# Patient Record
Sex: Male | Born: 1960 | Race: White | Hispanic: No | Marital: Married | Smoking: Former smoker
Health system: Southern US, Community
[De-identification: ages and names within clinical notes are randomized; demographics above are authoritative.]

## PROBLEM LIST (undated history)

## (undated) DIAGNOSIS — B1920 Unspecified viral hepatitis C without hepatic coma: Secondary | ICD-10-CM

## (undated) DIAGNOSIS — R2689 Other abnormalities of gait and mobility: Secondary | ICD-10-CM

## (undated) DIAGNOSIS — E039 Hypothyroidism, unspecified: Secondary | ICD-10-CM

## (undated) DIAGNOSIS — K7460 Unspecified cirrhosis of liver: Secondary | ICD-10-CM

## (undated) DIAGNOSIS — J9621 Acute and chronic respiratory failure with hypoxia: Secondary | ICD-10-CM

## (undated) DIAGNOSIS — K7210 Chronic hepatic failure without coma: Secondary | ICD-10-CM

## (undated) DIAGNOSIS — Z8673 Personal history of transient ischemic attack (TIA), and cerebral infarction without residual deficits: Secondary | ICD-10-CM

## (undated) DIAGNOSIS — I219 Acute myocardial infarction, unspecified: Secondary | ICD-10-CM

## (undated) DIAGNOSIS — G8929 Other chronic pain: Secondary | ICD-10-CM

## (undated) DIAGNOSIS — K56600 Partial intestinal obstruction, unspecified as to cause: Secondary | ICD-10-CM

## (undated) DIAGNOSIS — R293 Abnormal posture: Secondary | ICD-10-CM

## (undated) DIAGNOSIS — J449 Chronic obstructive pulmonary disease, unspecified: Secondary | ICD-10-CM

## (undated) DIAGNOSIS — M545 Low back pain, unspecified: Secondary | ICD-10-CM

## (undated) DIAGNOSIS — Z9289 Personal history of other medical treatment: Secondary | ICD-10-CM

## (undated) DIAGNOSIS — M1990 Unspecified osteoarthritis, unspecified site: Secondary | ICD-10-CM

## (undated) DIAGNOSIS — B182 Chronic viral hepatitis C: Secondary | ICD-10-CM

## (undated) DIAGNOSIS — Z9981 Dependence on supplemental oxygen: Secondary | ICD-10-CM

## (undated) DIAGNOSIS — F32A Depression, unspecified: Secondary | ICD-10-CM

## (undated) DIAGNOSIS — J45901 Unspecified asthma with (acute) exacerbation: Secondary | ICD-10-CM

## (undated) DIAGNOSIS — G43909 Migraine, unspecified, not intractable, without status migrainosus: Secondary | ICD-10-CM

## (undated) DIAGNOSIS — I2699 Other pulmonary embolism without acute cor pulmonale: Secondary | ICD-10-CM

## (undated) DIAGNOSIS — E538 Deficiency of other specified B group vitamins: Secondary | ICD-10-CM

## (undated) DIAGNOSIS — K922 Gastrointestinal hemorrhage, unspecified: Secondary | ICD-10-CM

## (undated) DIAGNOSIS — C229 Malignant neoplasm of liver, not specified as primary or secondary: Secondary | ICD-10-CM

## (undated) DIAGNOSIS — R0602 Shortness of breath: Secondary | ICD-10-CM

## (undated) DIAGNOSIS — K219 Gastro-esophageal reflux disease without esophagitis: Secondary | ICD-10-CM

## (undated) DIAGNOSIS — I10 Essential (primary) hypertension: Secondary | ICD-10-CM

## (undated) DIAGNOSIS — IMO0001 Reserved for inherently not codable concepts without codable children: Secondary | ICD-10-CM

## (undated) DIAGNOSIS — Z789 Other specified health status: Secondary | ICD-10-CM

## (undated) DIAGNOSIS — F329 Major depressive disorder, single episode, unspecified: Secondary | ICD-10-CM

## (undated) DIAGNOSIS — F419 Anxiety disorder, unspecified: Secondary | ICD-10-CM

## (undated) DIAGNOSIS — M6281 Muscle weakness (generalized): Secondary | ICD-10-CM

## (undated) DIAGNOSIS — I639 Cerebral infarction, unspecified: Secondary | ICD-10-CM

## (undated) DIAGNOSIS — J189 Pneumonia, unspecified organism: Secondary | ICD-10-CM

## (undated) DIAGNOSIS — J45909 Unspecified asthma, uncomplicated: Secondary | ICD-10-CM

## (undated) DIAGNOSIS — I82409 Acute embolism and thrombosis of unspecified deep veins of unspecified lower extremity: Secondary | ICD-10-CM

## (undated) DIAGNOSIS — A0472 Enterocolitis due to Clostridium difficile, not specified as recurrent: Secondary | ICD-10-CM

## (undated) DIAGNOSIS — G894 Chronic pain syndrome: Secondary | ICD-10-CM

## (undated) HISTORY — DX: Abnormal posture: R29.3

## (undated) HISTORY — DX: Other abnormalities of gait and mobility: R26.89

## (undated) HISTORY — DX: Cerebral infarction, unspecified: I63.9

## (undated) HISTORY — PX: NECK SURGERY: SHX720

## (undated) HISTORY — DX: Gastrointestinal hemorrhage, unspecified: K92.2

## (undated) HISTORY — PX: KNEE SURGERY: SHX244

## (undated) HISTORY — DX: Unspecified cirrhosis of liver: K74.60

## (undated) HISTORY — PX: TRANSURETHRAL RESECTION OF PROSTATE: SHX73

## (undated) HISTORY — DX: Chronic viral hepatitis C: B18.2

## (undated) HISTORY — DX: Chronic pain syndrome: G89.4

## (undated) HISTORY — DX: Acute and chronic respiratory failure with hypoxia: J96.21

## (undated) HISTORY — DX: Personal history of transient ischemic attack (TIA), and cerebral infarction without residual deficits: Z86.73

## (undated) HISTORY — DX: Muscle weakness (generalized): M62.81

## (undated) HISTORY — DX: Unspecified asthma with (acute) exacerbation: J45.901

---
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
## 2005-11-07 ENCOUNTER — Inpatient Hospital Stay (HOSPITAL_COMMUNITY)
Admission: EM | Admit: 2005-11-07 | Discharge: 2005-11-09 | Payer: Self-pay | Source: Ambulatory Visit | Admitting: Psychiatry

## 2005-11-07 ENCOUNTER — Ambulatory Visit: Payer: Self-pay | Admitting: Psychiatry

## 2009-10-01 DIAGNOSIS — A0472 Enterocolitis due to Clostridium difficile, not specified as recurrent: Secondary | ICD-10-CM

## 2009-10-01 DIAGNOSIS — B1920 Unspecified viral hepatitis C without hepatic coma: Secondary | ICD-10-CM

## 2009-10-01 HISTORY — DX: Enterocolitis due to Clostridium difficile, not specified as recurrent: A04.72

## 2009-10-01 HISTORY — DX: Unspecified viral hepatitis C without hepatic coma: B19.20

## 2010-11-30 HISTORY — PX: MULTIPLE TOOTH EXTRACTIONS: SHX2053

## 2010-12-08 ENCOUNTER — Encounter (HOSPITAL_COMMUNITY)
Admission: RE | Admit: 2010-12-08 | Discharge: 2010-12-08 | Disposition: A | Discharge status: Home / Self Care | Payer: Medicaid Other | Source: Ambulatory Visit | Attending: Oral Surgery | Admitting: Oral Surgery

## 2010-12-08 ENCOUNTER — Ambulatory Visit (HOSPITAL_COMMUNITY)
Admission: RE | Admit: 2010-12-08 | Discharge: 2010-12-08 | Disposition: A | Discharge status: Home / Self Care | Payer: Medicaid Other | Source: Ambulatory Visit | Attending: Oral Surgery | Admitting: Oral Surgery

## 2010-12-08 ENCOUNTER — Other Ambulatory Visit (HOSPITAL_COMMUNITY): Payer: Self-pay | Admitting: Oral Surgery

## 2010-12-08 DIAGNOSIS — Z01810 Encounter for preprocedural cardiovascular examination: Secondary | ICD-10-CM | POA: Insufficient documentation

## 2010-12-08 DIAGNOSIS — Z01811 Encounter for preprocedural respiratory examination: Secondary | ICD-10-CM

## 2010-12-08 DIAGNOSIS — Z01818 Encounter for other preprocedural examination: Secondary | ICD-10-CM | POA: Insufficient documentation

## 2010-12-08 DIAGNOSIS — Z01812 Encounter for preprocedural laboratory examination: Secondary | ICD-10-CM | POA: Insufficient documentation

## 2010-12-08 LAB — CBC
HCT: 39.8 % (ref 39.0–52.0)
MCH: 30.5 pg (ref 26.0–34.0)
MCV: 87.5 fL (ref 78.0–100.0)
Platelets: 553 10*3/uL — ABNORMAL HIGH (ref 150–400)
RBC: 4.55 MIL/uL (ref 4.22–5.81)
WBC: 10.6 10*3/uL — ABNORMAL HIGH (ref 4.0–10.5)

## 2010-12-08 LAB — COMPREHENSIVE METABOLIC PANEL
ALT: 91 U/L — ABNORMAL HIGH (ref 0–53)
AST: 105 U/L — ABNORMAL HIGH (ref 0–37)
Albumin: 3.9 g/dL (ref 3.5–5.2)
Alkaline Phosphatase: 92 U/L (ref 39–117)
CO2: 29 mEq/L (ref 19–32)
Chloride: 101 mEq/L (ref 96–112)
GFR calc Af Amer: >60 mL/min (ref >60)
GFR calc non Af Amer: >60 mL/min (ref >60)
Potassium: 4.5 mEq/L (ref 3.5–5.1)
Total Bilirubin: 0.8 mg/dL (ref 0.3–1.2)

## 2010-12-08 LAB — APTT: aPTT: 26 seconds (ref 24–37)

## 2010-12-11 ENCOUNTER — Ambulatory Visit (HOSPITAL_COMMUNITY)
Admission: RE | Admit: 2010-12-11 | Discharge: 2010-12-11 | Disposition: A | Discharge status: Home / Self Care | Payer: Medicaid Other | Source: Ambulatory Visit | Attending: Oral Surgery | Admitting: Oral Surgery

## 2010-12-11 DIAGNOSIS — B1920 Unspecified viral hepatitis C without hepatic coma: Secondary | ICD-10-CM | POA: Insufficient documentation

## 2010-12-11 DIAGNOSIS — M278 Other specified diseases of jaws: Secondary | ICD-10-CM | POA: Insufficient documentation

## 2010-12-11 DIAGNOSIS — J4489 Other specified chronic obstructive pulmonary disease: Secondary | ICD-10-CM | POA: Insufficient documentation

## 2010-12-11 DIAGNOSIS — J449 Chronic obstructive pulmonary disease, unspecified: Secondary | ICD-10-CM | POA: Insufficient documentation

## 2010-12-11 DIAGNOSIS — K029 Dental caries, unspecified: Secondary | ICD-10-CM | POA: Insufficient documentation

## 2010-12-11 DIAGNOSIS — I10 Essential (primary) hypertension: Secondary | ICD-10-CM | POA: Insufficient documentation

## 2010-12-11 LAB — HEPATIC FUNCTION PANEL
ALT: 59 U/L — ABNORMAL HIGH (ref 0–53)
AST: 69 U/L — ABNORMAL HIGH (ref 0–37)
Albumin: 3.6 g/dL (ref 3.5–5.2)
Alkaline Phosphatase: 75 U/L (ref 39–117)
Total Bilirubin: 0.6 mg/dL (ref 0.3–1.2)

## 2010-12-13 NOTE — Op Note (Signed)
NAME:  Robert [name], Robert [name]                ACCOUNT NO.:  [id number]  MEDICAL RECORD NO.:  [id number]           PATIENT TYPE:  O  LOCATION:  SDSC                         FACILITY:  [facility]  PHYSICIAN:  Georgia Lopes, M.D.  DATE OF BIRTH:  [birth year]  DATE OF PROCEDURE:  12/11/2010 DATE OF DISCHARGE:  12/11/2010                              OPERATIVE REPORT   PREOPERATIVE DIAGNOSES:  Dental caries and nonrestorable teeth numbers 2, 5, 6, 7, 8, 9, 10, 11, 21, 14, 18,19, 20, 21, 22, 23, 24, 25, 26, 27, 28, and 30.  POSTOPERATIVE DIAGNOSES:  Dental caries and nonrestorable teeth numbers 2, 5, 6, 7, 8, 9, 10, 11, 21, 14, 18,19, 20, 21, 22, 23, 24, 25, 26, 27, 28, and 30, plus lingual torus of left mandible.  PROCEDURE:  Removal of teeth as numbered above, alveoplasty of maxilla- mandible, and removal of left mandibular lingual torus.  SURGEON:  Georgia Lopes, MD  ANESTHESIA:  General, nasal.  ASSISTANTS: 1. Luberta Mutter, DOMA 2. Marlana Latus, DOMA  INDICATIONS FOR PROCEDURE:  Robert [name] is a 50 year old male who is referred to me by his general dentist for removal of all remaining teeth due to dental caries.  He has significant past medical history for hypertension, angina, COPD, Hep C, renal disease, and ulcers.  He received medical clearance from his physician, Dr. Feliciana Rossetti for procedure.  Because of the significance of the procedure and number of teeth to be removed, it was felt that general anesthesia was required. Therefore, the patient was scheduled at [facility].  PROCEDURE:  The patient was taken not to the operating room and placed on the table in supine position.  General anesthesia was administered intravenously and nasal endotracheal tube was placed and secured.  The eyes were protected.  The patient was draped for the procedure.  The posterior pharynx was suctioned.  A throat pack was placed.  Lidocaine 2% with 1:100,000 epinephrine was infiltrated in an  inferior alveolar block on the right and left side and buccal and palatal infiltration of the maxilla around teeth to be removed, a total of 17 mL was utilized. A bite block was placed in the right side of the mouth and the left side was operated first.  A #15 blade was used to make full-thickness incision around teeth numbers 18, 19, 20, 21, 22, 23, 24, 25, and 26 and the mandible and the maxilla around teeth numbers 7, 8, 9, 10, 11, 12, and 14.  The periosteum was reflected buccally and palatally in the maxilla and buccally and lingually in the mandible.  Bone was removed around the teeth using the Stryker handpiece with a fissure bur under irrigation.  Then, the teeth were elevated and removed from the mouth with the Asch forceps in the mandible and the #150 forceps in the maxilla.  Other sockets were then curetted and then the periosteum was further reflected to expose the bone in the maxilla and then in the mandible.  Then, the egg-shaped bur was used to perform an alveoplasty in the maxilla and mandible.  There was a lingual torus discovered upon  lingual reflection of the flap in the mandible and this torus was removed using a Stryker handpiece with irrigation.  Bone was collected on the [place] retractor during alveoplasty and this bone debris was placed in the socket in the area of tooth #11 as part of the buccal plate had fractured in this area.  Then, the areas of maxilla and mandible were closed with 3-0 chromic.  Bite block was repositioned and attention was turned to the right side of the mouth.  A #15 blade was used to make a full-thickness incision beginning at tooth #2 in the maxilla and curetting anteriorly to tooth #6 and then the mandible.  An incision was made around tooth #27 and carrying distally to tooth #30. The periosteum was reflected in the maxilla and mandible and then bone was removed interproximally and around teeth to be removed.  The teeth were elevated  with 301 elevator and removed from the mouth with the dental forceps.  Sockets were curetted and then periosteum was further reflected to expose the bone in the maxilla and mandible and egg-shaped bur was used to perform alveoplasty in the upper and lower jaws.  Bone was again collected on the [place] retractor as the alveoplasty was performed and this bone was harvested and placed in the socket of tooth #6 as part of the buccal plate fractured during removal of this tooth. Then, the areas were sutured with 3-0 chromic.  The oral cavity was inspected and found to have good contour and closure.  The oral cavity was irrigated and suctioned, and throat pack was removed.  The patient was awakened and taken to recovery breathing spontaneously in good condition.  ESTIMATED BLOOD LOSS:  Minimum.  COMPLICATIONS:  None.  SPECIMENS:  None.     Georgia Lopes, M.D.     SMJ/MEDQ  D:  12/11/2010  T:  12/12/2010  Job:  [id number]  Electronically Signed by Ocie Doyne M.D. on 12/13/2010 08:00:11 AM

## 2011-10-02 DIAGNOSIS — Z9289 Personal history of other medical treatment: Secondary | ICD-10-CM

## 2011-10-02 HISTORY — DX: Personal history of other medical treatment: Z92.89

## 2012-04-17 ENCOUNTER — Inpatient Hospital Stay (HOSPITAL_COMMUNITY): Payer: Medicaid Other

## 2012-04-17 ENCOUNTER — Observation Stay (HOSPITAL_COMMUNITY)
Admission: EM | Admit: 2012-04-17 | Discharge: 2012-04-20 | DRG: 074 | Disposition: A | Discharge status: Home Health | Payer: Medicaid Other | Attending: Internal Medicine | Admitting: Internal Medicine

## 2012-04-17 ENCOUNTER — Emergency Department (HOSPITAL_COMMUNITY): Payer: Medicaid Other

## 2012-04-17 ENCOUNTER — Encounter (HOSPITAL_COMMUNITY): Payer: Self-pay | Admitting: *Deleted

## 2012-04-17 DIAGNOSIS — M549 Dorsalgia, unspecified: Secondary | ICD-10-CM | POA: Diagnosis present

## 2012-04-17 DIAGNOSIS — K56600 Partial intestinal obstruction, unspecified as to cause: Secondary | ICD-10-CM

## 2012-04-17 DIAGNOSIS — M79609 Pain in unspecified limb: Secondary | ICD-10-CM

## 2012-04-17 DIAGNOSIS — M79604 Pain in right leg: Secondary | ICD-10-CM | POA: Diagnosis present

## 2012-04-17 DIAGNOSIS — C787 Secondary malignant neoplasm of liver and intrahepatic bile duct: Secondary | ICD-10-CM | POA: Diagnosis present

## 2012-04-17 DIAGNOSIS — I2699 Other pulmonary embolism without acute cor pulmonale: Secondary | ICD-10-CM | POA: Diagnosis present

## 2012-04-17 DIAGNOSIS — D72829 Elevated white blood cell count, unspecified: Secondary | ICD-10-CM | POA: Diagnosis present

## 2012-04-17 DIAGNOSIS — G620 Drug-induced polyneuropathy: Principal | ICD-10-CM | POA: Diagnosis present

## 2012-04-17 DIAGNOSIS — Z86711 Personal history of pulmonary embolism: Secondary | ICD-10-CM

## 2012-04-17 DIAGNOSIS — Z9181 History of falling: Secondary | ICD-10-CM

## 2012-04-17 DIAGNOSIS — M545 Low back pain, unspecified: Secondary | ICD-10-CM

## 2012-04-17 DIAGNOSIS — G8929 Other chronic pain: Secondary | ICD-10-CM | POA: Diagnosis present

## 2012-04-17 DIAGNOSIS — F17200 Nicotine dependence, unspecified, uncomplicated: Secondary | ICD-10-CM | POA: Diagnosis present

## 2012-04-17 DIAGNOSIS — K560 Paralytic ileus: Secondary | ICD-10-CM

## 2012-04-17 DIAGNOSIS — C229 Malignant neoplasm of liver, not specified as primary or secondary: Secondary | ICD-10-CM | POA: Insufficient documentation

## 2012-04-17 DIAGNOSIS — T451X5A Adverse effect of antineoplastic and immunosuppressive drugs, initial encounter: Secondary | ICD-10-CM | POA: Diagnosis present

## 2012-04-17 DIAGNOSIS — T4275XA Adverse effect of unspecified antiepileptic and sedative-hypnotic drugs, initial encounter: Secondary | ICD-10-CM | POA: Diagnosis present

## 2012-04-17 DIAGNOSIS — J449 Chronic obstructive pulmonary disease, unspecified: Secondary | ICD-10-CM | POA: Diagnosis present

## 2012-04-17 DIAGNOSIS — J4489 Other specified chronic obstructive pulmonary disease: Secondary | ICD-10-CM | POA: Diagnosis present

## 2012-04-17 DIAGNOSIS — B1920 Unspecified viral hepatitis C without hepatic coma: Secondary | ICD-10-CM | POA: Diagnosis present

## 2012-04-17 DIAGNOSIS — Y92009 Unspecified place in unspecified non-institutional (private) residence as the place of occurrence of the external cause: Secondary | ICD-10-CM

## 2012-04-17 DIAGNOSIS — J811 Chronic pulmonary edema: Secondary | ICD-10-CM

## 2012-04-17 DIAGNOSIS — Z7901 Long term (current) use of anticoagulants: Secondary | ICD-10-CM

## 2012-04-17 DIAGNOSIS — Z86718 Personal history of other venous thrombosis and embolism: Secondary | ICD-10-CM

## 2012-04-17 DIAGNOSIS — K5909 Other constipation: Secondary | ICD-10-CM | POA: Diagnosis present

## 2012-04-17 DIAGNOSIS — C3490 Malignant neoplasm of unspecified part of unspecified bronchus or lung: Secondary | ICD-10-CM

## 2012-04-17 HISTORY — DX: Depression, unspecified: F32.A

## 2012-04-17 HISTORY — DX: Partial intestinal obstruction, unspecified as to cause: K56.600

## 2012-04-17 HISTORY — DX: Major depressive disorder, single episode, unspecified: F32.9

## 2012-04-17 HISTORY — DX: Migraine, unspecified, not intractable, without status migrainosus: G43.909

## 2012-04-17 HISTORY — DX: Chronic hepatic failure without coma: K72.10

## 2012-04-17 HISTORY — DX: Acute myocardial infarction, unspecified: I21.9

## 2012-04-17 HISTORY — DX: Unspecified osteoarthritis, unspecified site: M19.90

## 2012-04-17 HISTORY — DX: Reserved for inherently not codable concepts without codable children: IMO0001

## 2012-04-17 HISTORY — DX: Unspecified viral hepatitis C without hepatic coma: B19.20

## 2012-04-17 HISTORY — DX: Chronic obstructive pulmonary disease, unspecified: J44.9

## 2012-04-17 HISTORY — DX: Dependence on supplemental oxygen: Z99.81

## 2012-04-17 HISTORY — DX: Personal history of other medical treatment: Z92.89

## 2012-04-17 HISTORY — DX: Cerebral infarction, unspecified: I63.9

## 2012-04-17 HISTORY — DX: Shortness of breath: R06.02

## 2012-04-17 HISTORY — DX: Acute embolism and thrombosis of unspecified deep veins of unspecified lower extremity: I82.409

## 2012-04-17 HISTORY — DX: Other pulmonary embolism without acute cor pulmonale: I26.99

## 2012-04-17 HISTORY — DX: Malignant neoplasm of liver, not specified as primary or secondary: C22.9

## 2012-04-17 LAB — URINALYSIS, ROUTINE W REFLEX MICROSCOPIC
Ketones, ur: NEGATIVE mg/dL
Nitrite: NEGATIVE
Specific Gravity, Urine: 1.003 — ABNORMAL LOW (ref 1.005–1.030)
Urobilinogen, UA: 0.2 mg/dL (ref 0.0–1.0)
pH: 6.5 (ref 5.0–8.0)

## 2012-04-17 LAB — CBC
HCT: 37.9 % — ABNORMAL LOW (ref 39.0–52.0)
Hemoglobin: 12.3 g/dL — ABNORMAL LOW (ref 13.0–17.0)
MCH: 31.6 pg (ref 26.0–34.0)
MCHC: 32.5 g/dL (ref 30.0–36.0)
MCV: 97.4 fL (ref 78.0–100.0)

## 2012-04-17 LAB — COMPREHENSIVE METABOLIC PANEL
BUN: 4 mg/dL — ABNORMAL LOW (ref 6–23)
CO2: 28 mEq/L (ref 19–32)
Calcium: 9.3 mg/dL (ref 8.4–10.5)
Creatinine, Ser: 0.71 mg/dL (ref 0.50–1.35)
GFR calc Af Amer: >90 mL/min (ref >90)
GFR calc non Af Amer: >90 mL/min (ref >90)
Glucose, Bld: 96 mg/dL (ref 70–99)
Total Protein: 6.7 g/dL (ref 6.0–8.3)

## 2012-04-17 LAB — PROTIME-INR: Prothrombin Time: 19.4 seconds — ABNORMAL HIGH (ref 11.6–15.2)

## 2012-04-17 LAB — URINE MICROSCOPIC-ADD ON

## 2012-04-17 MED ORDER — HYDROMORPHONE HCL PF 1 MG/ML IJ SOLN
1.0000 mg | Freq: Once | INTRAMUSCULAR | Status: AC
  Administered 2012-04-17: 1 mg via INTRAVENOUS
  Filled 2012-04-17: qty 1

## 2012-04-17 MED ORDER — ENOXAPARIN SODIUM 100 MG/ML ~~LOC~~ SOLN
1.0000 mg/kg | Freq: Once | SUBCUTANEOUS | Status: DC
  Administered 2012-04-17: 70 mg via SUBCUTANEOUS
  Filled 2012-04-17: qty 1

## 2012-04-17 MED ORDER — SENNOSIDES-DOCUSATE SODIUM 8.6-50 MG PO TABS
2.0000 | ORAL_TABLET | Freq: Every day | ORAL | Status: DC
  Administered 2012-04-17 – 2012-04-19 (×3): 2 via ORAL
  Filled 2012-04-17 (×3): qty 2

## 2012-04-17 MED ORDER — OXYCODONE HCL 15 MG PO TB12
15.0000 mg | ORAL_TABLET | Freq: Two times a day (BID) | ORAL | Status: DC
  Administered 2012-04-17 – 2012-04-18 (×3): 15 mg via ORAL
  Administered 2012-04-19: 10 mg via ORAL
  Filled 2012-04-17 (×3): qty 1

## 2012-04-17 MED ORDER — WARFARIN SODIUM 10 MG PO TABS
10.0000 mg | ORAL_TABLET | Freq: Once | ORAL | Status: AC
  Administered 2012-04-17: 10 mg via ORAL
  Filled 2012-04-17: qty 1

## 2012-04-17 MED ORDER — PANTOPRAZOLE SODIUM 40 MG PO TBEC
40.0000 mg | DELAYED_RELEASE_TABLET | Freq: Every day | ORAL | Status: DC
  Administered 2012-04-17 – 2012-04-20 (×4): 40 mg via ORAL
  Filled 2012-04-17 (×4): qty 1

## 2012-04-17 MED ORDER — ZOLPIDEM TARTRATE 5 MG PO TABS: 10.0000 mg | ORAL_TABLET | Freq: Every evening | ORAL | Status: DC | PRN

## 2012-04-17 MED ORDER — OLANZAPINE 5 MG PO TABS
5.0000 mg | ORAL_TABLET | Freq: Every day | ORAL | Status: DC
Start: 2012-04-17 — End: 2012-04-20
  Administered 2012-04-17 – 2012-04-19 (×3): 5 mg via ORAL
  Filled 2012-04-17 (×4): qty 1

## 2012-04-17 MED ORDER — ACETAMINOPHEN 325 MG PO TABS
650.0000 mg | ORAL_TABLET | Freq: Four times a day (QID) | ORAL | Status: DC | PRN
  Filled 2012-04-17: qty 2

## 2012-04-17 MED ORDER — ACETAMINOPHEN 650 MG RE SUPP: 650.0000 mg | Freq: Four times a day (QID) | RECTAL | Status: DC | PRN

## 2012-04-17 MED ORDER — POTASSIUM CHLORIDE CRYS ER 20 MEQ PO TBCR
20.0000 meq | EXTENDED_RELEASE_TABLET | Freq: Every day | ORAL | Status: DC
  Administered 2012-04-17 – 2012-04-20 (×4): 20 meq via ORAL
  Filled 2012-04-17 (×4): qty 1

## 2012-04-17 MED ORDER — WARFARIN SODIUM 7.5 MG PO TABS: 7.5000 mg | ORAL_TABLET | Freq: Every day | ORAL | Status: DC

## 2012-04-17 MED ORDER — SODIUM CHLORIDE 0.9 % IV BOLUS (SEPSIS)
500.0000 mL | Freq: Once | INTRAVENOUS | Status: AC
  Administered 2012-04-17: 500 mL via INTRAVENOUS

## 2012-04-17 MED ORDER — IOHEXOL 300 MG/ML  SOLN
80.0000 mL | Freq: Once | INTRAMUSCULAR | Status: AC | PRN
  Administered 2012-04-17: 80 mL via INTRAVENOUS

## 2012-04-17 MED ORDER — HYDROMORPHONE HCL PF 1 MG/ML IJ SOLN
1.0000 mg | INTRAMUSCULAR | Status: DC | PRN
  Administered 2012-04-20: 1 mg via INTRAVENOUS
  Filled 2012-04-17: qty 1

## 2012-04-17 MED ORDER — WARFARIN - PHARMACIST DOSING INPATIENT
Freq: Every day | Status: DC
  Administered 2012-04-19: 18:00:00

## 2012-04-17 MED ORDER — DULOXETINE HCL 60 MG PO CPEP
60.0000 mg | ORAL_CAPSULE | Freq: Two times a day (BID) | ORAL | Status: DC
  Administered 2012-04-17 – 2012-04-20 (×6): 60 mg via ORAL
  Filled 2012-04-17 (×7): qty 1

## 2012-04-17 MED ORDER — OXYCODONE HCL 5 MG PO TABS
30.0000 mg | ORAL_TABLET | Freq: Four times a day (QID) | ORAL | Status: DC | PRN
  Administered 2012-04-18 – 2012-04-20 (×5): 30 mg via ORAL
  Filled 2012-04-17 (×5): qty 6

## 2012-04-17 MED ORDER — TAMSULOSIN HCL 0.4 MG PO CAPS
0.8000 mg | ORAL_CAPSULE | Freq: Every day | ORAL | Status: DC
  Administered 2012-04-17 – 2012-04-19 (×3): 0.8 mg via ORAL
  Filled 2012-04-17 (×4): qty 2

## 2012-04-17 MED ORDER — SODIUM CHLORIDE 0.9 % IV SOLN
INTRAVENOUS | Status: AC
  Administered 2012-04-17: 19:00:00 via INTRAVENOUS

## 2012-04-17 MED ORDER — HYDROCODONE-ACETAMINOPHEN 10-325 MG PO TABS
1.0000 | ORAL_TABLET | Freq: Four times a day (QID) | ORAL | Status: DC
  Administered 2012-04-17 – 2012-04-20 (×10): 1 via ORAL
  Filled 2012-04-17 (×11): qty 1

## 2012-04-17 MED ORDER — POLYETHYLENE GLYCOL 3350 17 G PO PACK
17.0000 g | PACK | Freq: Two times a day (BID) | ORAL | Status: DC
  Administered 2012-04-17 – 2012-04-20 (×5): 17 g via ORAL
  Filled 2012-04-17 (×7): qty 1

## 2012-04-17 MED ORDER — QUETIAPINE FUMARATE 200 MG PO TABS
400.0000 mg | ORAL_TABLET | Freq: Every day | ORAL | Status: DC
  Administered 2012-04-17 – 2012-04-19 (×3): 400 mg via ORAL
  Filled 2012-04-17 (×4): qty 2

## 2012-04-17 MED ORDER — ONDANSETRON HCL 4 MG/2ML IJ SOLN
4.0000 mg | Freq: Once | INTRAMUSCULAR | Status: AC
  Administered 2012-04-17: 4 mg via INTRAVENOUS
  Filled 2012-04-17: qty 2

## 2012-04-17 MED ORDER — ONDANSETRON HCL 4 MG/2ML IJ SOLN: 4.0000 mg | Freq: Four times a day (QID) | INTRAMUSCULAR | Status: DC | PRN

## 2012-04-17 MED ORDER — FLEET ENEMA 7-19 GM/118ML RE ENEM
1.0000 | ENEMA | Freq: Once | RECTAL | Status: AC
  Administered 2012-04-17: 22:00:00 via RECTAL
  Filled 2012-04-17: qty 1

## 2012-04-17 MED ORDER — GABAPENTIN 300 MG PO CAPS
300.0000 mg | ORAL_CAPSULE | Freq: Four times a day (QID) | ORAL | Status: DC
  Administered 2012-04-17 – 2012-04-20 (×11): 300 mg via ORAL
  Filled 2012-04-17 (×13): qty 1

## 2012-04-17 MED ORDER — IOHEXOL 300 MG/ML  SOLN
20.0000 mL | INTRAMUSCULAR | Status: AC
  Administered 2012-04-17 (×2): 20 mL via ORAL

## 2012-04-17 NOTE — H&P (Signed)
PCP:   Quentin Mulling, MD   Chief Complaint:  Right leg and back pain  HPI: Robert [name] is a 51 year old male with history of chronic liver failure, metastatic lung cancer, DVT/PE on Coumadin, COPD, chronic back pain who was discharged from hospice care 2 months ago presents to the ER with the above-mentioned complaint . Patient is a very poor historian, reports ongoing pain in his right thigh for over a week now , which he describes the pain as sharp, burning associated with some tingling, radiating up his lower back and hip .  in addition he also reports worsening of his chronic low back pain. He denies any incontinence of stool or urine. Denies any fevers or chills. History of frequent falls almost one per day for the last week. Upon evaluation the emergency room he was ruled out for DVT based on venous Dopplers, also had a CT abdomen pelvis done which showed nonspecific dilatation of proximal small bowel, however patient denies any abdominal pain nausea vomiting or even constipation. Reports having a bowel movement yesterday    Allergies:  No Known Allergies    Past Medical History  Diagnosis Date  . Deep vein thrombosis   . Pulmonary emboli   . Hepatitis C   . COPD (chronic obstructive pulmonary disease)   . Partial small bowel obstruction   . Liver cancer   . Lung cancer     History reviewed. No pertinent past surgical history.  Prior to Admission medications   Medication Sig Start Date End Date Taking? Authorizing Provider  cyanocobalamin (,VITAMIN B-12,) 1000 MCG/ML injection Inject 1,000 mcg into the muscle once a week.   Yes Historical Provider, MD  DULoxetine (CYMBALTA) 60 MG capsule Take 60 mg by mouth 2 (two) times daily.   Yes Historical Provider, MD  folic acid (FOLVITE) 1 MG tablet Take 1 mg by mouth daily.   Yes Historical Provider, MD  gabapentin (NEURONTIN) 300 MG capsule Take 300 mg by mouth 4 (four) times daily.   Yes Historical Provider, MD    HYDROcodone-acetaminophen (NORCO) 10-325 MG per tablet Take 2 tablets by mouth 4 (four) times daily.   Yes Historical Provider, MD  Multiple Vitamin (MULTIVITAMIN WITH MINERALS) TABS Take 1 tablet by mouth daily.   Yes Historical Provider, MD  OLANZapine (ZYPREXA) 5 MG tablet Take 5 mg by mouth at bedtime.   Yes Historical Provider, MD  omeprazole (PRILOSEC) 20 MG capsule Take 20 mg by mouth 2 (two) times daily.   Yes Historical Provider, MD  oxycodone (ROXICODONE) 30 MG immediate release tablet Take 30 mg by mouth every 4 (four) hours as needed. For pain   Yes Historical Provider, MD  potassium chloride SA (K-DUR,KLOR-CON) 20 MEQ tablet Take 20 mEq by mouth daily.   Yes Historical Provider, MD  QUEtiapine (SEROQUEL) 200 MG tablet Take 200-400 mg by mouth at bedtime.   Yes Historical Provider, MD  senna-docusate (SENOKOT-S) 8.6-50 MG per tablet Take 1-4 tablets by mouth daily as needed. Congestion   Yes Historical Provider, MD  Tamsulosin HCl (FLOMAX) 0.4 MG CAPS Take 0.8 mg by mouth at bedtime.   Yes Historical Provider, MD  warfarin (COUMADIN) 5 MG tablet Take 7.5 mg by mouth daily.   Yes Historical Provider, MD  zolpidem (AMBIEN) 10 MG tablet Take 20 mg by mouth at bedtime as needed. For sleep   Yes Historical Provider, MD    Social History: lives at home with his aunt, smokes half pack per day previously smoked 2  packs per day for over 30 years   reports that he has been smoking.  He does not have any smokeless tobacco history on file. He reports that he drinks alcohol. He reports that he does not use illicit drugs.  No family history on file.  Review of Systems:  Constitutional: Denies fever, chills, diaphoresis, appetite change and fatigue.  HEENT: Denies photophobia, eye pain, redness, hearing loss, ear pain, congestion, sore throat, rhinorrhea, sneezing, mouth sores, trouble swallowing, neck pain, neck stiffness and tinnitus.   Respiratory: Denies SOB, DOE, cough, chest tightness,  and  wheezing.   Cardiovascular: Denies chest pain, palpitations and leg swelling.  Gastrointestinal: Denies nausea, vomiting, abdominal pain, diarrhea, constipation, blood in stool and abdominal distention.  Genitourinary: Denies dysuria, urgency, frequency, hematuria, flank pain and difficulty urinating.  Musculoskeletal: Denies myalgias, back pain, joint swelling, arthralgias and gait problem.  Skin: Denies pallor, rash and wound.  Neurological: Denies dizziness, seizures, syncope, weakness, light-headedness, numbness and headaches.  Hematological: Denies adenopathy. Easy bruising, personal or family bleeding history  Psychiatric/Behavioral: Denies suicidal ideation, mood changes, confusion, nervousness, sleep disturbance and agitation   Physical Exam: Blood pressure 135/70, pulse 70, temperature 97.8 F (36.6 C), temperature source Oral, resp. rate 18, height 5\' 7"  (1.702 m), weight 68.04 kg (150 lb), SpO2 100.00%. General alert awake oriented x3 in no acute distress HEENT: Muddy sclera pupils small reactive to light, oral mucosa moist poor dental hygiene CVS S1-S2 regular rate rhythm no origins or gallops Lungs poor air movement bilaterally Abdomen soft slightly distended nontender no rigidity no rebound no CVA tenderness Extremities no edema clubbing or cyanosis Right thigh paresthesias or noted and some tenderness to deep palpation neuro moves all extremities no focal findings Skin: Erythematous patches, with scaling predominantly in both lower extremities   Labs on Admission:  Results for orders placed during the hospital encounter of 04/17/12 (from the past 48 hour(s))  PROTIME-INR     Status: Abnormal   Collection Time   04/17/12 11:44 AM      Component Value Range Comment   Prothrombin Time 19.4 (*) 11.6 - 15.2 seconds    INR 1.61 (*) 0.00 - 1.49   CBC     Status: Abnormal   Collection Time   04/17/12 11:44 AM      Component Value Range Comment   WBC 17.7 (*) 4.0 - 10.5 K/uL     RBC 3.89 (*) 4.22 - 5.81 MIL/uL    Hemoglobin 12.3 (*) 13.0 - 17.0 g/dL    HCT 16.1 (*) 09.6 - 52.0 %    MCV 97.4  78.0 - 100.0 fL    MCH 31.6  26.0 - 34.0 pg    MCHC 32.5  30.0 - 36.0 g/dL    RDW 04.5  40.9 - 81.1 %    Platelets 382  150 - 400 K/uL   COMPREHENSIVE METABOLIC PANEL     Status: Abnormal   Collection Time   04/17/12 11:44 AM      Component Value Range Comment   Sodium 141  135 - 145 mEq/L    Potassium 3.6  3.5 - 5.1 mEq/L    Chloride 102  96 - 112 mEq/L    CO2 28  19 - 32 mEq/L    Glucose, Bld 96  70 - 99 mg/dL    BUN 4 (*) 6 - 23 mg/dL    Creatinine, Ser 9.14  0.50 - 1.35 mg/dL    Calcium 9.3  8.4 - 78.2 mg/dL  Total Protein 6.7  6.0 - 8.3 g/dL    Albumin 3.2 (*) 3.5 - 5.2 g/dL    AST 17  0 - 37 U/L    ALT 6  0 - 53 U/L    Alkaline Phosphatase 58  39 - 117 U/L    Total Bilirubin 0.2 (*) 0.3 - 1.2 mg/dL    GFR calc non Af Amer >90  >90 mL/min    GFR calc Af Amer >90  >90 mL/min   URINALYSIS, ROUTINE W REFLEX MICROSCOPIC     Status: Abnormal   Collection Time   04/17/12  1:14 PM      Component Value Range Comment   Color, Urine YELLOW  YELLOW    APPearance CLEAR  CLEAR    Specific Gravity, Urine 1.003 (*) 1.005 - 1.030    pH 6.5  5.0 - 8.0    Glucose, UA NEGATIVE  NEGATIVE mg/dL    Hgb urine dipstick NEGATIVE  NEGATIVE    Bilirubin Urine NEGATIVE  NEGATIVE    Ketones, ur NEGATIVE  NEGATIVE mg/dL    Protein, ur NEGATIVE  NEGATIVE mg/dL    Urobilinogen, UA 0.2  0.0 - 1.0 mg/dL    Nitrite NEGATIVE  NEGATIVE    Leukocytes, UA SMALL (*) NEGATIVE   URINE MICROSCOPIC-ADD ON     Status: Normal   Collection Time   04/17/12  1:14 PM      Component Value Range Comment   Squamous Epithelial / LPF RARE  RARE    WBC, UA 3-6  <3 WBC/hpf    RBC / HPF 0-2  <3 RBC/hpf     Radiological Exams on Admission: Ct Abdomen Pelvis W Contrast  04/17/2012  *RADIOLOGY REPORT*  Clinical Data: History of deep venous thrombosis, cancer and liver disease.  Back pain and right leg  pain.  CT ABDOMEN AND PELVIS WITH CONTRAST  Technique:  Multidetector CT imaging of the abdomen and pelvis was performed following the standard protocol during bolus administration of intravenous contrast.  Contrast: 80mL OMNIPAQUE IOHEXOL 300 MG/ML  SOLN  Comparison: None.  Findings: Lung bases show mild dependent atelectasis.  Heart size normal.  No pericardial or pleural effusion.  A sub centimeter low attenuation lesion in the dome of the liver is too small to characterize.  Liver, gallbladder, adrenal glands, kidneys, spleen, pancreas and stomach are otherwise unremarkable. Mild dilatation of proximal small bowel without a discrete transition point.  Colon is unremarkable.  Bladder wall may be minimally thickened.  Small right inguinal hernia contains fat.  Atherosclerotic calcification of the arterial vasculature without abdominal aortic aneurysm.  No pathologically enlarged lymph nodes.  No free fluid.  No worrisome lytic or sclerotic lesions.  IMPRESSION:  1.  Mild nonspecific dilatation of proximal small bowel without a discrete transition point.  Partial small bowel obstruction cannot be excluded. 2.  Mild dependent atelectasis bilaterally.  Original Report Authenticated By: Reyes Ivan, M.D.    Assessment/Plan 1. Right thigh pain with acute and chronic low back pain Will check x-rays to rule out obvious metastatic lesions Dopplers of right lower extremity negative for DVT Suspect some of his right thigh pain is neuropathic from prior chemotherapy Continue home dose of narcotics, will add MS Contin for longer duration of effects Also request physical therapy evaluation  2. history of pulmonary emboli/DVT On Coumadin, questionable compliance with this   request pharmacy to dose Coumadin  Not transition to long-term Lovenox due to poor compliance/ patient not interested in continuing  Lovenox long-term   3. Metastatic lung cancer , originally treated at [facility] , last  chemotherapy over a year ago , was then referred to hospice Patient tells me that hospice give him 20 months to live, this was over a year ago   4. COPD: Stable  5.  Adynamic ileus/ chronic constipation secondary to narcotics   without any symptoms of bowel obstruction at this time   we'll start him on clear liquids, Miralax twice a day and enemas, repeat KUB in am  6. Leukocytosis: suspect related to cancer, no evidence of acute infection at this time  7. Prophylaxis: PPI, currently on Coumadin  CODE STATUS: Patient wishes to be a full code for " 72 hours" in his own words, he understands overall poor prognosis   Time Spent on Admission:  [facility] Pager: [phone] 04/17/2012, 5:33 PM

## 2012-04-17 NOTE — Progress Notes (Signed)
*  Preliminary Results* Right lower extremity venous duplex completed. Right lower extremity is negative for deep vein thrombosis.  04/17/2012 1:54 PM Gertie Fey, RDMS, RDCS

## 2012-04-17 NOTE — ED Notes (Signed)
Patient has hx of dvt and cancer and liver disease.  He is here today due to having back pain and right leg pain. He states the pain radiates up from the right knee to his hip.  He states the leg is numb.

## 2012-04-17 NOTE — ED Provider Notes (Signed)
History     CSN: [id number]  Arrival date & time 04/17/12  1011   First MD Initiated Contact with Patient 04/17/12 1257      Chief Complaint  Patient presents with  . Leg Pain  . Back Pain  . Numbness    (Consider location/radiation/quality/duration/timing/severity/associated sxs/prior treatment) HPI Pt with multiple medical problems reports he was admitted to [facility] about a week ago and found to have PE. He was already on coumadin and states they discharged him without any further management recommendations. He has had several days of increasing pain which he states starts in his R foot and radiates up his leg and into his R lower abdomen and around to his lower back. Pain is moderate to severe, aching and worse with movement. Associated with numbness in his R anterior thigh. No swelling or weakness. HE has known lung CA with mets to liver. Was previously on hospice but left them as well.  Past Medical History  Diagnosis Date  . Liver cancer   . Lung cancer   . Deep vein thrombosis   . Pulmonary emboli   . Hepatitis C   . COPD (chronic obstructive pulmonary disease)     History reviewed. No pertinent past surgical history.  No family history on file.  History  Substance Use Topics  . Smoking status: Current Everyday Smoker  . Smokeless tobacco: Not on file  . Alcohol Use: Yes      Review of Systems All other systems reviewed and are negative except as noted in HPI.   Allergies  Review of patient's allergies indicates no known allergies.  Home Medications   Current Outpatient Rx  Name Route Sig Dispense Refill  . CYANOCOBALAMIN 1000 MCG/ML IJ SOLN Intramuscular Inject 1,000 mcg into the muscle once a week.    . DULOXETINE HCL 60 MG PO CPEP Oral Take 60 mg by mouth 2 (two) times daily.    Marland Kitchen FOLIC ACID 1 MG PO TABS Oral Take 1 mg by mouth daily.    Marland Kitchen GABAPENTIN 300 MG PO CAPS Oral Take 300 mg by mouth 4 (four) times daily.    Marland Kitchen  HYDROCODONE-ACETAMINOPHEN 10-325 MG PO TABS Oral Take 2 tablets by mouth 4 (four) times daily.    . ADULT MULTIVITAMIN W/MINERALS CH Oral Take 1 tablet by mouth daily.    Marland Kitchen OLANZAPINE 5 MG PO TABS Oral Take 5 mg by mouth at bedtime.    . OMEPRAZOLE 20 MG PO CPDR Oral Take 20 mg by mouth 2 (two) times daily.    . OXYCODONE HCL 30 MG PO TABS Oral Take 30 mg by mouth every 4 (four) hours as needed. For pain    . POTASSIUM CHLORIDE CRYS ER 20 MEQ PO TBCR Oral Take 20 mEq by mouth daily.    . QUETIAPINE FUMARATE 200 MG PO TABS Oral Take 200-400 mg by mouth at bedtime.    Bernadette Hoit SODIUM 8.6-50 MG PO TABS Oral Take 1-4 tablets by mouth daily as needed. Congestion    . TAMSULOSIN HCL 0.4 MG PO CAPS Oral Take 0.8 mg by mouth at bedtime.    . WARFARIN SODIUM 5 MG PO TABS Oral Take 7.5 mg by mouth daily.    Marland Kitchen ZOLPIDEM TARTRATE 10 MG PO TABS Oral Take 20 mg by mouth at bedtime as needed. For sleep      BP 131/95  Pulse 85  Temp 97.8 F (36.6 C) (Oral)  Resp 20  SpO2 100%  Physical Exam  Nursing note and vitals reviewed. Constitutional: He is oriented to person, place, and time. He appears well-developed and well-nourished.  HENT:  Head: Normocephalic and atraumatic.  Eyes: EOM are normal. Pupils are equal, round, and reactive to light.  Neck: Normal range of motion. Neck supple.  Cardiovascular: Normal rate, normal heart sounds and intact distal pulses.   Pulmonary/Chest: Effort normal and breath sounds normal.  Abdominal: Bowel sounds are normal. He exhibits no distension. There is no tenderness.  Musculoskeletal: Normal range of motion. He exhibits no edema and no tenderness.  Neurological: He is alert and oriented to person, place, and time. He has normal strength. No cranial nerve deficit or sensory deficit.  Skin: Skin is warm and dry. No rash noted.  Psychiatric: He has a normal mood and affect.    ED Course  Procedures (including critical care time)  Labs Reviewed    PROTIME-INR - Abnormal; Notable for the following:    Prothrombin Time 19.4 (*)     INR 1.61 (*)     All other components within normal limits  CBC - Abnormal; Notable for the following:    WBC 17.7 (*)     RBC 3.89 (*)     Hemoglobin 12.3 (*)     HCT 37.9 (*)     All other components within normal limits  COMPREHENSIVE METABOLIC PANEL - Abnormal; Notable for the following:    BUN 4 (*)     Albumin 3.2 (*)     Total Bilirubin 0.2 (*)     All other components within normal limits  URINALYSIS, ROUTINE W REFLEX MICROSCOPIC - Abnormal; Notable for the following:    Specific Gravity, Urine 1.003 (*)     Leukocytes, UA SMALL (*)     All other components within normal limits  URINE MICROSCOPIC-ADD ON   Ct Abdomen Pelvis W Contrast  04/17/2012  *RADIOLOGY REPORT*  Clinical Data: History of deep venous thrombosis, cancer and liver disease.  Back pain and right leg pain.  CT ABDOMEN AND PELVIS WITH CONTRAST  Technique:  Multidetector CT imaging of the abdomen and pelvis was performed following the standard protocol during bolus administration of intravenous contrast.  Contrast: 80mL OMNIPAQUE IOHEXOL 300 MG/ML  SOLN  Comparison: None.  Findings: Lung bases show mild dependent atelectasis.  Heart size normal.  No pericardial or pleural effusion.  A sub centimeter low attenuation lesion in the dome of the liver is too small to characterize.  Liver, gallbladder, adrenal glands, kidneys, spleen, pancreas and stomach are otherwise unremarkable. Mild dilatation of proximal small bowel without a discrete transition point.  Colon is unremarkable.  Bladder wall may be minimally thickened.  Small right inguinal hernia contains fat.  Atherosclerotic calcification of the arterial vasculature without abdominal aortic aneurysm.  No pathologically enlarged lymph nodes.  No free fluid.  No worrisome lytic or sclerotic lesions.  IMPRESSION:  1.  Mild nonspecific dilatation of proximal small bowel without a discrete  transition point.  Partial small bowel obstruction cannot be excluded. 2.  Mild dependent atelectasis bilaterally.  Original Report Authenticated By: Reyes Ivan, M.D.     No diagnosis found.    MDM  Pt with partial SBO on CT. Also had recurrent PE on recent ED visit to [facility], told to increase coumadin and followup which he did not do. Will give Lovenox. Pain meds, clear liquids. Admit for further eval.         Charles B. Bernette Mayers, MD 04/17/12 [phone]

## 2012-04-17 NOTE — Progress Notes (Addendum)
Robert [name], is a 51 y.o. male,   MRN: [id number]  -  DOB - 1960/11/15  Outpatient Primary MD for the patient is Quentin Mulling, MD  Chief Complaint:    Chief Complaint  Patient presents with  . Leg Pain  . Back Pain  . Numbness     Blood pressure 132/103, pulse 77, temperature 97.8 F (36.6 C), temperature source Oral, resp. rate 18, height 5\' 7"  (1.702 m), weight 68.04 kg (150 lb), SpO2 100.00%.  Active Problems: 1.  Partial SBO shown on Xray. Patient on large doses of chronic narcotics. 2.  Back Pain that radiates down the right leg.  RLE Doppler was negative for DVT in ED today. 3.  History of DVT on coumadin 4.  Diagnosed with PE 1 week ago at [facility] (INR still sub therapeutic) 5.  Lung Cancer with METs  6.  H/O  Hepatitis C.  Will request Enemas, Clear liquids.  Lovenox given in ED.  Will ask pharmacy to manage coumadin. Tele (for 24 hours).  Inpatient admission.     Algis Downs, PA-C [facility] Pager: [phone]

## 2012-04-17 NOTE — Progress Notes (Signed)
ANTICOAGULATION CONSULT NOTE - Initial Consult  Pharmacy Consult for Warfarin Indication: VTE prophylaxis (history of DVT)  No Known Allergies  Patient Measurements: Height: 5\' 7"  (170.2 cm) Weight: 150 lb 1.8 oz (68.09 kg) IBW/kg (Calculated) : 66.1   Vital Signs: Temp: 97.6 F (36.4 C) (07/18 1750) Temp src: Oral (07/18 1750) BP: 135/95 mmHg (07/18 1750) Pulse Rate: 74  (07/18 1750)  Labs:  Basename 04/17/12 1144  HGB 12.3*  HCT 37.9*  PLT 382  APTT --  LABPROT 19.4*  INR 1.61*  HEPARINUNFRC --  CREATININE 0.71  CKTOTAL --  CKMB --  TROPONINI --    Estimated Creatinine Clearance: 103.3 ml/min (by C-G formula based on Cr of 0.71).   Medical History: Past Medical History  Diagnosis Date  . Deep vein thrombosis   . Pulmonary emboli   . Hepatitis C   . COPD (chronic obstructive pulmonary disease)   . Partial small bowel obstruction   . Liver cancer   . Lung cancer     Medications:  Scheduled:    . sodium chloride   Intravenous STAT  . DULoxetine  60 mg Oral BID  . gabapentin  300 mg Oral QID  . HYDROcodone-acetaminophen  1 tablet Oral QID  .  HYDROmorphone (DILAUDID) injection  1 mg Intravenous Once  .  HYDROmorphone (DILAUDID) injection  1 mg Intravenous Once  . iohexol  20 mL Oral Q1 Hr x 2  . OLANZapine  5 mg Oral QHS  . ondansetron  4 mg Intravenous Once  . oxyCODONE  15 mg Oral Q12H  . pantoprazole  40 mg Oral Q1200  . polyethylene glycol  17 g Oral BID  . potassium chloride SA  20 mEq Oral Daily  . QUEtiapine  400 mg Oral QHS  . senna-docusate  2 tablet Oral QHS  . sodium chloride  500 mL Intravenous Once  . sodium phosphate  1 enema Rectal Once  . Tamsulosin HCl  0.8 mg Oral QHS  . DISCONTD: enoxaparin  1 mg/kg Subcutaneous Once  . DISCONTD: warfarin  7.5 mg Oral Daily    Assessment: Robert [name] with history of DVT on Coumadin PTA (home regimen 7.5mg  daily) admitted 04/17/12 with right thigh pain. LE Dopplers in ED ruled out new DVT. One dose  of Lovenox ordered in ED was discontinued. Resuming Coumadin. INR 1.61 this pm which is slight sub-therapeutic for goal of 2-3. Last dose of Coumadin per patient was on 04/16/12. No Coumadin taken this pm. MD note does state questionable compliance with Coumadin. CBC ok. No bleeding documented.   Goal of Therapy:  INR 2-3   Plan:  1. Coumadin 10mg  this pm. 2. Daily PT/INR.  Link Snuffer, PharmD, BCPS Clinical Pharmacist [phone] 04/17/2012,6:27 PM

## 2012-04-18 ENCOUNTER — Encounter (HOSPITAL_COMMUNITY): Payer: Self-pay | Admitting: General Practice

## 2012-04-18 ENCOUNTER — Inpatient Hospital Stay (HOSPITAL_COMMUNITY): Payer: Medicaid Other

## 2012-04-18 DIAGNOSIS — M79609 Pain in unspecified limb: Secondary | ICD-10-CM

## 2012-04-18 DIAGNOSIS — M549 Dorsalgia, unspecified: Secondary | ICD-10-CM

## 2012-04-18 LAB — CBC
HCT: 33.8 % — ABNORMAL LOW (ref 39.0–52.0)
Hemoglobin: 10.9 g/dL — ABNORMAL LOW (ref 13.0–17.0)
MCH: 31.1 pg (ref 26.0–34.0)
MCHC: 32.2 g/dL (ref 30.0–36.0)
MCV: 96.6 fL (ref 78.0–100.0)
RBC: 3.5 MIL/uL — ABNORMAL LOW (ref 4.22–5.81)

## 2012-04-18 LAB — PROTIME-INR: Prothrombin Time: 26.7 seconds — ABNORMAL HIGH (ref 11.6–15.2)

## 2012-04-18 LAB — COMPREHENSIVE METABOLIC PANEL
ALT: 6 U/L (ref 0–53)
BUN: 4 mg/dL — ABNORMAL LOW (ref 6–23)
CO2: 25 mEq/L (ref 19–32)
Calcium: 8.2 mg/dL — ABNORMAL LOW (ref 8.4–10.5)
GFR calc Af Amer: >90 mL/min (ref >90)
GFR calc non Af Amer: >90 mL/min (ref >90)
Glucose, Bld: 89 mg/dL (ref 70–99)
Sodium: 146 mEq/L — ABNORMAL HIGH (ref 135–145)
Total Protein: 5.2 g/dL — ABNORMAL LOW (ref 6.0–8.3)

## 2012-04-18 MED ORDER — WARFARIN SODIUM 2 MG PO TABS
2.0000 mg | ORAL_TABLET | Freq: Once | ORAL | Status: AC
  Administered 2012-04-18: 2 mg via ORAL
  Filled 2012-04-18: qty 1

## 2012-04-18 NOTE — Progress Notes (Signed)
ANTICOAGULATION CONSULT NOTE - Initial Consult  Pharmacy Consult for Warfarin Indication: VTE prophylaxis (history of DVT)  No Known Allergies  Patient Measurements: Height: 5\' 7"  (170.2 cm) Weight: 150 lb 1.8 oz (68.09 kg) IBW/kg (Calculated) : 66.1   Vital Signs: Temp: 97.6 F (36.4 C) (07/19 0525) BP: 137/86 mmHg (07/19 0525) Pulse Rate: 74  (07/19 0525)  Labs:  Basename 04/18/12 0600 04/17/12 1144  HGB 10.9* 12.3*  HCT 33.8* 37.9*  PLT 333 382  APTT -- --  LABPROT 26.7* 19.4*  INR 2.42* 1.61*  HEPARINUNFRC -- --  CREATININE 0.68 0.71  CKTOTAL -- --  CKMB -- --  TROPONINI -- --    Estimated Creatinine Clearance: 103.3 ml/min (by C-G formula based on Cr of 0.68).   Medical History: Past Medical History  Diagnosis Date  . Deep vein thrombosis   . Pulmonary emboli   . Hepatitis C   . COPD (chronic obstructive pulmonary disease)   . Partial small bowel obstruction   . Liver cancer   . Lung cancer     Medications:  Scheduled:     . sodium chloride   Intravenous STAT  . DULoxetine  60 mg Oral BID  . gabapentin  300 mg Oral QID  . HYDROcodone-acetaminophen  1 tablet Oral QID  .  HYDROmorphone (DILAUDID) injection  1 mg Intravenous Once  .  HYDROmorphone (DILAUDID) injection  1 mg Intravenous Once  . iohexol  20 mL Oral Q1 Hr x 2  . OLANZapine  5 mg Oral QHS  . ondansetron  4 mg Intravenous Once  . oxyCODONE  15 mg Oral Q12H  . pantoprazole  40 mg Oral Q1200  . polyethylene glycol  17 g Oral BID  . potassium chloride SA  20 mEq Oral Daily  . QUEtiapine  400 mg Oral QHS  . senna-docusate  2 tablet Oral QHS  . sodium chloride  500 mL Intravenous Once  . sodium phosphate  1 enema Rectal Once  . Tamsulosin HCl  0.8 mg Oral QHS  . warfarin  10 mg Oral Once  . Warfarin - Pharmacist Dosing Inpatient   Does not apply q1800  . DISCONTD: enoxaparin  1 mg/kg Subcutaneous Once  . DISCONTD: warfarin  7.5 mg Oral Daily    Assessment: 50 YOM with history of  DVT on Coumadin PTA (home regimen 7.5mg  daily) admitted 04/17/12 with right thigh pain. LE Dopplers in ED ruled out new DVT. One dose of Lovenox ordered in ED was discontinued. Resuming Coumadin. INR 2.42 this am which is therapeutic for goal of 2-3. Last dose of Coumadin per patient was on 04/16/12.  MD note does state questionable compliance with Coumadin. H/H have dropped but no obvious bleeding reported.  Protime has increased from 19.4 sec >>26.7 sec.  Goal of Therapy:  INR 2-3   Plan:  1. Coumadin 2mg  this pm. 2. Daily PT/INR.  Talbert Cage, PharmD Clinical Pharmacist [phone] 04/18/2012,9:36 AM

## 2012-04-18 NOTE — Care Management Note (Signed)
  Page 1 of 1   04/18/2012     3:37:10 PM   CARE MANAGEMENT NOTE 04/18/2012  Patient:  Robert [name], Robert [name]   Account Number:  [id number]  Date Initiated:  04/18/2012  Documentation initiated by:  Ronny Flurry  Subjective/Objective Assessment:   DX: Right leg and back pain     Action/Plan:   HX: chronic liver failure, metastatic lung cancer, DVT/PE on Coumadin, COPD, chronic back pain   Anticipated DC Date:  04/22/2012   Anticipated DC Plan:           Choice offered to / List presented to:             Status of service:  In process, will continue to follow Medicare Important Message given?   (If response is "NO", the following Medicare IM given date fields will be blank) Date Medicare IM given:   Date Additional Medicare IM given:    Discharge Disposition:    Per UR Regulation:    If discussed at Long Length of Stay Meetings, dates discussed:    Comments:  Patient wishes to be a full code for " 72 hours" in his own words, he understands overall poor prognosis

## 2012-04-19 LAB — PROTIME-INR
INR: 2.36 — ABNORMAL HIGH (ref 0.00–1.49)
Prothrombin Time: 26.2 seconds — ABNORMAL HIGH (ref 11.6–15.2)

## 2012-04-19 MED ORDER — PREGABALIN 75 MG PO CAPS
75.0000 mg | ORAL_CAPSULE | Freq: Every day | ORAL | Status: DC
  Administered 2012-04-19 – 2012-04-20 (×2): 75 mg via ORAL
  Filled 2012-04-19 (×2): qty 3

## 2012-04-19 MED ORDER — OXYCODONE HCL 10 MG PO TB12
20.0000 mg | ORAL_TABLET | Freq: Two times a day (BID) | ORAL | Status: DC
  Administered 2012-04-19 – 2012-04-20 (×2): 20 mg via ORAL
  Filled 2012-04-19: qty 1
  Filled 2012-04-19: qty 2
  Filled 2012-04-19 (×3): qty 1

## 2012-04-19 MED ORDER — BISACODYL 5 MG PO TBEC
10.0000 mg | DELAYED_RELEASE_TABLET | Freq: Two times a day (BID) | ORAL | Status: DC
  Administered 2012-04-20: 10 mg via ORAL
  Filled 2012-04-19 (×2): qty 2
  Filled 2012-04-19: qty 1
  Filled 2012-04-19: qty 2

## 2012-04-19 MED ORDER — WARFARIN SODIUM 4 MG PO TABS
4.0000 mg | ORAL_TABLET | Freq: Once | ORAL | Status: AC
  Administered 2012-04-19: 4 mg via ORAL
  Filled 2012-04-19: qty 1

## 2012-04-19 NOTE — Progress Notes (Addendum)
Subjective: C/o R pelvic pain  Objective: Vital signs in last 24 hours: Temp:  [97.3 F (36.3 C)-97.8 F (36.6 C)] 97.3 F (36.3 C) (07/20 0512) Pulse Rate:  [71-79] 71  (07/20 0512) Resp:  [14-18] 14  (07/20 0512) BP: (132-137)/(84-91) 137/84 mmHg (07/20 0512) SpO2:  [96 %-100 %] 96 % (07/20 0512) Weight change:  Last BM Date: 04/18/12  Intake/Output from previous day: 07/19 0701 - 07/20 0700 In: 800 [P.O.:800] Out: 2 [Urine:2] Total I/O In: 240 [P.O.:240] Out: -    Physical Exam: General: Alert, awake, oriented x3, in no acute distress. HEENT: No bruits, no goiter. Heart: Regular rate and rhythm, without murmurs, rubs, gallops. Lungs: Clear to auscultation bilaterally. Abdomen: Soft, nontender, distended, positive bowel sounds. Extremities: No clubbing cyanosis or edema with positive pedal pulses. Neuro: Grossly intact, nonfocal.    Lab Results: Basic Metabolic Panel:  Basename 04/18/12 0600 04/17/12 1144  NA 146* 141  K 3.3* 3.6  CL 111 102  CO2 25 28  GLUCOSE 89 96  BUN 4* 4*  CREATININE 0.68 0.71  CALCIUM 8.2* 9.3  MG -- --  PHOS -- --   Liver Function Tests:  Basename 04/18/12 0600 04/17/12 1144  AST 14 17  ALT 6 6  ALKPHOS 43 58  BILITOT 0.2* 0.2*  PROT 5.2* 6.7  ALBUMIN 2.5* 3.2*   No results found for this basename: LIPASE:2,AMYLASE:2 in the last 72 hours No results found for this basename: AMMONIA:2 in the last 72 hours CBC:  Basename 04/18/12 0600 04/17/12 1144  WBC 5.4 17.7*  NEUTROABS -- --  HGB 10.9* 12.3*  HCT 33.8* 37.9*  MCV 96.6 97.4  PLT 333 382   Cardiac Enzymes: No results found for this basename: CKTOTAL:3,CKMB:3,CKMBINDEX:3,TROPONINI:3 in the last 72 hours BNP: No results found for this basename: PROBNP:3 in the last 72 hours D-Dimer: No results found for this basename: DDIMER:2 in the last 72 hours CBG: No results found for this basename: GLUCAP:6 in the last 72 hours Hemoglobin A1C: No results found for this  basename: HGBA1C in the last 72 hours Fasting Lipid Panel: No results found for this basename: CHOL,HDL,LDLCALC,TRIG,CHOLHDL,LDLDIRECT in the last 72 hours Thyroid Function Tests: No results found for this basename: TSH,T4TOTAL,FREET4,T3FREE,THYROIDAB in the last 72 hours Anemia Panel: No results found for this basename: VITAMINB12,FOLATE,FERRITIN,TIBC,IRON,RETICCTPCT in the last 72 hours Coagulation:  Basename 04/19/12 0500 04/18/12 0600  LABPROT 26.2* 26.7*  INR 2.36* 2.42*   Urine Drug Screen: Drugs of Abuse  No results found for this basename: labopia, cocainscrnur, labbenz, amphetmu, thcu, labbarb    Alcohol Level: No results found for this basename: ETH:2 in the last 72 hours Urinalysis:  Basename 04/17/12 1314  COLORURINE YELLOW  LABSPEC 1.003*  PHURINE 6.5  GLUCOSEU NEGATIVE  HGBUR NEGATIVE  BILIRUBINUR NEGATIVE  KETONESUR NEGATIVE  PROTEINUR NEGATIVE  UROBILINOGEN 0.2  NITRITE NEGATIVE  LEUKOCYTESUR SMALL*    No results found for this or any previous visit (from the past 240 hour(s)).  Studies/Results: Ct Abdomen Pelvis Wo Contrast  04/18/2012  *RADIOLOGY REPORT*  Clinical Data: Liver cancer.  Questionable ureteral filling defect.  CT ABDOMEN AND PELVIS WITHOUT CONTRAST  Technique:  Multidetector CT imaging of the abdomen and pelvis was performed following the standard protocol without intravenous contrast.  Comparison: 04/17/2012;  Findings: Although hematuria protocol scan with contrast was suggested on the addendum from the prior CT scan, noncontrast stone protocol was specifically requested and performed.  Dependent subsegmental atelectasis noted in both lungs.  The myocardium is  denser than the blood pool, suggesting anemia.  The visualized portion of the liver, spleen, pancreas, and adrenal glands appear unremarkable in noncontrast CT appearance.  Dependent density in the gallbladder could represent small gallstones or sludge.  The ureters are unopacified.  There  is very dilute contrast medium in the urinary bladder.  No renal or ureteral calculus is evident on either side.  No hydronephrosis or hydroureter.  A specific cause for hematuria is not identified.  Scattered small porta hepatis and peripancreatic lymph nodes are present but are not pathologically enlarged by size criteria. Aortoiliac atherosclerosis noted.  The appendix appears normal.  There are air-fluid levels in the descending colon which may reflect diarrheal process.  No significant associated wall thickening.  There are air-fluid levels and borderline dilated loops of left upper quadrant small bowel. A small right inguinal hernia contains adipose tissue.  IMPRESSION:  1.  No renal or ureteral calculi observed.  There is dilute contrast in the urinary bladder, but the ureters are not opacified on today's noncontrast exam. 2.  Dependent density in the gallbladder, potentially sludge or small gallstones. 3.  Dependent atelectasis in the lung bases. 4.  Air fluid levels in the distal colon, possibly from diarrheal process or ileus.  There also air-fluid levels and borderline dilated loops of left sided small bowel.  No retained oral contrast to suggest obstruction.  Original Report Authenticated By: Dellia Cloud, M.D.   Dg Hip Complete Right  04/17/2012  *RADIOLOGY REPORT*  Clinical Data: Right hip pain, no known trauma  RIGHT HIP - COMPLETE 2+ VIEW  Comparison: CT same date  Findings: Retained contrast noted within nondilated bladder. At least one low density filling defect projects over the inferior right ureter measuring 4 mm.  No displaced pelvic fracture. No right hip dislocation or fracture.  IMPRESSION: No right hip fracture dislocation.  Apparent right distal ureteral filling defects.  These could represent radiolucent stones, neoplasm, or less likely gas.  The patient will be brought back for additional imaging and a separate report will be issued at that time.  Original Report Authenticated  By: Harrel Lemon, M.D.   Dg Femur Right  04/17/2012  *RADIOLOGY REPORT*  Clinical Data: Right leg pain  RIGHT FEMUR - 2 VIEW  Comparison: None.  Findings: No fracture identified.  No radiopaque foreign body.  No soft tissue abnormality.  Retained contrast noted within the bladder.  IMPRESSION: No right femoral fracture.  Original Report Authenticated By: Harrel Lemon, M.D.   Dg Abd 1 View  04/17/2012  *RADIOLOGY REPORT*  Clinical Data: Right-sided abdominal pain  ABDOMEN - 1 VIEW  Comparison: CT same date  Findings: Retained contrast projects over nondilated bowel and bladder.  Again, there is a radiolucent filling defect projecting over the distal right ureter.  Colon is normal in caliber.  Several left mid/lateral loops of small bowel remain mildly dilated at 3 cm. Presence or absence of air fluid levels or free air cannot be assessed on this single supine view.  IMPRESSION: No change in partial / early left mid small bowel obstruction or focal ileus.  Apparent filling defect projecting over the distal right ureter. The patient will be brought back for additional imaging and a separate report will be issued.  Original Report Authenticated By: Harrel Lemon, M.D.   Ct Abdomen Pelvis W Contrast  04/17/2012  **ADDENDUM** CREATED: 04/17/2012 23:37:11  A scout image was obtained to determine whether sufficient contrast was still present in the renal  collecting systems to perform additional imaging and to evaluate for possible ureteral filling defect.  There is insufficient contrast to perform additional imaging at this time using the previous contrast dose.  Dedicated hematuria protocol CT with contrast is recommended, preferably after 24 hours, for further evaluation of possible right ureteral filling defects.  Imaging could be performed if clinically indicated sooner, but would be better delayed 24 hours after today's contrast enhanced exam.  I discussed these findings and recommendations with  Dewayne Hatch, the nurse taking care of the patient, on 04/17/2012 at 11:40 p.m.  **END ADDENDUM** SIGNED BY: Harrel Lemon, M.D.   04/17/2012  *RADIOLOGY REPORT*  Clinical Data: History of deep venous thrombosis, cancer and liver disease.  Back pain and right leg pain.  CT ABDOMEN AND PELVIS WITH CONTRAST  Technique:  Multidetector CT imaging of the abdomen and pelvis was performed following the standard protocol during bolus administration of intravenous contrast.  Contrast: 80mL OMNIPAQUE IOHEXOL 300 MG/ML  SOLN  Comparison: None.  Findings: Lung bases show mild dependent atelectasis.  Heart size normal.  No pericardial or pleural effusion.  A sub centimeter low attenuation lesion in the dome of the liver is too small to characterize.  Liver, gallbladder, adrenal glands, kidneys, spleen, pancreas and stomach are otherwise unremarkable. Mild dilatation of proximal small bowel without a discrete transition point.  Colon is unremarkable.  Bladder wall may be minimally thickened.  Small right inguinal hernia contains fat.  Atherosclerotic calcification of the arterial vasculature without abdominal aortic aneurysm.  No pathologically enlarged lymph nodes.  No free fluid.  No worrisome lytic or sclerotic lesions.  IMPRESSION:  1.  Mild nonspecific dilatation of proximal small bowel without a discrete transition point.  Partial small bowel obstruction cannot be excluded. 2.  Mild dependent atelectasis bilaterally.  Original Report Authenticated By: Harrel Lemon, M.D.    Medications: Scheduled Meds:   . DULoxetine  60 mg Oral BID  . gabapentin  300 mg Oral QID  . HYDROcodone-acetaminophen  1 tablet Oral QID  . OLANZapine  5 mg Oral QHS  . oxyCODONE  15 mg Oral Q12H  . pantoprazole  40 mg Oral Q1200  . polyethylene glycol  17 g Oral BID  . potassium chloride SA  20 mEq Oral Daily  . QUEtiapine  400 mg Oral QHS  . senna-docusate  2 tablet Oral QHS  . Tamsulosin HCl  0.8 mg Oral QHS  . warfarin  2 mg Oral  ONCE-1800  . Warfarin - Pharmacist Dosing Inpatient   Does not apply q1800   Continuous Infusions:  PRN Meds:.acetaminophen, acetaminophen, HYDROmorphone (DILAUDID) injection, ondansetron, oxycodone, zolpidem  Assessment/Plan:  1. Right thigh pain with acute and chronic low back pain   check x-rays negative for obvious metastatic lesions  Dopplers of right lower extremity negative for DVT  Suspect some of his right thigh pain is neuropathic from prior chemotherapy  Continue home dose of narcotics, will add MS Contin for longer duration of effects  Also request physical therapy evaluation   2. history of pulmonary emboli/DVT On Coumadin, questionable compliance with this  request pharmacy to dose Coumadin  Not transition to long-term Lovenox due to poor compliance/ patient not interested in continuing Lovenox long-term   3. Metastatic lung cancer , originally treated at [facility] , last chemotherapy over a year ago , was then referred to hospice  Patient tells me that hospice give him 20 months to live, this was over a year ago  4.  COPD: Stable  5. Adynamic ileus/ chronic constipation secondary to narcotics  without any symptoms of bowel obstruction at this time  Advance to full liq, Miralax twice a day and enemas, repeat KUB in am  6. Leukocytosis: suspect related to cancer, no evidence of acute infection at this time , improved  7. ? R ureteral filling defect: check Ct stone protocol, based on radiologist recommendations  8. Prophylaxis: PPI, currently on Coumadin    LOS: 2 days   Charis Juliana [facility]  04/19/2012, 10:42 AM

## 2012-04-19 NOTE — Progress Notes (Signed)
ANTICOAGULATION CONSULT NOTE - Initial Consult  Pharmacy Consult for Warfarin Indication: Hx DVT, PE    No Known Allergies  Patient Measurements: Height: 5\' 7"  (170.2 cm) Weight: 150 lb 1.8 oz (68.09 kg) IBW/kg (Calculated) : 66.1   Vital Signs: Temp: 97.3 F (36.3 C) (07/20 0512) Temp src: Oral (07/20 0512) BP: 137/84 mmHg (07/20 0512) Pulse Rate: 71  (07/20 0512)  Labs:  Basename 04/19/12 0500 04/18/12 0600 04/17/12 1144  HGB -- 10.9* 12.3*  HCT -- 33.8* 37.9*  PLT -- 333 382  APTT -- -- --  LABPROT 26.2* 26.7* 19.4*  INR 2.36* 2.42* 1.61*  HEPARINUNFRC -- -- --  CREATININE -- 0.68 0.71  CKTOTAL -- -- --  CKMB -- -- --  TROPONINI -- -- --    Estimated Creatinine Clearance: 103.3 ml/min (by C-G formula based on Cr of 0.68).  Assessment: Robert [name] with history of DVT on Coumadin PTA (home regimen 7.5mg  daily) admitted 04/17/12 with right thigh pain. LE Dopplers in ED ruled out new DVT.   One dose of Lovenox ordered in ED was discontinued. INR continues to be therapeutic s/p abrupt increase from 10mg  dose given 7/18. Noted question of noncompliance with Coumadin PTA d/t subtherapeutic INR on admit.  Noted H/H and plts decr from baseline, no overt bleeding reported.   Goal of Therapy:  INR 2-3   Plan:  - Coumadin 4mg  PO x 1 today - Will attempt to resume home regimen once INR stabilizes - Will f/up daily INR for now  Benny Henrie K. Allena Katz, PharmD, BCPS.  Clinical Pharmacist Pager [phone]. 04/19/2012 11:06 AM

## 2012-04-19 NOTE — Progress Notes (Signed)
Assessment/Plan:  1. Right thigh pain with acute and chronic low back pain   check x-rays negative for obvious metastatic lesions  Dopplers of right lower extremity negative for DVT  Suspect some of his right thigh pain is neuropathic from prior chemotherapy  Continue home dose of narcotics, Increase MS contin, continue neurontin, add Lyrica  Await physical therapy evaluation  Ambulate  2. 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 Metastatic lung cancer , originally treated at [facility] , last chemotherapy over a year ago , was then referred to hospice  Patient tells me that hospice give him 20 months to live, this was over a year ago   4. COPD: Stable   5. Adynamic ileus/ chronic constipation secondary to narcotics  without any symptoms of bowel obstruction at this time  Advance to full liq, Miralax twice a day and enemas, Add Dulcolax BID too and continue colace,  6. Leukocytosis: suspect related to cancer, no evidence of acute infection at this time , improved  7. ? R ureteral filling defect: checked Ct stone protocol, based on radiologist recommendations, unremarkable  8. Prophylaxis: PPI, currently on Coumadin  Ambulate, OOB Hopefully DC home tomorrow Still awaiting PT eval   Subjective: C/o pain all over, R thigh numb and hurting  Objective: Vital signs in last 24 hours: Temp:  [97.3 F (36.3 C)-97.8 F (36.6 C)] 97.3 F (36.3 C) (07/20 0512) Pulse Rate:  [71-79] 71  (07/20 0512) Resp:  [14-18] 14  (07/20 0512) BP: (132-137)/(84-91) 137/84 mmHg (07/20 0512) SpO2:  [96 %-100 %] 96 % (07/20 0512) Weight change:  Last BM Date: 04/18/12  Intake/Output from previous day: 07/19 0701 - 07/20 0700 In: 800 [P.O.:800] Out: 2 [Urine:2] Total I/O In: 240 [P.O.:240] Out: -    Physical Exam: General: Alert, awake, oriented x3, in no acute distress. HEENT: No bruits, no goiter. Heart: Regular rate and  rhythm, without murmurs, rubs, gallops. Lungs: poor air movement bilaterally. Abdomen: Soft, nontender, distended, positive bowel sounds. Extremities: No clubbing cyanosis or edema with positive pedal pulses. Neuro: Grossly intact, nonfocal.    Lab Results: Basic Metabolic Panel:  Basename 04/18/12 0600 04/17/12 1144  NA 146* 141  K 3.3* 3.6  CL 111 102  CO2 25 28  GLUCOSE 89 96  BUN 4* 4*  CREATININE 0.68 0.71  CALCIUM 8.2* 9.3  MG -- --  PHOS -- --   Liver Function Tests:  Basename 04/18/12 0600 04/17/12 1144  AST 14 17  ALT 6 6  ALKPHOS 43 58  BILITOT 0.2* 0.2*  PROT 5.2* 6.7  ALBUMIN 2.5* 3.2*   No results found for this basename: LIPASE:2,AMYLASE:2 in the last 72 hours No results found for this basename: AMMONIA:2 in the last 72 hours CBC:  Basename 04/18/12 0600 04/17/12 1144  WBC 5.4 17.7*  NEUTROABS -- --  HGB 10.9* 12.3*  HCT 33.8* 37.9*  MCV 96.6 97.4  PLT 333 382   Cardiac Enzymes: No results found for this basename: CKTOTAL:3,CKMB:3,CKMBINDEX:3,TROPONINI:3 in the last 72 hours BNP: No results found for this basename: PROBNP:3 in the last 72 hours D-Dimer: No results found for this basename: DDIMER:2 in the last 72 hours CBG: No results found for this basename: GLUCAP:6 in the last 72 hours Hemoglobin A1C: No results found for this basename: HGBA1C in the last 72 hours Fasting Lipid Panel: No results found for this basename: CHOL,HDL,LDLCALC,TRIG,CHOLHDL,LDLDIRECT in the last 72 hours Thyroid Function Tests: No results  found for this basename: TSH,T4TOTAL,FREET4,T3FREE,THYROIDAB in the last 72 hours Anemia Panel: No results found for this basename: VITAMINB12,FOLATE,FERRITIN,TIBC,IRON,RETICCTPCT in the last 72 hours Coagulation:  Basename 04/19/12 0500 04/18/12 0600  LABPROT 26.2* 26.7*  INR 2.36* 2.42*   Urine Drug Screen: Drugs of Abuse  No results found for this basename: labopia,  cocainscrnur,  labbenz,  amphetmu,  thcu,  labbarb     Alcohol Level: No results found for this basename: ETH:2 in the last 72 hours Urinalysis:  Basename 04/17/12 1314  COLORURINE YELLOW  LABSPEC 1.003*  PHURINE 6.5  GLUCOSEU NEGATIVE  HGBUR NEGATIVE  BILIRUBINUR NEGATIVE  KETONESUR NEGATIVE  PROTEINUR NEGATIVE  UROBILINOGEN 0.2  NITRITE NEGATIVE  LEUKOCYTESUR SMALL*    No results found for this or any previous visit (from the past 240 hour(s)).  Studies/Results: Ct Abdomen Pelvis Wo Contrast  04/18/2012  *RADIOLOGY REPORT*  Clinical Data: Liver cancer.  Questionable ureteral filling defect.  CT ABDOMEN AND PELVIS WITHOUT CONTRAST  Technique:  Multidetector CT imaging of the abdomen and pelvis was performed following the standard protocol without intravenous contrast.  Comparison: 04/17/2012;  Findings: Although hematuria protocol scan with contrast was suggested on the addendum from the prior CT scan, noncontrast stone protocol was specifically requested and performed.  Dependent subsegmental atelectasis noted in both lungs.  The myocardium is denser than the blood pool, suggesting anemia.  The visualized portion of the liver, spleen, pancreas, and adrenal glands appear unremarkable in noncontrast CT appearance.  Dependent density in the gallbladder could represent small gallstones or sludge.  The ureters are unopacified.  There is very dilute contrast medium in the urinary bladder.  No renal or ureteral calculus is evident on either side.  No hydronephrosis or hydroureter.  A specific cause for hematuria is not identified.  Scattered small porta hepatis and peripancreatic lymph nodes are present but are not pathologically enlarged by size criteria. Aortoiliac atherosclerosis noted.  The appendix appears normal.  There are air-fluid levels in the descending colon which may reflect diarrheal process.  No significant associated wall thickening.  There are air-fluid levels and borderline dilated loops of left upper quadrant small bowel. A small  right inguinal hernia contains adipose tissue.  IMPRESSION:  1.  No renal or ureteral calculi observed.  There is dilute contrast in the urinary bladder, but the ureters are not opacified on today's noncontrast exam. 2.  Dependent density in the gallbladder, potentially sludge or small gallstones. 3.  Dependent atelectasis in the lung bases. 4.  Air fluid levels in the distal colon, possibly from diarrheal process or ileus.  There also air-fluid levels and borderline dilated loops of left sided small bowel.  No retained oral contrast to suggest obstruction.  Original Report Authenticated By: Dellia Cloud, M.D.   Dg Hip Complete Right  04/17/2012  *RADIOLOGY REPORT*  Clinical Data: Right hip pain, no known trauma  RIGHT HIP - COMPLETE 2+ VIEW  Comparison: CT same date  Findings: Retained contrast noted within nondilated bladder. At least one low density filling defect projects over the inferior right ureter measuring 4 mm.  No displaced pelvic fracture. No right hip dislocation or fracture.  IMPRESSION: No right hip fracture dislocation.  Apparent right distal ureteral filling defects.  These could represent radiolucent stones, neoplasm, or less likely gas.  The patient will be brought back for additional imaging and a separate report will be issued at that time.  Original Report Authenticated By: Harrel Lemon, M.D.   Dg Femur Right  04/17/2012  *RADIOLOGY REPORT*  Clinical Data: Right leg pain  RIGHT FEMUR - 2 VIEW  Comparison: None.  Findings: No fracture identified.  No radiopaque foreign body.  No soft tissue abnormality.  Retained contrast noted within the bladder.  IMPRESSION: No right femoral fracture.  Original Report Authenticated By: Harrel Lemon, M.D.   Dg Abd 1 View  04/17/2012  *RADIOLOGY REPORT*  Clinical Data: Right-sided abdominal pain  ABDOMEN - 1 VIEW  Comparison: CT same date  Findings: Retained contrast projects over nondilated bowel and bladder.  Again, there is a  radiolucent filling defect projecting over the distal right ureter.  Colon is normal in caliber.  Several left mid/lateral loops of small bowel remain mildly dilated at 3 cm. Presence or absence of air fluid levels or free air cannot be assessed on this single supine view.  IMPRESSION: No change in partial / early left mid small bowel obstruction or focal ileus.  Apparent filling defect projecting over the distal right ureter. The patient will be brought back for additional imaging and a separate report will be issued.  Original Report Authenticated By: Harrel Lemon, M.D.   Ct Abdomen Pelvis W Contrast  04/17/2012  **ADDENDUM** CREATED: 04/17/2012 23:37:11  A scout image was obtained to determine whether sufficient contrast was still present in the renal collecting systems to perform additional imaging and to evaluate for possible ureteral filling defect.  There is insufficient contrast to perform additional imaging at this time using the previous contrast dose.  Dedicated hematuria protocol CT with contrast is recommended, preferably after 24 hours, for further evaluation of possible right ureteral filling defects.  Imaging could be performed if clinically indicated sooner, but would be better delayed 24 hours after today's contrast enhanced exam.  I discussed these findings and recommendations with Dewayne Hatch, the nurse taking care of the patient, on 04/17/2012 at 11:40 p.m.  **END ADDENDUM** SIGNED BY: Harrel Lemon, M.D.   04/17/2012  *RADIOLOGY REPORT*  Clinical Data: History of deep venous thrombosis, cancer and liver disease.  Back pain and right leg pain.  CT ABDOMEN AND PELVIS WITH CONTRAST  Technique:  Multidetector CT imaging of the abdomen and pelvis was performed following the standard protocol during bolus administration of intravenous contrast.  Contrast: 80mL OMNIPAQUE IOHEXOL 300 MG/ML  SOLN  Comparison: None.  Findings: Lung bases show mild dependent atelectasis.  Heart size normal.  No  pericardial or pleural effusion.  A sub centimeter low attenuation lesion in the dome of the liver is too small to characterize.  Liver, gallbladder, adrenal glands, kidneys, spleen, pancreas and stomach are otherwise unremarkable. Mild dilatation of proximal small bowel without a discrete transition point.  Colon is unremarkable.  Bladder wall may be minimally thickened.  Small right inguinal hernia contains fat.  Atherosclerotic calcification of the arterial vasculature without abdominal aortic aneurysm.  No pathologically enlarged lymph nodes.  No free fluid.  No worrisome lytic or sclerotic lesions.  IMPRESSION:  1.  Mild nonspecific dilatation of proximal small bowel without a discrete transition point.  Partial small bowel obstruction cannot be excluded. 2.  Mild dependent atelectasis bilaterally.  Original Report Authenticated By: Harrel Lemon, M.D.    Medications: Scheduled Meds:    . DULoxetine  60 mg Oral BID  . gabapentin  300 mg Oral QID  . HYDROcodone-acetaminophen  1 tablet Oral QID  . OLANZapine  5 mg Oral QHS  . oxyCODONE  15 mg Oral Q12H  . pantoprazole  40 mg  Oral Q1200  . polyethylene glycol  17 g Oral BID  . potassium chloride SA  20 mEq Oral Daily  . pregabalin  75 mg Oral Daily  . QUEtiapine  400 mg Oral QHS  . senna-docusate  2 tablet Oral QHS  . Tamsulosin HCl  0.8 mg Oral QHS  . warfarin  2 mg Oral ONCE-1800  . Warfarin - Pharmacist Dosing Inpatient   Does not apply q1800   Continuous Infusions:  PRN Meds:.acetaminophen, acetaminophen, HYDROmorphone (DILAUDID) injection, ondansetron, oxycodone, zolpidem   LOS: 2 days   [facility] [phone] 04/19/2012, 10:54 AM

## 2012-04-19 NOTE — Evaluation (Signed)
Physical Therapy Evaluation Patient Details Name: Robert [name] MRN: [id number] DOB: 1960-11-18 Today's Date: 04/19/2012 Time: 4098-1191 PT Time Calculation (min): 14 min  PT Assessment / Plan / Recommendation Clinical Impression  Pt admitted for back and leg pain. Pt with multiple falls recently and increased balance loss, history of stroke with weakness. Pt will benefit from skilled PT in the acute care setting in order to maximize functional mobility and safety prior to d/c home    PT Assessment  Patient needs continued PT services    Follow Up Recommendations  Home health PT;Supervision for mobility/OOB    Barriers to Discharge Decreased caregiver support      Equipment Recommendations  None recommended by PT    Recommendations for Other Services     Frequency Min 3X/week    Precautions / Restrictions Precautions Precautions: Fall Restrictions Weight Bearing Restrictions: No         Mobility  Bed Mobility Bed Mobility: Supine to Sit;Sitting - Scoot to Edge of Bed Supine to Sit: 6: Modified independent (Device/Increase time) Sitting - Scoot to Edge of Bed: 6: Modified independent (Device/Increase time) Sit to Supine: 6: Modified independent (Device/Increase time) Transfers Transfers: Sit to Stand;Stand to Sit Sit to Stand: 4: Min assist;With upper extremity assist;From bed Stand to Sit: 4: Min assist;With upper extremity assist;To bed Details for Transfer Assistance: VC for hand placement and safety standing to quad cane.  Ambulation/Gait Ambulation/Gait Assistance: 4: Min assist Ambulation Distance (Feet): 20 Feet Assistive device: Large base quad cane Ambulation/Gait Assistance Details: VC for proper sequencing and safety with quad cane. Min assist for stability. May attempt RW with pt next session for increased stability. Gait Pattern: Step-to pattern;Narrow base of support;Decreased trunk rotation;Decreased hip/knee flexion - right;Decreased hip/knee  flexion - left;Decreased stride length Gait velocity: decreased gait speed General Gait Details: Ambulation distance limited secondary to O2    Exercises     PT Diagnosis: Difficulty walking;Acute pain  PT Problem List: Decreased activity tolerance;Decreased balance;Decreased mobility;Decreased knowledge of use of DME;Decreased safety awareness;Decreased knowledge of precautions;Pain PT Treatment Interventions: DME instruction;Gait training;Stair training;Functional mobility training;Therapeutic activities;Therapeutic exercise;Balance training;Patient/family education   PT Goals Acute Rehab PT Goals PT Goal Formulation: With patient Time For Goal Achievement: 04/26/12 Potential to Achieve Goals: Fair Pt will go Sit to Stand: with modified independence PT Goal: Sit to Stand - Progress: Goal set today Pt will go Stand to Sit: with modified independence PT Goal: Stand to Sit - Progress: Goal set today Pt will Transfer Bed to Chair/Chair to Bed: with supervision PT Transfer Goal: Bed to Chair/Chair to Bed - Progress: Goal set today Pt will Ambulate: >150 feet;with supervision;with least restrictive assistive device PT Goal: Ambulate - Progress: Goal set today Pt will Go Up / Down Stairs: 1-2 stairs;with min assist PT Goal: Up/Down Stairs - Progress: Goal set today  Visit Information  Last PT Received On: 04/19/12 Assistance Needed: +1    Subjective Data      Prior Functioning  Home Living Lives With: Family Available Help at Discharge: Family;Available 24 hours/day;Personal care attendant (caregiver 6 hours a day- 5 days a week) Type of Home: House Home Access: Stairs to enter [employer] of Steps: 2 Entrance Stairs-Rails: None Home Layout: One level Bathroom Shower/Tub: Tub/shower unit;Door [employer] Toilet: Standard Bathroom Accessibility: Yes How Accessible: Accessible via walker Home Adaptive Equipment: Quad cane;Straight cane;Walker - rolling;Wheelchair -  manual Additional Comments: stroke left L hand immobile Prior Function Level of Independence: Independent with assistive device(s) Able to  Take Stairs?: Yes Driving: No Vocation: On disability Comments: fishing and hunting Communication Communication: No difficulties Dominant Hand: Right    Cognition  Overall Cognitive Status: Appears within functional limits for tasks assessed/performed Arousal/Alertness: Awake/alert Orientation Level: Appears intact for tasks assessed Behavior During Session: [facility] for tasks performed    Extremity/Trunk Assessment Right Lower Extremity Assessment RLE ROM/Strength/Tone: Within functional levels RLE Sensation: WFL - Light Touch Left Lower Extremity Assessment LLE ROM/Strength/Tone: Within functional levels LLE Sensation: WFL - Light Touch   Balance    End of Session PT - End of Session Equipment Utilized During Treatment: Gait belt Activity Tolerance: Patient limited by fatigue Patient left: in bed;with call bell/phone within reach;with family/visitor present Nurse Communication: Mobility status    Milana Kidney 04/19/2012, 4:24 PM  04/19/2012 Milana Kidney DPT PAGER: [phone] OFFICE: [phone]

## 2012-04-20 MED ORDER — ZOLPIDEM TARTRATE 10 MG PO TABS: 10.0000 mg | ORAL_TABLET | Freq: Every evening | ORAL | Status: DC | PRN

## 2012-04-20 MED ORDER — OXYCODONE HCL 30 MG PO TABS: 30.0000 mg | ORAL_TABLET | Freq: Four times a day (QID) | ORAL | Status: AC | PRN

## 2012-04-20 MED ORDER — POLYETHYLENE GLYCOL 3350 17 G PO PACK: 17.0000 g | PACK | Freq: Two times a day (BID) | ORAL | Status: AC

## 2012-04-20 MED ORDER — PREGABALIN 75 MG PO CAPS: 75.0000 mg | ORAL_CAPSULE | Freq: Every day | ORAL | Status: DC

## 2012-04-20 MED ORDER — OXYCODONE HCL 20 MG PO TB12: 20.0000 mg | ORAL_TABLET | Freq: Two times a day (BID) | ORAL | Status: DC

## 2012-04-20 MED ORDER — WARFARIN SODIUM 5 MG PO TABS
5.0000 mg | ORAL_TABLET | Freq: Once | ORAL | Status: DC
  Filled 2012-04-20: qty 1

## 2012-04-20 NOTE — Progress Notes (Signed)
ANTICOAGULATION CONSULT NOTE - Initial Consult  Pharmacy Consult for Warfarin Indication: Hx DVT, PE    No Known Allergies  Patient Measurements: Height: 5\' 7"  (170.2 cm) Weight: 150 lb 1.8 oz (68.09 kg) IBW/kg (Calculated) : 66.1   Vital Signs: Temp: 97.5 F (36.4 C) (07/21 0521) Temp src: Oral (07/21 0521) BP: 144/94 mmHg (07/21 0521) Pulse Rate: 66  (07/21 0521)  Labs:  Basename 04/20/12 0455 04/19/12 0500 04/18/12 0600 04/17/12 1144  HGB -- -- 10.9* 12.3*  HCT -- -- 33.8* 37.9*  PLT -- -- 333 382  APTT -- -- -- --  LABPROT 24.7* 26.2* 26.7* --  INR 2.19* 2.36* 2.42* --  HEPARINUNFRC -- -- -- --  CREATININE -- -- 0.68 0.71  CKTOTAL -- -- -- --  CKMB -- -- -- --  TROPONINI -- -- -- --    Estimated Creatinine Clearance: 103.3 ml/min (by C-G formula based on Cr of 0.68).  Assessment: Robert [name] with history of DVT on Coumadin PTA (home regimen 7.5mg  daily) admitted 04/17/12 with right thigh pain. LE Dopplers in ED ruled out new DVT.   One dose of Lovenox ordered in ED was discontinued. INR continues to be therapeutic s/p abrupt increase from 10mg  dose given 7/18. Noted question of noncompliance with Coumadin PTA d/t subtherapeutic INR on admit.  Noted H/H and plts decr from baseline, no overt bleeding reported.   Goal of Therapy:  INR 2-3   Plan:  - Coumadin 5 mg PO x 1 today- in attempt to keep INR from trending down further - Will attempt to resume home regimen once INR stabilizes - Will f/up daily INR for now  Elizabeth Paulsen K. Allena Katz, PharmD, BCPS.  Clinical Pharmacist Pager [phone]. 04/20/2012 10:16 AM

## 2012-04-20 NOTE — Progress Notes (Signed)
   CARE MANAGEMENT NOTE 04/20/2012  Patient:  Robert [name], Robert [name]   Account Number:  [id number]  Date Initiated:  04/18/2012  Documentation initiated by:  Ronny Flurry  Subjective/Objective Assessment:   DX: Right leg and back pain     Action/Plan:   HX: chronic liver failure, metastatic lung cancer, DVT/PE on Coumadin, COPD, chronic back pain   Anticipated DC Date:  04/22/2012   Anticipated DC Plan:  HOME W HOME HEALTH SERVICES      DC Planning Services  CM consult      [facility] Choice  HOME HEALTH   Choice offered to / List presented to:  C-1 Patient        HH arranged  HH-2 PT  HH-1 RN      [facility]  [facility]   Status of service:  Completed, signed off Medicare Important Message given?   (If response is "NO", the following Medicare IM given date fields will be blank) Date Medicare IM given:   Date Additional Medicare IM given:    Discharge Disposition:  HOME W HOME HEALTH SERVICES  Per UR Regulation:    If discussed at Long Length of Stay Meetings, dates discussed:    Comments:  04/20/2012 1350 Spoke to on call RN and states she will make office aware of referral. Will have to wait until 7/22 to verify agency can service pt for [facility]. Faxed orders, facesheet, F2F and d/c summary to [facility] [employer]. fax #[phone]. Isidoro Donning RN CCM Case Mgmt phone [phone]  04/20/2012 1130 Pt states he had [facility] but he cancelled his services with them. States they had a disagreement about him going to a SNF. States he does not want to go to a Nursing Home. Has an aide that comes from [facility] that assist him with bathing and light chores. Requesting them for [facility]. Contacted HH with [facility] and left msg with on call RN for call back. Isidoro Donning RN CCM Case Mgmt phone [phone]  Patient wishes to be a full code for " 72 hours" in his own words, he understands overall poor prognosis

## 2012-05-14 NOTE — Discharge Summary (Signed)
Physician Discharge Summary  Patient ID: Robert [name] MRN: [id number] DOB/AGE: [birth year] 51 y.o.  Admit date: 04/17/2012 Discharge date: 05/14/2012  Primary Care Physician:  Quentin Mulling, MD   Discharge Diagnoses:    Active Problems:  Lung cancer  Pulmonary emboli  COPD (chronic obstructive pulmonary disease)  Leg pain, right  Low back pain  Hepatitis C  Adynamic ileus Neuropathy    Medication List  As of 05/14/2012  3:22 PM   TAKE these medications         cyanocobalamin 1000 MCG/ML injection   Commonly known as: (VITAMIN B-12)   Inject 1,000 mcg into the muscle once a week.      DULoxetine 60 MG capsule   Commonly known as: CYMBALTA   Take 60 mg by mouth 2 (two) times daily.      folic acid 1 MG tablet   Commonly known as: FOLVITE   Take 1 mg by mouth daily.      gabapentin 300 MG capsule   Commonly known as: NEURONTIN   Take 300 mg by mouth 4 (four) times daily.      HYDROcodone-acetaminophen 10-325 MG per tablet   Commonly known as: NORCO   Take 2 tablets by mouth 4 (four) times daily.      multivitamin with minerals Tabs   Take 1 tablet by mouth daily.      OLANZapine 5 MG tablet   Commonly known as: ZYPREXA   Take 5 mg by mouth at bedtime.      omeprazole 20 MG capsule   Commonly known as: PRILOSEC   Take 20 mg by mouth 2 (two) times daily.      oxycodone 30 MG immediate release tablet   Commonly known as: ROXICODONE   Take 30 mg by mouth every 4 (four) hours as needed. For pain      oxyCODONE 20 MG 12 hr tablet   Commonly known as: OXYCONTIN   Take 1 tablet (20 mg total) by mouth every 12 (twelve) hours.      potassium chloride SA 20 MEQ tablet   Commonly known as: K-DUR,KLOR-CON   Take 20 mEq by mouth daily.      pregabalin 75 MG capsule   Commonly known as: LYRICA   Take 1 capsule (75 mg total) by mouth daily.      QUEtiapine 200 MG tablet   Commonly known as: SEROQUEL   Take 200-400 mg by mouth at bedtime.     senna-docusate 8.6-50 MG per tablet   Commonly known as: Senokot-S   Take 1-4 tablets by mouth daily as needed. Congestion      Tamsulosin HCl 0.4 MG Caps   Commonly known as: FLOMAX   Take 0.8 mg by mouth at bedtime.      warfarin 5 MG tablet   Commonly known as: COUMADIN   Take 7.5 mg by mouth daily.      zolpidem 10 MG tablet   Commonly known as: AMBIEN   Take 1 tablet (10 mg total) by mouth at bedtime as needed. For sleep           Disposition and Follow-up:  PCP in 1 week  Significant Diagnostic Studies:   CT ABDOMEN AND PELVIS WITHOUT CONTRAST IMPRESSION: 1. No renal or ureteral calculi observed. There is dilute contrast in the urinary bladder, but the ureters are not opacified on today's noncontrast exam. 2. Dependent density in the gallbladder, potentially sludge or small gallstones. 3. Dependent atelectasis in the lung bases.  4. Air fluid levels in the distal colon, possibly from diarrheal process or ileus. There also air-fluid levels and borderline dilated loops of left sided small bowel. No retained oral contrast to suggest obstruction.   CT ABDOMEN AND PELVIS WITHOUT CONTRAST IMPRESSION: 1. No renal or ureteral calculi observed. There is dilute contrast in the urinary bladder, but the ureters are not opacified on today's noncontrast exam. 2. Dependent density in the gallbladder, potentially sludge or small gallstones. 3. Dependent atelectasis in the lung bases. 4. Air fluid levels in the distal colon, possibly from diarrheal process or ileus. There also air-fluid levels and borderline dilated loops of left sided small bowel. No retained oral contrast to suggest obstruction.  Original Report Authenticated By: Dellia Cloud, M.D. CT Abdomen Pelvis W Contrast [[id number]] Order Status: Completed Resulted: 04/17/12 1505 Addenda: **ADDENDUM** CREATED: 04/17/2012 23:37:11 A scout image was obtained to determine whether sufficient contrast was still  present in the renal collecting systems to perform additional imaging and to evaluate for possible ureteral filling defect. There is insufficient contrast to perform additional imaging at this time using the previous contrast dose. Dedicated hematuria protocol CT with contrast is recommended, preferably after 24 hours, for further evaluation of possible right ureteral filling defects. Imaging could be performed if clinically indicated sooner, but would be better delayed 24 hours after today's contrast enhanced exam. I discussed these findings and recommendations with Dewayne Hatch, the nurse taking care of the patient, on 04/17/2012 at 11:40 p.m. **END ADDENDUM** SIGNED BY: Harrel Lemon, M.D. Signed: 04/17/12 2343 by Harrel Lemon, MD Narrative: *RADIOLOGY REPORT*     RIGHT FEMUR - 2 VIEW IMPRESSION: No right femoral fracture.  ABDOMEN - 1 VIEW IMPRESSION: No change in partial / early left mid small bowel obstruction or focal ileus. Apparent filling defect projecting over the distal right ureter. The patient will be brought back for additional imaging and a separate report will be issued.   RIGHT HIP - COMPLETE 2+ VIEW IMPRESSION: No right hip fracture dislocation.  Brief H and P: Chief Complaint:  Right leg and back pain  HPI:  Robert [name] is a 51 year old male with history of chronic liver failure, metastatic lung cancer, DVT/PE on Coumadin, COPD, chronic back pain who was discharged from hospice care 2 months ago presents to the ER with the above-mentioned complaint .  Patient is a very poor historian, reports ongoing pain in his right thigh for over a week now , which he describes the pain as sharp, burning associated with some tingling, radiating up his lower back and hip .  in addition he also reports worsening of his chronic low back pain.  He denies any incontinence of stool or urine. Denies any fevers or chills.  History of frequent falls almost one per day for the last week.    Upon evaluation the emergency room he was ruled out for DVT based on venous Dopplers, also had a CT abdomen pelvis done which showed nonspecific dilatation of proximal small bowel, however patient denies any abdominal pain nausea vomiting or even constipation. Reports having a bowel movement yesterday   Hospital Course:  1. Right thigh pain with acute and chronic low back pain  check x-rays negative for obvious metastatic lesions  Dopplers of right lower extremity negative for DVT  Suspect some of his right thigh pain is neuropathic from prior chemotherapy  Continue home dose of narcotics, Increase MS contin, continue neurontin, add Lyrica  Ambulated with physical therapy   2. history  of pulmonary emboli/DVT On Coumadin, questionable compliance with this  INR therapeutic   3. Metastatic lung cancer , originally treated at [facility] , last chemotherapy over a year ago , was then referred to hospice  Patient tells me that hospice give him 20 months to live, this was over a year ago   4. COPD: Stable   5. Adynamic ileus/ chronic constipation secondary to narcotics  without any symptoms of bowel obstruction at this time  Diet advaned to soft bland diet prior to discharge, he also received 2 enemas in the hospital, continue Miralax twice a day and Dulcolax BID and colace,   6. Leukocytosis: suspect related to cancer, no evidence of acute infection at this time , improved   7. ? R ureteral filling defect: seen on original CT, repeat  Ct stone protocol, based on radiologist recommendations, unremarkable   8. Prophylaxis: PPI, currently on Coumadin    Time spent on Discharge: Signed: Nafisah Runions [facility]  05/14/2012, 3:22 PM

## 2012-05-30 ENCOUNTER — Emergency Department (HOSPITAL_COMMUNITY): Payer: Medicaid Other

## 2012-05-30 ENCOUNTER — Emergency Department (HOSPITAL_COMMUNITY)
Admission: EM | Admit: 2012-05-30 | Discharge: 2012-05-30 | Disposition: A | Discharge status: Home / Self Care | Payer: Medicaid Other | Attending: Emergency Medicine | Admitting: Emergency Medicine

## 2012-05-30 ENCOUNTER — Encounter (HOSPITAL_COMMUNITY): Payer: Self-pay

## 2012-05-30 DIAGNOSIS — Z8673 Personal history of transient ischemic attack (TIA), and cerebral infarction without residual deficits: Secondary | ICD-10-CM | POA: Insufficient documentation

## 2012-05-30 DIAGNOSIS — J4489 Other specified chronic obstructive pulmonary disease: Secondary | ICD-10-CM | POA: Insufficient documentation

## 2012-05-30 DIAGNOSIS — M545 Low back pain, unspecified: Secondary | ICD-10-CM | POA: Insufficient documentation

## 2012-05-30 DIAGNOSIS — R0609 Other forms of dyspnea: Secondary | ICD-10-CM | POA: Insufficient documentation

## 2012-05-30 DIAGNOSIS — Z9981 Dependence on supplemental oxygen: Secondary | ICD-10-CM | POA: Insufficient documentation

## 2012-05-30 DIAGNOSIS — I252 Old myocardial infarction: Secondary | ICD-10-CM | POA: Insufficient documentation

## 2012-05-30 DIAGNOSIS — B1920 Unspecified viral hepatitis C without hepatic coma: Secondary | ICD-10-CM | POA: Insufficient documentation

## 2012-05-30 DIAGNOSIS — C3490 Malignant neoplasm of unspecified part of unspecified bronchus or lung: Secondary | ICD-10-CM | POA: Insufficient documentation

## 2012-05-30 DIAGNOSIS — Z86711 Personal history of pulmonary embolism: Secondary | ICD-10-CM | POA: Insufficient documentation

## 2012-05-30 DIAGNOSIS — J449 Chronic obstructive pulmonary disease, unspecified: Secondary | ICD-10-CM | POA: Insufficient documentation

## 2012-05-30 DIAGNOSIS — Z86718 Personal history of other venous thrombosis and embolism: Secondary | ICD-10-CM | POA: Insufficient documentation

## 2012-05-30 DIAGNOSIS — Z79899 Other long term (current) drug therapy: Secondary | ICD-10-CM | POA: Insufficient documentation

## 2012-05-30 DIAGNOSIS — R0602 Shortness of breath: Secondary | ICD-10-CM | POA: Insufficient documentation

## 2012-05-30 DIAGNOSIS — R079 Chest pain, unspecified: Secondary | ICD-10-CM | POA: Insufficient documentation

## 2012-05-30 DIAGNOSIS — R209 Unspecified disturbances of skin sensation: Secondary | ICD-10-CM | POA: Insufficient documentation

## 2012-05-30 DIAGNOSIS — F17200 Nicotine dependence, unspecified, uncomplicated: Secondary | ICD-10-CM | POA: Insufficient documentation

## 2012-05-30 DIAGNOSIS — R0989 Other specified symptoms and signs involving the circulatory and respiratory systems: Secondary | ICD-10-CM | POA: Insufficient documentation

## 2012-05-30 DIAGNOSIS — K769 Liver disease, unspecified: Secondary | ICD-10-CM | POA: Insufficient documentation

## 2012-05-30 LAB — COMPREHENSIVE METABOLIC PANEL
AST: 36 U/L (ref 0–37)
Albumin: 3.2 g/dL — ABNORMAL LOW (ref 3.5–5.2)
BUN: 6 mg/dL (ref 6–23)
Chloride: 105 mEq/L (ref 96–112)
Creatinine, Ser: 0.8 mg/dL (ref 0.50–1.35)
Total Protein: 6.2 g/dL (ref 6.0–8.3)

## 2012-05-30 LAB — CBC WITH DIFFERENTIAL/PLATELET
Basophils Absolute: 0.1 10*3/uL (ref 0.0–0.1)
Basophils Relative: 1 % (ref 0–1)
Eosinophils Absolute: 0.2 10*3/uL (ref 0.0–0.7)
Hemoglobin: 12.5 g/dL — ABNORMAL LOW (ref 13.0–17.0)
MCH: 30.8 pg (ref 26.0–34.0)
MCHC: 33.7 g/dL (ref 30.0–36.0)
Monocytes Absolute: 0.6 10*3/uL (ref 0.1–1.0)
Monocytes Relative: 9 % (ref 3–12)
Neutro Abs: 3.3 10*3/uL (ref 1.7–7.7)
Neutrophils Relative %: 50 % (ref 43–77)
RDW: 14.4 % (ref 11.5–15.5)

## 2012-05-30 LAB — PROTIME-INR
INR: 0.97 (ref 0.00–1.49)
Prothrombin Time: 13.1 seconds (ref 11.6–15.2)

## 2012-05-30 LAB — POCT I-STAT TROPONIN I
Troponin i, poc: 0 ng/mL (ref 0.00–0.08)
Troponin i, poc: 0 ng/mL (ref 0.00–0.08)

## 2012-05-30 MED ORDER — OXYCODONE HCL 30 MG PO TABS
30.0000 mg | ORAL_TABLET | ORAL | Status: AC | PRN
Start: 2012-05-30 — End: 2012-06-09

## 2012-05-30 MED ORDER — HYDROMORPHONE HCL PF 1 MG/ML IJ SOLN
0.5000 mg | INTRAMUSCULAR | Status: DC | PRN
  Administered 2012-05-30: 18:00:00 via INTRAVENOUS
  Filled 2012-05-30: qty 1

## 2012-05-30 MED ORDER — IOHEXOL 350 MG/ML SOLN
100.0000 mL | Freq: Once | INTRAVENOUS | Status: AC | PRN
  Administered 2012-05-30: 100 mL via INTRAVENOUS

## 2012-05-30 NOTE — ED Notes (Signed)
Patient in CT at this time.

## 2012-05-30 NOTE — ED Notes (Signed)
Patient returned from CT

## 2012-05-30 NOTE — ED Notes (Addendum)
Pt presents with multiple complaints.  Pt reports he has been out of pain medication x 3 days, reports chills and vomiting.  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  Pt reports he can't get in to see PCP or pain clinic today.

## 2012-05-30 NOTE — ED Provider Notes (Signed)
History     CSN: [id number]  Arrival date & time 05/30/12  1121   First MD Initiated Contact with Patient 05/30/12 1624      Chief Complaint  Patient presents with  . Pain    (Consider location/radiation/quality/duration/timing/severity/associated sxs/prior treatment) HPI  This is a 51 year old man, with a past medical history of recurrent pulmonary emboli, metastatic lung cancer, history of myocardial infarction, and chronic low back pain and weakness in his lower extremiteis.   Low Back Pain He presents today with complaints of severe low back pain after running out of his pain medications 3 days ago. The pain has been severe especially in the back, reaching a scale of 10 out of 10 with a reduced sleep. It is increased by lying still and assuming specific positions. His pain has been incapacitating for the last several months to the extent that he requires a walker of a cane to get around. He reports numbness involving the lower extremities especially the right side.  Chest Pain: He also complains of chronic chest pains and difficulty breathing, without palpitations. The pain is sharp, and is located in the left side of his chest about 6/10. The pain does not radiate to his left arm left jaw. It is not increased by exertion and he does not report any relieving factors. He also reports shortness of breath especially on exertion. He is on home oxygen therapy with self reported good oxygen saturation at rest, but rapidly desaturating on minimal activity. He admits to a history of hemoptysis and cough for the last one week. He denies history of PND, lower extremity edema, but reports orthopnea. No fever or chills.   Cancer: The patient reports that was diagnosed of metastatic recurrence of the lung and he was sent to hospice where he discharged himself after 8 months. He also reports that he had a recent CT scan of the abdomen that showed a lesion and the liver, but his primary PCP  recommended no biopsy in the setting of unknown lung cancer and limited benefit from such an invasive procedure..   Past Medical History  Diagnosis Date  . Deep vein thrombosis   . Pulmonary emboli   . COPD (chronic obstructive pulmonary disease)   . Partial small bowel obstruction   . Myocardial infarction 2007; ~ 2011  . Stroke ~ 03/2012    "can't use my left hand"  . Chronic liver failure   . Liver cancer   . Metastatic lung cancer   . Shortness of breath     "all the time"  . Oxygen dependent     "I use it at home all the time"  . History of blood transfusion   . Hepatitis C   . Migraines     "used to have them all the time"  . Arthritis     "I'm eat up w/it"  . Depression     Past Surgical History  Procedure Date  . Prostate surgery 2012  . Tonsillectomy and adenoidectomy 1974    No family history on file.  History  Substance Use Topics  . Smoking status: Current Everyday Smoker -- 0.5 packs/day for 34 years    Types: Cigarettes  . Smokeless tobacco: Never Used  . Alcohol Use: 2.4 oz/week    4 Glasses of wine per week     04/18/12 "drink 3-4 wine coolers/wk"      Review of Systems  Constitutional: Negative.   HENT: Negative.   Eyes: Negative.   Respiratory:  Negative for choking, chest tightness, wheezing and stridor.   Cardiovascular: Negative for leg swelling.  Gastrointestinal: Negative.  Negative for abdominal pain, blood in stool and anal bleeding.  Genitourinary: Negative.   Musculoskeletal: Negative for myalgias and joint swelling.  Skin: Negative.   Neurological: Positive for numbness. Negative for tremors, seizures, syncope, facial asymmetry, speech difficulty and headaches.  Hematological: Does not bruise/bleed easily.  Psychiatric/Behavioral: Negative.     Allergies  Morphine and related  Home Medications   Current Outpatient Rx  Name Route Sig Dispense Refill  . CYANOCOBALAMIN 1000 MCG/ML IJ SOLN Intramuscular Inject 1,000 mcg into  the muscle once a week. On monday    . DULOXETINE HCL 60 MG PO CPEP Oral Take 60 mg by mouth 2 (two) times daily.    Marland Kitchen FOLIC ACID 1 MG PO TABS Oral Take 1 mg by mouth daily.    Marland Kitchen GABAPENTIN 300 MG PO CAPS Oral Take 300 mg by mouth 4 (four) times daily.    Marland Kitchen HYDROCODONE-ACETAMINOPHEN 10-325 MG PO TABS Oral Take 2 tablets by mouth 4 (four) times daily.    Marland Kitchen OMEPRAZOLE 20 MG PO CPDR Oral Take 20 mg by mouth 2 (two) times daily.    . OXYCODONE HCL ER 20 MG PO TB12 Oral Take 1 tablet (20 mg total) by mouth every 12 (twelve) hours. 30 tablet 0  . OXYCODONE HCL 30 MG PO TABS Oral Take 30 mg by mouth every 4 (four) hours as needed. For pain    . POTASSIUM CHLORIDE CRYS ER 20 MEQ PO TBCR Oral Take 20 mEq by mouth at bedtime.     Marland Kitchen PREGABALIN 75 MG PO CAPS Oral Take 1 capsule (75 mg total) by mouth daily. 30 capsule 0  . TAMSULOSIN HCL 0.4 MG PO CAPS Oral Take 0.4 mg by mouth at bedtime.     . WARFARIN SODIUM 5 MG PO TABS Oral Take 7.5 mg by mouth daily.      BP 218/132  Pulse 66  Temp 98.1 F (36.7 C) (Oral)  Resp 18  Ht 5\' 7"  (1.702 m)  Wt 150 lb (68.04 kg)  BMI 23.49 kg/m2  SpO2 99%  Physical Exam  Constitutional: He is oriented to person, place, and time. He appears well-developed and well-nourished. No distress.  HENT:  Head: Normocephalic and atraumatic.  Eyes: Conjunctivae are normal. Pupils are equal, round, and reactive to light.  Neck: Normal range of motion. Neck supple.  Cardiovascular: Normal rate, regular rhythm and normal heart sounds.  Exam reveals no friction rub.   No murmur heard. Pulmonary/Chest: Effort normal and breath sounds normal. No respiratory distress. He has no wheezes. He has no rales. He exhibits no tenderness.  Abdominal: Soft. Bowel sounds are normal.  Genitourinary: Rectum normal and penis normal.  Musculoskeletal: He exhibits no edema and no tenderness.       Thoracic back: He exhibits no tenderness, no bony tenderness, no swelling, no edema, no deformity  and no laceration.       Lumbar back: He exhibits tenderness. He exhibits normal range of motion, no bony tenderness, no swelling, no edema, no deformity, no laceration, no pain, no spasm and normal pulse.  Neurological: He is alert and oriented to person, place, and time. No cranial nerve deficit. Coordination normal.  Skin: Skin is warm and dry. He is not diaphoretic.  Psychiatric: He has a normal mood and affect.    ED Course  Procedures (including critical care time)  Labs Reviewed  CBC  WITH DIFFERENTIAL - Abnormal; Notable for the following:    RBC 4.06 (*)     Hemoglobin 12.5 (*)     HCT 37.1 (*)     All other components within normal limits  COMPREHENSIVE METABOLIC PANEL - Abnormal; Notable for the following:    Albumin 3.2 (*)     Total Bilirubin 0.1 (*)     All other components within normal limits  PROTIME-INR  POCT I-STAT TROPONIN I   No results found.    Date: 05/30/2012  Rate: 87  Rhythm: normal sinus rhythm  QRS Axis: normal  Intervals: normal  ST/T Wave abnormalities: normal  Conduction Disutrbances:none  Narrative Interpretation:   Old EKG Reviewed: appears to be evolving LVH   MDM   Chest pain. In this patient with active malignancy, cigarette smoking, and a history of PEs, who presents with hemoptysis, coughing, shortness of breath, and the chest pains, I am concerned about the possibility of a PE versus cardiac ischemia. His vital signs are normal except increased BP which might be due to pain. I have ordered a CT angiogram of the chest, and INR level.  Low back pain. He reports chronic low back pain, which has increased after running out of medication. I don't suspect acute complication from malignancy such as spinal cord compression with examination that does not reveal neurological signs. The associated history of bilateral lower extremity weakness is most likely due to neuropathies. Patient is on gabapentin prescribed by his primary physician. I will  manage him with pain medications in the ED.   10:41 PM After reviewing results of negative CT angiogram of the chest, together with negative troponins, this patient does not have a cardia schemia or PE. I discharged him with pain medication. He will follow up with his regular doctor.     Dow Adolph, MD 05/30/12 [phone]

## 2012-05-31 NOTE — ED Provider Notes (Signed)
Medical screening examination/treatment/procedure(s) were conducted as a shared visit with non-physician practitioner(s) and myself.  I personally evaluated the patient during the encounter.  Pt examined, no focal findings on exam.  CT neg for PE.  INR is therapeutic.  Plan symptomatic control.  Pt will f/u with his PMD.  Provided 3 days of his home meds.  Tobin Chad, MD 05/31/12 [phone]

## 2013-05-15 ENCOUNTER — Encounter (HOSPITAL_COMMUNITY): Payer: Self-pay | Admitting: Emergency Medicine

## 2013-05-15 ENCOUNTER — Emergency Department (HOSPITAL_COMMUNITY)
Admission: EM | Admit: 2013-05-15 | Discharge: 2013-05-15 | Disposition: A | Discharge status: Home / Self Care | Payer: Medicaid Other | Attending: Emergency Medicine | Admitting: Emergency Medicine

## 2013-05-15 DIAGNOSIS — Z8673 Personal history of transient ischemic attack (TIA), and cerebral infarction without residual deficits: Secondary | ICD-10-CM | POA: Insufficient documentation

## 2013-05-15 DIAGNOSIS — Z8619 Personal history of other infectious and parasitic diseases: Secondary | ICD-10-CM | POA: Insufficient documentation

## 2013-05-15 DIAGNOSIS — F17200 Nicotine dependence, unspecified, uncomplicated: Secondary | ICD-10-CM | POA: Insufficient documentation

## 2013-05-15 DIAGNOSIS — R300 Dysuria: Secondary | ICD-10-CM | POA: Insufficient documentation

## 2013-05-15 DIAGNOSIS — Z8719 Personal history of other diseases of the digestive system: Secondary | ICD-10-CM | POA: Insufficient documentation

## 2013-05-15 DIAGNOSIS — Z86718 Personal history of other venous thrombosis and embolism: Secondary | ICD-10-CM | POA: Insufficient documentation

## 2013-05-15 DIAGNOSIS — Z79899 Other long term (current) drug therapy: Secondary | ICD-10-CM | POA: Insufficient documentation

## 2013-05-15 DIAGNOSIS — Z8505 Personal history of malignant neoplasm of liver: Secondary | ICD-10-CM | POA: Insufficient documentation

## 2013-05-15 DIAGNOSIS — F329 Major depressive disorder, single episode, unspecified: Secondary | ICD-10-CM | POA: Insufficient documentation

## 2013-05-15 DIAGNOSIS — M549 Dorsalgia, unspecified: Secondary | ICD-10-CM | POA: Insufficient documentation

## 2013-05-15 DIAGNOSIS — I252 Old myocardial infarction: Secondary | ICD-10-CM | POA: Insufficient documentation

## 2013-05-15 DIAGNOSIS — R339 Retention of urine, unspecified: Secondary | ICD-10-CM | POA: Insufficient documentation

## 2013-05-15 DIAGNOSIS — Z86711 Personal history of pulmonary embolism: Secondary | ICD-10-CM | POA: Insufficient documentation

## 2013-05-15 DIAGNOSIS — G43909 Migraine, unspecified, not intractable, without status migrainosus: Secondary | ICD-10-CM | POA: Insufficient documentation

## 2013-05-15 DIAGNOSIS — R141 Gas pain: Secondary | ICD-10-CM | POA: Insufficient documentation

## 2013-05-15 DIAGNOSIS — R142 Eructation: Secondary | ICD-10-CM | POA: Insufficient documentation

## 2013-05-15 DIAGNOSIS — M129 Arthropathy, unspecified: Secondary | ICD-10-CM | POA: Insufficient documentation

## 2013-05-15 DIAGNOSIS — J441 Chronic obstructive pulmonary disease with (acute) exacerbation: Secondary | ICD-10-CM | POA: Insufficient documentation

## 2013-05-15 DIAGNOSIS — F3289 Other specified depressive episodes: Secondary | ICD-10-CM | POA: Insufficient documentation

## 2013-05-15 LAB — CBC WITH DIFFERENTIAL/PLATELET
Basophils Absolute: 0 10*3/uL (ref 0.0–0.1)
Basophils Relative: 0 % (ref 0–1)
Eosinophils Absolute: 0.5 10*3/uL (ref 0.0–0.7)
MCH: 29.3 pg (ref 26.0–34.0)
MCHC: 34.7 g/dL (ref 30.0–36.0)
Monocytes Relative: 17 % — ABNORMAL HIGH (ref 3–12)
Neutro Abs: 5.7 10*3/uL (ref 1.7–7.7)
Neutrophils Relative %: 62 % (ref 43–77)
Platelets: 233 10*3/uL (ref 150–400)
RDW: 15.4 % (ref 11.5–15.5)

## 2013-05-15 LAB — COMPREHENSIVE METABOLIC PANEL
AST: 71 U/L — ABNORMAL HIGH (ref 0–37)
Albumin: 3 g/dL — ABNORMAL LOW (ref 3.5–5.2)
Alkaline Phosphatase: 66 U/L (ref 39–117)
BUN: 8 mg/dL (ref 6–23)
Potassium: 3.8 mEq/L (ref 3.5–5.1)
Sodium: 134 mEq/L — ABNORMAL LOW (ref 135–145)
Total Protein: 6.3 g/dL (ref 6.0–8.3)

## 2013-05-15 LAB — URINE MICROSCOPIC-ADD ON

## 2013-05-15 LAB — URINALYSIS, ROUTINE W REFLEX MICROSCOPIC
Bilirubin Urine: NEGATIVE
Glucose, UA: NEGATIVE mg/dL
Ketones, ur: NEGATIVE mg/dL
Nitrite: NEGATIVE
pH: 6 (ref 5.0–8.0)

## 2013-05-15 LAB — POCT I-STAT TROPONIN I

## 2013-05-15 MED ORDER — OXYCODONE HCL 5 MG PO TABS
15.0000 mg | ORAL_TABLET | Freq: Once | ORAL | Status: AC
  Administered 2013-05-15: 15 mg via ORAL
  Filled 2013-05-15: qty 3

## 2013-05-15 MED ORDER — CIPROFLOXACIN HCL 500 MG PO TABS: 500.0000 mg | ORAL_TABLET | Freq: Two times a day (BID) | ORAL | Status: DC

## 2013-05-15 MED ORDER — PROMETHAZINE HCL 12.5 MG PO TABS
12.5000 mg | ORAL_TABLET | Freq: Once | ORAL | Status: AC
  Administered 2013-05-15: 12.5 mg via ORAL
  Filled 2013-05-15: qty 1

## 2013-05-15 NOTE — ED Notes (Signed)
abd pain and cannot pee having to cath himself having cp went to [place] sat evening

## 2013-05-15 NOTE — ED Notes (Signed)
Pt provided with information on catheter care and how to empty leg bag. Pt will follow up with nephrology.

## 2013-05-15 NOTE — ED Provider Notes (Signed)
CSN: [id number]     Arrival date & time 05/15/13  1452 History     First MD Initiated Contact with Patient 05/15/13 1528     Chief Complaint  Patient presents with  . Abdominal Pain   (Consider location/radiation/quality/duration/timing/severity/associated sxs/prior Treatment) Patient is a 52 y.o. male presenting with abdominal pain.  Abdominal Pain Associated symptoms: no chest pain, no chills, no constipation, no cough, no diarrhea, no dysuria, no fever, no hematuria, no nausea, no shortness of breath, no sore throat and no vomiting    Robert [name] is a 52 year old male with a PMH of Metastatic lung cancer, DVT/PE on coumadin, COPD, and prostate surgery presents to the ED with chief compliant of being unable to urinate.  He reports that he saw his PCP a few days ago and told him he was having trouble urinating and his PCP referred him to  hospital.  The patient was dissatisfied with [facility] and decided to have someone drive him to [facility]. He reports that he has had a history of urinary retention and that about 1.5 years ago he had a prostate surgery.  He reports that after that surgery he had to self cath for a few weeks, he continues to take Flomax for urinary issues, he does not believe that he was diagnosed with prostate cancer.  He reports that with the Flomax he was symptom free for a long time until recently.  He reports that 4 days ago he noticed that it was very difficult to urinate and he could only do so sitting down.  Then he reports that two days ago he started self cathing again, and for the past day he has not passed any urine on his own.  He reports some abdominal pain associated with when his bladder is full and he said this pain can radiate to his right shoulder.  Past Medical History  Diagnosis Date  . Deep vein thrombosis   . Pulmonary emboli   . COPD (chronic obstructive pulmonary disease)   . Partial small bowel obstruction   . Myocardial infarction 2007;  ~ 2011  . Stroke ~ 03/2012    "can't use my left hand"  . Chronic liver failure   . Liver cancer   . Metastatic lung cancer   . Shortness of breath     "all the time"  . Oxygen dependent     "I use it at home all the time"  . History of blood transfusion   . Hepatitis C   . Migraines     "used to have them all the time"  . Arthritis     "I'm eat up w/it"  . Depression    Past Surgical History  Procedure Laterality Date  . Prostate surgery  2012  . Tonsillectomy and adenoidectomy  1974   No family history on file. History  Substance Use Topics  . Smoking status: Current Every Day Smoker -- 0.50 packs/day for 34 years    Types: Cigarettes  . Smokeless tobacco: Never Used  . Alcohol Use: 2.4 oz/week    4 Glasses of wine per week     Comment: 04/18/12 "drink 3-4 wine coolers/wk"    Review of Systems  Constitutional: Negative for fever, chills and activity change.  HENT: Negative for congestion, sore throat and neck pain.   Eyes: Negative for visual disturbance.  Respiratory: Negative for cough, chest tightness and shortness of breath.   Cardiovascular: Negative for chest pain, palpitations and leg swelling.  Gastrointestinal: Positive for abdominal pain and abdominal distention. Negative for nausea, vomiting, diarrhea and constipation.  Endocrine: Negative for polydipsia.  Genitourinary: Positive for difficulty urinating. Negative for dysuria, urgency, frequency, hematuria and discharge.  Musculoskeletal: Positive for back pain.  Skin: Negative for wound.  Neurological: Negative for dizziness, weakness, light-headedness and numbness.  Psychiatric/Behavioral: Negative for confusion.    Allergies  Morphine and related  Home Medications   Current Outpatient Rx  Name  Route  Sig  Dispense  Refill  . ALPRAZolam (XANAX) 1 MG tablet   Oral   Take 1 mg by mouth 2 (two) times daily as needed for sleep.         . cyanocobalamin (,VITAMIN B-12,) 1000 MCG/ML injection    Intramuscular   Inject 1,000 mcg into the muscle once a week. On monday         . DULoxetine (CYMBALTA) 60 MG capsule   Oral   Take 60 mg by mouth 2 (two) times daily.         . folic acid (FOLVITE) 1 MG tablet   Oral   Take 1 mg by mouth daily.         Marland Kitchen gabapentin (NEURONTIN) 400 MG capsule   Oral   Take 400 mg by mouth 3 (three) times daily.         Marland Kitchen levothyroxine (SYNTHROID, LEVOTHROID) 50 MCG tablet   Oral   Take 50 mcg by mouth daily before breakfast.         . Multiple Vitamins-Minerals (MULTIVITAMIN WITH MINERALS) tablet   Oral   Take 1 tablet by mouth daily.         Marland Kitchen OLANZapine (ZYPREXA) 5 MG tablet   Oral   Take 5-10 mg by mouth at bedtime.         Marland Kitchen omeprazole (PRILOSEC) 20 MG capsule   Oral   Take 20 mg by mouth 2 (two) times daily.         Marland Kitchen oxyCODONE (OXYCONTIN) 20 MG 12 hr tablet   Oral   Take 1 tablet (20 mg total) by mouth every 12 (twelve) hours.   30 tablet   0   . oxycodone (ROXICODONE) 30 MG immediate release tablet   Oral   Take 30 mg by mouth every 4 (four) hours as needed for pain.         . potassium chloride SA (K-DUR,KLOR-CON) 20 MEQ tablet   Oral   Take 20 mEq by mouth at bedtime.          . pregabalin (LYRICA) 75 MG capsule   Oral   Take 75 mg by mouth daily.         . Tamsulosin HCl (FLOMAX) 0.4 MG CAPS   Oral   Take 0.4 mg by mouth at bedtime.          Marland Kitchen warfarin (COUMADIN) 5 MG tablet   Oral   Take 7.5 mg by mouth daily.          BP 106/80  Pulse 94  Temp(Src) 98.2 F (36.8 C)  Resp 14  SpO2 99% Physical Exam  Nursing note and vitals reviewed. Constitutional: He is oriented to person, place, and time. He appears well-developed and well-nourished. No distress.  HENT:  Head: Normocephalic and atraumatic.  Eyes: Conjunctivae and EOM are normal.  Cardiovascular: Normal rate, regular rhythm and normal heart sounds.   No murmur heard. Pulmonary/Chest: Effort normal and breath sounds normal.  No respiratory distress. He has no  wheezes. He has no rales. He exhibits no tenderness.  Abdominal: Soft. Bowel sounds are normal. He exhibits no distension. There is tenderness (mild discomfort suprapubic palpation.). There is no rebound and no guarding.  Musculoskeletal: He exhibits no edema.  Neurological: He is alert and oriented to person, place, and time.  Skin: Skin is warm and dry. He is not diaphoretic.  Psychiatric: He has a normal mood and affect. His behavior is normal. Judgment and thought content normal.    ED Course   Procedures (including critical care time)  Date: 05/15/2013  Rate: 88  Rhythm: normal sinus rhythm  QRS Axis: normal  Intervals: QT prolonged  ST/T Wave abnormalities: normal  Conduction Disutrbances:none  Narrative Interpretation:   Old EKG Reviewed: unchanged 06/03/2012   Labs Reviewed  CBC WITH DIFFERENTIAL - Abnormal; Notable for the following:    RBC 3.79 (*)    Hemoglobin 11.1 (*)    HCT 32.0 (*)    Monocytes Relative 17 (*)    Monocytes Absolute 1.6 (*)    Eosinophils Relative 6 (*)    All other components within normal limits  COMPREHENSIVE METABOLIC PANEL - Abnormal; Notable for the following:    Sodium 134 (*)    Chloride 94 (*)    Albumin 3.0 (*)    AST 71 (*)    All other components within normal limits  URINE CULTURE  URINALYSIS, ROUTINE W REFLEX MICROSCOPIC  POCT I-STAT TROPONIN I   No results found. 1. Urine retention     MDM  52 year old male with pmh of Chronic liver failure, Metastatic lung cancer, DVT/PE on A/C. Who presents with difficulty urinating and abdominal pain.  Patient is not tachycardic or tachpneic . He is not actively complaining of chest pain.  EKG shows no acute changes and I-stat troponin is negative.  Unlikely ACS.  A bedside abdominal ultrasound showed a full bladder.  CBC showed stable anemia, no leukocytosis, CMP showed no AKI, or acute abnormalities.  Will place foley catheter with leg bag in patient and  obtain urine culture.  Patient instructed that we cannot rule out the possibility of prostate cancer causing his symptoms and proper follow-up with urology is very important.  Patient will be discharged with instructions to keep foley catheter in place for 7-10 days and with a prescription for 10 days of cipro.  Patient instructed to continue Flowmax as prescribed and follow up with his urologist in 1 week.    Carlynn Purl, DO 05/15/13 1939

## 2013-05-15 NOTE — ED Provider Notes (Addendum)
Please see above Dr. Mikey Bussing. Concur with his examination and findings. Examine the patient independently. A bedside ultrasound shows large bladder distention. Foley catheter was placed. He was decompressed. His symptoms are improved. He was given some Phenergan for nausea. He states he feels better. We have encouraged him to follow up with urology. He states he did have a prostate exam with his urologist 2 years ago. We did discuss with him the possible possibility for retention of prostate cancer and he should be evaluated as soon as possible. He'll use a Foley and a leg bag until followup, a minimum of 4-5 days. Dx:   is urinary retention  Claudean Kinds, MD 05/15/13 1846  EKG normal sinus rhythm borderline prolonged QT no acute ischemic changes no injury . indication shortness of breath  Claudean Kinds, MD 05/15/13 2046
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
## 2013-05-16 LAB — URINE CULTURE

## 2013-05-16 NOTE — ED Provider Notes (Signed)
Medical screening examination/treatment/procedure(s) were conducted as a shared visit with non-physician practitioner(s) and myself.  I personally evaluated the patient during the encounter.  Pt examined.  Re evaluated after interventions.  DC  And F/U discussed.    Claudean Kinds, MD 05/16/13 [id number]

## 2014-01-03 ENCOUNTER — Encounter (HOSPITAL_BASED_OUTPATIENT_CLINIC_OR_DEPARTMENT_OTHER): Payer: Self-pay | Admitting: Emergency Medicine

## 2014-01-03 ENCOUNTER — Emergency Department (HOSPITAL_BASED_OUTPATIENT_CLINIC_OR_DEPARTMENT_OTHER)
Admission: EM | Admit: 2014-01-03 | Discharge: 2014-01-03 | Disposition: A | Discharge status: Home / Self Care | Payer: Medicaid Other | Attending: Emergency Medicine | Admitting: Emergency Medicine

## 2014-01-03 DIAGNOSIS — F329 Major depressive disorder, single episode, unspecified: Secondary | ICD-10-CM | POA: Insufficient documentation

## 2014-01-03 DIAGNOSIS — F17200 Nicotine dependence, unspecified, uncomplicated: Secondary | ICD-10-CM | POA: Insufficient documentation

## 2014-01-03 DIAGNOSIS — J4489 Other specified chronic obstructive pulmonary disease: Secondary | ICD-10-CM | POA: Insufficient documentation

## 2014-01-03 DIAGNOSIS — Z792 Long term (current) use of antibiotics: Secondary | ICD-10-CM | POA: Insufficient documentation

## 2014-01-03 DIAGNOSIS — Z8673 Personal history of transient ischemic attack (TIA), and cerebral infarction without residual deficits: Secondary | ICD-10-CM | POA: Insufficient documentation

## 2014-01-03 DIAGNOSIS — J449 Chronic obstructive pulmonary disease, unspecified: Secondary | ICD-10-CM | POA: Insufficient documentation

## 2014-01-03 DIAGNOSIS — Z8739 Personal history of other diseases of the musculoskeletal system and connective tissue: Secondary | ICD-10-CM | POA: Insufficient documentation

## 2014-01-03 DIAGNOSIS — Z85118 Personal history of other malignant neoplasm of bronchus and lung: Secondary | ICD-10-CM | POA: Insufficient documentation

## 2014-01-03 DIAGNOSIS — Z8505 Personal history of malignant neoplasm of liver: Secondary | ICD-10-CM | POA: Insufficient documentation

## 2014-01-03 DIAGNOSIS — Z79899 Other long term (current) drug therapy: Secondary | ICD-10-CM | POA: Insufficient documentation

## 2014-01-03 DIAGNOSIS — Z9981 Dependence on supplemental oxygen: Secondary | ICD-10-CM | POA: Insufficient documentation

## 2014-01-03 DIAGNOSIS — G8929 Other chronic pain: Secondary | ICD-10-CM

## 2014-01-03 DIAGNOSIS — Z8619 Personal history of other infectious and parasitic diseases: Secondary | ICD-10-CM | POA: Insufficient documentation

## 2014-01-03 DIAGNOSIS — Z86711 Personal history of pulmonary embolism: Secondary | ICD-10-CM | POA: Insufficient documentation

## 2014-01-03 DIAGNOSIS — Z8719 Personal history of other diseases of the digestive system: Secondary | ICD-10-CM | POA: Insufficient documentation

## 2014-01-03 DIAGNOSIS — F3289 Other specified depressive episodes: Secondary | ICD-10-CM | POA: Insufficient documentation

## 2014-01-03 DIAGNOSIS — R209 Unspecified disturbances of skin sensation: Secondary | ICD-10-CM | POA: Insufficient documentation

## 2014-01-03 DIAGNOSIS — I252 Old myocardial infarction: Secondary | ICD-10-CM | POA: Insufficient documentation

## 2014-01-03 DIAGNOSIS — Z7901 Long term (current) use of anticoagulants: Secondary | ICD-10-CM | POA: Insufficient documentation

## 2014-01-03 DIAGNOSIS — G43909 Migraine, unspecified, not intractable, without status migrainosus: Secondary | ICD-10-CM | POA: Insufficient documentation

## 2014-01-03 MED ORDER — OXYCODONE HCL 5 MG PO TABS
30.0000 mg | ORAL_TABLET | Freq: Once | ORAL | Status: AC
  Administered 2014-01-03: 30 mg via ORAL
  Filled 2014-01-03: qty 6

## 2014-01-03 MED ORDER — OXYCODONE HCL 30 MG PO TABS: ORAL_TABLET | ORAL | Status: DC
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
## 2014-01-03 NOTE — Progress Notes (Signed)
ED CM received call from [facility] in [place] to verify discharge prescription  oxyCODONE (Oxy IR/ROXICODONE) immediate release tablet 30 mg
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
## 2014-01-22 ENCOUNTER — Emergency Department (HOSPITAL_BASED_OUTPATIENT_CLINIC_OR_DEPARTMENT_OTHER): Payer: Medicaid Other

## 2014-01-22 ENCOUNTER — Emergency Department (HOSPITAL_BASED_OUTPATIENT_CLINIC_OR_DEPARTMENT_OTHER)
Admission: EM | Admit: 2014-01-22 | Discharge: 2014-01-22 | Discharge status: Against Medical Advice | Payer: Medicaid Other | Attending: Emergency Medicine | Admitting: Emergency Medicine

## 2014-01-22 ENCOUNTER — Encounter (HOSPITAL_BASED_OUTPATIENT_CLINIC_OR_DEPARTMENT_OTHER): Payer: Self-pay | Admitting: Emergency Medicine

## 2014-01-22 DIAGNOSIS — F329 Major depressive disorder, single episode, unspecified: Secondary | ICD-10-CM | POA: Insufficient documentation

## 2014-01-22 DIAGNOSIS — Y939 Activity, unspecified: Secondary | ICD-10-CM | POA: Insufficient documentation

## 2014-01-22 DIAGNOSIS — G8929 Other chronic pain: Secondary | ICD-10-CM | POA: Insufficient documentation

## 2014-01-22 DIAGNOSIS — Z8673 Personal history of transient ischemic attack (TIA), and cerebral infarction without residual deficits: Secondary | ICD-10-CM | POA: Insufficient documentation

## 2014-01-22 DIAGNOSIS — IMO0002 Reserved for concepts with insufficient information to code with codable children: Secondary | ICD-10-CM | POA: Insufficient documentation

## 2014-01-22 DIAGNOSIS — Z8619 Personal history of other infectious and parasitic diseases: Secondary | ICD-10-CM | POA: Insufficient documentation

## 2014-01-22 DIAGNOSIS — F17200 Nicotine dependence, unspecified, uncomplicated: Secondary | ICD-10-CM | POA: Insufficient documentation

## 2014-01-22 DIAGNOSIS — I252 Old myocardial infarction: Secondary | ICD-10-CM | POA: Insufficient documentation

## 2014-01-22 DIAGNOSIS — Y929 Unspecified place or not applicable: Secondary | ICD-10-CM | POA: Insufficient documentation

## 2014-01-22 DIAGNOSIS — Z86718 Personal history of other venous thrombosis and embolism: Secondary | ICD-10-CM | POA: Insufficient documentation

## 2014-01-22 DIAGNOSIS — Z9981 Dependence on supplemental oxygen: Secondary | ICD-10-CM | POA: Insufficient documentation

## 2014-01-22 DIAGNOSIS — Z792 Long term (current) use of antibiotics: Secondary | ICD-10-CM | POA: Insufficient documentation

## 2014-01-22 DIAGNOSIS — Z85118 Personal history of other malignant neoplasm of bronchus and lung: Secondary | ICD-10-CM | POA: Insufficient documentation

## 2014-01-22 DIAGNOSIS — G43909 Migraine, unspecified, not intractable, without status migrainosus: Secondary | ICD-10-CM | POA: Insufficient documentation

## 2014-01-22 DIAGNOSIS — Z86711 Personal history of pulmonary embolism: Secondary | ICD-10-CM | POA: Insufficient documentation

## 2014-01-22 DIAGNOSIS — Z8719 Personal history of other diseases of the digestive system: Secondary | ICD-10-CM | POA: Insufficient documentation

## 2014-01-22 DIAGNOSIS — J441 Chronic obstructive pulmonary disease with (acute) exacerbation: Secondary | ICD-10-CM | POA: Insufficient documentation

## 2014-01-22 DIAGNOSIS — Z79899 Other long term (current) drug therapy: Secondary | ICD-10-CM | POA: Insufficient documentation

## 2014-01-22 DIAGNOSIS — R296 Repeated falls: Secondary | ICD-10-CM | POA: Insufficient documentation

## 2014-01-22 DIAGNOSIS — Z7901 Long term (current) use of anticoagulants: Secondary | ICD-10-CM | POA: Insufficient documentation

## 2014-01-22 DIAGNOSIS — Z8505 Personal history of malignant neoplasm of liver: Secondary | ICD-10-CM | POA: Insufficient documentation

## 2014-01-22 DIAGNOSIS — F3289 Other specified depressive episodes: Secondary | ICD-10-CM | POA: Insufficient documentation

## 2014-01-22 DIAGNOSIS — Z8739 Personal history of other diseases of the musculoskeletal system and connective tissue: Secondary | ICD-10-CM | POA: Insufficient documentation

## 2014-01-22 MED ORDER — SODIUM CHLORIDE 0.9 % IV BOLUS (SEPSIS): 1000.0000 mL | Freq: Once | INTRAVENOUS | Status: DC
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
## 2014-01-22 NOTE — ED Notes (Signed)
Pt presents w/ c/o chronic pain d/t hx of cancer - per pt, pt has extensive past medical history w/ multiple kinds of cancers, pt's spouse at bedside states pt has been falling and experiencing episodes of incontinence. Pt admits he has recently moved to [place] from [place] and sees his PCP there, pt has appointment on 02/05/14 however needs additional pain medication - per paperwork that pt provider this RN w/ - pt was dispensed x180 tabs of 30mg  oxycontin on 12/26/13 and is to take x6 per day for chronic pain. Pt is A&Ox4, no acute distress, keeps eyes closed during entire conversation and admits to generalized body aches and pains.

## 2014-01-22 NOTE — ED Notes (Signed)
Pt refusing blood work or fluids - leaving AMA - Dr. Doy Hutching, EDP made aware.

## 2014-01-22 NOTE — ED Notes (Signed)
Wife states he fell 2 days ago. Has been doubling up on his pain medication. Incontinent of urine. Speech is slurred. Sleeping in wheelchair.  States he has been taking Xanax but does not like it. Wife he is so SOB he cant breath even with his oxygen. Pt is not having any problem speaking on r/a. He did not bring his oxygen.

## 2014-05-25 DIAGNOSIS — I639 Cerebral infarction, unspecified: Secondary | ICD-10-CM

## 2014-05-25 HISTORY — DX: Cerebral infarction, unspecified: I63.9

## 2014-05-27 ENCOUNTER — Inpatient Hospital Stay (HOSPITAL_BASED_OUTPATIENT_CLINIC_OR_DEPARTMENT_OTHER)
Admission: EM | Admit: 2014-05-27 | Discharge: 2014-05-31 | DRG: 854 | Disposition: A | Discharge status: Home Health | Payer: Medicaid Other | Attending: Internal Medicine | Admitting: Internal Medicine

## 2014-05-27 ENCOUNTER — Encounter (HOSPITAL_BASED_OUTPATIENT_CLINIC_OR_DEPARTMENT_OTHER): Payer: Self-pay | Admitting: Emergency Medicine

## 2014-05-27 ENCOUNTER — Emergency Department (HOSPITAL_BASED_OUTPATIENT_CLINIC_OR_DEPARTMENT_OTHER): Payer: Medicaid Other

## 2014-05-27 DIAGNOSIS — C3490 Malignant neoplasm of unspecified part of unspecified bronchus or lung: Secondary | ICD-10-CM | POA: Diagnosis present

## 2014-05-27 DIAGNOSIS — R209 Unspecified disturbances of skin sensation: Secondary | ICD-10-CM | POA: Diagnosis present

## 2014-05-27 DIAGNOSIS — J449 Chronic obstructive pulmonary disease, unspecified: Secondary | ICD-10-CM

## 2014-05-27 DIAGNOSIS — I252 Old myocardial infarction: Secondary | ICD-10-CM | POA: Diagnosis not present

## 2014-05-27 DIAGNOSIS — L0390 Cellulitis, unspecified: Secondary | ICD-10-CM | POA: Insufficient documentation

## 2014-05-27 DIAGNOSIS — Z86711 Personal history of pulmonary embolism: Secondary | ICD-10-CM | POA: Diagnosis not present

## 2014-05-27 DIAGNOSIS — L03119 Cellulitis of unspecified part of limb: Secondary | ICD-10-CM | POA: Diagnosis present

## 2014-05-27 DIAGNOSIS — L03114 Cellulitis of left upper limb: Secondary | ICD-10-CM

## 2014-05-27 DIAGNOSIS — K219 Gastro-esophageal reflux disease without esophagitis: Secondary | ICD-10-CM | POA: Diagnosis present

## 2014-05-27 DIAGNOSIS — F3289 Other specified depressive episodes: Secondary | ICD-10-CM | POA: Diagnosis present

## 2014-05-27 DIAGNOSIS — N390 Urinary tract infection, site not specified: Secondary | ICD-10-CM

## 2014-05-27 DIAGNOSIS — E039 Hypothyroidism, unspecified: Secondary | ICD-10-CM | POA: Diagnosis present

## 2014-05-27 DIAGNOSIS — M545 Low back pain, unspecified: Secondary | ICD-10-CM | POA: Diagnosis present

## 2014-05-27 DIAGNOSIS — B1920 Unspecified viral hepatitis C without hepatic coma: Secondary | ICD-10-CM | POA: Diagnosis present

## 2014-05-27 DIAGNOSIS — Z7901 Long term (current) use of anticoagulants: Secondary | ICD-10-CM | POA: Diagnosis not present

## 2014-05-27 DIAGNOSIS — A419 Sepsis, unspecified organism: Secondary | ICD-10-CM | POA: Diagnosis present

## 2014-05-27 DIAGNOSIS — J4489 Other specified chronic obstructive pulmonary disease: Secondary | ICD-10-CM | POA: Diagnosis present

## 2014-05-27 DIAGNOSIS — Z79899 Other long term (current) drug therapy: Secondary | ICD-10-CM

## 2014-05-27 DIAGNOSIS — M7989 Other specified soft tissue disorders: Secondary | ICD-10-CM | POA: Diagnosis present

## 2014-05-27 DIAGNOSIS — Z86718 Personal history of other venous thrombosis and embolism: Secondary | ICD-10-CM | POA: Diagnosis not present

## 2014-05-27 DIAGNOSIS — C787 Secondary malignant neoplasm of liver and intrahepatic bile duct: Secondary | ICD-10-CM | POA: Diagnosis present

## 2014-05-27 DIAGNOSIS — K560 Paralytic ileus: Secondary | ICD-10-CM

## 2014-05-27 DIAGNOSIS — Z9981 Dependence on supplemental oxygen: Secondary | ICD-10-CM

## 2014-05-27 DIAGNOSIS — Z8673 Personal history of transient ischemic attack (TIA), and cerebral infarction without residual deficits: Secondary | ICD-10-CM

## 2014-05-27 DIAGNOSIS — Z79891 Long term (current) use of opiate analgesic: Secondary | ICD-10-CM

## 2014-05-27 DIAGNOSIS — L02519 Cutaneous abscess of unspecified hand: Secondary | ICD-10-CM | POA: Diagnosis present

## 2014-05-27 DIAGNOSIS — R471 Dysarthria and anarthria: Secondary | ICD-10-CM | POA: Diagnosis present

## 2014-05-27 DIAGNOSIS — I2699 Other pulmonary embolism without acute cor pulmonale: Secondary | ICD-10-CM

## 2014-05-27 DIAGNOSIS — M79604 Pain in right leg: Secondary | ICD-10-CM

## 2014-05-27 DIAGNOSIS — F329 Major depressive disorder, single episode, unspecified: Secondary | ICD-10-CM | POA: Diagnosis present

## 2014-05-27 DIAGNOSIS — I10 Essential (primary) hypertension: Secondary | ICD-10-CM | POA: Diagnosis present

## 2014-05-27 DIAGNOSIS — R202 Paresthesia of skin: Secondary | ICD-10-CM

## 2014-05-27 DIAGNOSIS — I2510 Atherosclerotic heart disease of native coronary artery without angina pectoris: Secondary | ICD-10-CM | POA: Diagnosis present

## 2014-05-27 DIAGNOSIS — R079 Chest pain, unspecified: Secondary | ICD-10-CM

## 2014-05-27 DIAGNOSIS — F17200 Nicotine dependence, unspecified, uncomplicated: Secondary | ICD-10-CM | POA: Diagnosis present

## 2014-05-27 HISTORY — DX: Unspecified asthma, uncomplicated: J45.909

## 2014-05-27 HISTORY — DX: Low back pain, unspecified: M54.50

## 2014-05-27 HISTORY — DX: Gastro-esophageal reflux disease without esophagitis: K21.9

## 2014-05-27 HISTORY — DX: Anxiety disorder, unspecified: F41.9

## 2014-05-27 HISTORY — DX: Other chronic pain: G89.29

## 2014-05-27 HISTORY — DX: Other specified health status: Z78.9

## 2014-05-27 HISTORY — DX: Deficiency of other specified B group vitamins: E53.8

## 2014-05-27 HISTORY — DX: Hypothyroidism, unspecified: E03.9

## 2014-05-27 HISTORY — DX: Low back pain: M54.5

## 2014-05-27 HISTORY — DX: Pneumonia, unspecified organism: J18.9

## 2014-05-27 HISTORY — DX: Essential (primary) hypertension: I10

## 2014-05-27 LAB — URINALYSIS, ROUTINE W REFLEX MICROSCOPIC
Bilirubin Urine: NEGATIVE
Glucose, UA: NEGATIVE mg/dL
Hgb urine dipstick: NEGATIVE
Ketones, ur: NEGATIVE mg/dL
NITRITE: POSITIVE — AB
PROTEIN: NEGATIVE mg/dL
SPECIFIC GRAVITY, URINE: 1.009 (ref 1.005–1.030)
UROBILINOGEN UA: 0.2 mg/dL (ref 0.0–1.0)
pH: 6 (ref 5.0–8.0)

## 2014-05-27 LAB — COMPREHENSIVE METABOLIC PANEL
ALBUMIN: 2.8 g/dL — AB (ref 3.5–5.2)
ALT: 31 U/L (ref 0–53)
ANION GAP: 16 — AB (ref 5–15)
AST: 43 U/L — AB (ref 0–37)
Alkaline Phosphatase: 66 U/L (ref 39–117)
BUN: 7 mg/dL (ref 6–23)
CHLORIDE: 98 meq/L (ref 96–112)
CO2: 23 mEq/L (ref 19–32)
CREATININE: 0.9 mg/dL (ref 0.50–1.35)
Calcium: 9.3 mg/dL (ref 8.4–10.5)
GFR calc Af Amer: >90 mL/min (ref >90)
GFR calc non Af Amer: >90 mL/min (ref >90)
Glucose, Bld: 93 mg/dL (ref 70–99)
POTASSIUM: 4.7 meq/L (ref 3.7–5.3)
Sodium: 137 mEq/L (ref 137–147)
TOTAL PROTEIN: 6.9 g/dL (ref 6.0–8.3)

## 2014-05-27 LAB — CBC WITH DIFFERENTIAL/PLATELET
BASOS PCT: 1 % (ref 0–1)
Basophils Absolute: 0.2 10*3/uL — ABNORMAL HIGH (ref 0.0–0.1)
EOS ABS: 0.4 10*3/uL (ref 0.0–0.7)
EOS PCT: 2 % (ref 0–5)
HCT: 39.3 % (ref 39.0–52.0)
HEMOGLOBIN: 13 g/dL (ref 13.0–17.0)
LYMPHS PCT: 7 % — AB (ref 12–46)
Lymphs Abs: 1.3 10*3/uL (ref 0.7–4.0)
MCH: 31.2 pg (ref 26.0–34.0)
MCHC: 33.1 g/dL (ref 30.0–36.0)
MCV: 94.2 fL (ref 78.0–100.0)
MONO ABS: 1.1 10*3/uL — AB (ref 0.1–1.0)
Monocytes Relative: 6 % (ref 3–12)
NEUTROS PCT: 84 % — AB (ref 43–77)
Neutro Abs: 15.6 10*3/uL — ABNORMAL HIGH (ref 1.7–7.7)
PLATELETS: 329 10*3/uL (ref 150–400)
RBC: 4.17 MIL/uL — AB (ref 4.22–5.81)
RDW: 16.7 % — ABNORMAL HIGH (ref 11.5–15.5)
WBC: 18.6 10*3/uL — AB (ref 4.0–10.5)

## 2014-05-27 LAB — CK: Total CK: 48 U/L (ref 7–232)

## 2014-05-27 LAB — PROTIME-INR
INR: 1.81 — ABNORMAL HIGH (ref 0.00–1.49)
PROTHROMBIN TIME: 21 s — AB (ref 11.6–15.2)

## 2014-05-27 LAB — URINE MICROSCOPIC-ADD ON

## 2014-05-27 LAB — TROPONIN I: Troponin I: <0.3 ng/mL (ref <0.30)

## 2014-05-27 MED ORDER — HYDROMORPHONE HCL PF 1 MG/ML IJ SOLN
0.5000 mg | Freq: Once | INTRAMUSCULAR | Status: AC
  Administered 2014-05-27: 0.5 mg via INTRAVENOUS
  Filled 2014-05-27: qty 1

## 2014-05-27 MED ORDER — PIPERACILLIN-TAZOBACTAM 3.375 G IVPB 30 MIN
3.3750 g | Freq: Once | INTRAVENOUS | Status: AC
  Administered 2014-05-28: 3.375 g via INTRAVENOUS
  Filled 2014-05-27: qty 50

## 2014-05-27 MED ORDER — VANCOMYCIN HCL IN DEXTROSE 1-5 GM/200ML-% IV SOLN
1000.0000 mg | Freq: Two times a day (BID) | INTRAVENOUS | Status: DC
  Administered 2014-05-28 – 2014-05-29 (×3): 1000 mg via INTRAVENOUS
  Filled 2014-05-27 (×5): qty 200

## 2014-05-27 MED ORDER — SODIUM CHLORIDE 0.9 % IV BOLUS (SEPSIS)
500.0000 mL | Freq: Once | INTRAVENOUS | Status: AC
  Administered 2014-05-27: 500 mL via INTRAVENOUS

## 2014-05-27 MED ORDER — SENNOSIDES-DOCUSATE SODIUM 8.6-50 MG PO TABS: 1.0000 | ORAL_TABLET | Freq: Two times a day (BID) | ORAL | Status: DC | PRN

## 2014-05-27 MED ORDER — DULOXETINE HCL 60 MG PO CPEP
60.0000 mg | ORAL_CAPSULE | Freq: Two times a day (BID) | ORAL | Status: DC
  Administered 2014-05-27 – 2014-05-31 (×8): 60 mg via ORAL
  Filled 2014-05-27 (×9): qty 1

## 2014-05-27 MED ORDER — OXYCODONE HCL ER 10 MG PO T12A
20.0000 mg | EXTENDED_RELEASE_TABLET | Freq: Two times a day (BID) | ORAL | Status: DC
  Administered 2014-05-28 – 2014-05-31 (×8): 20 mg via ORAL
  Filled 2014-05-27 (×9): qty 2

## 2014-05-27 MED ORDER — ONDANSETRON HCL 4 MG/2ML IJ SOLN
4.0000 mg | Freq: Four times a day (QID) | INTRAMUSCULAR | Status: DC | PRN
Start: 2014-05-27 — End: 2014-05-31
  Administered 2014-05-30: 4 mg via INTRAVENOUS
  Filled 2014-05-27: qty 2

## 2014-05-27 MED ORDER — VANCOMYCIN HCL IN DEXTROSE 1-5 GM/200ML-% IV SOLN
1000.0000 mg | Freq: Once | INTRAVENOUS | Status: AC
  Administered 2014-05-27: 1000 mg via INTRAVENOUS
  Filled 2014-05-27: qty 200

## 2014-05-27 MED ORDER — SODIUM CHLORIDE 0.9 % IV SOLN
INTRAVENOUS | Status: DC
  Administered 2014-05-27 – 2014-05-28 (×2): via INTRAVENOUS

## 2014-05-27 MED ORDER — PANTOPRAZOLE SODIUM 40 MG PO TBEC
40.0000 mg | DELAYED_RELEASE_TABLET | Freq: Every day | ORAL | Status: DC
  Administered 2014-05-28 – 2014-05-31 (×5): 40 mg via ORAL
  Filled 2014-05-27 (×6): qty 1

## 2014-05-27 MED ORDER — PIPERACILLIN-TAZOBACTAM 3.375 G IVPB
3.3750 g | Freq: Three times a day (TID) | INTRAVENOUS | Status: DC
  Administered 2014-05-28 – 2014-05-31 (×10): 3.375 g via INTRAVENOUS
  Filled 2014-05-27 (×12): qty 50

## 2014-05-27 MED ORDER — CEFTRIAXONE SODIUM 1 G IJ SOLR
1.0000 g | Freq: Once | INTRAMUSCULAR | Status: AC
  Administered 2014-05-27: 1 g via INTRAVENOUS

## 2014-05-27 MED ORDER — PNEUMOCOCCAL VAC POLYVALENT 25 MCG/0.5ML IJ INJ
0.5000 mL | INJECTION | INTRAMUSCULAR | Status: AC
  Administered 2014-05-28: 0.5 mL via INTRAMUSCULAR
  Filled 2014-05-27: qty 0.5

## 2014-05-27 MED ORDER — POLYETHYLENE GLYCOL 3350 17 G PO PACK
17.0000 g | PACK | Freq: Every day | ORAL | Status: DC
  Administered 2014-05-28 – 2014-05-30 (×3): 17 g via ORAL
  Filled 2014-05-27 (×5): qty 1

## 2014-05-27 MED ORDER — ALPRAZOLAM 0.5 MG PO TABS: 1.0000 mg | ORAL_TABLET | Freq: Two times a day (BID) | ORAL | Status: DC | PRN

## 2014-05-27 MED ORDER — CEFTRIAXONE SODIUM 1 G IJ SOLR
INTRAMUSCULAR | Status: AC
  Filled 2014-05-27: qty 10

## 2014-05-27 MED ORDER — LEVOTHYROXINE SODIUM 50 MCG PO TABS
50.0000 ug | ORAL_TABLET | Freq: Every day | ORAL | Status: DC
Start: 2014-05-28 — End: 2014-05-31
  Administered 2014-05-28 – 2014-05-31 (×4): 50 ug via ORAL
  Filled 2014-05-27 (×5): qty 1

## 2014-05-27 MED ORDER — WARFARIN - PHARMACIST DOSING INPATIENT: Freq: Every day | Status: DC

## 2014-05-27 MED ORDER — ONDANSETRON HCL 4 MG PO TABS: 4.0000 mg | ORAL_TABLET | Freq: Four times a day (QID) | ORAL | Status: DC | PRN

## 2014-05-27 MED ORDER — HYDROMORPHONE HCL PF 1 MG/ML IJ SOLN
0.5000 mg | INTRAMUSCULAR | Status: DC | PRN
  Administered 2014-05-27: 0.5 mg via INTRAVENOUS
  Filled 2014-05-27: qty 1

## 2014-05-27 MED ORDER — WARFARIN SODIUM 7.5 MG PO TABS
7.5000 mg | ORAL_TABLET | Freq: Once | ORAL | Status: AC
  Administered 2014-05-27: 7.5 mg via ORAL
  Filled 2014-05-27: qty 1

## 2014-05-27 MED ORDER — SODIUM CHLORIDE 0.9 % IJ SOLN
3.0000 mL | Freq: Two times a day (BID) | INTRAMUSCULAR | Status: DC
  Administered 2014-05-27 – 2014-05-29 (×4): 3 mL via INTRAVENOUS

## 2014-05-27 MED ORDER — TAMSULOSIN HCL 0.4 MG PO CAPS
0.4000 mg | ORAL_CAPSULE | Freq: Every day | ORAL | Status: DC
  Administered 2014-05-27 – 2014-05-30 (×4): 0.4 mg via ORAL
  Filled 2014-05-27 (×5): qty 1

## 2014-05-27 MED ORDER — OXYCODONE HCL 5 MG PO TABS: 30.0000 mg | ORAL_TABLET | ORAL | Status: DC | PRN

## 2014-05-27 MED ORDER — GABAPENTIN 400 MG PO CAPS
400.0000 mg | ORAL_CAPSULE | Freq: Three times a day (TID) | ORAL | Status: DC
  Administered 2014-05-27 – 2014-05-31 (×10): 400 mg via ORAL
  Filled 2014-05-27 (×13): qty 1

## 2014-05-27 MED ORDER — OXYCODONE HCL 10 MG PO TB12: 20.0000 mg | ORAL_TABLET | Freq: Two times a day (BID) | ORAL | Status: DC

## 2014-05-27 NOTE — ED Notes (Signed)
Pt's spouse is bringing him something to eat. Ok'ed per Dr. Aline Brochure.

## 2014-05-27 NOTE — ED Notes (Signed)
IV access/blood draw attempted x2 by this Rn and once by Garen Grams, RRT without success.

## 2014-05-27 NOTE — Progress Notes (Signed)
ANTICOAGULATION CONSULT NOTE - Initial Consult  Pharmacy Consult for coumadin Indication: DVT  Allergies  Allergen Reactions  . Morphine And Related Itching and Rash    Patient Measurements: Height: 5\' 7"  (170.2 cm) Weight: 156 lb 6.4 oz (70.943 kg) IBW/kg (Calculated) : 66.1   Vital Signs: Temp: 97.7 F (36.5 C) (08/27 2049) Temp src: Oral (08/27 2049) BP: 124/84 mmHg (08/27 2049) Pulse Rate: 98 (08/27 2049)  Labs:  Recent Labs  05/27/14 1621 05/27/14 1700  HGB 13.0  --   HCT 39.3  --   PLT 329  --   LABPROT  --  21.0*  INR  --  1.81*  CREATININE 0.90  --   TROPONINI  --  <0.30    Estimated Creatinine Clearance: 88.7 ml/min (by C-G formula based on Cr of 0.9).   Medical History: Past Medical History  Diagnosis Date  . Deep vein thrombosis   . Pulmonary emboli   . COPD (chronic obstructive pulmonary disease)   . Partial small bowel obstruction   . Myocardial infarction 2007; ~ 2011  . Stroke ~ 03/2012    "can't use my left hand"  . Chronic liver failure   . Liver cancer   . Metastatic lung cancer   . Shortness of breath     "all the time"  . Oxygen dependent     "I use it at home all the time"  . History of blood transfusion   . Hepatitis C   . Migraines     "used to have them all the time"  . Arthritis     "I'm eat up w/it"  . Depression     Medications:  Prescriptions prior to admission  Medication Sig Dispense Refill  . ALPRAZolam (XANAX) 1 MG tablet Take 1 mg by mouth 2 (two) times daily as needed for sleep.      . cyanocobalamin (,VITAMIN B-12,) 1000 MCG/ML injection Inject 1,000 mcg into the muscle once a week. On monday      . DULoxetine (CYMBALTA) 60 MG capsule Take 60 mg by mouth 2 (two) times daily.      . folic acid (FOLVITE) 1 MG tablet Take 1 mg by mouth daily.      Marland Kitchen gabapentin (NEURONTIN) 400 MG capsule Take 400 mg by mouth 3 (three) times daily.      Marland Kitchen levothyroxine (SYNTHROID, LEVOTHROID) 50 MCG tablet Take 50 mcg by mouth  daily before breakfast.      . Multiple Vitamins-Minerals (MULTIVITAMIN WITH MINERALS) tablet Take 1 tablet by mouth daily.      Marland Kitchen omeprazole (PRILOSEC) 20 MG capsule Take 20 mg by mouth 2 (two) times daily.      Marland Kitchen oxyCODONE (OXYCONTIN) 20 MG 12 hr tablet Take 1 tablet (20 mg total) by mouth every 12 (twelve) hours.  30 tablet  0  . oxycodone (ROXICODONE) 30 MG immediate release tablet Take 1 tablet every 4 hours as needed for pain. Contact your primary care physician on Monday, April 6 to arrange for your regular refill.  6 tablet  0  . potassium chloride SA (K-DUR,KLOR-CON) 20 MEQ tablet Take 20 mEq by mouth at bedtime.       . Tamsulosin HCl (FLOMAX) 0.4 MG CAPS Take 0.4 mg by mouth at bedtime.       Marland Kitchen warfarin (COUMADIN) 5 MG tablet Take 7.5 mg by mouth daily.      . [DISCONTINUED] ciprofloxacin (CIPRO) 500 MG tablet Take 1 tablet (500 mg total) by mouth  2 (two) times daily.  20 tablet  0  . [DISCONTINUED] OLANZapine (ZYPREXA) 5 MG tablet Take 5-10 mg by mouth at bedtime.      . [DISCONTINUED] oxycodone (ROXICODONE) 30 MG immediate release tablet Take 30 mg by mouth every 4 (four) hours as needed for pain.      . [DISCONTINUED] pregabalin (LYRICA) 75 MG capsule Take 75 mg by mouth daily.        Assessment: 53 yo man to continue coumadin for treatment of VTE.  His home dose was 7.5 mg daily and admission INR 1.81.   Goal of Therapy:  INR 2-3 Monitor platelets by anticoagulation protocol: Yes   Plan:  Coumadin 7.5 mg tonight Daily PT/INR  Brandt Chaney Poteet 05/27/2014,9:55 PM

## 2014-05-27 NOTE — Progress Notes (Signed)
ANTIBIOTIC CONSULT NOTE - INITIAL  Pharmacy Consult for Vancocin and Zosyn Indication: cellulitis  Allergies  Allergen Reactions  . Morphine And Related Itching and Rash    Patient Measurements: Height: 5\' 7"  (170.2 cm) Weight: 156 lb 6.4 oz (70.943 kg) IBW/kg (Calculated) : 66.1  Vital Signs: Temp: 97.7 F (36.5 C) (08/27 2049) Temp src: Oral (08/27 2049) BP: 124/84 mmHg (08/27 2049) Pulse Rate: 98 (08/27 2049)  Labs:  Recent Labs  05/27/14 1621  WBC 18.6*  HGB 13.0  PLT 329  CREATININE 0.90   Estimated Creatinine Clearance: 88.7 ml/min (by C-G formula based on Cr of 0.9).   Medical History: Past Medical History  Diagnosis Date  . Deep vein thrombosis     "several"  . Pulmonary emboli     "several"  . COPD (chronic obstructive pulmonary disease)   . Partial small bowel obstruction   . Myocardial infarction 2007; ~ 2011  . Chronic liver failure   . Shortness of breath     "all the time"  . Oxygen dependent     3L; 24/7" (05/27/2014)  . History of blood transfusion 2013    "related to kidneys shutting down"  . Hepatitis C   . Depression   . Hypertension   . Hypothyroidism   . Asthma   . Pneumonia     "several times"  . GERD (gastroesophageal reflux disease)   . Migraines     "2-3/day" (05/27/2014)  . Stroke ~ 03/2012    "couldn't use my left hand for ~ 6 months" (05/27/2014)  . Stroke 05/25/2014    "not able to use my left hand again" (05/27/2014)  . Arthritis     "I'm eat up w/it"  . Chronic lower back pain   . Anxiety   . Intermittent self-catheterization of bladder   . Liver cancer   . Metastatic lung cancer     "left"  . B12 deficiency     "give myself shots"    Medications:  Prescriptions prior to admission  Medication Sig Dispense Refill  . ALPRAZolam (XANAX) 1 MG tablet Take 1 mg by mouth 2 (two) times daily as needed for sleep.      . cyanocobalamin (,VITAMIN B-12,) 1000 MCG/ML injection Inject 1,000 mcg into the muscle once a week. On  monday      . DULoxetine (CYMBALTA) 60 MG capsule Take 60 mg by mouth 2 (two) times daily.      . folic acid (FOLVITE) 1 MG tablet Take 1 mg by mouth daily.      Marland Kitchen gabapentin (NEURONTIN) 400 MG capsule Take 400 mg by mouth 3 (three) times daily.      Marland Kitchen levothyroxine (SYNTHROID, LEVOTHROID) 50 MCG tablet Take 50 mcg by mouth daily before breakfast.      . Multiple Vitamins-Minerals (MULTIVITAMIN WITH MINERALS) tablet Take 1 tablet by mouth daily.      Marland Kitchen omeprazole (PRILOSEC) 20 MG capsule Take 20 mg by mouth 2 (two) times daily.      Marland Kitchen oxyCODONE (OXYCONTIN) 20 MG 12 hr tablet Take 1 tablet (20 mg total) by mouth every 12 (twelve) hours.  30 tablet  0  . oxycodone (ROXICODONE) 30 MG immediate release tablet Take 1 tablet every 4 hours as needed for pain. Contact your primary care physician on Monday, April 6 to arrange for your regular refill.  6 tablet  0  . potassium chloride SA (K-DUR,KLOR-CON) 20 MEQ tablet Take 20 mEq by mouth at bedtime.       Marland Kitchen  Tamsulosin HCl (FLOMAX) 0.4 MG CAPS Take 0.4 mg by mouth at bedtime.       Marland Kitchen warfarin (COUMADIN) 5 MG tablet Take 7.5 mg by mouth daily.      . [DISCONTINUED] ciprofloxacin (CIPRO) 500 MG tablet Take 1 tablet (500 mg total) by mouth 2 (two) times daily.  20 tablet  0  . [DISCONTINUED] OLANZapine (ZYPREXA) 5 MG tablet Take 5-10 mg by mouth at bedtime.      . [DISCONTINUED] oxycodone (ROXICODONE) 30 MG immediate release tablet Take 30 mg by mouth every 4 (four) hours as needed for pain.      . [DISCONTINUED] pregabalin (LYRICA) 75 MG capsule Take 75 mg by mouth daily.       Scheduled:  . cefTRIAXone      . DULoxetine  60 mg Oral BID  . gabapentin  400 mg Oral TID  . [START ON 05/28/2014] levothyroxine  50 mcg Oral QAC breakfast  . OxyCODONE  20 mg Oral Q12H  . pantoprazole  40 mg Oral Daily  . [START ON 05/28/2014] pneumococcal 23 valent vaccine  0.5 mL Intramuscular Tomorrow-1000  . polyethylene glycol  17 g Oral Daily  . sodium chloride  3 mL  Intravenous Q12H  . tamsulosin  0.4 mg Oral QHS  . warfarin  7.5 mg Oral Once  . [START ON 05/28/2014] Warfarin - Pharmacist Dosing Inpatient   Does not apply q1800   Infusions:  . sodium chloride 100 mL/hr at 05/27/14 2245    Assessment: 53yo male c/o pain/redness/swelling to left lower arm x3d, Xray c/w diffuse soft-tissue swelling without bone involvement, to begin IV ABX for cellulitis w/ plan for possible surgical intervention.  Goal of Therapy:  Vancomycin trough level 10-15 mcg/ml  Plan:  Rec'd vanc 1g and Rocephin 1g in ED; will continue with vancomycin 1g IV Q12H and Zosyn 3.375g IV Q8H and monitor CBC, Cx, levels prn.  Wynona Neat, PharmD, BCPS  05/27/2014,11:13 PM

## 2014-05-27 NOTE — ED Notes (Signed)
Pain redness and swelling to his left lower arm x 3 days.

## 2014-05-27 NOTE — H&P (Signed)
[facility] History and Physical  Patient: Robert [name]  XIH:[id number]  DOB: [birth year]  DOS: the patient was seen and examined on 05/27/2014 PCP: No PCP Per Patient  Chief Complaint: Left hand swelling and redness  HPI: Robert [name] is a 53 y.o. 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 The patient presented with complaints of left hand pain and swelling. He mentions that he was working with his truck and after that started having pain on his left wrist. Later on as the day progressed he started having redness and swelling of the hand. Since last 2 days he has been having severe pain and swelling at the left wrist and unable to move the wrist. He feels as if the left hand has stopped responding. He has multiple scratch marks and mentions he had a fall 2 weeks ago and has sustained scratches from that. He denies being an IV drug abuser. He is an active smoker denies daily alcohol use mentions drinks 2-3 beers 2-3 times a week. In the after at an unknown time the patient's wife felt that the patient was having difficulty speaking clearly. Also 2 days ago wife noted that the patient was having trouble controlling the car and up on arrival at home he had some chest pain and passed out. Patient denies any chest the time of my evaluation. He complains of some tingling and increased sensation on the left side.  The patient is coming from home. And at his baseline independent for most of his ADL.  Review of Systems: as mentioned in the history of present illness.  A Comprehensive review of the other systems is negative.  Past Medical History  Diagnosis Date  . Deep vein thrombosis   . Pulmonary emboli   . COPD (chronic obstructive pulmonary disease)   . Partial small bowel obstruction   . Myocardial  infarction 2007; ~ 2011  . Stroke ~ 03/2012    "can't use my left hand"  . Chronic liver failure   . Liver cancer   . Metastatic lung cancer   . Shortness of breath     "all the time"  . Oxygen dependent     "I use it at home all the time"  . History of blood transfusion   . Hepatitis C   . Migraines     "used to have them all the time"  . Arthritis     "I'm eat up w/it"  . Depression    Past Surgical History  Procedure Laterality Date  . Prostate surgery  2012  . Tonsillectomy and adenoidectomy  1974   Social History:  reports that he has been smoking Cigarettes.  He has a 17 pack-year smoking history. He has never used smokeless tobacco. He reports that he drinks about 7.8 ounces of alcohol per week. He reports that he uses illicit drugs (Cocaine).  Allergies  Allergen Reactions  . Morphine And Related Itching and Rash    Family History  Problem Relation Age of Onset  . Cancer Mother   . Cancer Paternal Aunt   . Cancer Maternal Aunt     Prior to Admission medications   Medication Sig Start Date End Date Taking? Authorizing Provider  ALPRAZolam Duanne Moron) 1 MG tablet Take 1 mg by mouth 2 (two) times daily as needed for sleep.    Historical Provider, MD  cyanocobalamin (,VITAMIN B-12,) 1000 MCG/ML injection Inject 1,000 mcg into the muscle once a week. On monday    Historical Provider, MD  DULoxetine (CYMBALTA) 60 MG capsule Take 60 mg by mouth 2 (two) times daily.    Historical Provider, MD  folic acid (FOLVITE) 1 MG tablet Take 1 mg by mouth daily.    Historical Provider, MD  gabapentin (NEURONTIN) 400 MG capsule Take 400 mg by mouth 3 (three) times daily.    Historical Provider, MD  levothyroxine (SYNTHROID, LEVOTHROID) 50 MCG tablet Take 50 mcg by mouth daily before breakfast.    Historical Provider, MD  Multiple Vitamins-Minerals (MULTIVITAMIN WITH MINERALS) tablet Take 1 tablet by mouth daily.    Historical Provider, MD  omeprazole (PRILOSEC) 20 MG capsule Take 20 mg  by mouth 2 (two) times daily.    Historical Provider, MD  oxyCODONE (OXYCONTIN) 20 MG 12 hr tablet Take 1 tablet (20 mg total) by mouth every 12 (twelve) hours. 04/20/12   Domenic Polite, MD  oxycodone (ROXICODONE) 30 MG immediate release tablet Take 1 tablet every 4 hours as needed for pain. Contact your primary care physician on Monday, April 6 to arrange for your regular refill. 01/03/14   John L Molpus, MD  potassium chloride SA (K-DUR,KLOR-CON) 20 MEQ tablet Take 20 mEq by mouth at bedtime.     Historical Provider, MD  Tamsulosin HCl (FLOMAX) 0.4 MG CAPS Take 0.4 mg by mouth at bedtime.     Historical Provider, MD  warfarin (COUMADIN) 5 MG tablet Take 7.5 mg by mouth daily.    Historical Provider, MD    Physical Exam: Filed Vitals:   05/27/14 1534 05/27/14 1941 05/27/14 2049  BP: 107/75 125/85 124/84  Pulse: 103 92 98  Temp: 98 F (36.7 C) 98.2 F (36.8 C) 97.7 F (36.5 C)  TempSrc: Oral Oral Oral  Resp: _0 Height: _1  (1.702 m)  _2  (1.702 m)  Weight: 66.679 kg (147 lb)  70.943 kg (156 lb 6.4 oz)  SpO2: 100% 95% 100%    General: Alert, Awake and Oriented to Time, Place and Person. Appear in mild distress Eyes: PERRL ENT: Oral Mucosa clear dry . Neck: No  JVD Cardiovascular: S1 and S2 Present, no  Murmur, Peripheral Pulses Present Respiratory: Bilateral Air entry equal and Decreased, Clear to Auscultation, no Crackles, no  wheezes Abdomen: Bowel Sound Present, Soft and  mild diffuse tender Skin: Multiple scratch marks noted on the hand veins.  Extremities: No  Pedal edema, no  calf tenderness Left hand redness, induration, swelling, tenderness at severely decreased range of motion of the wrist. Patient is unable to move his fingers. Swelling and was fingers wrist forearm and elbow. Redness and also same territory. Shoulder has no limitation of range of motion.  Neurologic: Grossly no focal neuro deficit other than both upper and lower extremity left-sided  paresthesia  Labs on Admission:  CBC:  Recent Labs Lab 05/27/14 1621  WBC 18.6*  NEUTROABS 15.6*  HGB 13.0  HCT 39.3  MCV 94.2  PLT 329    CMP     Component Value Date/Time   NA 137 05/27/2014 1621   K 4.7 05/27/2014 1621   CL 98 05/27/2014 1621   CO2 23 05/27/2014 1621   GLUCOSE 93 05/27/2014 1621   BUN 7 05/27/2014 1621   CREATININE 0.90 05/27/2014 1621   CALCIUM 9.3 05/27/2014 1621   PROT 6.9 05/27/2014 1621   ALBUMIN 2.8* 05/27/2014 1621   AST 43*  05/27/2014 1621   ALT 31 05/27/2014 1621   ALKPHOS 66 05/27/2014 1621   BILITOT <0.2* 05/27/2014 1621   GFRNONAA >90 05/27/2014 1621   GFRAA >90 05/27/2014 1621    No results found for this basename: LIPASE, AMYLASE,  in the last 168 hours No results found for this basename: AMMONIA,  in the last 168 hours   Recent Labs Lab 05/27/14 1700  TROPONINI <0.30   BNP (last 3 results) No results found for this basename: PROBNP,  in the last 8760 hours  Radiological Exams on Admission: Dg Chest 2 View  05/27/2014   CLINICAL DATA:  Difficulty breathing and pain  EXAM: CHEST  2 VIEW  COMPARISON:  Chest radiograph December 08, 2010 and chest CT May 30, 2012  FINDINGS: There is patchy bibasilar atelectatic change. Elsewhere lungs are clear. Heart size and pulmonary vascularity are normal. No adenopathy. No pneumothorax. No appreciable bone lesions.  IMPRESSION: Patchy bibasilar atelectatic change. Elsewhere lungs are clear. No demonstrable pneumothorax.   Electronically Signed   By: Lowella Grip M.D.   On: 05/27/2014 17:39   Ct Head Wo Contrast  05/27/2014   CLINICAL DATA:  Slurred speech  EXAM: CT HEAD WITHOUT CONTRAST  TECHNIQUE: Contiguous axial images were obtained from the base of the skull through the vertex without intravenous contrast.  COMPARISON:  None.  FINDINGS: No acute intracranial hemorrhage. No focal mass lesion. No CT evidence of acute infarction. No midline shift or mass effect. No hydrocephalus. Basilar cisterns are  patent. Minimal periventricular white matter hypodensity.  Mastoid air cells are clear. There is frothy material within the maxillary sinuses. Frontal sinuses are clear.  IMPRESSION: 1. No acute intracranial findings. 2. Maxillary sinusitis.   Electronically Signed   By: Suzy Bouchard M.D.   On: 05/27/2014 17:43   Dg Hand Complete Left  05/27/2014   CLINICAL DATA:  Pain and redness.  Swelling.  EXAM: LEFT HAND - COMPLETE 3+ VIEW  COMPARISON:  None.  FINDINGS: Mild first MCP joint osteoarthritis. Anatomic alignment. No fracture. Diffuse swelling of the hand. No gas in the soft tissues. No erosive changes of bone. Benign osteochondroma projects off the radial and volar aspect of the first metacarpal.  IMPRESSION: Diffuse soft tissue swelling of the hand. No acute osseous abnormality.   Electronically Signed   By: Dereck Ligas M.D.   On: 05/27/2014 17:37    EKG: Independently reviewed. normal sinus rhythm, nonspecific ST and T waves changes. Assessment/Plan Principal Problem:   Cellulitis of left hand Active Problems:   Lung cancer   Pulmonary emboli   Low back pain   Chronically on opiate therapy   Chronic anticoagulation   1. Cellulitis of left hand The patient is complaining of left hand redness and swelling. He appears to have severe synovitis of the left hand with limitation of range of motion due to severe pain. I can feel the pulse increased urine is not showing any evidence of hematuria. Sensations are increased on the left side. But he has tightness of his wrist and limitation of range of motion of his fingers and wrist with generalized weakness there. With this I discussed the case with hand surgeon who will come to evaluate the patient. Unfortunately patient has had a meal 2 and half hours ago. Currently daily keep him n.p.o. His INR is 1.8. I would hold his warfarin. I will give him IV fluids and treat him with IV vancomycin and Zosyn. Follow CPK ESR CRP.  2. chest  pain. Troponin and EKG are negative continue to follow serial troponin.  3.Left-sided paresthesia and slurred speech. CT head negative last known well to time unknown. Patient is on warfarin at home. Neurology consulted for further input.  4.Chronic abdominal pain Chronic opiate therapy Continue home medication  5. 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 Has metastatic lung cancer therefore he is on chronic anticoagulation. Currently I would hold his warfarin for possible need for surgery. Will consult pharmacy for continuation of his anti-coagulation   Consults: Had surgery, neurology  DVT Prophylaxis: on chronic anticoagulation Nutrition: npo  Code Status: full  Family Communication: wife was present at bedside, opportunity was given to ask question and all questions were answered satisfactorily at the time of interview. Disposition: Admitted to inpatient in telemetry unit.  Author: Berle Mull, MD [facility] Pager: [phone] 05/27/2014, 9:57 PM    If 7PM-7AM, please contact night-coverage [email or web address] Password [email or web address]  **Disclaimer: This note may have been dictated with voice recognition software. Similar sounding words can inadvertently be transcribed and this note may contain transcription errors which may not have been corrected upon publication of note.**

## 2014-05-27 NOTE — ED Provider Notes (Signed)
CSN: [id number]     Arrival date & time 05/27/14  1529 History   First MD Initiated Contact with Patient 05/27/14 1608     Chief Complaint  Patient presents with  . Cellulitis     (Consider location/radiation/quality/duration/timing/severity/associated sxs/prior Treatment) Patient is a 53 y.o. male presenting with hand pain. The history is provided by the patient.  Hand Pain This is a new problem. Episode onset: 2 days ago. The problem occurs constantly. The problem has been gradually worsening. Associated symptoms include chest pain (2 days ago). Pertinent negatives include no abdominal pain, no headaches and no shortness of breath. Exacerbated by: movement. Nothing relieves the symptoms. He has tried nothing for the symptoms. The treatment provided no relief.    Past Medical History  Diagnosis Date  . Deep vein thrombosis   . Pulmonary emboli   . COPD (chronic obstructive pulmonary disease)   . Partial small bowel obstruction   . Myocardial infarction 2007; ~ 2011  . Stroke ~ 03/2012    "can't use my left hand"  . Chronic liver failure   . Liver cancer   . Metastatic lung cancer   . Shortness of breath     "all the time"  . Oxygen dependent     "I use it at home all the time"  . History of blood transfusion   . Hepatitis C   . Migraines     "used to have them all the time"  . Arthritis     "I'm eat up w/it"  . Depression    Past Surgical History  Procedure Laterality Date  . Prostate surgery  2012  . Tonsillectomy and adenoidectomy  1974   No family history on file. History  Substance Use Topics  . Smoking status: Current Every Day Smoker -- 0.50 packs/day for 34 years    Types: Cigarettes  . Smokeless tobacco: Never Used  . Alcohol Use: 2.4 oz/week    4 Glasses of wine per week     Comment: 04/18/12 "drink 3-4 wine coolers/wk"    Review of Systems  Constitutional: Negative for fever.  HENT: Negative for drooling and rhinorrhea.   Eyes: Negative for pain.   Respiratory: Negative for cough and shortness of breath.   Cardiovascular: Positive for chest pain (2 days ago). Negative for leg swelling.       Syncope   Gastrointestinal: Negative for nausea, vomiting, abdominal pain and diarrhea.  Genitourinary: Negative for dysuria and hematuria.  Musculoskeletal: Negative for gait problem and neck pain.  Skin: Negative for color change.  Neurological: Negative for numbness and headaches.  Hematological: Negative for adenopathy.  Psychiatric/Behavioral: Negative for behavioral problems.  All other systems reviewed and are negative.     Allergies  Morphine and related  Home Medications   Prior to Admission medications   Medication Sig Start Date End Date Taking? Authorizing Provider  ALPRAZolam Duanne Moron) 1 MG tablet Take 1 mg by mouth 2 (two) times daily as needed for sleep.    Historical Provider, MD  ciprofloxacin (CIPRO) 500 MG tablet Take 1 tablet (500 mg total) by mouth 2 (two) times daily. 05/15/13   Lucious Groves, DO  cyanocobalamin (,VITAMIN B-12,) 1000 MCG/ML injection Inject 1,000 mcg into the muscle once a week. On monday    Historical Provider, MD  DULoxetine (CYMBALTA) 60 MG capsule Take 60 mg by mouth 2 (two) times daily.    Historical Provider, MD  folic acid (FOLVITE) 1 MG tablet Take 1 mg by mouth  daily.    Historical Provider, MD  gabapentin (NEURONTIN) 400 MG capsule Take 400 mg by mouth 3 (three) times daily.    Historical Provider, MD  levothyroxine (SYNTHROID, LEVOTHROID) 50 MCG tablet Take 50 mcg by mouth daily before breakfast.    Historical Provider, MD  Multiple Vitamins-Minerals (MULTIVITAMIN WITH MINERALS) tablet Take 1 tablet by mouth daily.    Historical Provider, MD  OLANZapine (ZYPREXA) 5 MG tablet Take 5-10 mg by mouth at bedtime.    Historical Provider, MD  omeprazole (PRILOSEC) 20 MG capsule Take 20 mg by mouth 2 (two) times daily.    Historical Provider, MD  oxyCODONE (OXYCONTIN) 20 MG 12 hr tablet Take 1  tablet (20 mg total) by mouth every 12 (twelve) hours. 04/20/12   Domenic Polite, MD  oxycodone (ROXICODONE) 30 MG immediate release tablet Take 30 mg by mouth every 4 (four) hours as needed for pain.    Historical Provider, MD  oxycodone (ROXICODONE) 30 MG immediate release tablet Take 1 tablet every 4 hours as needed for pain. Contact your primary care physician on Monday, April 6 to arrange for your regular refill. 01/03/14   John L Molpus, MD  potassium chloride SA (K-DUR,KLOR-CON) 20 MEQ tablet Take 20 mEq by mouth at bedtime.     Historical Provider, MD  pregabalin (LYRICA) 75 MG capsule Take 75 mg by mouth daily.    Historical Provider, MD  Tamsulosin HCl (FLOMAX) 0.4 MG CAPS Take 0.4 mg by mouth at bedtime.     Historical Provider, MD  warfarin (COUMADIN) 5 MG tablet Take 7.5 mg by mouth daily.    Historical Provider, MD   BP 107/75  Pulse 103  Temp(Src) 98 F (36.7 C) (Oral)  Resp 14  Ht 5\' 7"  (1.702 m)  Wt 147 lb (66.679 kg)  BMI 23.02 kg/m2  SpO2 100% Physical Exam  Nursing note and vitals reviewed. Constitutional: He is oriented to person, place, and time. He appears well-developed and well-nourished.  HENT:  Head: Normocephalic and atraumatic.  Right Ear: External ear normal.  Left Ear: External ear normal.  Nose: Nose normal.  Mouth/Throat: Oropharynx is clear and moist. No oropharyngeal exudate.  Eyes: Conjunctivae and EOM are normal. Pupils are equal, round, and reactive to light.  Neck: Normal range of motion. Neck supple.  Cardiovascular: Normal rate, regular rhythm, normal heart sounds and intact distal pulses.  Exam reveals no gallop and no friction rub.   No murmur heard. Pulmonary/Chest: Effort normal and breath sounds normal. No respiratory distress. He has no wheezes.  Abdominal: Soft. Bowel sounds are normal. He exhibits no distension. There is no tenderness. There is no rebound and no guarding.  Musculoskeletal: Normal range of motion. He exhibits edema and  tenderness.  Mild erythema and pitting edema to the dorsum of the left hand. The erythema extends mildly up the dorsal surface of the left forearm. Multiple mild excoriations are noted on the proximal dorsal surface of the left forearm. Patient is unable to move the digits of his left hand or move his left wrist due to pain and swelling.  2+ distal pulses in all extremities.  Neurological: He is alert and oriented to person, place, and time.  alert, oriented x3 speech: normal in context and clarity memory: intact grossly cranial nerves II-XII: intact motor strength: full proximally and distally w/ exception of left hand and wrist movement which is limited no involuntary movements or tremors sensation: intact to light touch diffusely  cerebellar: finger-to-nose and heel-to-shin intact  gait: deferred, antalgic gait at baseline, uses cane   Skin: Skin is warm and dry.  Psychiatric: He has a normal mood and affect. His behavior is normal.    ED Course  Procedures (including critical care time) Labs Review Labs Reviewed  SURGICAL PCR SCREEN - Abnormal; Notable for the following:    MRSA, PCR POSITIVE (*)    Staphylococcus aureus POSITIVE (*)    All other components within normal limits  CBC WITH DIFFERENTIAL - Abnormal; Notable for the following:    WBC 18.6 (*)    RBC 4.17 (*)    RDW 16.7 (*)    Neutrophils Relative % 84 (*)    Lymphocytes Relative 7 (*)    Neutro Abs 15.6 (*)    Monocytes Absolute 1.1 (*)    Basophils Absolute 0.2 (*)    All other components within normal limits  COMPREHENSIVE METABOLIC PANEL - Abnormal; Notable for the following:    Albumin 2.8 (*)    AST 43 (*)    Total Bilirubin <0.2 (*)    Anion gap 16 (*)    All other components within normal limits  PROTIME-INR - Abnormal; Notable for the following:    Prothrombin Time 21.0 (*)    INR 1.81 (*)    All other components within normal limits  URINALYSIS, ROUTINE W REFLEX MICROSCOPIC - Abnormal; Notable  for the following:    Nitrite POSITIVE (*)    Leukocytes, UA TRACE (*)    All other components within normal limits  URINE MICROSCOPIC-ADD ON - Abnormal; Notable for the following:    Bacteria, UA FEW (*)    All other components within normal limits  SEDIMENTATION RATE - Abnormal; Notable for the following:    Sed Rate 27 (*)    All other components within normal limits  URINE RAPID DRUG SCREEN (HOSP PERFORMED) - Abnormal; Notable for the following:    Cocaine POSITIVE (*)    Benzodiazepines POSITIVE (*)    Barbiturates POSITIVE (*)    All other components within normal limits  CBC WITH DIFFERENTIAL - Abnormal; Notable for the following:    WBC 16.1 (*)    RBC 3.91 (*)    Hemoglobin 12.0 (*)    HCT 36.7 (*)    RDW 16.3 (*)    Neutro Abs 12.3 (*)    Monocytes Absolute 1.3 (*)    All other components within normal limits  URINE CULTURE  ANAEROBIC CULTURE  CULTURE, ROUTINE-ABSCESS  TROPONIN I  CK  TROPONIN I  C-REACTIVE PROTEIN  PROTIME-INR  COMPREHENSIVE METABOLIC PANEL  PROTIME-INR    Imaging Review Dg Chest 2 View  05/27/2014   CLINICAL DATA:  Difficulty breathing and pain  EXAM: CHEST  2 VIEW  COMPARISON:  Chest radiograph December 08, 2010 and chest CT May 30, 2012  FINDINGS: There is patchy bibasilar atelectatic change. Elsewhere lungs are clear. Heart size and pulmonary vascularity are normal. No adenopathy. No pneumothorax. No appreciable bone lesions.  IMPRESSION: Patchy bibasilar atelectatic change. Elsewhere lungs are clear. No demonstrable pneumothorax.   Electronically Signed   By: Lowella Grip M.D.   On: 05/27/2014 17:39   Ct Head Wo Contrast  05/27/2014   CLINICAL DATA:  Slurred speech  EXAM: CT HEAD WITHOUT CONTRAST  TECHNIQUE: Contiguous axial images were obtained from the base of the skull through the vertex without intravenous contrast.  COMPARISON:  None.  FINDINGS: No acute intracranial hemorrhage. No focal mass lesion. No CT evidence of acute  infarction. No  midline shift or mass effect. No hydrocephalus. Basilar cisterns are patent. Minimal periventricular white matter hypodensity.  Mastoid air cells are clear. There is frothy material within the maxillary sinuses. Frontal sinuses are clear.  IMPRESSION: 1. No acute intracranial findings. 2. Maxillary sinusitis.   Electronically Signed   By: Suzy Bouchard M.D.   On: 05/27/2014 17:43   Dg Hand Complete Left  05/27/2014   CLINICAL DATA:  Pain and redness.  Swelling.  EXAM: LEFT HAND - COMPLETE 3+ VIEW  COMPARISON:  None.  FINDINGS: Mild first MCP joint osteoarthritis. Anatomic alignment. No fracture. Diffuse swelling of the hand. No gas in the soft tissues. No erosive changes of bone. Benign osteochondroma projects off the radial and volar aspect of the first metacarpal.  IMPRESSION: Diffuse soft tissue swelling of the hand. No acute osseous abnormality.   Electronically Signed   By: Dereck Ligas M.D.   On: 05/27/2014 17:37     EKG Interpretation   Date/Time:  Thursday May 27 2014 17:01:45 EDT Ventricular Rate:  96 PR Interval:  118 QRS Duration: 84 QT Interval:  364 QTC Calculation: 459 R Axis:   67 Text Interpretation:  Normal sinus rhythm Normal ECG No significant change  since last tracing Confirmed by Lux Meaders  MD, Liesl Simons ([id number]) on 05/27/2014  5:13:08 PM      MDM   Final diagnoses:  Cellulitis of left hand  Sepsis affecting skin  UTI (lower urinary tract infection)    4:36 PM 53 y.o. male w hx of CVA, DVT on coumadin, metastatic lung cancer (terminal per pt) who presents with redness and swelling of the dorsum of the left hand which began 2 days ago. His wife states that he had gone to the store 2 days ago and was having some trouble controlling the car on the way home and was swerving. Upon arrival home he was having some chest pain and apparently had a syncopal episode. He denies any chest pain today. He presents with complaint of left hand redness and  swelling. He denies any known injury but he did fall down some stairs several weeks ago. He denies any fevers. He is afebrile and mildly tachycardic here. Will get screening labs and imaging.  Pt meets SIRS criteria w/ suspected infection being the cellulitis. Given confusion several days ago, will also tx for UTI. Will admit to hospitalist. Pt remains stable for transport.   Pamella Pert, MD 05/28/14 [phone]

## 2014-05-28 ENCOUNTER — Encounter (HOSPITAL_COMMUNITY): Payer: Medicaid Other | Admitting: Anesthesiology

## 2014-05-28 ENCOUNTER — Inpatient Hospital Stay (HOSPITAL_COMMUNITY): Payer: Medicaid Other | Admitting: Anesthesiology

## 2014-05-28 ENCOUNTER — Encounter (HOSPITAL_COMMUNITY)
Admission: EM | Disposition: A | Discharge status: Home Health | Payer: Self-pay | Source: Home / Self Care | Attending: Internal Medicine

## 2014-05-28 ENCOUNTER — Inpatient Hospital Stay (HOSPITAL_COMMUNITY): Payer: Medicaid Other

## 2014-05-28 DIAGNOSIS — L03119 Cellulitis of unspecified part of limb: Secondary | ICD-10-CM

## 2014-05-28 DIAGNOSIS — R471 Dysarthria and anarthria: Secondary | ICD-10-CM

## 2014-05-28 DIAGNOSIS — L02519 Cutaneous abscess of unspecified hand: Secondary | ICD-10-CM

## 2014-05-28 DIAGNOSIS — R209 Unspecified disturbances of skin sensation: Secondary | ICD-10-CM

## 2014-05-28 HISTORY — PX: INCISION AND DRAINAGE ABSCESS: SHX5864

## 2014-05-28 LAB — SURGICAL PCR SCREEN
MRSA, PCR: POSITIVE — AB
STAPHYLOCOCCUS AUREUS: POSITIVE — AB

## 2014-05-28 LAB — RAPID URINE DRUG SCREEN, HOSP PERFORMED
Amphetamines: NOT DETECTED
Barbiturates: POSITIVE — AB
Benzodiazepines: POSITIVE — AB
COCAINE: POSITIVE — AB
Opiates: NOT DETECTED
TETRAHYDROCANNABINOL: NOT DETECTED

## 2014-05-28 LAB — COMPREHENSIVE METABOLIC PANEL
ALT: 26 U/L (ref 0–53)
AST: 29 U/L (ref 0–37)
Albumin: 2.6 g/dL — ABNORMAL LOW (ref 3.5–5.2)
Alkaline Phosphatase: 60 U/L (ref 39–117)
Anion gap: 12 (ref 5–15)
BUN: 8 mg/dL (ref 6–23)
CALCIUM: 8.3 mg/dL — AB (ref 8.4–10.5)
CO2: 23 mEq/L (ref 19–32)
CREATININE: 0.97 mg/dL (ref 0.50–1.35)
Chloride: 97 mEq/L (ref 96–112)
GLUCOSE: 92 mg/dL (ref 70–99)
Potassium: 4.4 mEq/L (ref 3.7–5.3)
SODIUM: 132 meq/L — AB (ref 137–147)
Total Bilirubin: 0.3 mg/dL (ref 0.3–1.2)
Total Protein: 6.1 g/dL (ref 6.0–8.3)

## 2014-05-28 LAB — CBC WITH DIFFERENTIAL/PLATELET
BASOS ABS: 0 10*3/uL (ref 0.0–0.1)
Basophils Relative: 0 % (ref 0–1)
EOS ABS: 0.1 10*3/uL (ref 0.0–0.7)
EOS PCT: 1 % (ref 0–5)
HEMATOCRIT: 36.7 % — AB (ref 39.0–52.0)
Hemoglobin: 12 g/dL — ABNORMAL LOW (ref 13.0–17.0)
Lymphocytes Relative: 15 % (ref 12–46)
Lymphs Abs: 2.5 10*3/uL (ref 0.7–4.0)
MCH: 30.7 pg (ref 26.0–34.0)
MCHC: 32.7 g/dL (ref 30.0–36.0)
MCV: 93.9 fL (ref 78.0–100.0)
MONO ABS: 1.3 10*3/uL — AB (ref 0.1–1.0)
Monocytes Relative: 8 % (ref 3–12)
Neutro Abs: 12.3 10*3/uL — ABNORMAL HIGH (ref 1.7–7.7)
Neutrophils Relative %: 76 % (ref 43–77)
PLATELETS: 325 10*3/uL (ref 150–400)
RBC: 3.91 MIL/uL — ABNORMAL LOW (ref 4.22–5.81)
RDW: 16.3 % — AB (ref 11.5–15.5)
WBC: 16.1 10*3/uL — ABNORMAL HIGH (ref 4.0–10.5)

## 2014-05-28 LAB — C-REACTIVE PROTEIN: CRP: 17 mg/dL — AB (ref <0.60)

## 2014-05-28 LAB — PROTIME-INR
INR: 1.53 — AB (ref 0.00–1.49)
Prothrombin Time: 18.4 seconds — ABNORMAL HIGH (ref 11.6–15.2)

## 2014-05-28 LAB — SEDIMENTATION RATE: Sed Rate: 27 mm/hr — ABNORMAL HIGH (ref 0–16)

## 2014-05-28 SURGERY — INCISION AND DRAINAGE, ABSCESS
Anesthesia: Monitor Anesthesia Care | Site: Hand | Laterality: Left

## 2014-05-28 MED ORDER — ADULT MULTIVITAMIN W/MINERALS CH
1.0000 | ORAL_TABLET | Freq: Every day | ORAL | Status: DC
  Administered 2014-05-28 – 2014-05-31 (×4): 1 via ORAL
  Filled 2014-05-28 (×4): qty 1

## 2014-05-28 MED ORDER — CHLORHEXIDINE GLUCONATE CLOTH 2 % EX PADS
6.0000 | MEDICATED_PAD | Freq: Every day | CUTANEOUS | Status: DC
  Administered 2014-05-28 – 2014-05-31 (×4): 6 via TOPICAL

## 2014-05-28 MED ORDER — LORAZEPAM 1 MG PO TABS: 1.0000 mg | ORAL_TABLET | Freq: Four times a day (QID) | ORAL | Status: AC | PRN

## 2014-05-28 MED ORDER — SODIUM CHLORIDE 0.9 % IV SOLN
INTRAVENOUS | Status: AC
  Administered 2014-05-29: 02:00:00 via INTRAVENOUS

## 2014-05-28 MED ORDER — FENTANYL CITRATE 0.05 MG/ML IJ SOLN
INTRAMUSCULAR | Status: DC | PRN
  Administered 2014-05-28 (×2): 50 ug via INTRAVENOUS

## 2014-05-28 MED ORDER — LORAZEPAM 2 MG/ML IJ SOLN
1.0000 mg | Freq: Four times a day (QID) | INTRAMUSCULAR | Status: AC | PRN
  Administered 2014-05-30: 1 mg via INTRAVENOUS
  Filled 2014-05-28: qty 1

## 2014-05-28 MED ORDER — OXYCODONE HCL 5 MG PO TABS
15.0000 mg | ORAL_TABLET | Freq: Four times a day (QID) | ORAL | Status: DC | PRN
  Administered 2014-05-28 – 2014-05-30 (×3): 15 mg via ORAL
  Filled 2014-05-28 (×3): qty 3

## 2014-05-28 MED ORDER — CETYLPYRIDINIUM CHLORIDE 0.05 % MT LIQD
7.0000 mL | Freq: Two times a day (BID) | OROMUCOSAL | Status: DC
  Administered 2014-05-28 – 2014-05-30 (×5): 7 mL via OROMUCOSAL

## 2014-05-28 MED ORDER — LORAZEPAM 2 MG/ML IJ SOLN: 0.0000 mg | Freq: Two times a day (BID) | INTRAMUSCULAR | Status: DC

## 2014-05-28 MED ORDER — PROMETHAZINE HCL 25 MG/ML IJ SOLN
6.2500 mg | INTRAMUSCULAR | Status: DC | PRN
Start: 2014-05-28 — End: 2014-05-28

## 2014-05-28 MED ORDER — LORAZEPAM 2 MG/ML IJ SOLN: 0.0000 mg | Freq: Four times a day (QID) | INTRAMUSCULAR | Status: AC

## 2014-05-28 MED ORDER — FENTANYL CITRATE 0.05 MG/ML IJ SOLN
INTRAMUSCULAR | Status: AC
  Filled 2014-05-28: qty 5

## 2014-05-28 MED ORDER — MUPIROCIN 2 % EX OINT
1.0000 "application " | TOPICAL_OINTMENT | Freq: Two times a day (BID) | CUTANEOUS | Status: DC
  Administered 2014-05-28 – 2014-05-30 (×6): 1 via NASAL
  Filled 2014-05-28: qty 22

## 2014-05-28 MED ORDER — THIAMINE HCL 100 MG/ML IJ SOLN
100.0000 mg | Freq: Every day | INTRAMUSCULAR | Status: DC
  Administered 2014-05-28: 100 mg via INTRAVENOUS
  Filled 2014-05-28 (×2): qty 1

## 2014-05-28 MED ORDER — ALPRAZOLAM 0.25 MG PO TABS: 0.2500 mg | ORAL_TABLET | Freq: Two times a day (BID) | ORAL | Status: DC | PRN

## 2014-05-28 MED ORDER — SODIUM CHLORIDE 0.9 % IR SOLN
Status: DC | PRN
  Administered 2014-05-28: 3000 mL
  Administered 2014-05-28: 1000 mL

## 2014-05-28 MED ORDER — FOLIC ACID 1 MG PO TABS
1.0000 mg | ORAL_TABLET | Freq: Every day | ORAL | Status: DC
  Administered 2014-05-28 – 2014-05-31 (×4): 1 mg via ORAL
  Filled 2014-05-28 (×4): qty 1

## 2014-05-28 MED ORDER — PROPOFOL 10 MG/ML IV BOLUS
INTRAVENOUS | Status: AC
Start: 2014-05-28 — End: 2014-05-28
  Filled 2014-05-28: qty 20

## 2014-05-28 MED ORDER — NALOXONE HCL 0.4 MG/ML IJ SOLN: 0.4000 mg | INTRAMUSCULAR | Status: DC | PRN

## 2014-05-28 MED ORDER — ROPIVACAINE HCL 5 MG/ML IJ SOLN
INTRAMUSCULAR | Status: DC | PRN
  Administered 2014-05-28: 150 mg via PERINEURAL

## 2014-05-28 MED ORDER — ACETAMINOPHEN 500 MG PO TABS
500.0000 mg | ORAL_TABLET | Freq: Four times a day (QID) | ORAL | Status: DC | PRN
  Administered 2014-05-28: 500 mg via ORAL
  Filled 2014-05-28: qty 1

## 2014-05-28 MED ORDER — HYDROMORPHONE HCL PF 1 MG/ML IJ SOLN: 0.2500 mg | INTRAMUSCULAR | Status: DC | PRN

## 2014-05-28 MED ORDER — PROPOFOL 10 MG/ML IV BOLUS
INTRAVENOUS | Status: DC | PRN
  Administered 2014-05-28: 20 mg via INTRAVENOUS
  Administered 2014-05-28: 10 mg via INTRAVENOUS

## 2014-05-28 MED ORDER — HYDROMORPHONE HCL PF 1 MG/ML IJ SOLN
0.5000 mg | INTRAMUSCULAR | Status: DC | PRN
  Administered 2014-05-29: 0.5 mg via INTRAVENOUS
  Filled 2014-05-28: qty 1

## 2014-05-28 MED ORDER — WARFARIN SODIUM 10 MG PO TABS
10.0000 mg | ORAL_TABLET | Freq: Once | ORAL | Status: AC
  Administered 2014-05-28: 10 mg via ORAL
  Filled 2014-05-28: qty 1

## 2014-05-28 MED ORDER — VITAMIN B-1 100 MG PO TABS
100.0000 mg | ORAL_TABLET | Freq: Every day | ORAL | Status: DC
  Administered 2014-05-29 – 2014-05-31 (×3): 100 mg via ORAL
  Filled 2014-05-28 (×4): qty 1

## 2014-05-28 SURGICAL SUPPLY — 54 items
BANDAGE COBAN STERILE 2 (GAUZE/BANDAGES/DRESSINGS) IMPLANT
BANDAGE ELASTIC 3 VELCRO ST LF (GAUZE/BANDAGES/DRESSINGS) ×3 IMPLANT
BANDAGE ELASTIC 4 VELCRO ST LF (GAUZE/BANDAGES/DRESSINGS) ×3 IMPLANT
BNDG COHESIVE 1X5 TAN STRL LF (GAUZE/BANDAGES/DRESSINGS) IMPLANT
BNDG CONFORM 2 STRL LF (GAUZE/BANDAGES/DRESSINGS) IMPLANT
BNDG ESMARK 4X9 LF (GAUZE/BANDAGES/DRESSINGS) ×3 IMPLANT
BNDG GAUZE ELAST 4 BULKY (GAUZE/BANDAGES/DRESSINGS) ×3 IMPLANT
CORDS BIPOLAR (ELECTRODE) ×3 IMPLANT
COVER SURGICAL LIGHT HANDLE (MISCELLANEOUS) ×3 IMPLANT
CUFF TOURNIQUET SINGLE 18IN (TOURNIQUET CUFF) ×3 IMPLANT
DECANTER SPIKE VIAL GLASS SM (MISCELLANEOUS) ×3 IMPLANT
DRAIN PENROSE 1/4X12 LTX STRL (WOUND CARE) ×3 IMPLANT
DRSG ADAPTIC 3X8 NADH LF (GAUZE/BANDAGES/DRESSINGS) IMPLANT
DRSG EMULSION OIL 3X3 NADH (GAUZE/BANDAGES/DRESSINGS) ×3 IMPLANT
DRSG PAD ABDOMINAL 8X10 ST (GAUZE/BANDAGES/DRESSINGS) ×3 IMPLANT
GAUZE PACKING IODOFORM 1/4X15 (GAUZE/BANDAGES/DRESSINGS) ×3 IMPLANT
GAUZE SPONGE 4X4 12PLY STRL (GAUZE/BANDAGES/DRESSINGS) ×3 IMPLANT
GAUZE XEROFORM 1X8 LF (GAUZE/BANDAGES/DRESSINGS) ×3 IMPLANT
GLOVE BIO SURGEON STRL SZ7.5 (GLOVE) ×6 IMPLANT
GLOVE BIOGEL PI IND STRL 8 (GLOVE) ×1 IMPLANT
GLOVE BIOGEL PI INDICATOR 8 (GLOVE) ×2
GOWN STRL REIN XL XLG (GOWN DISPOSABLE) ×3 IMPLANT
HANDPIECE INTERPULSE COAX TIP (DISPOSABLE)
KIT BASIN OR (CUSTOM PROCEDURE TRAY) ×3 IMPLANT
KIT ROOM TURNOVER OR (KITS) ×3 IMPLANT
LOOP VESSEL MAXI BLUE (MISCELLANEOUS) IMPLANT
LOOP VESSEL MINI RED (MISCELLANEOUS) IMPLANT
MANIFOLD NEPTUNE II (INSTRUMENTS) ×3 IMPLANT
NEEDLE HYPO 25X1 1.5 SAFETY (NEEDLE) IMPLANT
NS IRRIG 1000ML POUR BTL (IV SOLUTION) ×3 IMPLANT
PACK ORTHO EXTREMITY (CUSTOM PROCEDURE TRAY) ×3 IMPLANT
PAD ARMBOARD 7.5X6 YLW CONV (MISCELLANEOUS) ×6 IMPLANT
SCRUB BETADINE 4OZ XXX (MISCELLANEOUS) ×3 IMPLANT
SET CYSTO W/LG BORE CLAMP LF (SET/KITS/TRAYS/PACK) ×3 IMPLANT
SET HNDPC FAN SPRY TIP SCT (DISPOSABLE) IMPLANT
SOLUTION BETADINE 4OZ (MISCELLANEOUS) ×3 IMPLANT
SPLINT PLASTER CAST XFAST 3X15 (CAST SUPPLIES) ×1 IMPLANT
SPLINT PLASTER XTRA FASTSET 3X (CAST SUPPLIES) ×2
SPONGE LAP 18X18 X RAY DECT (DISPOSABLE) ×3 IMPLANT
SPONGE LAP 4X18 X RAY DECT (DISPOSABLE) ×3 IMPLANT
SUCTION FRAZIER TIP 10 FR DISP (SUCTIONS) ×6 IMPLANT
SUT ETHILON 3 0 PS 1 (SUTURE) ×3 IMPLANT
SUT ETHILON 4 0 PS 2 18 (SUTURE) ×3 IMPLANT
SUT MON AB 5-0 P3 18 (SUTURE) IMPLANT
SYR CONTROL 10ML LL (SYRINGE) IMPLANT
TOWEL OR 17X24 6PK STRL BLUE (TOWEL DISPOSABLE) ×3 IMPLANT
TOWEL OR 17X26 10 PK STRL BLUE (TOWEL DISPOSABLE) ×3 IMPLANT
TUBE ANAEROBIC SPECIMEN COL (MISCELLANEOUS) ×3 IMPLANT
TUBE CONNECTING 12'X1/4 (SUCTIONS) ×1
TUBE CONNECTING 12X1/4 (SUCTIONS) ×2 IMPLANT
TUBE FEEDING 5FR 15 INCH (TUBING) IMPLANT
UNDERPAD 30X30 INCONTINENT (UNDERPADS AND DIAPERS) ×3 IMPLANT
WATER STERILE IRR 1000ML POUR (IV SOLUTION) ×3 IMPLANT
YANKAUER SUCT BULB TIP NO VENT (SUCTIONS) ×3 IMPLANT

## 2014-05-28 NOTE — Progress Notes (Signed)
Paged Dr. Candiss Norse about patient's temp of 103. Tylenol PRN and blood cultures ordered. Will continue to monitor.

## 2014-05-28 NOTE — Evaluation (Signed)
Clinical/Bedside Swallow Evaluation Patient Details  Name: Robert [name] MRN: [id number] Date of Birth: 1961-04-14  Today's Date: 05/28/2014 Time: 1020-1044 SLP Time Calculation (min): 24 min  Past Medical History:  Past Medical History  Diagnosis Date  . Deep vein thrombosis     "several"  . Pulmonary emboli     "several"  . COPD (chronic obstructive pulmonary disease)   . Partial small bowel obstruction   . Myocardial infarction 2007; ~ 2011  . Chronic liver failure   . Shortness of breath     "all the time"  . Oxygen dependent     3L; 24/7" (05/27/2014)  . History of blood transfusion 2013    "related to kidneys shutting down"  . Hepatitis C   . Depression   . Hypertension   . Hypothyroidism   . Asthma   . Pneumonia     "several times"  . GERD (gastroesophageal reflux disease)   . Migraines     "2-3/day" (05/27/2014)  . Stroke ~ 03/2012    "couldn't use my left hand for ~ 6 months" (05/27/2014)  . Stroke 05/25/2014    "not able to use my left hand again" (05/27/2014)  . Arthritis     "I'm eat up w/it"  . Chronic lower back pain   . Anxiety   . Intermittent self-catheterization of bladder   . Liver cancer   . Metastatic lung cancer     "left"  . B12 deficiency     "give myself shots"   Past Surgical History:  Past Surgical History  Procedure Laterality Date  . Transurethral resection of prostate  2012; 2014  . Tonsillectomy and adenoidectomy  1974  . Knee surgery Left ~ 1972    "cut top of my kneecap off"  . Multiple tooth extractions  11/2010    "16"   HPI:  53 y.o. male with multiple medical problems including HTN, metastatic lung cancer, coronary artery disease, CVA with left-sided weakness, COPD, admitted to [facility] due to left hand cellulitis and left sided numbness with dysarthria. Non focal neuro-exam at this moment. Pt underwent excision and draining of left wrist 8/28 am.  Orders received for swallow evaluation.   Assessment / Plan /  Recommendation Clinical Impression  Pt fatigued, mildly confused, required cues to keep eyes opened/participate in eval.  Presents with normal oropharyngeal swallow function with adequate mastication, swift swallow trigger, and no s/s of aspiration.  Recommend resuming a regular diet with thin liquids - no f/u needed.             Diet Recommendation Regular;Thin liquid   Liquid Administration via: Cup Medication Administration: Whole meds with liquid Supervision: Patient able to self feed Postural Changes and/or Swallow Maneuvers: Seated upright 90 degrees    Other  Recommendations Oral Care Recommendations: Oral care BID   Follow Up Recommendations  None         Swallow Study Prior Functional Status       General Date of Onset: 05/27/14 HPI: 53 y.o. male with multiple medical problems including HTN, metastatic lung cancer, coronary artery disease, CVA with left-sided weakness, COPD, admitted to [facility] due to left hand cellulitis and left sided numbness with dysarthria. Non focal neuro-exam at this moment. Pt underwent excision and draining of left wrist 8/28 am.  Orders received for swallow evaluation. Type of Study: Bedside swallow evaluation Previous Swallow Assessment: none per records Diet Prior to this Study: NPO Temperature Spikes Noted: No Respiratory Status: Room air Behavior/Cognition: Alert;Confused  Oral Cavity - Dentition: Missing dentition;Dentures, top Self-Feeding Abilities: Able to feed self Patient Positioning: Upright in bed Baseline Vocal Quality: Clear Volitional Cough: Strong Volitional Swallow: Able to elicit    Oral/Motor/Sensory Function Overall Oral Motor/Sensory Function: Appears within functional limits for tasks assessed   Ice Chips Ice chips: Within functional limits Presentation: Spoon   Thin Liquid Thin Liquid: Within functional limits Presentation: Cup;Self Fed    Nectar Thick Nectar Thick Liquid: Not tested   Honey Thick Honey Thick  Liquid: Not tested   Puree Puree: Within functional limits Presentation: [place]. [place], [place] CCC/SLP Pager [phone]     Solid: Within functional limits Presentation: Self Fed       Juan Quam Laurice 05/28/2014,11:19 AM

## 2014-05-28 NOTE — Evaluation (Signed)
Physical Therapy Evaluation Patient Details Name: Robert [name] MRN: [id number] DOB: Jul 09, 1961 Today's Date: 05/28/2014   History of Present Illness  Robert [name] is a 53 y.o. 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  Clinical Impression  Pt demonstrates decreased arousal, decreased sitting balance, decreased standing dynamic balance and staggered gait pattern.  Requires mod A for gait at this time, however had a hard time keeping his eyes open during gait.  Could not get a clear picture of PLOF due to decreased arousal and difficulty understanding patient.  Feel pt will benefit from skilled acute services to address deficits.  Recommend SNF for follow up to increase pts safety and independence.           Follow Up Recommendations SNF;Supervision/Assistance - 24 hour    Equipment Recommendations  Other (comment) (TBD)    Recommendations for Other Services OT consult     Precautions / Restrictions Precautions Precautions: Fall Precaution Comments: hx of L hemiparesis (functional w/ cane per pt), hx of ETOH Restrictions Weight Bearing Restrictions: No      Mobility  Bed Mobility Overal bed mobility: Needs Assistance Bed Mobility: Supine to Sit     Supine to sit: Max assist;Mod assist     General bed mobility comments: Pt's friend attempted to assist pt, however provided cues that therapist wanting to see what pt could do.  Pt able to get into sitting, however demonstrates poor sitting balance with R lateral lean and decreased protective responses.   Transfers Overall transfer level: Needs assistance Equipment used: None Transfers: Sit to/from Stand Sit to Stand: Mod assist         General transfer comment: Pt requires mod A for forward weight shift.  Once in standing noted increased  posterior lean and staggering steps.    Ambulation/Gait Ambulation/Gait assistance: Mod assist Ambulation Distance (Feet): 100 Feet Assistive device: None (therapist assist around waist and at R axilla) Gait Pattern/deviations: Step-through pattern;Staggering left;Staggering right;Leaning posteriorly;Narrow base of support;Trunk flexed     General Gait Details: Pt with very staggered gait pattern and tends to drift R and L with intermittent stopping and would lose balance posteriorly.  Max cues to keep eyes open during gait.  Pt kept talking about having "drugs laying on my table at home."  RN made aware.   Stairs            Wheelchair Mobility    Modified Rankin (Stroke Patients Only)       Balance Overall balance assessment: History of Falls;Needs assistance Sitting-balance support: Feet supported;Single extremity supported Sitting balance-Leahy Scale: Zero   Postural control: Posterior lean;Right lateral lean Standing balance support: No upper extremity supported;During functional activity Standing balance-Leahy Scale: Poor                               Pertinent Vitals/Pain Pain Assessment: Faces Pain Score: 6  Pain Location: L hand Pain Descriptors / Indicators: Aching Pain Intervention(s): Patient requesting pain meds-RN notified    Home Living Family/patient expects to be discharged to:: Private residence Living Arrangements: Spouse/significant other Available Help at Discharge: Family;Available 24 hours/day Type of Home: Apartment Home Access: Stairs to enter Entrance Stairs-Rails: Right Entrance Stairs-Number of Steps: 20 Home Layout: One level Home Equipment: Cane - single point Additional Comments: Pt very  lethargic, difficult to get clear picture of PLOF    Prior Function Level of Independence: Needs assistance   Gait / Transfers Assistance Needed: Unclear of PLOF due to pts decreased arousal and unsure of baseline cognition, however  states "I"ve been getting help for a while"           Hand Dominance   Dominant Hand: Right    Extremity/Trunk Assessment               Lower Extremity Assessment: Generalized weakness      Cervical / Trunk Assessment: Other exceptions  Communication   Communication: Expressive difficulties  Cognition Arousal/Alertness: Suspect due to medications Behavior During Therapy: Restless Overall Cognitive Status: No family/caregiver present to determine baseline cognitive functioning       Memory: Decreased short-term memory              General Comments      Exercises        Assessment/Plan    PT Assessment Patient needs continued PT services  PT Diagnosis Difficulty walking;Generalized weakness;Acute pain   PT Problem List Decreased strength;Decreased activity tolerance;Decreased balance;Decreased mobility;Decreased coordination;Decreased cognition;Decreased knowledge of use of DME;Decreased safety awareness;Decreased knowledge of precautions;Decreased skin integrity  PT Treatment Interventions DME instruction;Gait training;Stair training;Functional mobility training;Therapeutic activities;Therapeutic exercise;Balance training;Patient/family education   PT Goals (Current goals can be found in the Care Plan section) Acute Rehab PT Goals Patient Stated Goal: none stated PT Goal Formulation: Patient unable to participate in goal setting Time For Goal Achievement: 06/11/14 Potential to Achieve Goals: Fair    Frequency Min 3X/week   Barriers to discharge Decreased caregiver support      Co-evaluation               End of Session Equipment Utilized During Treatment: Gait belt Activity Tolerance: Patient limited by fatigue;Patient limited by lethargy Patient left: in chair;with call bell/phone within reach (notified nurse tech to get chair alarm) Nurse Communication: Mobility status         Time: 5537-4827 PT Time Calculation (min): 28  min   Charges:   PT Evaluation $Initial PT Evaluation Tier I: 1 Procedure PT Treatments $Gait Training: 8-22 mins $Therapeutic Activity: 8-22 mins   PT G Codes:          Denice Bors 05/28/2014, 12:39 PM

## 2014-05-28 NOTE — Anesthesia Postprocedure Evaluation (Signed)
Anesthesia Post Note  Patient: Robert [name]  Procedure(s) Performed: Procedure(s) (LRB): INCISION AND DRAINAGE ABSCESS LEFT WRIST (Left)  Anesthesia type: MAC  Patient location: PACU  Post pain: Pain level controlled  Post assessment: Patient's Cardiovascular Status Stable  Last Vitals:  Filed Vitals:   05/28/14 0555  BP: 105/70  Pulse: 96  Temp: 36.7 C  Resp: 20    Post vital signs: Reviewed and stable  Level of consciousness: sedated  Complications: No apparent anesthesia complications

## 2014-05-28 NOTE — Op Note (Signed)
[id number] 

## 2014-05-28 NOTE — Transfer of Care (Signed)
Immediate Anesthesia Transfer of Care Note  Patient: Robert [name]  Procedure(s) Performed: Procedure(s): INCISION AND DRAINAGE ABSCESS LEFT WRIST (Left)  Patient Location: PACU  Anesthesia Type:MAC combined with regional for post-op pain  Level of Consciousness: awake, alert  and oriented  Airway & Oxygen Therapy: Patient connected to nasal cannula oxygen  Post-op Assessment: Report given to PACU RN and Post -op Vital signs reviewed and stable  Post vital signs: Reviewed and stable  Complications: No apparent anesthesia complications

## 2014-05-28 NOTE — Op Note (Signed)
Robert [name], Robert [name] NO.:  [id number]  MEDICAL RECORD NO.:  [id number]  LOCATION:  [id number]                        FACILITY:  [facility]  PHYSICIAN:  Leanora Cover, MD        DATE OF BIRTH:  [birth year]  DATE OF PROCEDURE:  05/28/2014 DATE OF DISCHARGE:                              OPERATIVE REPORT   PREOPERATIVE DIAGNOSIS:  Left wrist and hand abscess.  POSTOPERATIVE DIAGNOSIS:  Left wrist and hand abscess.  PROCEDURE:  Incision and drainage of left wrist and hand abscess and wrist joint.  SURGEON:  Leanora Cover, MD.  ASSISTANT:  None.  ANESTHESIA:  Regional.  IV FLUIDS:  Per Anesthesia flow sheet.  ESTIMATED BLOOD LOSS:  Minimal.  COMPLICATIONS:  None.  SPECIMENS:  Left wrist and hand abscess cultures to micro.  TOURNIQUET TIME:  Less than 30 minutes.  DISPOSITION:  Stable to PACU.  INDICATIONS:  Robert [name] is a 53 year old male who has had swelling, pain, and erythema of the left hand and wrist for approximately 2 days. He was seen at [facility] and transferred to the hospitalist who admitted him at [facility] and I was consulted for management of the left hand and wrist infection.  On examination, he had intact sensation and capillary refill in all fingertips.  He had pain with motion of the digits and wrist.  He was tender on the dorsum of the hand.  He has had erythema, induration, and swelling.  I recommended him going to the operating room for incision and drainage of the abscess and possibly the wrist joint.  Risks, benefits, and alternatives of surgery were discussed including risk of blood loss, infection, damage to nerves, vessels, tendons, ligaments, bone, failure of surgery, need for additional surgery, complications with wound healing, continued pain, continued infection, need for repeat irrigation and debridement.  They voiced understanding of these risks and elected to proceed.  OPERATIVE COURSE:  After being  identified preoperatively by myself, the patient and I agreed upon procedure and site of procedure.  Surgical site was marked.  The risks, benefits, and alternatives of surgery were reviewed and he wished to proceed.  Surgical consent had been signed. He was on scheduled antibiotics.  He was transferred to the operating room and placed on the operating room table in supine position with left upper extremity on arm board.  A regional block had been performed by Anesthesia in preoperative holding.  Left upper extremity was prepped and draped in normal sterile orthopedic fashion.  A surgical pause was performed between surgeons, anesthesia, and operating room staff, and all were in agreement as to the patient, procedure, and site of procedure.  Tourniquet at the proximal aspect of the extremity was inflated to 250 mmHg after gravity exsanguination of the hand and wrist and Esmarch exsanguination of the proximal forearm.  Incision was made in the dorsum of the hand and wrist and carried into subcutaneous tissues by spreading technique.  Gross purulence was encountered. Cultures were taken for aerobes, anaerobes, and Gram stain.  The purulence was milked.  It appeared to be coming mostly from the dorsal ulnar aspect of the hand.  It was  followed and was deep to the tendons. It did appear to be coursing up from the wrist joint and the wrist joint was slightly boggy.  The joint was entered.  There was a small rent in the capsule.  There was no gross purulence within the joint.  Synovial fluid was encountered.  The rongeurs were used to debride the abscess cavity of any devitalized tissues.  The joint and abscess cavity were irrigated with 3000 mL of sterile saline by cysto tubing.  The wound was then packed with quarter-inch iodoform gauze.  A gauze wick was placed in the joint along with a Penrose drain.  The wound was dressed with sterile 4x4s and ABD and wrapped with Kerlix bandage lightly.  A  volar splint was placed and wrapped with a Kerlix, Coban, and Ace dressing lightly.  The tourniquet was deflated at less than 30 minutes. Fingertips were pink with brisk capillary refill after deflation of the tourniquet.  The operative drapes were broken down and the patient was awoken, transferred back to the stretcher and taken to PACU in stable condition.  He will be continued on the hospitalist Service.  We will continue with IV antibiotics.  In 2-3 days, we will start hydrotherapy with packing change.     Leanora Cover, MD     KK/MEDQ  D:  05/28/2014  T:  05/28/2014  Job:  [id number]

## 2014-05-28 NOTE — Brief Op Note (Signed)
05/27/2014 - 05/28/2014  3:45 AM  PATIENT:  Robert [name]  53 y.o. male  PRE-OPERATIVE DIAGNOSIS:  abscess left wrist  POST-OPERATIVE DIAGNOSIS:  abscess left wrist  PROCEDURE:  Procedure(s): INCISION AND DRAINAGE ABSCESS LEFT WRIST (Left)  SURGEON:  Surgeon(s) and Role:    * Leanora Cover, MD - Primary  PHYSICIAN ASSISTANT:   ASSISTANTS: none   ANESTHESIA:   regional  EBL:  Total I/O In: -  Out: 300 [Urine:300]  BLOOD ADMINISTERED:none  DRAINS: iodoform packing in wound, penrose drain in wrist joint  LOCAL MEDICATIONS USED:  NONE  SPECIMEN:  Source of Specimen:  left wrist abscess  DISPOSITION OF SPECIMEN:  micro  COUNTS:  YES  TOURNIQUET:  * Missing tourniquet times found for documented tourniquets in log:  [id number] *  DICTATION: .Other Dictation: Dictation Number [id number]  PLAN OF CARE: Discharge to home after PACU  PATIENT DISPOSITION:  PACU - hemodynamically stable.   Delay start of Pharmacological VTE agent (>24hrs) due to surgical blood loss or risk of bleeding: no

## 2014-05-28 NOTE — Consult Note (Signed)
Referring Physician: Dr. Posey Pronto    Chief Complaint: left sided numbness, dysarthria.  HPI:                                                                                                                                         Robert [name] is an 53 y.o. 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 Patient tells me that he had a prior stroke that resulted on some left sided weakness and speech changes that dramatically improved with therapy. However, his wife noticed that since yesterday his speech is " slurred ' and he also complains of left arm-face-leg numbness. Mr. Quebedeaux complains of having unbearable pain in his left hand and forearm as well as HA, but denies vertigo, double vision, difficulty swallowing, confusion, or visual disturbances. CT head showed no acute intracranial abnormality. He is on coumadin. Date last known well: uncertain Time last known well: uncertain. tPA Given: no, out of the window.   Past Medical History  Diagnosis Date  . Deep vein thrombosis     "several"  . Pulmonary emboli     "several"  . COPD (chronic obstructive pulmonary disease)   . Partial small bowel obstruction   . Myocardial infarction 2007; ~ 2011  . Chronic liver failure   . Shortness of breath     "all the time"  . Oxygen dependent     3L; 24/7" (05/27/2014)  . History of blood transfusion 2013    "related to kidneys shutting down"  . Hepatitis C   . Depression   . Hypertension   . Hypothyroidism   . Asthma   . Pneumonia     "several times"  . GERD (gastroesophageal reflux disease)   . Migraines     "2-3/day" (05/27/2014)  . Stroke ~ 03/2012    "couldn't use my left hand for ~ 6 months"  (05/27/2014)  . Stroke 05/25/2014    "not able to use my left hand again" (05/27/2014)  . Arthritis     "I'm eat up w/it"  . Chronic lower back pain   . Anxiety   . Intermittent self-catheterization of bladder   . Liver cancer   . Metastatic lung cancer     "left"  . B12 deficiency     "give myself shots"    Past Surgical History  Procedure Laterality Date  . Transurethral resection of prostate  2012; 2014  . Tonsillectomy and adenoidectomy  1974  . Knee surgery Left ~ 1972    "cut top of my kneecap off"  . Multiple tooth extractions  11/2010    "26"    Family History  Problem Relation Age of  Onset  . Cancer Mother   . Cancer Paternal Aunt   . Cancer Maternal Aunt    Social History:  reports that he has been smoking Cigarettes.  He has a 20.5 pack-year smoking history. He has quit using smokeless tobacco. He reports that he drinks about 3.6 ounces of alcohol per week. He reports that he uses illicit drugs (Cocaine).  Allergies:  Allergies  Allergen Reactions  . Morphine And Related Itching and Rash    Medications:                                                                                                                           I have reviewed the patient's current medications. Scheduled: . cefTRIAXone      . DULoxetine  60 mg Oral BID  . gabapentin  400 mg Oral TID  . levothyroxine  50 mcg Oral QAC breakfast  . OxyCODONE  20 mg Oral Q12H  . pantoprazole  40 mg Oral Daily  . piperacillin-tazobactam (ZOSYN)  IV  3.375 g Intravenous Q8H  . piperacillin-tazobactam  3.375 g Intravenous Once  . pneumococcal 23 valent vaccine  0.5 mL Intramuscular Tomorrow-1000  . polyethylene glycol  17 g Oral Daily  . sodium chloride  3 mL Intravenous Q12H  . tamsulosin  0.4 mg Oral QHS  . vancomycin  1,000 mg Intravenous Q12H  . Warfarin - Pharmacist Dosing Inpatient   Does not apply q1800    ROS:                                                                                                                                        History obtained from the patient and chart review.  General ROS: negative for - chills, fatigue, fever, night sweats, weight gain or weight loss Psychological ROS: negative for - behavioral disorder, hallucinations, memory difficulties, mood swings or suicidal ideation Ophthalmic ROS: negative for - blurry vision, double vision, eye pain or loss of vision ENT ROS: negative for - epistaxis, nasal discharge, oral lesions, sore throat, tinnitus or vertigo Allergy and Immunology ROS: negative for - hives or itchy/watery eyes Hematological and Lymphatic ROS: negative for - bleeding problems, bruising or swollen lymph nodes Endocrine ROS: negative for - galactorrhea, hair pattern changes, polydipsia/polyuria or temperature intolerance Respiratory ROS: negative for - cough, hemoptysis, shortness of breath or wheezing Cardiovascular ROS: negative for -  chest pain, dyspnea on exertion, edema or irregular heartbeat Gastrointestinal ROS: negative for - abdominal pain, diarrhea, hematemesis, nausea/vomiting or stool incontinence Genito-Urinary ROS: negative for - dysuria, hematuria, incontinence or urinary frequency/urgency Musculoskeletal ROS: significant for - joint swelling left wrist Neurological ROS: as noted in HPI Dermatological ROS: negative for rash and skin lesion changes  Physical exam: pleasant male in distress due to left arm-hand pain. Blood pressure 124/84, pulse 98, temperature 97.7 F (36.5 C), temperature source Oral, resp. rate 18, height _0  (1.702 m), weight 70.943 kg (156 lb 6.4 oz), SpO2 100.00%. Head: normocephalic. Neck: supple, no bruits, no JVD. Cardiac: no murmurs. Lungs: clear. Abdomen: soft, no tender, no mass. Extremities: left hand and distal forearm swelling with erythema.   Neurologic Examination:                                                                                                       General: Mental Status: Alert, oriented, thought content appropriate.  Speech fluent without evidence of aphasia.  Able to follow 3 step commands without difficulty. Cranial Nerves: II: Discs flat bilaterally; Visual fields grossly normal, pupils equal, round, reactive to light and accommodation III,IV, VI: ptosis not present, extra-ocular motions intact bilaterally V,VII: smile symmetric, facial light touch sensation normal bilaterally VIII: hearing normal bilaterally IX,X: gag reflex present XI: bilateral shoulder shrug XII: midline tongue extension without atrophy or fasciculations Motor: Right : Upper extremity   5/5    Left:     Upper extremity   5/5  Lower extremity   5/5     Lower extremity   5/5 Tone and bulk:normal tone throughout; no atrophy noted Sensory: questionable decreased light touch/pinprick left arm Deep Tendon Reflexes:  1+ all over Plantars: Right: downgoing   Left: downgoing Cerebellar: normal finger-to-nose,  normal heel-to-shin test Gait:  No tested   Results for orders placed during the hospital encounter of 05/27/14 (from the past 48 hour(s))  CBC WITH DIFFERENTIAL     Status: Abnormal   Collection Time    05/27/14  4:21 PM      Result Value Ref Range   WBC 18.6 (*) 4.0 - 10.5 K/uL   RBC 4.17 (*) 4.22 - 5.81 MIL/uL   Hemoglobin 13.0  13.0 - 17.0 g/dL   HCT 39.3  39.0 - 52.0 %   MCV 94.2  78.0 - 100.0 fL   MCH 31.2  26.0 - 34.0 pg   MCHC 33.1  30.0 - 36.0 g/dL   RDW 16.7 (*) 11.5 - 15.5 %   Platelets 329  150 - 400 K/uL   Neutrophils Relative % 84 (*) 43 - 77 %   Lymphocytes Relative 7 (*) 12 - 46 %   Monocytes Relative 6  3 - 12 %   Eosinophils Relative 2  0 - 5 %   Basophils Relative 1  0 - 1 %   Neutro Abs 15.6 (*) 1.7 - 7.7 K/uL   Lymphs Abs 1.3  0.7 - 4.0 K/uL   Monocytes Absolute 1.1 (*) 0.1 - 1.0 K/uL  Eosinophils Absolute 0.4  0.0 - 0.7 K/uL   Basophils Absolute 0.2 (*) 0.0 - 0.1 K/uL   RBC Morphology ELLIPTOCYTES     WBC  Morphology ATYPICAL LYMPHOCYTES     Smear Review LARGE PLATELETS PRESENT    COMPREHENSIVE METABOLIC PANEL     Status: Abnormal   Collection Time    05/27/14  4:21 PM      Result Value Ref Range   Sodium 137  137 - 147 mEq/L   Potassium 4.7  3.7 - 5.3 mEq/L   Chloride 98  96 - 112 mEq/L   CO2 23  19 - 32 mEq/L   Glucose, Bld 93  70 - 99 mg/dL   BUN 7  6 - 23 mg/dL   Creatinine, Ser 0.90  0.50 - 1.35 mg/dL   Calcium 9.3  8.4 - 10.5 mg/dL   Total Protein 6.9  6.0 - 8.3 g/dL   Albumin 2.8 (*) 3.5 - 5.2 g/dL   AST 43 (*) 0 - 37 U/L   Comment: SLIGHT HEMOLYSIS     HEMOLYSIS AT THIS LEVEL MAY AFFECT RESULT   ALT 31  0 - 53 U/L   Alkaline Phosphatase 66  39 - 117 U/L   Total Bilirubin <0.2 (*) 0.3 - 1.2 mg/dL   GFR calc non Af Amer >90  >90 mL/min   GFR calc Af Amer >90  >90 mL/min   Comment: (NOTE)     The eGFR has been calculated using the CKD EPI equation.     This calculation has not been validated in all clinical situations.     eGFR's persistently <90 mL/min signify possible Chronic Kidney     Disease.   Anion gap 16 (*) 5 - 15  URINALYSIS, ROUTINE W REFLEX MICROSCOPIC     Status: Abnormal   Collection Time    05/27/14  4:52 PM      Result Value Ref Range   Color, Urine YELLOW  YELLOW   APPearance CLEAR  CLEAR   Specific Gravity, Urine 1.009  1.005 - 1.030   pH 6.0  5.0 - 8.0   Glucose, UA NEGATIVE  NEGATIVE mg/dL   Hgb urine dipstick NEGATIVE  NEGATIVE   Bilirubin Urine NEGATIVE  NEGATIVE   Ketones, ur NEGATIVE  NEGATIVE mg/dL   Protein, ur NEGATIVE  NEGATIVE mg/dL   Urobilinogen, UA 0.2  0.0 - 1.0 mg/dL   Nitrite POSITIVE (*) NEGATIVE   Leukocytes, UA TRACE (*) NEGATIVE  URINE MICROSCOPIC-ADD ON     Status: Abnormal   Collection Time    05/27/14  4:52 PM      Result Value Ref Range   Squamous Epithelial / LPF RARE  RARE   WBC, UA 0-2  <3 WBC/hpf   Bacteria, UA FEW (*) RARE  PROTIME-INR     Status: Abnormal   Collection Time    05/27/14  5:00 PM      Result  Value Ref Range   Prothrombin Time 21.0 (*) 11.6 - 15.2 seconds   INR 1.81 (*) 0.00 - 1.49  TROPONIN I     Status: None   Collection Time    05/27/14  5:00 PM      Result Value Ref Range   Troponin I <0.30  <0.30 ng/mL   Comment:            Due to the release kinetics of cTnI,     a negative result within the first hours     of the  onset of symptoms does not rule out     myocardial infarction with certainty.     If myocardial infarction is still suspected,     repeat the test at appropriate intervals.  CK     Status: None   Collection Time    05/27/14 11:12 PM      Result Value Ref Range   Total CK 48  7 - 232 U/L  TROPONIN I     Status: None   Collection Time    05/27/14 11:12 PM      Result Value Ref Range   Troponin I <0.30  <0.30 ng/mL   Comment:            Due to the release kinetics of cTnI,     a negative result within the first hours     of the onset of symptoms does not rule out     myocardial infarction with certainty.     If myocardial infarction is still suspected,     repeat the test at appropriate intervals.   Dg Chest 2 View  05/27/2014   CLINICAL DATA:  Difficulty breathing and pain  EXAM: CHEST  2 VIEW  COMPARISON:  Chest radiograph December 08, 2010 and chest CT May 30, 2012  FINDINGS: There is patchy bibasilar atelectatic change. Elsewhere lungs are clear. Heart size and pulmonary vascularity are normal. No adenopathy. No pneumothorax. No appreciable bone lesions.  IMPRESSION: Patchy bibasilar atelectatic change. Elsewhere lungs are clear. No demonstrable pneumothorax.   Electronically Signed   By: Lowella Grip M.D.   On: 05/27/2014 17:39   Ct Head Wo Contrast  05/27/2014   CLINICAL DATA:  Slurred speech  EXAM: CT HEAD WITHOUT CONTRAST  TECHNIQUE: Contiguous axial images were obtained from the base of the skull through the vertex without intravenous contrast.  COMPARISON:  None.  FINDINGS: No acute intracranial hemorrhage. No focal mass lesion. No CT  evidence of acute infarction. No midline shift or mass effect. No hydrocephalus. Basilar cisterns are patent. Minimal periventricular white matter hypodensity.  Mastoid air cells are clear. There is frothy material within the maxillary sinuses. Frontal sinuses are clear.  IMPRESSION: 1. No acute intracranial findings. 2. Maxillary sinusitis.   Electronically Signed   By: Suzy Bouchard M.D.   On: 05/27/2014 17:43   Dg Hand Complete Left  05/27/2014   CLINICAL DATA:  Pain and redness.  Swelling.  EXAM: LEFT HAND - COMPLETE 3+ VIEW  COMPARISON:  None.  FINDINGS: Mild first MCP joint osteoarthritis. Anatomic alignment. No fracture. Diffuse swelling of the hand. No gas in the soft tissues. No erosive changes of bone. Benign osteochondroma projects off the radial and volar aspect of the first metacarpal.  IMPRESSION: Diffuse soft tissue swelling of the hand. No acute osseous abnormality.   Electronically Signed   By: Dereck Ligas M.D.   On: 05/27/2014 17:37    Assessment: 53 y.o. male with multiple medical problems, admitted to [facility] due to left hand cellulitis and left sided numbness with dysarthria. Non focal neuro-exam at this moment. Unclear when his symptoms started. Right brain syndrome?.  Recommend: MRI brain. Further stroke work up depending on MRI results. Will follow up.   Dorian Pod, MD [facility] [phone]  05/28/2014, 12:02 AM

## 2014-05-28 NOTE — Progress Notes (Addendum)
Patient Demographics  Robert [name], is a 53 y.o. male, DOB - Feb 06, 1961, VFI:[id number]  Admit date - 05/27/2014   Admitting Physician Alphia Moh, MD  Outpatient Primary MD for the patient is No PCP Per Patient  LOS - 1   Chief Complaint  Patient presents with  . Cellulitis        Subjective:   Robert [name] today has, No headache, No chest pain, No abdominal pain - No Nausea, No new weakness tingling or numbness, No Cough - SOB. He has some postop left arm pain. Is somewhat sleepy but answering all questions appropriately.  Assessment & Plan    1. Sepsis due to Cellulitis and abscess of the left hand, wrist and wrist joint. Status post incision and drainage by Dr. Fredna Dow on 05/27/2014, follow cultures, continue empiric IV antibiotics which are vancomycin along with Zosyn. Supportive care and gentle hydration.   2. 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 Not a candidate for chemoradiation all surgery per wife, he was recommended for hospice treatment but he woke hospice treatment few weeks ago, supportive care only. He currently wishes to be full code. Long-term prognosis poor.   3. 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 Pharmacy adjusting dose. We'll try to gently uptitrated in the light of recent surgery.   4. Chronic pain. Minimize narcotics and benzodiazepines as he is somewhat somnolent, as needed Narcan. Counseled not to overuse narcotics and sedative medications.    5. History of alcohol abuse. Counseled to quit, CIWA protocol.     6. Left arm paresthesias tingling numbness. Likely due to #1 above. Seen by neuro, CT head unremarkable. Await MRI.      Code Status: Full  Family Communication: Wife bedside  Disposition Plan: Home   Procedures  CT Head, X ray L  hand, his drainage of left hand, wrist and wrist joint   Consults  Neuro, Hand surgery   Medications  Scheduled Meds: . antiseptic oral rinse  7 mL Mouth Rinse BID  . Chlorhexidine Gluconate Cloth  6 each Topical Q0600  . DULoxetine  60 mg Oral BID  . gabapentin  400 mg Oral TID  . levothyroxine  50 mcg Oral QAC breakfast  . mupirocin ointment  1 application Nasal BID  . OxyCODONE  20 mg Oral Q12H  . pantoprazole  40 mg Oral Daily  . piperacillin-tazobactam (ZOSYN)  IV  3.375 g Intravenous Q8H  . polyethylene glycol  17 g Oral Daily  . sodium chloride  3 mL Intravenous Q12H  . tamsulosin  0.4 mg Oral QHS  . vancomycin  1,000 mg Intravenous Q12H  . Warfarin - Pharmacist Dosing Inpatient   Does not apply q1800   Continuous Infusions: . sodium chloride     PRN Meds:.ALPRAZolam, HYDROmorphone (DILAUDID) injection, naLOXone (NARCAN)  injection, ondansetron (ZOFRAN) IV, oxycodone, senna-docusate  DVT Prophylaxis  Coumadin  Lab Results  Component Value Date   PLT 325 05/28/2014    Antibiotics     Anti-infectives   Start     Dose/Rate Route Frequency Ordered Stop   05/28/14 0600  piperacillin-tazobactam (ZOSYN) IVPB 3.375 g     3.375 g 12.5 mL/hr over 240 Minutes Intravenous Every 8 hours 05/27/14 2318     05/28/14  0500  vancomycin (VANCOCIN) IVPB 1000 mg/200 mL premix     1,000 mg 200 mL/hr over 60 Minutes Intravenous Every 12 hours 05/27/14 2318     05/27/14 2330  piperacillin-tazobactam (ZOSYN) IVPB 3.375 g     3.375 g 100 mL/hr over 30 Minutes Intravenous  Once 05/27/14 2318 05/28/14 0142   05/27/14 1805  cefTRIAXone (ROCEPHIN) 1 G injection    Comments:  Malon Kindle   : cabinet override      05/27/14 1805 05/28/14 0614   05/27/14 1730  cefTRIAXone (ROCEPHIN) 1 g in dextrose 5 % 50 mL IVPB     1 g 100 mL/hr over 30 Minutes Intravenous  Once 05/27/14 1723 05/27/14 1853   05/27/14 1645  vancomycin (VANCOCIN) IVPB 1000 mg/200 mL premix     1,000 mg 200 mL/hr over  60 Minutes Intravenous  Once 05/27/14 1643 05/27/14 1809          Objective:   Filed Vitals:   05/28/14 0400 05/28/14 0410 05/28/14 0500 05/28/14 0555  BP: 114/78 123/79  105/70  Pulse: 96 95  96  Temp:  98.5 F (36.9 C)  98 F (36.7 C)  TempSrc:    Oral  Resp: 20 25  20   Height:      Weight:   70.1 kg (154 lb 8.7 oz)   SpO2: 100% 100%  98%    Wt Readings from Last 3 Encounters:  05/28/14 70.1 kg (154 lb 8.7 oz)  05/28/14 70.1 kg (154 lb 8.7 oz)  01/22/14 66.679 kg (147 lb)     Intake/Output Summary (Last 24 hours) at 05/28/14 1006 Last data filed at 05/28/14 0435  Gross per 24 hour  Intake    200 ml  Output    800 ml  Net   -600 ml     Physical Exam  Somnolent , Oriented X 3, No new F.N deficits, Normal affect [place].AT,PERRAL Supple Neck,No JVD, No cervical lymphadenopathy appriciated.  Symmetrical Chest wall movement, Good air movement bilaterally, CTAB RRR,No Gallops,Rubs or new Murmurs, No Parasternal Heave +ve B.Sounds, Abd Soft, No tenderness, No organomegaly appriciated, No rebound - guarding or rigidity. No Cyanosis, Clubbing or edema, No new Rash or bruise   L arm in splint   Data Review   Micro Results Recent Results (from the past 240 hour(s))  SURGICAL PCR SCREEN     Status: Abnormal   Collection Time    05/28/14  1:31 AM      Result Value Ref Range Status   MRSA, PCR POSITIVE (*) NEGATIVE Final   Comment: RESULT CALLED TO, READ BACK BY AND VERIFIED WITH:     R YNOP,RN [phone] SKEENP/WILDERK   Staphylococcus aureus POSITIVE (*) NEGATIVE Final   Comment: RESULT CALLED TO, READ BACK BY AND VERIFIED WITHMadie Reno [phone] SKEENP/WILDERK                The Xpert SA Assay (FDA     approved for NASAL specimens     in patients over 62 years of age),     is one component of     a comprehensive surveillance     program.  Test performance has     been validated by [employer] for patients greater     than or equal to 44 year  old.     It is not intended     to diagnose infection nor to  guide or monitor treatment.    Radiology Reports Dg Chest 2 View  05/27/2014   CLINICAL DATA:  Difficulty breathing and pain  EXAM: CHEST  2 VIEW  COMPARISON:  Chest radiograph December 08, 2010 and chest CT May 30, 2012  FINDINGS: There is patchy bibasilar atelectatic change. Elsewhere lungs are clear. Heart size and pulmonary vascularity are normal. No adenopathy. No pneumothorax. No appreciable bone lesions.  IMPRESSION: Patchy bibasilar atelectatic change. Elsewhere lungs are clear. No demonstrable pneumothorax.   Electronically Signed   By: Lowella Grip M.D.   On: 05/27/2014 17:39   Ct Head Wo Contrast  05/27/2014   CLINICAL DATA:  Slurred speech  EXAM: CT HEAD WITHOUT CONTRAST  TECHNIQUE: Contiguous axial images were obtained from the base of the skull through the vertex without intravenous contrast.  COMPARISON:  None.  FINDINGS: No acute intracranial hemorrhage. No focal mass lesion. No CT evidence of acute infarction. No midline shift or mass effect. No hydrocephalus. Basilar cisterns are patent. Minimal periventricular white matter hypodensity.  Mastoid air cells are clear. There is frothy material within the maxillary sinuses. Frontal sinuses are clear.  IMPRESSION: 1. No acute intracranial findings. 2. Maxillary sinusitis.   Electronically Signed   By: Suzy Bouchard M.D.   On: 05/27/2014 17:43   Dg Hand Complete Left  05/27/2014   CLINICAL DATA:  Pain and redness.  Swelling.  EXAM: LEFT HAND - COMPLETE 3+ VIEW  COMPARISON:  None.  FINDINGS: Mild first MCP joint osteoarthritis. Anatomic alignment. No fracture. Diffuse swelling of the hand. No gas in the soft tissues. No erosive changes of bone. Benign osteochondroma projects off the radial and volar aspect of the first metacarpal.  IMPRESSION: Diffuse soft tissue swelling of the hand. No acute osseous abnormality.   Electronically Signed   By: Dereck Ligas M.D.   On:  05/27/2014 17:37    CBC  Recent Labs Lab 05/27/14 1621 05/28/14 0845  WBC 18.6* 16.1*  HGB 13.0 12.0*  HCT 39.3 36.7*  PLT 329 325  MCV 94.2 93.9  MCH 31.2 30.7  MCHC 33.1 32.7  RDW 16.7* 16.3*  LYMPHSABS 1.3 2.5  MONOABS 1.1* 1.3*  EOSABS 0.4 0.1  BASOSABS 0.2* 0.0    Chemistries   Recent Labs Lab 05/27/14 1621 05/28/14 0845  NA 137 132*  K 4.7 4.4  CL 98 97  CO2 23 23  GLUCOSE 93 92  BUN 7 8  CREATININE 0.90 0.97  CALCIUM 9.3 8.3*  AST 43* 29  ALT 31 26  ALKPHOS 66 60  BILITOT <0.2* 0.3   ------------------------------------------------------------------------------------------------------------------ estimated creatinine clearance is 82.3 ml/min (by C-G formula based on Cr of 0.97). ------------------------------------------------------------------------------------------------------------------ No results found for this basename: HGBA1C,  in the last 72 hours ------------------------------------------------------------------------------------------------------------------ No results found for this basename: CHOL, HDL, LDLCALC, TRIG, CHOLHDL, LDLDIRECT,  in the last 72 hours ------------------------------------------------------------------------------------------------------------------ No results found for this basename: TSH, T4TOTAL, FREET3, T3FREE, THYROIDAB,  in the last 72 hours ------------------------------------------------------------------------------------------------------------------ No results found for this basename: VITAMINB12, FOLATE, FERRITIN, TIBC, IRON, RETICCTPCT,  in the last 72 hours  Coagulation profile  Recent Labs Lab 05/27/14 1700 05/28/14 0845  INR 1.81* 1.53*    No results found for this basename: DDIMER,  in the last 72 hours  Cardiac Enzymes  Recent Labs Lab 05/27/14 1700 05/27/14 2312  TROPONINI <0.30 <0.30    ------------------------------------------------------------------------------------------------------------------ No components found with this basename: POCBNP,      Time Spent in minutes   35  Thurnell Lose M.D on 05/28/2014 at 10:06 AM  Between 7am to 7pm - Pager - [phone]  After 7pm go to [email or web address] - password [email or web address]  And look for the night coverage person covering for me after hours  [facility] Office  [phone]   **Disclaimer: This note may have been dictated with voice recognition software. Similar sounding words can inadvertently be transcribed and this note may contain transcription errors which may not have been corrected upon publication of note.**

## 2014-05-28 NOTE — Anesthesia Procedure Notes (Signed)
Anesthesia Regional Block:  Supraclavicular block  Pre-Anesthetic Checklist: ,, timeout performed, Correct Patient, Correct Site, Correct Laterality, Correct Procedure, Correct Position, site marked, Risks and benefits discussed,  Surgical consent,  Pre-op evaluation,  At surgeon's request and post-op pain management  Laterality: Left  Prep: chloraprep       Needles:  Injection technique: Single-shot  Needle Type: Echogenic Stimulator Needle     Needle Length: 5cm 5 cm Needle Gauge: 22 and 22 G    Additional Needles:  Procedures: ultrasound guided (picture in chart) and nerve stimulator Supraclavicular block  Nerve Stimulator or Paresthesia:  Response: bicep contraction, 0.45 mA,   Additional Responses:   Narrative:  Start time: 05/28/2014 2:52 AM End time: 05/28/2014 3:02 AM Injection made incrementally with aspirations every 5 mL.  Performed by: Personally  Anesthesiologist: J. Tamela Gammon, MD  Additional Notes: Functioning IV was confirmed and monitors applied.  A 51mm 22ga echogenic arrow stimulator was used. Sterile prep and drape,hand hygiene and sterile gloves were used.Ultrasound guidance: relevant anatomy identified, needle position confirmed, local anesthetic spread visualized around nerve(s)., vascular puncture avoided.  Image printed for medical record.  Negative aspiration and negative test dose prior to incremental administration of local anesthetic. The patient tolerated the procedure well.

## 2014-05-28 NOTE — Progress Notes (Signed)
Pt arrived on unit, alert & oriented x4. In no acute distress. No SOB noted yet on continuous oxygen at 3LPM dependent. Vital signs taken and stable. C/o L hand pain, we will treat per MD order. RAC IV site, clean dry and intact. Skin intact as assessed, noted redness and swelling on L hand. Paged admitting doctor. Oriented to room and staff. Pt care guide provided. We will continue to monitor.

## 2014-05-28 NOTE — Consult Note (Signed)
Robert [name] is an 53 y.o. male.   Chief Complaint: left hand/wrist infection HPI: 53 yo male states he began to have swelling and pain of left wrist and hand starting 2 days ago.  Has become progressively worse.  He does not remember a specific wound or bite in the area.  States he does not inject himself.  Pain in left hand and wrist.  Seen at [facility] and transferred to [facility] and admitted by hospitalist service.  Past Medical History  Diagnosis Date  . Deep vein thrombosis     "several"  . Pulmonary emboli     "several"  . COPD (chronic obstructive pulmonary disease)   . Partial small bowel obstruction   . Myocardial infarction 2007; ~ 2011  . Chronic liver failure   . Shortness of breath     "all the time"  . Oxygen dependent     3L; 24/7" (05/27/2014)  . History of blood transfusion 2013    "related to kidneys shutting down"  . Hepatitis C   . Depression   . Hypertension   . Hypothyroidism   . Asthma   . Pneumonia     "several times"  . GERD (gastroesophageal reflux disease)   . Migraines     "2-3/day" (05/27/2014)  . Stroke ~ 03/2012    "couldn't use my left hand for ~ 6 months" (05/27/2014)  . Stroke 05/25/2014    "not able to use my left hand again" (05/27/2014)  . Arthritis     "I'm eat up w/it"  . Chronic lower back pain   . Anxiety   . Intermittent self-catheterization of bladder   . Liver cancer   . Metastatic lung cancer     "left"  . B12 deficiency     "give myself shots"    Past Surgical History  Procedure Laterality Date  . Transurethral resection of prostate  2012; 2014  . Tonsillectomy and adenoidectomy  1974  . Knee surgery Left ~ 1972    "cut top of my kneecap off"  . Multiple tooth extractions  11/2010    "26"    Family History  Problem Relation Age of Onset  . Cancer Mother   . Cancer Paternal Aunt   . Cancer Maternal Aunt    Social History:  reports that he has been smoking Cigarettes.  He has a 20.5 pack-year smoking history. He has  quit using smokeless tobacco. He reports that he drinks about 3.6 ounces of alcohol per week. He reports that he uses illicit drugs (Cocaine).  Allergies:  Allergies  Allergen Reactions  . Morphine And Related Itching and Rash    Medications Prior to Admission  Medication Sig Dispense Refill  . ALPRAZolam (XANAX) 1 MG tablet Take 1 mg by mouth 2 (two) times daily as needed for sleep.      . cyanocobalamin (,VITAMIN B-12,) 1000 MCG/ML injection Inject 1,000 mcg into the muscle once a week. On monday      . DULoxetine (CYMBALTA) 60 MG capsule Take 60 mg by mouth 2 (two) times daily.      . folic acid (FOLVITE) 1 MG tablet Take 1 mg by mouth daily.      Marland Kitchen gabapentin (NEURONTIN) 400 MG capsule Take 400 mg by mouth 3 (three) times daily.      Marland Kitchen levothyroxine (SYNTHROID, LEVOTHROID) 50 MCG tablet Take 50 mcg by mouth daily before breakfast.      . Multiple Vitamins-Minerals (MULTIVITAMIN WITH MINERALS) tablet Take 1 tablet by  mouth daily.      Marland Kitchen omeprazole (PRILOSEC) 20 MG capsule Take 20 mg by mouth 2 (two) times daily.      Marland Kitchen oxyCODONE (OXYCONTIN) 20 MG 12 hr tablet Take 1 tablet (20 mg total) by mouth every 12 (twelve) hours.  30 tablet  0  . oxycodone (ROXICODONE) 30 MG immediate release tablet Take 1 tablet every 4 hours as needed for pain. Contact your primary care physician on Monday, April 6 to arrange for your regular refill.  6 tablet  0  . potassium chloride SA (K-DUR,KLOR-CON) 20 MEQ tablet Take 20 mEq by mouth at bedtime.       . Tamsulosin HCl (FLOMAX) 0.4 MG CAPS Take 0.4 mg by mouth at bedtime.       Marland Kitchen warfarin (COUMADIN) 5 MG tablet Take 7.5 mg by mouth daily.      . [DISCONTINUED] ciprofloxacin (CIPRO) 500 MG tablet Take 1 tablet (500 mg total) by mouth 2 (two) times daily.  20 tablet  0  . [DISCONTINUED] OLANZapine (ZYPREXA) 5 MG tablet Take 5-10 mg by mouth at bedtime.      . [DISCONTINUED] oxycodone (ROXICODONE) 30 MG immediate release tablet Take 30 mg by mouth every 4 (four)  hours as needed for pain.      . [DISCONTINUED] pregabalin (LYRICA) 75 MG capsule Take 75 mg by mouth daily.        Results for orders placed during the hospital encounter of 05/27/14 (from the past 48 hour(s))  CBC WITH DIFFERENTIAL     Status: Abnormal   Collection Time    05/27/14  4:21 PM      Result Value Ref Range   WBC 18.6 (*) 4.0 - 10.5 K/uL   RBC 4.17 (*) 4.22 - 5.81 MIL/uL   Hemoglobin 13.0  13.0 - 17.0 g/dL   HCT 39.3  39.0 - 52.0 %   MCV 94.2  78.0 - 100.0 fL   MCH 31.2  26.0 - 34.0 pg   MCHC 33.1  30.0 - 36.0 g/dL   RDW 16.7 (*) 11.5 - 15.5 %   Platelets 329  150 - 400 K/uL   Neutrophils Relative % 84 (*) 43 - 77 %   Lymphocytes Relative 7 (*) 12 - 46 %   Monocytes Relative 6  3 - 12 %   Eosinophils Relative 2  0 - 5 %   Basophils Relative 1  0 - 1 %   Neutro Abs 15.6 (*) 1.7 - 7.7 K/uL   Lymphs Abs 1.3  0.7 - 4.0 K/uL   Monocytes Absolute 1.1 (*) 0.1 - 1.0 K/uL   Eosinophils Absolute 0.4  0.0 - 0.7 K/uL   Basophils Absolute 0.2 (*) 0.0 - 0.1 K/uL   RBC Morphology ELLIPTOCYTES     WBC Morphology ATYPICAL LYMPHOCYTES     Smear Review LARGE PLATELETS PRESENT    COMPREHENSIVE METABOLIC PANEL     Status: Abnormal   Collection Time    05/27/14  4:21 PM      Result Value Ref Range   Sodium 137  137 - 147 mEq/L   Potassium 4.7  3.7 - 5.3 mEq/L   Chloride 98  96 - 112 mEq/L   CO2 23  19 - 32 mEq/L   Glucose, Bld 93  70 - 99 mg/dL   BUN 7  6 - 23 mg/dL   Creatinine, Ser 0.90  0.50 - 1.35 mg/dL   Calcium 9.3  8.4 - 10.5 mg/dL   Total  Protein 6.9  6.0 - 8.3 g/dL   Albumin 2.8 (*) 3.5 - 5.2 g/dL   AST 43 (*) 0 - 37 U/L   Comment: SLIGHT HEMOLYSIS     HEMOLYSIS AT THIS LEVEL MAY AFFECT RESULT   ALT 31  0 - 53 U/L   Alkaline Phosphatase 66  39 - 117 U/L   Total Bilirubin <0.2 (*) 0.3 - 1.2 mg/dL   GFR calc non Af Amer >90  >90 mL/min   GFR calc Af Amer >90  >90 mL/min   Comment: (NOTE)     The eGFR has been calculated using the CKD EPI equation.     This  calculation has not been validated in all clinical situations.     eGFR's persistently <90 mL/min signify possible Chronic Kidney     Disease.   Anion gap 16 (*) 5 - 15  URINALYSIS, ROUTINE W REFLEX MICROSCOPIC     Status: Abnormal   Collection Time    05/27/14  4:52 PM      Result Value Ref Range   Color, Urine YELLOW  YELLOW   APPearance CLEAR  CLEAR   Specific Gravity, Urine 1.009  1.005 - 1.030   pH 6.0  5.0 - 8.0   Glucose, UA NEGATIVE  NEGATIVE mg/dL   Hgb urine dipstick NEGATIVE  NEGATIVE   Bilirubin Urine NEGATIVE  NEGATIVE   Ketones, ur NEGATIVE  NEGATIVE mg/dL   Protein, ur NEGATIVE  NEGATIVE mg/dL   Urobilinogen, UA 0.2  0.0 - 1.0 mg/dL   Nitrite POSITIVE (*) NEGATIVE   Leukocytes, UA TRACE (*) NEGATIVE  URINE MICROSCOPIC-ADD ON     Status: Abnormal   Collection Time    05/27/14  4:52 PM      Result Value Ref Range   Squamous Epithelial / LPF RARE  RARE   WBC, UA 0-2  <3 WBC/hpf   Bacteria, UA FEW (*) RARE  PROTIME-INR     Status: Abnormal   Collection Time    05/27/14  5:00 PM      Result Value Ref Range   Prothrombin Time 21.0 (*) 11.6 - 15.2 seconds   INR 1.81 (*) 0.00 - 1.49  TROPONIN I     Status: None   Collection Time    05/27/14  5:00 PM      Result Value Ref Range   Troponin I <0.30  <0.30 ng/mL   Comment:            Due to the release kinetics of cTnI,     a negative result within the first hours     of the onset of symptoms does not rule out     myocardial infarction with certainty.     If myocardial infarction is still suspected,     repeat the test at appropriate intervals.  CK     Status: None   Collection Time    05/27/14 11:12 PM      Result Value Ref Range   Total CK 48  7 - 232 U/L  TROPONIN I     Status: None   Collection Time    05/27/14 11:12 PM      Result Value Ref Range   Troponin I <0.30  <0.30 ng/mL   Comment:            Due to the release kinetics of cTnI,     a negative result within the first hours     of the onset of  symptoms  does not rule out     myocardial infarction with certainty.     If myocardial infarction is still suspected,     repeat the test at appropriate intervals.    Dg Chest 2 View  05/27/2014   CLINICAL DATA:  Difficulty breathing and pain  EXAM: CHEST  2 VIEW  COMPARISON:  Chest radiograph December 08, 2010 and chest CT May 30, 2012  FINDINGS: There is patchy bibasilar atelectatic change. Elsewhere lungs are clear. Heart size and pulmonary vascularity are normal. No adenopathy. No pneumothorax. No appreciable bone lesions.  IMPRESSION: Patchy bibasilar atelectatic change. Elsewhere lungs are clear. No demonstrable pneumothorax.   Electronically Signed   By: Lowella Grip M.D.   On: 05/27/2014 17:39   Ct Head Wo Contrast  05/27/2014   CLINICAL DATA:  Slurred speech  EXAM: CT HEAD WITHOUT CONTRAST  TECHNIQUE: Contiguous axial images were obtained from the base of the skull through the vertex without intravenous contrast.  COMPARISON:  None.  FINDINGS: No acute intracranial hemorrhage. No focal mass lesion. No CT evidence of acute infarction. No midline shift or mass effect. No hydrocephalus. Basilar cisterns are patent. Minimal periventricular white matter hypodensity.  Mastoid air cells are clear. There is frothy material within the maxillary sinuses. Frontal sinuses are clear.  IMPRESSION: 1. No acute intracranial findings. 2. Maxillary sinusitis.   Electronically Signed   By: Suzy Bouchard M.D.   On: 05/27/2014 17:43   Dg Hand Complete Left  05/27/2014   CLINICAL DATA:  Pain and redness.  Swelling.  EXAM: LEFT HAND - COMPLETE 3+ VIEW  COMPARISON:  None.  FINDINGS: Mild first MCP joint osteoarthritis. Anatomic alignment. No fracture. Diffuse swelling of the hand. No gas in the soft tissues. No erosive changes of bone. Benign osteochondroma projects off the radial and volar aspect of the first metacarpal.  IMPRESSION: Diffuse soft tissue swelling of the hand. No acute osseous abnormality.    Electronically Signed   By: Dereck Ligas M.D.   On: 05/27/2014 17:37     A comprehensive review of systems was negative.  Blood pressure 124/84, pulse 98, temperature 97.7 F (36.5 C), temperature source Oral, resp. rate 18, height '5\' 7"'  (1.702 m), weight 70.943 kg (156 lb 6.4 oz), SpO2 100.00%.  General appearance: alert, cooperative and appears stated age Head: Normocephalic, without obvious abnormality, atraumatic Neck: supple, symmetrical, trachea midline Extremities: intact sensation and capillary refill all digits.  +epl/fpl/io.  limited motion in left digits due to pain.  pain with motion of wrist, less with passive motion.  most tender on dorsum of hand.  mild streak up forearm.  hand and wrist indurated. Pulses: 2+ and symmetric Skin: Skin color, texture, turgor normal. No rashes or lesions Neurologic: Grossly normal Incision/Wound: none  Assessment/Plan Left hand/wrist abscess, possible septic wrist.  Recommend OR for incision and drainage possibly including wrist joint.  Non operative and operative treatment options were discussed with the patient and patient wishes to proceed with operative treatment.  Risks, benefits, and alternatives of surgery were discussed and the patient agrees with the plan of care.   Valli Randol R 05/28/2014, 2:10 AM

## 2014-05-28 NOTE — Progress Notes (Signed)
ANTICOAGULATION CONSULT NOTE - Follow Up Consult  Pharmacy Consult for coumadin Indication: hx DVT/PE  Allergies  Allergen Reactions  . Morphine And Related Itching and Rash    Patient Measurements: Height: 5\' 7"  (170.2 cm) Weight: 154 lb 8.7 oz (70.1 kg) IBW/kg (Calculated) : 66.1   Vital Signs: Temp: 98 F (36.7 C) (08/28 0555) Temp src: Oral (08/28 0555) BP: 105/70 mmHg (08/28 0555) Pulse Rate: 96 (08/28 0555)  Labs:  Recent Labs  05/27/14 1621 05/27/14 1700 05/27/14 2312  HGB 13.0  --   --   HCT 39.3  --   --   PLT 329  --   --   LABPROT  --  21.0*  --   INR  --  1.81*  --   CREATININE 0.90  --   --   CKTOTAL  --   --  48  TROPONINI  --  <0.30 <0.30    Estimated Creatinine Clearance: 88.7 ml/min (by C-G formula based on Cr of 0.9).  Assessment: Patient is a 53 y.o M on coumadin for hx DVT/PE.  Home regimen is 7.5mg  daily.  INR is sub-therapeutic at 1.53 today with 7.5mg  dose given last night.  Goal of Therapy:  INR 2-3    Plan:  1) coumadin 10 mg PO x1 today 2) no change for vanc and zosyn  Herrick Hartog P 05/28/2014,8:59 AM

## 2014-05-28 NOTE — Evaluation (Signed)
Occupational Therapy Evaluation Patient Details Name: Abdinasir Spadafore MRN: [id number] DOB: 21-May-1961 Today's Date: 05/28/2014    History of Present Illness Dominik Yordy is a 53 y.o. 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  He underwent I&D for abcess lt. wrist 05/28/14   Clinical Impression   Pt admitted with above. He demonstrates the below listed deficits and will benefit from continued OT to maximize safety and independence with BADLs.  At time of eval, pt lethargic, confused and with increased pain Lt. UE which all limited his participation in eval.  He currently, requires mod - max A for BADLs, unsure how much medication is impacting his performance. He was encouraged to perform AROM lt. UE;  He was unable to actively extend/flex digits on Lt hand. Unable to determine if this is due to pain or impaired AROM.  Will follow       Follow Up Recommendations  Outpatient OT;Supervision/Assistance - 24 hour    Equipment Recommendations       Recommendations for Other Services       Precautions / Restrictions Precautions Precautions: Fall Precaution Comments: H/o Rt. CVA and ETOH abuse      Mobility Bed Mobility Overal bed mobility: Needs Assistance Bed Mobility: Supine to Sit;Sit to Supine     Supine to sit: Min assist Sit to supine: Min guard   General bed mobility comments: Assist to raise trunk from bed  Transfers Overall transfer level: Needs assistance Equipment used: None Transfers: Sit to/from [facility] Sit to Stand: Min assist Stand pivot transfers: Min assist       General transfer comment: Pt unsteady. Loses balance    Balance Overall balance assessment: Needs assistance Sitting-balance support: Feet  supported Sitting balance-Leahy Scale: Poor       Standing balance-Leahy Scale: Poor                              ADL Overall ADL's : Needs assistance/impaired Eating/Feeding: Set up;Bed level   Grooming: Brushing hair;Minimal assistance;Sitting Grooming Details (indicate cue type and reason): pt self distracts and unable to complete task Upper Body Bathing: Moderate assistance;Sitting;Bed level   Lower Body Bathing: Moderate assistance;Sit to/from stand   Upper Body Dressing : Moderate assistance;Sitting   Lower Body Dressing: Maximal assistance;Sit to/from stand   Toilet Transfer: Stand-pivot;BSC;Minimal assistance   Toileting- Clothing Manipulation and Hygiene: Maximal assistance;Sit to/from stand       Functional mobility during ADLs: Moderate assistance General ADL Comments: Pt confused, restless and distracted by pain Lt. UE     Vision                     Perception     Praxis      Pertinent Vitals/Pain Pain Assessment: Faces Faces Pain Scale: Hurts whole lot Pain Location: Lt. hand  Pain Intervention(s): Monitored during session;Limited activity within patient's tolerance     Hand Dominance Right (pt states he is ambidexterous)   Extremity/Trunk Assessment Upper Extremity Assessment Upper Extremity Assessment: LUE deficits/detail LUE Deficits / Details: post surgical cast/splint on Lt. UE.  Pt unable to flex extend digits - unsure if due to pain or impaired AROM.  PROM performed [facility].  Elbow and shoulder WFL LUE: Unable to  fully assess due to immobilization   Lower Extremity Assessment Lower Extremity Assessment: Generalized weakness       Communication     Cognition Arousal/Alertness: Suspect due to medications Behavior During Therapy: Restless Overall Cognitive Status: No family/caregiver present to determine baseline cognitive functioning       Memory: Decreased short-term memory             General Comments        Exercises Exercises: Other exercises Other Exercises Other Exercises: Pt performed 5 reps shoulder flexion; elbow flex/ext actively and PROM of digits.  Participation limited by pain    Shoulder Instructions      Home Living Family/patient expects to be discharged to:: Private residence Living Arrangements: Spouse/significant other Available Help at Discharge: Family;Available 24 hours/day Type of Home: Apartment Home Access: Stairs to enter Entrance Stairs-Number of Steps: 20 Entrance Stairs-Rails: Right Home Layout: One level     Bathroom Shower/Tub: Teacher, early years/pre: Standard Bathroom Accessibility: Yes How Accessible: Accessible via walker Home Equipment: [place] - single point   Additional Comments: Home environment determined by chart review      Prior Functioning/Environment Level of Independence: Needs assistance  Gait / Transfers Assistance Needed: Unclear of PLOF due to pts decreased arousal and unsure of baseline cognition, however states "I"ve been getting help for a while" ADL's / Homemaking Assistance Needed: Pt states he wife helps him with "pretty much everything"  but when asked about specific tasks, he reports he is able to perform BADLs without assistance.  He states wife has neck problems and "is really bad off"        OT Diagnosis: Generalized weakness;Cognitive deficits;Acute pain   OT Problem List: Decreased strength;Decreased range of motion;Decreased activity tolerance;Impaired balance (sitting and/or standing);Decreased cognition;Decreased safety awareness;Decreased knowledge of use of DME or AE;Pain;Impaired UE functional use   OT Treatment/Interventions: Self-care/ADL training;Therapeutic exercise;DME and/or AE instruction;Therapeutic activities;Cognitive remediation/compensation;Patient/family education;Balance training    OT Goals(Current goals can be found in the care plan section) Acute Rehab OT Goals Patient Stated Goal: none  stated OT Goal Formulation: Patient unable to participate in goal setting Time For Goal Achievement: 06/11/14 Potential to Achieve Goals: Fair ADL Goals Pt Will Perform Grooming: with modified independence;standing Pt Will Perform Upper Body Bathing: with modified independence;sitting Pt Will Perform Lower Body Bathing: with modified independence;sit to/from stand Pt Will Perform Upper Body Dressing: with modified independence;sitting Pt Will Perform Lower Body Dressing: with modified independence;sit to/from stand Pt Will Transfer to Toilet: with modified independence;ambulating;regular height toilet Pt Will Perform Toileting - Clothing Manipulation and hygiene: with modified independence;sit to/from stand Pt/caregiver will Perform Home Exercise Program: Increased ROM;Left upper extremity;With Supervision  OT Frequency: Min 2X/week   Barriers to D/C:            Co-evaluation              End of Session Nurse Communication: Mobility status  Activity Tolerance: Patient limited by lethargy;Patient limited by pain Patient left: in bed;with call bell/phone within reach;with bed alarm set   Time: 8366-2947 OT Time Calculation (min): 13 min Charges:  OT General Charges $OT Visit: 1 Procedure OT Evaluation $Initial OT Evaluation Tier I: 1 Procedure OT Treatments $Therapeutic Activity: 8-22 mins G-Codes:    Donie Moulton M 2014/06/22, 7:40 PM

## 2014-05-28 NOTE — Anesthesia Preprocedure Evaluation (Addendum)
Anesthesia Evaluation  Patient identified by MRN, date of birth, ID band Patient awake    Reviewed: Allergy & Precautions, H&P , NPO status , Patient's Chart, lab work & pertinent test results  Airway Mallampati: II TM Distance: >3 FB Neck ROM: Full    Dental  (+) Edentulous Upper, Edentulous Lower   Pulmonary shortness of breath, asthma , pneumonia -, COPD oxygen dependent, Current Smoker,    Pulmonary exam normal       Cardiovascular hypertension, Pt. on medications + Past MI     Neuro/Psych PSYCHIATRIC DISORDERS Anxiety Depression CVA    GI/Hepatic GERD-  ,(+) Hepatitis -, C  Endo/Other  Hypothyroidism   Renal/GU negative Renal ROS     Musculoskeletal negative musculoskeletal ROS (+)   Abdominal   Peds  Hematology negative hematology ROS (+)   Anesthesia Other Findings   Reproductive/Obstetrics                        Anesthesia Physical Anesthesia Plan  ASA: IV and emergent  Anesthesia Plan: MAC and Regional   Post-op Pain Management:    Induction:   Airway Management Planned:   Additional Equipment:   Intra-op Plan:   Post-operative Plan: Possible Post-op intubation/ventilation  Informed Consent: I have reviewed the patients History and Physical, chart, labs and discussed the procedure including the risks, benefits and alternatives for the proposed anesthesia with the patient or authorized representative who has indicated his/her understanding and acceptance.     Plan Discussed with: CRNA, Anesthesiologist and Surgeon  Anesthesia Plan Comments:       Anesthesia Quick Evaluation

## 2014-05-28 NOTE — Progress Notes (Signed)
Utilization review completed. Brendalyn Vallely, RN, BSN. 

## 2014-05-29 DIAGNOSIS — R209 Unspecified disturbances of skin sensation: Secondary | ICD-10-CM

## 2014-05-29 LAB — CBC
HCT: 37.5 % — ABNORMAL LOW (ref 39.0–52.0)
Hemoglobin: 12.7 g/dL — ABNORMAL LOW (ref 13.0–17.0)
MCH: 30.6 pg (ref 26.0–34.0)
MCHC: 33.9 g/dL (ref 30.0–36.0)
MCV: 90.4 fL (ref 78.0–100.0)
PLATELETS: 346 10*3/uL (ref 150–400)
RBC: 4.15 MIL/uL — ABNORMAL LOW (ref 4.22–5.81)
RDW: 15.4 % (ref 11.5–15.5)
WBC: 16.2 10*3/uL — ABNORMAL HIGH (ref 4.0–10.5)

## 2014-05-29 LAB — BASIC METABOLIC PANEL
ANION GAP: 16 — AB (ref 5–15)
BUN: 7 mg/dL (ref 6–23)
CALCIUM: 8.8 mg/dL (ref 8.4–10.5)
CO2: 22 mEq/L (ref 19–32)
Chloride: 95 mEq/L — ABNORMAL LOW (ref 96–112)
Creatinine, Ser: 0.74 mg/dL (ref 0.50–1.35)
GFR calc Af Amer: >90 mL/min (ref >90)
GFR calc non Af Amer: >90 mL/min (ref >90)
Glucose, Bld: 91 mg/dL (ref 70–99)
Potassium: 4 mEq/L (ref 3.7–5.3)
Sodium: 133 mEq/L — ABNORMAL LOW (ref 137–147)

## 2014-05-29 LAB — GLUCOSE, CAPILLARY: Glucose-Capillary: 110 mg/dL — ABNORMAL HIGH (ref 70–99)

## 2014-05-29 LAB — PROTIME-INR
INR: 2.32 — ABNORMAL HIGH (ref 0.00–1.49)
Prothrombin Time: 25.5 seconds — ABNORMAL HIGH (ref 11.6–15.2)

## 2014-05-29 LAB — VANCOMYCIN, TROUGH: Vancomycin Tr: 7.9 ug/mL — ABNORMAL LOW (ref 10.0–20.0)

## 2014-05-29 MED ORDER — WARFARIN SODIUM 2.5 MG PO TABS
2.5000 mg | ORAL_TABLET | Freq: Once | ORAL | Status: AC
  Administered 2014-05-29: 2.5 mg via ORAL
  Filled 2014-05-29: qty 1

## 2014-05-29 MED ORDER — VANCOMYCIN HCL IN DEXTROSE 750-5 MG/150ML-% IV SOLN
750.0000 mg | Freq: Three times a day (TID) | INTRAVENOUS | Status: DC
  Administered 2014-05-29 – 2014-05-31 (×6): 750 mg via INTRAVENOUS
  Filled 2014-05-29 (×7): qty 150

## 2014-05-29 MED ORDER — CLONIDINE HCL 0.2 MG PO TABS
0.2000 mg | ORAL_TABLET | Freq: Three times a day (TID) | ORAL | Status: DC
  Administered 2014-05-29 (×2): 0.2 mg via ORAL
  Filled 2014-05-29 (×3): qty 1

## 2014-05-29 MED ORDER — CLONIDINE HCL 0.1 MG PO TABS
0.1000 mg | ORAL_TABLET | Freq: Two times a day (BID) | ORAL | Status: DC
  Filled 2014-05-29 (×2): qty 1

## 2014-05-29 NOTE — Progress Notes (Signed)
Subjective: Patient continues to complain of pain and paresthesias at the site of his cellulitis.  No longer has complaints of left leg or face numbness.     Objective: Current vital signs: BP 97/66  Pulse 86  Temp(Src) 98.3 F (36.8 C) (Oral)  Resp 16  Ht 5\' 7"  (1.702 m)  Wt 70.3 kg (154 lb 15.7 oz)  BMI 24.27 kg/m2  SpO2 100% Vital signs in last 24 hours: Temp:  [98.2 F (36.8 C)-98.5 F (36.9 C)] 98.3 F (36.8 C) (08/29 1246) Pulse Rate:  [86-97] 86 (08/29 1246) Resp:  [16-18] 16 (08/29 1246) BP: (97-152)/(66-100) 97/66 mmHg (08/29 1246) SpO2:  [95 %-100 %] 100 % (08/29 1246) Weight:  [70.3 kg (154 lb 15.7 oz)] 70.3 kg (154 lb 15.7 oz) (08/29 0606)  Intake/Output from previous day: 08/28 0701 - 08/29 0700 In: 2350 [I.V.:1550; IV Piggyback:800] Out: 053 [Urine:875] Intake/Output this shift: Total I/O In: 20 [P.O.:20] Out: -  Nutritional status: General  Neurologic Exam: Mental Status:  Alert, oriented, thought content appropriate. Speech fluent without evidence of aphasia. Able to follow 3 step commands without difficulty.  Cranial Nerves:  II: Discs flat bilaterally; Visual fields grossly normal, pupils equal, round, reactive to light and accommodation  III,IV, VI: ptosis not present, extra-ocular motions intact bilaterally  V,VII: smile symmetric, facial light touch sensation normal bilaterally  VIII: hearing normal bilaterally  IX,X: gag reflex present  XI: bilateral shoulder shrug  XII: midline tongue extension without atrophy or fasciculations  Motor:  5/5 throughout; no atrophy noted  Sensory: Unable to test on LUE, otherwise intact on the left Deep Tendon Reflexes:  1+ throughout Plantars:  Right: downgoing Left: downgoing   Lab Results: Basic Metabolic Panel:  Recent Labs Lab 05/27/14 1621 05/28/14 0845 05/29/14 0650  NA 137 132* 133*  K 4.7 4.4 4.0  CL 98 97 95*  CO2 23 23 22   GLUCOSE 93 92 91  BUN 7 8 7   CREATININE 0.90 0.97 0.74   CALCIUM 9.3 8.3* 8.8    Liver Function Tests:  Recent Labs Lab 05/27/14 1621 05/28/14 0845  AST 43* 29  ALT 31 26  ALKPHOS 66 60  BILITOT <0.2* 0.3  PROT 6.9 6.1  ALBUMIN 2.8* 2.6*   No results found for this basename: LIPASE, AMYLASE,  in the last 168 hours No results found for this basename: AMMONIA,  in the last 168 hours  CBC:  Recent Labs Lab 05/27/14 1621 05/28/14 0845 05/29/14 0650  WBC 18.6* 16.1* 16.2*  NEUTROABS 15.6* 12.3*  --   HGB 13.0 12.0* 12.7*  HCT 39.3 36.7* 37.5*  MCV 94.2 93.9 90.4  PLT 329 325 346    Cardiac Enzymes:  Recent Labs Lab 05/27/14 1700 05/27/14 2312  CKTOTAL  --  48  TROPONINI <0.30 <0.30    Lipid Panel: No results found for this basename: CHOL, TRIG, HDL, CHOLHDL, VLDL, LDLCALC,  in the last 168 hours  CBG:  Recent Labs Lab 05/29/14 [place] 110*    Microbiology: Results for orders placed during the hospital encounter of 05/27/14  URINE CULTURE     Status: None   Collection Time    05/27/14  4:52 PM      Result Value Ref Range Status   Specimen Description URINE, RANDOM   Final   Special Requests NONE   Final   Culture  Setup Time     Final   Value: 05/27/2014 22:47     Performed at [facility]  Colony Count     Final   Value: >=100,000 COLONIES/ML     Performed at [facility]   Culture     Final   Value: STAPHYLOCOCCUS SPECIES (COAGULASE NEGATIVE)     Note: RIFAMPIN AND GENTAMICIN SHOULD NOT BE USED AS SINGLE DRUGS FOR TREATMENT OF STAPH INFECTIONS.     Performed at [facility]   Report Status PENDING   Incomplete  SURGICAL PCR SCREEN     Status: Abnormal   Collection Time    05/28/14  1:31 AM      Result Value Ref Range Status   MRSA, PCR POSITIVE (*) NEGATIVE Final   Comment: RESULT CALLED TO, READ BACK BY AND VERIFIED WITH:     R YNOP,RN [id number] SKEENP/WILDERK   Staphylococcus aureus POSITIVE (*) NEGATIVE Final   Comment: RESULT CALLED TO, READ BACK BY AND  VERIFIED WITHMadie Reno [id number] SKEENP/WILDERK                The Xpert SA Assay (FDA     approved for NASAL specimens     in patients over 76 years of age),     is one component of     a comprehensive surveillance     program.  Test performance has     been validated by  [employer] for patients greater     than or equal to 49 year old.     It is not intended     to diagnose infection nor to     guide or monitor treatment.  ANAEROBIC CULTURE     Status: None   Collection Time    05/28/14  3:17 AM      Result Value Ref Range Status   Specimen Description ABSCESS LEFT HAND   Final   Special Requests NONE   Final   Gram Stain     Final   Value: ABUNDANT WBC PRESENT,BOTH PMN AND MONONUCLEAR     NO SQUAMOUS EPITHELIAL CELLS SEEN     RARE GRAM POSITIVE COCCI     IN PAIRS     Performed at [facility]   Culture     Final   Value: NO ANAEROBES ISOLATED; CULTURE IN PROGRESS FOR 5 DAYS     Performed at [facility]   Report Status PENDING   Incomplete  CULTURE, ROUTINE-ABSCESS     Status: None   Collection Time    05/28/14  3:17 AM      Result Value Ref Range Status   Specimen Description ABSCESS LEFT HAND   Final   Special Requests NONE   Final   Gram Stain     Final   Value: ABUNDANT WBC PRESENT,BOTH PMN AND MONONUCLEAR     NO SQUAMOUS EPITHELIAL CELLS SEEN     RARE GRAM POSITIVE COCCI     IN PAIRS     Performed at [facility]   Culture PENDING   Incomplete   Report Status PENDING   Incomplete  CULTURE, BLOOD (ROUTINE X 2)     Status: None   Collection Time    05/28/14  1:45 PM      Result Value Ref Range Status   Specimen Description BLOOD LEFT ARM   Final   Special Requests BOTTLES DRAWN AEROBIC ONLY 4CC   Final   Culture  Setup Time     Final   Value: 05/28/2014 19:16  Performed at [facility]     Final   Value:        BLOOD CULTURE RECEIVED NO GROWTH TO DATE CULTURE WILL BE HELD FOR 5 DAYS BEFORE ISSUING A  FINAL NEGATIVE REPORT     Performed at [facility]   Report Status PENDING   Incomplete  CULTURE, BLOOD (ROUTINE X 2)     Status: None   Collection Time    05/28/14  2:00 PM      Result Value Ref Range Status   Specimen Description BLOOD LEFT ARM   Final   Special Requests BOTTLES DRAWN AEROBIC ONLY 4CC   Final   Culture  Setup Time     Final   Value: 05/28/2014 19:17     Performed at [facility]   Culture     Final   Value:        BLOOD CULTURE RECEIVED NO GROWTH TO DATE CULTURE WILL BE HELD FOR 5 DAYS BEFORE ISSUING A FINAL NEGATIVE REPORT     Performed at [facility]   Report Status PENDING   Incomplete    Coagulation Studies:  Recent Labs  05/27/14 1700 05/28/14 0845 05/29/14 0650  LABPROT 21.0* 18.4* 25.5*  INR 1.81* 1.53* 2.32*    Imaging: Dg Chest 2 View  05/27/2014   CLINICAL DATA:  Difficulty breathing and pain  EXAM: CHEST  2 VIEW  COMPARISON:  Chest radiograph December 08, 2010 and chest CT May 30, 2012  FINDINGS: There is patchy bibasilar atelectatic change. Elsewhere lungs are clear. Heart size and pulmonary vascularity are normal. No adenopathy. No pneumothorax. No appreciable bone lesions.  IMPRESSION: Patchy bibasilar atelectatic change. Elsewhere lungs are clear. No demonstrable pneumothorax.   Electronically Signed   By: Lowella Grip M.D.   On: 05/27/2014 17:39   Ct Head Wo Contrast  05/27/2014   CLINICAL DATA:  Slurred speech  EXAM: CT HEAD WITHOUT CONTRAST  TECHNIQUE: Contiguous axial images were obtained from the base of the skull through the vertex without intravenous contrast.  COMPARISON:  None.  FINDINGS: No acute intracranial hemorrhage. No focal mass lesion. No CT evidence of acute infarction. No midline shift or mass effect. No hydrocephalus. Basilar cisterns are patent. Minimal periventricular white matter hypodensity.  Mastoid air cells are clear. There is frothy material within the maxillary sinuses. Frontal sinuses are  clear.  IMPRESSION: 1. No acute intracranial findings. 2. Maxillary sinusitis.   Electronically Signed   By: Suzy Bouchard M.D.   On: 05/27/2014 17:43   Mr Brain Wo Contrast  05/28/2014   CLINICAL DATA:  New onset of slurred speech and LEFT arm and leg numbness. Patient is anticoagulated for prior stroke. Also lung cancer. COPD. Arm cellulitis.  EXAM: MRI HEAD WITHOUT CONTRAST  TECHNIQUE: Multiplanar, multiecho pulse sequences of the brain and surrounding structures were obtained without intravenous contrast.  COMPARISON:  CT head 05/27/2014.  FINDINGS: The patient was unable to remain motionless for the exam. Small or subtle lesions could be overlooked.  No evidence for acute infarction, hemorrhage, mass lesion, hydrocephalus, or extra-axial fluid. Premature for age cerebral and cerebellar atrophy. Mild subcortical and periventricular T2 and FLAIR hyperintensities, likely chronic microvascular ischemic change. Flow voids are maintained throughout the carotid, basilar, and vertebral arteries. There are no areas of chronic hemorrhage. Pituitary, pineal, and cerebellar tonsils unremarkable. No upper cervical lesions. Slight marrow heterogeneity at C3-C5 could represent nonspecific osseous disease. This area is incompletely evaluated on brain  MR. negative orbits. Chronic sinus disease. Small BILATERAL mastoid effusions.  IMPRESSION: No evidence for acute stroke, intracranial hemorrhage, or intracranial mass lesion. Premature atrophy.  Slight heterogeneity of marrow signal from the C3-C5 vertebral bodies is incompletely evaluated on this motion degraded MR brain scan. If there is concern for cervical spine metastatic disease, or cervical radiculopathy or myelopathy accounting for LEFT arm and leg symptoms, consider MRI cervical spine for further evaluation.   Electronically Signed   By: Rolla Flatten M.D.   On: 05/28/2014 21:28   Dg Hand Complete Left  05/27/2014   CLINICAL DATA:  Pain and redness.  Swelling.   EXAM: LEFT HAND - COMPLETE 3+ VIEW  COMPARISON:  None.  FINDINGS: Mild first MCP joint osteoarthritis. Anatomic alignment. No fracture. Diffuse swelling of the hand. No gas in the soft tissues. No erosive changes of bone. Benign osteochondroma projects off the radial and volar aspect of the first metacarpal.  IMPRESSION: Diffuse soft tissue swelling of the hand. No acute osseous abnormality.   Electronically Signed   By: Dereck Ligas M.D.   On: 05/27/2014 17:37    Medications:  I have reviewed the patient's current medications. Scheduled: . antiseptic oral rinse  7 mL Mouth Rinse BID  . Chlorhexidine Gluconate Cloth  6 each Topical Q0600  . cloNIDine  0.2 mg Oral TID  . DULoxetine  60 mg Oral BID  . folic acid  1 mg Oral Daily  . gabapentin  400 mg Oral TID  . levothyroxine  50 mcg Oral QAC breakfast  . LORazepam  0-4 mg Intravenous Q6H   Followed by  . [START ON 05/30/2014] LORazepam  0-4 mg Intravenous Q12H  . multivitamin with minerals  1 tablet Oral Daily  . mupirocin ointment  1 application Nasal BID  . OxyCODONE  20 mg Oral Q12H  . pantoprazole  40 mg Oral Daily  . piperacillin-tazobactam (ZOSYN)  IV  3.375 g Intravenous Q8H  . polyethylene glycol  17 g Oral Daily  . sodium chloride  3 mL Intravenous Q12H  . tamsulosin  0.4 mg Oral QHS  . thiamine  100 mg Oral Daily  . vancomycin  1,000 mg Intravenous Q12H  . warfarin  2.5 mg Oral ONCE-1800  . Warfarin - Pharmacist Dosing Inpatient   Does not apply q1800    Assessment/Plan: Patient's symptoms improved and at this point seem all related to his cellulitis.  MRI of the brain reviewed and shows no acute infarct.  Patient on Coumadin.    Recommendations: 1.  Expect LUE symptoms to improve as cellulitis improves.  No further neurological testing recommended at this time.  Agree with continued anticoagulation.     LOS: 2 days   Alexis Goodell, MD [facility] [phone] 05/29/2014  4:55 PM

## 2014-05-29 NOTE — Progress Notes (Signed)
Patient Demographics  Robert [name], is a 53 y.o. male, DOB - July 26, 1961, [id number]  Admit date - 05/27/2014   Admitting Physician Alphia Moh, MD  Outpatient Primary MD for the patient is No PCP Per Patient  LOS - 2   Chief Complaint  Patient presents with  . Cellulitis        Subjective:   Robert [name] today has, No headache, No chest pain, No abdominal pain - No Nausea, No new weakness tingling or numbness, No Cough - SOB. He has some postop left arm pain. More awake and looks better today.  Assessment & Plan    1. Sepsis due to Cellulitis and abscess of the left hand, wrist and wrist joint. Status post incision and drainage by Dr. Fredna Dow on 05/27/2014, follow cultures, early blood culture is noted, we'll wait for complete wound and blood cultures, continue empiric IV antibiotics which are vancomycin along with Zosyn. Supportive care and gentle hydration.    2. 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 Not a candidate for chemoradiation all surgery per wife, he was recommended for hospice treatment but he woke hospice treatment few weeks ago, supportive care only. He currently wishes to be full code. Long-term prognosis poor.     3. 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 Pharmacy adjusting dose. We'll try to gently uptitrated in the light of recent surgery.     4. Chronic pain. Minimize narcotics and benzodiazepines as he is somewhat somnolent, as needed Narcan. Counseled not to overuse narcotics and sedative medications.     5. History of alcohol abuse. Counseled to quit, CIWA protocol.      6. Left arm paresthesias tingling numbness. Likely due to #1 above. Seen by neuro, CT head unremarkable. Await MRI.      Code Status: Full  Family Communication: Wife  bedside  Disposition Plan: Home   Procedures  CT Head, X ray L hand, his drainage of left hand, wrist and wrist joint.   Consults  Neuro, Hand surgery.   Medications  Scheduled Meds: . antiseptic oral rinse  7 mL Mouth Rinse BID  . Chlorhexidine Gluconate Cloth  6 each Topical Q0600  . cloNIDine  0.2 mg Oral TID  . DULoxetine  60 mg Oral BID  . folic acid  1 mg Oral Daily  . gabapentin  400 mg Oral TID  . levothyroxine  50 mcg Oral QAC breakfast  . LORazepam  0-4 mg Intravenous Q6H   Followed by  . [START ON 05/30/2014] LORazepam  0-4 mg Intravenous Q12H  . multivitamin with minerals  1 tablet Oral Daily  . mupirocin ointment  1 application Nasal BID  . OxyCODONE  20 mg Oral Q12H  . pantoprazole  40 mg Oral Daily  . piperacillin-tazobactam (ZOSYN)  IV  3.375 g Intravenous Q8H  . polyethylene glycol  17 g Oral Daily  . sodium chloride  3 mL Intravenous Q12H  . tamsulosin  0.4 mg Oral QHS  . thiamine  100 mg Oral Daily   Or  . thiamine  100 mg Intravenous Daily  . vancomycin  1,000 mg Intravenous Q12H  . Warfarin - Pharmacist Dosing Inpatient   Does not apply q1800   Continuous Infusions:   PRN Meds:.acetaminophen,  HYDROmorphone (DILAUDID) injection, LORazepam, LORazepam, naLOXone (NARCAN)  injection, ondansetron (ZOFRAN) IV, oxycodone, senna-docusate  DVT Prophylaxis  Coumadin  Lab Results  Component Value Date   PLT 346 05/29/2014    Antibiotics     Anti-infectives   Start     Dose/Rate Route Frequency Ordered Stop   05/28/14 0600  piperacillin-tazobactam (ZOSYN) IVPB 3.375 g     3.375 g 12.5 mL/hr over 240 Minutes Intravenous Every 8 hours 05/27/14 2318     05/28/14 0500  vancomycin (VANCOCIN) IVPB 1000 mg/200 mL premix     1,000 mg 200 mL/hr over 60 Minutes Intravenous Every 12 hours 05/27/14 2318     05/27/14 2330  piperacillin-tazobactam (ZOSYN) IVPB 3.375 g     3.375 g 100 mL/hr over 30 Minutes Intravenous  Once 05/27/14 2318 05/28/14 0142   05/27/14  1805  cefTRIAXone (ROCEPHIN) 1 G injection    Comments:  Malon Kindle   : cabinet override      05/27/14 1805 05/28/14 0614   05/27/14 1730  cefTRIAXone (ROCEPHIN) 1 g in dextrose 5 % 50 mL IVPB     1 g 100 mL/hr over 30 Minutes Intravenous  Once 05/27/14 1723 05/27/14 1853   05/27/14 1645  vancomycin (VANCOCIN) IVPB 1000 mg/200 mL premix     1,000 mg 200 mL/hr over 60 Minutes Intravenous  Once 05/27/14 1643 05/27/14 1809          Objective:   Filed Vitals:   05/28/14 1624 05/28/14 2156 05/29/14 0606 05/29/14 0944  BP:  150/100 152/100 127/87  Pulse:  96 92 97  Temp: 100.1 F (37.8 C) 98.5 F (36.9 C) 98.2 F (36.8 C)   TempSrc: Oral Oral Oral   Resp:  18 18   Height:      Weight:   70.3 kg (154 lb 15.7 oz)   SpO2:  97% 95%     Wt Readings from Last 3 Encounters:  05/29/14 70.3 kg (154 lb 15.7 oz)  05/29/14 70.3 kg (154 lb 15.7 oz)  01/22/14 66.679 kg (147 lb)     Intake/Output Summary (Last 24 hours) at 05/29/14 1021 Last data filed at 05/29/14 0913  Gross per 24 hour  Intake   2370 ml  Output    575 ml  Net   1795 ml     Physical Exam  Somnolent , Oriented X 3, No new F.N deficits, Normal affect [place].AT,PERRAL Supple Neck,No JVD, No cervical lymphadenopathy appriciated.  Symmetrical Chest wall movement, Good air movement bilaterally, CTAB RRR,No Gallops,Rubs or new Murmurs, No Parasternal Heave +ve B.Sounds, Abd Soft, No tenderness, No organomegaly appriciated, No rebound - guarding or rigidity. No Cyanosis, Clubbing or edema, No new Rash or bruise   L arm in splint   Data Review   Micro Results Recent Results (from the past 240 hour(s))  URINE CULTURE     Status: None   Collection Time    05/27/14  4:52 PM      Result Value Ref Range Status   Specimen Description URINE, RANDOM   Final   Special Requests NONE   Final   Culture  Setup Time     Final   Value: 05/27/2014 22:47     Performed at [facility]     Final    Value: >=100,000 COLONIES/ML     Performed at [facility]   Culture     Final   Value: STAPHYLOCOCCUS SPECIES (COAGULASE NEGATIVE)  Note: RIFAMPIN AND GENTAMICIN SHOULD NOT BE USED AS SINGLE DRUGS FOR TREATMENT OF STAPH INFECTIONS.     Performed at [facility]   Report Status PENDING   Incomplete  SURGICAL PCR SCREEN     Status: Abnormal   Collection Time    05/28/14  1:31 AM      Result Value Ref Range Status   MRSA, PCR POSITIVE (*) NEGATIVE Final   Comment: RESULT CALLED TO, READ BACK BY AND VERIFIED WITH:     R YNOP,RN [phone] SKEENP/WILDERK   Staphylococcus aureus POSITIVE (*) NEGATIVE Final   Comment: RESULT CALLED TO, READ BACK BY AND VERIFIED WITHMadie Reno [id number] SKEENP/WILDERK                The Xpert SA Assay (FDA     approved for NASAL specimens     in patients over 50 years of age),     is one component of     a comprehensive surveillance     program.  Test performance has     been validated by [employer] for patients greater     than or equal to 69 year old.     It is not intended     to diagnose infection nor to     guide or monitor treatment.  ANAEROBIC CULTURE     Status: None   Collection Time    05/28/14  3:17 AM      Result Value Ref Range Status   Specimen Description ABSCESS LEFT HAND   Final   Special Requests NONE   Final   Gram Stain     Final   Value: ABUNDANT WBC PRESENT,BOTH PMN AND MONONUCLEAR     NO SQUAMOUS EPITHELIAL CELLS SEEN     RARE GRAM POSITIVE COCCI     IN PAIRS     Performed at [facility]   Culture     Final   Value: NO ANAEROBES ISOLATED; CULTURE IN PROGRESS FOR 5 DAYS     Performed at [facility]   Report Status PENDING   Incomplete  CULTURE, ROUTINE-ABSCESS     Status: None   Collection Time    05/28/14  3:17 AM      Result Value Ref Range Status   Specimen Description ABSCESS LEFT HAND   Final   Special Requests NONE   Final   Gram Stain     Final   Value:  ABUNDANT WBC PRESENT,BOTH PMN AND MONONUCLEAR     NO SQUAMOUS EPITHELIAL CELLS SEEN     RARE GRAM POSITIVE COCCI     IN PAIRS     Performed at [facility]   Culture PENDING   Incomplete   Report Status PENDING   Incomplete    Radiology Reports Dg Chest 2 View  05/27/2014   CLINICAL DATA:  Difficulty breathing and pain  EXAM: CHEST  2 VIEW  COMPARISON:  Chest radiograph December 08, 2010 and chest CT May 30, 2012  FINDINGS: There is patchy bibasilar atelectatic change. Elsewhere lungs are clear. Heart size and pulmonary vascularity are normal. No adenopathy. No pneumothorax. No appreciable bone lesions.  IMPRESSION: Patchy bibasilar atelectatic change. Elsewhere lungs are clear. No demonstrable pneumothorax.   Electronically Signed   By: Lowella Grip M.D.   On: 05/27/2014 17:39   Ct Head Wo Contrast  05/27/2014   CLINICAL DATA:  Slurred speech  EXAM: CT  HEAD WITHOUT CONTRAST  TECHNIQUE: Contiguous axial images were obtained from the base of the skull through the vertex without intravenous contrast.  COMPARISON:  None.  FINDINGS: No acute intracranial hemorrhage. No focal mass lesion. No CT evidence of acute infarction. No midline shift or mass effect. No hydrocephalus. Basilar cisterns are patent. Minimal periventricular white matter hypodensity.  Mastoid air cells are clear. There is frothy material within the maxillary sinuses. Frontal sinuses are clear.  IMPRESSION: 1. No acute intracranial findings. 2. Maxillary sinusitis.   Electronically Signed   By: Suzy Bouchard M.D.   On: 05/27/2014 17:43   Mr Brain Wo Contrast  05/28/2014   CLINICAL DATA:  New onset of slurred speech and LEFT arm and leg numbness. Patient is anticoagulated for prior stroke. Also lung cancer. COPD. Arm cellulitis.  EXAM: MRI HEAD WITHOUT CONTRAST  TECHNIQUE: Multiplanar, multiecho pulse sequences of the brain and surrounding structures were obtained without intravenous contrast.  COMPARISON:  CT head  05/27/2014.  FINDINGS: The patient was unable to remain motionless for the exam. Small or subtle lesions could be overlooked.  No evidence for acute infarction, hemorrhage, mass lesion, hydrocephalus, or extra-axial fluid. Premature for age cerebral and cerebellar atrophy. Mild subcortical and periventricular T2 and FLAIR hyperintensities, likely chronic microvascular ischemic change. Flow voids are maintained throughout the carotid, basilar, and vertebral arteries. There are no areas of chronic hemorrhage. Pituitary, pineal, and cerebellar tonsils unremarkable. No upper cervical lesions. Slight marrow heterogeneity at C3-C5 could represent nonspecific osseous disease. This area is incompletely evaluated on brain MR. negative orbits. Chronic sinus disease. Small BILATERAL mastoid effusions.  IMPRESSION: No evidence for acute stroke, intracranial hemorrhage, or intracranial mass lesion. Premature atrophy.  Slight heterogeneity of marrow signal from the C3-C5 vertebral bodies is incompletely evaluated on this motion degraded MR brain scan. If there is concern for cervical spine metastatic disease, or cervical radiculopathy or myelopathy accounting for LEFT arm and leg symptoms, consider MRI cervical spine for further evaluation.   Electronically Signed   By: Rolla Flatten M.D.   On: 05/28/2014 21:28   Dg Hand Complete Left  05/27/2014   CLINICAL DATA:  Pain and redness.  Swelling.  EXAM: LEFT HAND - COMPLETE 3+ VIEW  COMPARISON:  None.  FINDINGS: Mild first MCP joint osteoarthritis. Anatomic alignment. No fracture. Diffuse swelling of the hand. No gas in the soft tissues. No erosive changes of bone. Benign osteochondroma projects off the radial and volar aspect of the first metacarpal.  IMPRESSION: Diffuse soft tissue swelling of the hand. No acute osseous abnormality.   Electronically Signed   By: Dereck Ligas M.D.   On: 05/27/2014 17:37    CBC  Recent Labs Lab 05/27/14 1621 05/28/14 0845 05/29/14 0650    WBC 18.6* 16.1* 16.2*  HGB 13.0 12.0* 12.7*  HCT 39.3 36.7* 37.5*  PLT 329 325 346  MCV 94.2 93.9 90.4  MCH 31.2 30.7 30.6  MCHC 33.1 32.7 33.9  RDW 16.7* 16.3* 15.4  LYMPHSABS 1.3 2.5  --   MONOABS 1.1* 1.3*  --   EOSABS 0.4 0.1  --   BASOSABS 0.2* 0.0  --     Chemistries   Recent Labs Lab 05/27/14 1621 05/28/14 0845 05/29/14 0650  NA 137 132* 133*  K 4.7 4.4 4.0  CL 98 97 95*  CO2 23 23 22   GLUCOSE 93 92 91  BUN 7 8 7   CREATININE 0.90 0.97 0.74  CALCIUM 9.3 8.3* 8.8  AST 43* 29  --  ALT 31 26  --   ALKPHOS 66 60  --   BILITOT <0.2* 0.3  --    ------------------------------------------------------------------------------------------------------------------ estimated creatinine clearance is 99.8 ml/min (by C-G formula based on Cr of 0.74). ------------------------------------------------------------------------------------------------------------------ No results found for this basename: HGBA1C,  in the last 72 hours ------------------------------------------------------------------------------------------------------------------ No results found for this basename: CHOL, HDL, LDLCALC, TRIG, CHOLHDL, LDLDIRECT,  in the last 72 hours ------------------------------------------------------------------------------------------------------------------ No results found for this basename: TSH, T4TOTAL, FREET3, T3FREE, THYROIDAB,  in the last 72 hours ------------------------------------------------------------------------------------------------------------------ No results found for this basename: VITAMINB12, FOLATE, FERRITIN, TIBC, IRON, RETICCTPCT,  in the last 72 hours  Coagulation profile  Recent Labs Lab 05/27/14 1700 05/28/14 0845 05/29/14 0650  INR 1.81* 1.53* 2.32*    No results found for this basename: DDIMER,  in the last 72 hours  Cardiac Enzymes  Recent Labs Lab 05/27/14 1700 05/27/14 2312  TROPONINI <0.30 <0.30    ------------------------------------------------------------------------------------------------------------------ No components found with this basename: POCBNP,      Time Spent in minutes   35   SINGH,PRASHANT K M.D on 05/29/2014 at 10:21 AM  Between 7am to 7pm - Pager - [phone]  After 7pm go to [email or web address] - password [email or web address]  And look for the night coverage person covering for me after hours  [facility] Office  [phone]   **Disclaimer: This note may have been dictated with voice recognition software. Similar sounding words can inadvertently be transcribed and this note may contain transcription errors which may not have been corrected upon publication of note.**

## 2014-05-29 NOTE — Progress Notes (Addendum)
ANTICOAGULATION AND ANTIBIOTIC CONSULT NOTE - Follow Up Consult  Pharmacy Consult for coumadin and vancomycin Indication: h/o DVT/PE and cellulitis/abscess, UTI  Allergies  Allergen Reactions  . Penicillins Itching  . Morphine And Related Itching and Rash    Patient Measurements: Height: 5\' 7"  (170.2 cm) Weight: 154 lb 15.7 oz (70.3 kg) IBW/kg (Calculated) : 66.1  Vital Signs: Temp: 98.2 F (36.8 C) (08/29 0606) Temp src: Oral (08/29 0606) BP: 127/87 mmHg (08/29 0944) Pulse Rate: 97 (08/29 0944)  Recent Labs  05/27/14 1621 05/27/14 1700 05/27/14 2312 05/28/14 0845 05/29/14 0650  HGB 13.0  --   --  12.0* 12.7*  HCT 39.3  --   --  36.7* 37.5*  PLT 329  --   --  325 346  LABPROT  --  21.0*  --  18.4* 25.5*  INR  --  1.81*  --  1.53* 2.32*  CREATININE 0.90  --   --  0.97 0.74  CKTOTAL  --   --  48  --   --   TROPONINI  --  <0.30 <0.30  --   --    Estimated Creatinine Clearance: 99.8 ml/min (by C-G formula based on Cr of 0.74).  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 INR subtherapeutic upon admission, up to 2.32 today from 1.53 yesterday after x1 dose increase of 10mg  yesterday, INR sensitivity possibly d/t poor PO intake currently.  No bleeding noted.  Pt now on day 3 of vanc and zosyn for cellulitis and abscess. UA positive >100K coag negative staph, blood cultures x2 pending. WBC remains elevated at 16.2, now afebrile, but Tm 103.  Goal of Therapy:  INR 2-3; Vanc trough 15-20 (until bacteremia ruled out) Monitor platelets by anticoagulation protocol: Yes   Plan:  1) coumadin 2.5 mg PO x1 today 2) Vanc trough at 1630 prior to 4th dose scheduled at 1700 (goal 15-20 until r/o bacteremia) 3) F/U INR, CBC, sx of bleeding 4) Continue vanc and zosyn today 5) F/U C&S - narrow abx as appropriate  Drucie Opitz, PharmD Clinical Pharmacy Resident Pager: [phone] 05/29/2014 11:41 AM    ======================================   Addendum: - VT =  7.9 mcg/mL on 1gm q12, SCr 0.74 - renal fxn improving  8/27 vanc>> 8/27 zosyn>>  8/28 MRSA PCR>>positive 8/27 ucx>> >100K coag neg staph 8/28 BCX x2>> pending   Goal of Therapy: Vanc trough:  15-20 mcg/mL until bacteremia ruled out    Plan: - Change vanc to 750mg  IV Q8H - Continue Zosyn 3.375gm IV Q8H, 4 hr infusion - Monitor renal fxn, clinical course, VT at new Css    Rodarius Kichline D. Mina Marble, PharmD, BCPS Pager:  [phone] 05/29/2014, 5:46 PM

## 2014-05-29 NOTE — Progress Notes (Signed)
Occupational Therapy Treatment Patient Details Name: Robert [name] MRN: [id number] DOB: Aug 09, 1961 Today's Date: 05/29/2014    History of present illness Robert [name] is a 53 y.o. 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  He underwent I&D for abcess lt. wrist 05/28/14   OT comments  Pt declined getting OOB to chair for ADL. Completed L hand A/AA/PROM as tolerated and allowed by current splint. Discussed discharge plan and patient states he wants to go home with hospice. If pt resumes hospice care, recommend home health therapist to work with patient as needed to increase functional use of L hand.   Follow Up Recommendations  Home health;Supervision/Assistance - 24 hour    Equipment Recommendations  None recommended by OT    Recommendations for Other Services      Precautions / Restrictions Precautions Precautions: Fall Precaution Comments: H/o Rt. CVA and ETOH abuse                                          Cognition   Behavior During Therapy: WFL for tasks assessed/performed Overall Cognitive Status: No family/caregiver present to determine baseline cognitive functioning                       Extremity/Trunk Assessment               Exercises Other Exercises Other Exercises: LUEAROmWFL withtheexceptionofLwrist/hand Other Exercises: Lwrist immobilized with plaster splint. Completed Ldigit A/AA/PROM into flex/extension as tolerated Other Exercises: educated on importance of elevation to reduce pain and edema Pt able to demonstrate L hand self ROM with min vc   Shoulder Instructions       General Comments      Pertinent Vitals/ Pain       Pain Assessment: 0-10 Pain Score: 3  Pain Location: Lhand Pain Descriptors /  Indicators: Aching Pain Intervention(s): Limited activity within patient's tolerance;Monitored during session;Repositioned  Home Living                                          Prior Functioning/Environment              Frequency Min 2X/week     Progress Toward Goals  OT Goals(current goals can now be found in the care plan section)  Progress towards OT goals: Progressing toward goals  Acute Rehab OT Goals Patient Stated Goal: none stated OT Goal Formulation: Patient unable to participate in goal setting Time For Goal Achievement: 06/11/14 Potential to Achieve Goals: Fair ADL Goals Pt Will Perform Grooming: with modified independence;standing Pt Will Perform Upper Body Bathing: with modified independence;sitting Pt Will Perform Lower Body Bathing: with modified independence;sit to/from stand Pt Will Perform Upper Body Dressing: with modified independence;sitting Pt Will Perform Lower Body Dressing: with modified independence;sit to/from stand Pt Will Transfer to Toilet: with modified independence;ambulating;regular height toilet Pt Will Perform Toileting - Clothing Manipulation and hygiene: with modified independence;sit to/from stand Pt/caregiver will Perform Home Exercise Program: Increased ROM;Left upper extremity;With Supervision  Plan Discharge plan remains appropriate    Co-evaluation  End of Session     Activity Tolerance Patient tolerated treatment well   Patient Left in bed;with call bell/phone within reach   Nurse Communication Mobility status        Time: 3888-7579 OT Time Calculation (min): 16 min  Charges: OT General Charges $OT Visit: 1 Procedure OT Treatments $Therapeutic Activity: 8-22 mins  Robert [name],Robert [name] 05/29/2014, 4:26 PM [facility], OTR/L  [phone] 05/29/2014

## 2014-05-30 LAB — CULTURE, ROUTINE-ABSCESS

## 2014-05-30 LAB — CBC
HEMATOCRIT: 35.3 % — AB (ref 39.0–52.0)
HEMOGLOBIN: 11.6 g/dL — AB (ref 13.0–17.0)
MCH: 30.4 pg (ref 26.0–34.0)
MCHC: 32.9 g/dL (ref 30.0–36.0)
MCV: 92.4 fL (ref 78.0–100.0)
Platelets: 341 10*3/uL (ref 150–400)
RBC: 3.82 MIL/uL — AB (ref 4.22–5.81)
RDW: 15.4 % (ref 11.5–15.5)
WBC: 10.8 10*3/uL — ABNORMAL HIGH (ref 4.0–10.5)

## 2014-05-30 LAB — URINE CULTURE: Colony Count: >=100000

## 2014-05-30 LAB — PROTIME-INR
INR: 2.59 — AB (ref 0.00–1.49)
Prothrombin Time: 27.8 seconds — ABNORMAL HIGH (ref 11.6–15.2)

## 2014-05-30 MED ORDER — HYDROMORPHONE HCL PF 1 MG/ML IJ SOLN: 0.5000 mg | INTRAMUSCULAR | Status: DC | PRN

## 2014-05-30 MED ORDER — SODIUM CHLORIDE 0.9 % IV BOLUS (SEPSIS)
500.0000 mL | Freq: Once | INTRAVENOUS | Status: AC
  Administered 2014-05-30: 500 mL via INTRAVENOUS

## 2014-05-30 MED ORDER — CLONIDINE HCL 0.1 MG PO TABS
0.1000 mg | ORAL_TABLET | Freq: Two times a day (BID) | ORAL | Status: DC | PRN
  Filled 2014-05-30: qty 1

## 2014-05-30 MED ORDER — OXYCODONE HCL 5 MG PO TABS: 10.0000 mg | ORAL_TABLET | Freq: Four times a day (QID) | ORAL | Status: DC | PRN

## 2014-05-30 MED ORDER — OXYCODONE HCL 5 MG PO TABS
5.0000 mg | ORAL_TABLET | Freq: Four times a day (QID) | ORAL | Status: DC | PRN
  Administered 2014-05-30: 5 mg via ORAL
  Filled 2014-05-30: qty 1

## 2014-05-30 MED ORDER — SODIUM CHLORIDE 0.9 % IJ SOLN
10.0000 mL | INTRAMUSCULAR | Status: DC | PRN
  Administered 2014-05-31: 10 mL

## 2014-05-30 MED ORDER — HYDROMORPHONE HCL PF 1 MG/ML IJ SOLN
1.0000 mg | Freq: Once | INTRAMUSCULAR | Status: AC
  Administered 2014-05-30: 1 mg via INTRAVENOUS
  Filled 2014-05-30: qty 1

## 2014-05-30 MED ORDER — OXYCODONE HCL 5 MG PO TABS
10.0000 mg | ORAL_TABLET | ORAL | Status: DC | PRN
  Administered 2014-05-30 – 2014-05-31 (×3): 10 mg via ORAL
  Filled 2014-05-30 (×3): qty 2

## 2014-05-30 MED ORDER — WARFARIN SODIUM 2.5 MG PO TABS
2.5000 mg | ORAL_TABLET | Freq: Once | ORAL | Status: AC
  Administered 2014-05-30: 2.5 mg via ORAL
  Filled 2014-05-30: qty 1

## 2014-05-30 NOTE — Progress Notes (Signed)
Peripherally Inserted Central Catheter/Midline Placement  The IV Nurse has discussed with the patient and/or persons authorized to consent for the patient, the purpose of this procedure and the potential benefits and risks involved with this procedure.  The benefits include less needle sticks, lab draws from the catheter and patient may be discharged home with the catheter.  Risks include, but not limited to, infection, bleeding, blood clot (thrombus formation), and puncture of an artery; nerve damage and irregular heat beat.  Alternatives to this procedure were also discussed.  PICC/Midline Placement Documentation  PICC / Midline Single Lumen 05/30/14 PICC Right Brachial 38 cm 0 cm (Active)  Indication for Insertion or Continuance of Line Limited venous access - need for IV therapy >5 days (PICC only);Poor Vasculature-patient has had multiple peripheral attempts or PIVs lasting less than 24 hours 05/30/2014  3:36 PM  Exposed Catheter (cm) 0 cm 05/30/2014  3:36 PM  Site Assessment Clean;Dry;Intact 05/30/2014  3:36 PM  Line Status Flushed;Saline locked;Blood return noted 05/30/2014  3:36 PM  Dressing Type Transparent 05/30/2014  3:36 PM  Dressing Status Clean;Dry;Intact;Antimicrobial disc in place 05/30/2014  3:36 PM  Line Care Connections checked and tightened 05/30/2014  3:36 PM  Line Adjustment (NICU/IV Team Only) No 05/30/2014  3:36 PM  Dressing Intervention New dressing 05/30/2014  3:36 PM  Dressing Change Due 06/06/14 05/30/2014  3:36 PM       Rolena Infante 05/30/2014, 3:37 PM

## 2014-05-30 NOTE — Progress Notes (Signed)
ANTICOAGULATION CONSULT NOTE - Follow Up Consult  Pharmacy Consult for Coumadin Indication: h/o DVT/PE  Allergies  Allergen Reactions  . Penicillins Itching  . Morphine And Related Itching and Rash    Patient Measurements: Height: 5\' 7"  (170.2 cm) Weight: 156 lb 1.4 oz (70.8 kg) IBW/kg (Calculated) : 66.1  Vital Signs: Temp: 98.3 F (36.8 C) (08/30 0555) Temp src: Oral (08/30 0555) BP: 90/60 mmHg (08/30 1120) Pulse Rate: 86 (08/30 1120)   Recent Labs  05/27/14 1621  05/27/14 1700 05/27/14 2312 05/28/14 0845 05/29/14 0650 05/30/14 0515  HGB 13.0  --   --   --  12.0* 12.7* 11.6*  HCT 39.3  --   --   --  36.7* 37.5* 35.3*  PLT 329  --   --   --  325 346 341  LABPROT  --   < > 21.0*  --  18.4* 25.5* 27.8*  INR  --   < > 1.81*  --  1.53* 2.32* 2.59*  CREATININE 0.90  --   --   --  0.97 0.74  --   CKTOTAL  --   --   --  48  --   --   --   TROPONINI  --   --  <0.30 <0.30  --   --   --   < > = values in this interval not displayed.  Estimated Creatinine Clearance: 99.8 ml/min (by C-G formula based on Cr of 0.74).  Assessment: Warf Rx for DVT, PE; cbc ok; INR remains therapeutic at 2.59 (up from 2.32 yesterday), poor PO intake may be contributing to INR sensitivity, no bleeding noted. Home dose: 7.5mg  daily (verified with patient and wife)   Goal of Therapy:  INR 2-3 Monitor platelets by anticoagulation protocol: Yes   Plan:  1) Coumadin 2.5mg  PO x1 today 2) Monitor INR, changes in PO intake, s/sx of bleeding 3) No change in Vanc or Zosyn  Drucie Opitz, PharmD Clinical Pharmacy Resident Pager: [phone] 05/30/2014 1:25 PM

## 2014-05-30 NOTE — Progress Notes (Signed)
Patient Demographics  Robert [name], is a 53 y.o. male, DOB - 1961/06/22, WOE:[id number]  Admit date - 05/27/2014   Admitting Physician Alphia Moh, MD  Outpatient Primary MD for the patient is No PCP Per Patient  LOS - 3   Chief Complaint  Patient presents with  . Cellulitis        Subjective:   Robert [name] today has, No headache, No chest pain, No abdominal pain - No Nausea, No new weakness tingling or numbness, No Cough - SOB. He has some postop left arm pain. More awake and looks better today.  Assessment & Plan    1. Sepsis due to Cellulitis and abscess of the left hand, wrist and wrist joint. Status post incision and drainage by Dr. Fredna Dow on 05/27/2014, follow cultures, urine culture likely contaminant UA was not impressive to begin with, we'll wait for anal wound and blood cultures, continue empiric IV antibiotics which are vancomycin along with Zosyn. Supportive care and gentle hydration.   We'll await hand surgery input again on Monday, likely can be placed on Bactrim and discharged home if okay by hand surgery on Monday with home health nursing.    2. 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 Not a candidate for chemoradiation all surgery per wife, he was recommended for hospice treatment but he woke hospice treatment few weeks ago, supportive care only. He currently wishes to be full code. Long-term prognosis poor.     3. 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 Pharmacy adjusting dose.      4. Chronic pain. Minimize narcotics and benzodiazepines as he is somewhat somnolent, as needed Narcan. Counseled not to overuse narcotics and sedative medications.     5. History of alcohol abuse. Counseled to quit, CIWA protocol.      6. Left arm paresthesias tingling  numbness. Likely due to #1 above. Seen by neuro, CT head unremarkable. -ve MRI.      Code Status: Full  Family Communication: Wife bedside  Disposition Plan: Home   Procedures  CT Head, X ray L hand, his drainage of left hand, wrist and wrist joint. MRI brain   Consults  Neuro, Hand surgery.   Medications  Scheduled Meds: . antiseptic oral rinse  7 mL Mouth Rinse BID  . Chlorhexidine Gluconate Cloth  6 each Topical Q0600  . cloNIDine  0.1 mg Oral BID  . DULoxetine  60 mg Oral BID  . folic acid  1 mg Oral Daily  . gabapentin  400 mg Oral TID  . levothyroxine  50 mcg Oral QAC breakfast  . LORazepam  0-4 mg Intravenous Q6H   Followed by  . LORazepam  0-4 mg Intravenous Q12H  . multivitamin with minerals  1 tablet Oral Daily  . mupirocin ointment  1 application Nasal BID  . OxyCODONE  20 mg Oral Q12H  . pantoprazole  40 mg Oral Daily  . piperacillin-tazobactam (ZOSYN)  IV  3.375 g Intravenous Q8H  . polyethylene glycol  17 g Oral Daily  . sodium chloride  3 mL Intravenous Q12H  . tamsulosin  0.4 mg Oral QHS  . thiamine  100 mg Oral Daily  . vancomycin  750 mg Intravenous Q8H  . Warfarin - Pharmacist Dosing Inpatient  Does not apply q1800   Continuous Infusions:   PRN Meds:.acetaminophen, HYDROmorphone (DILAUDID) injection, LORazepam, LORazepam, naLOXone (NARCAN)  injection, ondansetron (ZOFRAN) IV, oxycodone, senna-docusate  DVT Prophylaxis  Coumadin  Lab Results  Component Value Date   PLT 341 05/30/2014    Antibiotics     Anti-infectives   Start     Dose/Rate Route Frequency Ordered Stop   05/29/14 1800  vancomycin (VANCOCIN) IVPB 750 mg/150 ml premix     750 mg 150 mL/hr over 60 Minutes Intravenous Every 8 hours 05/29/14 1748     05/28/14 0600  piperacillin-tazobactam (ZOSYN) IVPB 3.375 g     3.375 g 12.5 mL/hr over 240 Minutes Intravenous Every 8 hours 05/27/14 2318     05/28/14 0500  vancomycin (VANCOCIN) IVPB 1000 mg/200 mL premix  Status:   Discontinued     1,000 mg 200 mL/hr over 60 Minutes Intravenous Every 12 hours 05/27/14 2318 05/29/14 1748   05/27/14 2330  piperacillin-tazobactam (ZOSYN) IVPB 3.375 g     3.375 g 100 mL/hr over 30 Minutes Intravenous  Once 05/27/14 2318 05/28/14 0142   05/27/14 1805  cefTRIAXone (ROCEPHIN) 1 G injection    Comments:  Malon Kindle   : cabinet override      05/27/14 1805 05/28/14 0614   05/27/14 1730  cefTRIAXone (ROCEPHIN) 1 g in dextrose 5 % 50 mL IVPB     1 g 100 mL/hr over 30 Minutes Intravenous  Once 05/27/14 1723 05/27/14 1853   05/27/14 1645  vancomycin (VANCOCIN) IVPB 1000 mg/200 mL premix     1,000 mg 200 mL/hr over 60 Minutes Intravenous  Once 05/27/14 1643 05/27/14 1809          Objective:   Filed Vitals:   05/29/14 1753 05/29/14 2347 05/30/14 0500 05/30/14 0555  BP: 102/69 94/60  94/65  Pulse: 88 88  77  Temp: 98.3 F (36.8 C) 98.3 F (36.8 C)  98.3 F (36.8 C)  TempSrc: Oral Oral  Oral  Resp: 18 18  18   Height:      Weight:   70.8 kg (156 lb 1.4 oz)   SpO2: 96% 96%  94%    Wt Readings from Last 3 Encounters:  05/30/14 70.8 kg (156 lb 1.4 oz)  05/30/14 70.8 kg (156 lb 1.4 oz)  01/22/14 66.679 kg (147 lb)    No intake or output data in the 24 hours ending 05/30/14 0954   Physical Exam  Somnolent , Oriented X 3, No new F.N deficits, Normal affect [place].AT,PERRAL Supple Neck,No JVD, No cervical lymphadenopathy appriciated.  Symmetrical Chest wall movement, Good air movement bilaterally, CTAB RRR,No Gallops,Rubs or new Murmurs, No Parasternal Heave +ve B.Sounds, Abd Soft, No tenderness, No organomegaly appriciated, No rebound - guarding or rigidity. No Cyanosis, Clubbing or edema, No new Rash or bruise   L arm in splint   Data Review   Micro Results Recent Results (from the past 240 hour(s))  URINE CULTURE     Status: None   Collection Time    05/27/14  4:52 PM      Result Value Ref Range Status   Specimen Description URINE, RANDOM   Final    Special Requests NONE   Final   Culture  Setup Time     Final   Value: 05/27/2014 22:47     Performed at [facility]     Final   Value: >=100,000 COLONIES/ML     Performed at [facility]  Culture     Final   Value: STAPHYLOCOCCUS SPECIES (COAGULASE NEGATIVE)     Note: RIFAMPIN AND GENTAMICIN SHOULD NOT BE USED AS SINGLE DRUGS FOR TREATMENT OF STAPH INFECTIONS.     Performed at [facility]   Report Status 05/30/2014 FINAL   Final   Organism ID, Bacteria STAPHYLOCOCCUS SPECIES (COAGULASE NEGATIVE)   Final  SURGICAL PCR SCREEN     Status: Abnormal   Collection Time    05/28/14  1:31 AM      Result Value Ref Range Status   MRSA, PCR POSITIVE (*) NEGATIVE Final   Comment: RESULT CALLED TO, READ BACK BY AND VERIFIED WITH:     R YNOP,RN [phone] SKEENP/WILDERK   Staphylococcus aureus POSITIVE (*) NEGATIVE Final   Comment: RESULT CALLED TO, READ BACK BY AND VERIFIED WITHMadie Reno [phone] SKEENP/WILDERK                The Xpert SA Assay (FDA     approved for NASAL specimens     in patients over 54 years of age),     is one component of     a comprehensive surveillance     program.  Test performance has     been validated by [employer] for patients greater     than or equal to 73 year old.     It is not intended     to diagnose infection nor to     guide or monitor treatment.  ANAEROBIC CULTURE     Status: None   Collection Time    05/28/14  3:17 AM      Result Value Ref Range Status   Specimen Description ABSCESS LEFT HAND   Final   Special Requests NONE   Final   Gram Stain     Final   Value: ABUNDANT WBC PRESENT,BOTH PMN AND MONONUCLEAR     NO SQUAMOUS EPITHELIAL CELLS SEEN     RARE GRAM POSITIVE COCCI     IN PAIRS     Performed at [facility]   Culture     Final   Value: NO ANAEROBES ISOLATED; CULTURE IN PROGRESS FOR 5 DAYS     Performed at [facility]   Report Status PENDING   Incomplete    CULTURE, ROUTINE-ABSCESS     Status: None   Collection Time    05/28/14  3:17 AM      Result Value Ref Range Status   Specimen Description ABSCESS LEFT HAND   Final   Special Requests NONE   Final   Gram Stain     Final   Value: ABUNDANT WBC PRESENT,BOTH PMN AND MONONUCLEAR     NO SQUAMOUS EPITHELIAL CELLS SEEN     RARE GRAM POSITIVE COCCI     IN PAIRS     Performed at [facility]   Culture PENDING   Incomplete   Report Status PENDING   Incomplete  CULTURE, BLOOD (ROUTINE X 2)     Status: None   Collection Time    05/28/14  1:45 PM      Result Value Ref Range Status   Specimen Description BLOOD LEFT ARM   Final   Special Requests BOTTLES DRAWN AEROBIC ONLY 4CC   Final   Culture  Setup Time     Final   Value: 05/28/2014 19:16     Performed at [facility]  Final   Value:        BLOOD CULTURE RECEIVED NO GROWTH TO DATE CULTURE WILL BE HELD FOR 5 DAYS BEFORE ISSUING A FINAL NEGATIVE REPORT     Performed at [facility]   Report Status PENDING   Incomplete  CULTURE, BLOOD (ROUTINE X 2)     Status: None   Collection Time    05/28/14  2:00 PM      Result Value Ref Range Status   Specimen Description BLOOD LEFT ARM   Final   Special Requests BOTTLES DRAWN AEROBIC ONLY 4CC   Final   Culture  Setup Time     Final   Value: 05/28/2014 19:17     Performed at [facility]   Culture     Final   Value:        BLOOD CULTURE RECEIVED NO GROWTH TO DATE CULTURE WILL BE HELD FOR 5 DAYS BEFORE ISSUING A FINAL NEGATIVE REPORT     Performed at [facility]   Report Status PENDING   Incomplete    Radiology Reports Mr Brain Wo Contrast  05/28/2014   CLINICAL DATA:  New onset of slurred speech and LEFT arm and leg numbness. Patient is anticoagulated for prior stroke. Also lung cancer. COPD. Arm cellulitis.  EXAM: MRI HEAD WITHOUT CONTRAST  TECHNIQUE: Multiplanar, multiecho pulse sequences of the brain and surrounding structures were obtained  without intravenous contrast.  COMPARISON:  CT head 05/27/2014.  FINDINGS: The patient was unable to remain motionless for the exam. Small or subtle lesions could be overlooked.  No evidence for acute infarction, hemorrhage, mass lesion, hydrocephalus, or extra-axial fluid. Premature for age cerebral and cerebellar atrophy. Mild subcortical and periventricular T2 and FLAIR hyperintensities, likely chronic microvascular ischemic change. Flow voids are maintained throughout the carotid, basilar, and vertebral arteries. There are no areas of chronic hemorrhage. Pituitary, pineal, and cerebellar tonsils unremarkable. No upper cervical lesions. Slight marrow heterogeneity at C3-C5 could represent nonspecific osseous disease. This area is incompletely evaluated on brain MR. negative orbits. Chronic sinus disease. Small BILATERAL mastoid effusions.  IMPRESSION: No evidence for acute stroke, intracranial hemorrhage, or intracranial mass lesion. Premature atrophy.  Slight heterogeneity of marrow signal from the C3-C5 vertebral bodies is incompletely evaluated on this motion degraded MR brain scan. If there is concern for cervical spine metastatic disease, or cervical radiculopathy or myelopathy accounting for LEFT arm and leg symptoms, consider MRI cervical spine for further evaluation.   Electronically Signed   By: Rolla Flatten M.D.   On: 05/28/2014 21:28    CBC  Recent Labs Lab 05/27/14 1621 05/28/14 0845 05/29/14 0650 05/30/14 0515  WBC 18.6* 16.1* 16.2* 10.8*  HGB 13.0 12.0* 12.7* 11.6*  HCT 39.3 36.7* 37.5* 35.3*  PLT 329 325 346 341  MCV 94.2 93.9 90.4 92.4  MCH 31.2 30.7 30.6 30.4  MCHC 33.1 32.7 33.9 32.9  RDW 16.7* 16.3* 15.4 15.4  LYMPHSABS 1.3 2.5  --   --   MONOABS 1.1* 1.3*  --   --   EOSABS 0.4 0.1  --   --   BASOSABS 0.2* 0.0  --   --     Chemistries   Recent Labs Lab 05/27/14 1621 05/28/14 0845 05/29/14 0650  NA 137 132* 133*  K 4.7 4.4 4.0  CL 98 97 95*  CO2 23 23 22     GLUCOSE 93 92 91  BUN 7 8 7   CREATININE 0.90 0.97 0.74  CALCIUM 9.3 8.3* 8.8  AST 43* 29  --   ALT 31 26  --   ALKPHOS 66 60  --   BILITOT <0.2* 0.3  --    ------------------------------------------------------------------------------------------------------------------ estimated creatinine clearance is 99.8 ml/min (by C-G formula based on Cr of 0.74). ------------------------------------------------------------------------------------------------------------------ No results found for this basename: HGBA1C,  in the last 72 hours ------------------------------------------------------------------------------------------------------------------ No results found for this basename: CHOL, HDL, LDLCALC, TRIG, CHOLHDL, LDLDIRECT,  in the last 72 hours ------------------------------------------------------------------------------------------------------------------ No results found for this basename: TSH, T4TOTAL, FREET3, T3FREE, THYROIDAB,  in the last 72 hours ------------------------------------------------------------------------------------------------------------------ No results found for this basename: VITAMINB12, FOLATE, FERRITIN, TIBC, IRON, RETICCTPCT,  in the last 72 hours  Coagulation profile  Recent Labs Lab 05/27/14 1700 05/28/14 0845 05/29/14 0650 05/30/14 0515  INR 1.81* 1.53* 2.32* 2.59*    No results found for this basename: DDIMER,  in the last 72 hours  Cardiac Enzymes  Recent Labs Lab 05/27/14 1700 05/27/14 2312  TROPONINI <0.30 <0.30   ------------------------------------------------------------------------------------------------------------------ No components found with this basename: POCBNP,      Time Spent in minutes   35   Raye Wiens K M.D on 05/30/2014 at 9:54 AM  Between 7am to 7pm - Pager - [phone]  After 7pm go to [email or web address] - password [email or web address]  And look for the night coverage person covering for me after hours  [facility] Office  [phone]   **Disclaimer: This note may have been dictated with voice recognition software. Similar sounding words can inadvertently be transcribed and this note may contain transcription errors which may not have been corrected upon publication of note.**

## 2014-05-30 NOTE — Progress Notes (Signed)
Patient set off bed alarm trying to use urinal. Wife at bedside. Fall risk discussed with patient and wife and both stated they understood the risk but did not want to use the bed alarm. Wife stated she would ensure that he calls for help when getting out of bed. Will continue to monitor.

## 2014-05-31 LAB — CBC
HCT: 34.5 % — ABNORMAL LOW (ref 39.0–52.0)
Hemoglobin: 11.4 g/dL — ABNORMAL LOW (ref 13.0–17.0)
MCH: 30.4 pg (ref 26.0–34.0)
MCHC: 33 g/dL (ref 30.0–36.0)
MCV: 92 fL (ref 78.0–100.0)
Platelets: 311 K/uL (ref 150–400)
RBC: 3.75 MIL/uL — ABNORMAL LOW (ref 4.22–5.81)
RDW: 15.1 % (ref 11.5–15.5)
WBC: 7.8 K/uL (ref 4.0–10.5)

## 2014-05-31 LAB — PROTIME-INR
INR: 2.32 — ABNORMAL HIGH (ref 0.00–1.49)
Prothrombin Time: 25.5 seconds — ABNORMAL HIGH (ref 11.6–15.2)

## 2014-05-31 MED ORDER — DOXYCYCLINE HYCLATE 100 MG PO CAPS
100.0000 mg | ORAL_CAPSULE | Freq: Two times a day (BID) | ORAL | Status: DC
Start: 2014-05-31 — End: 2014-07-12

## 2014-05-31 MED ORDER — OXYCODONE HCL 15 MG PO TABS
15.0000 mg | ORAL_TABLET | Freq: Four times a day (QID) | ORAL | Status: DC | PRN
Start: 2014-05-31 — End: 2014-06-14

## 2014-05-31 MED ORDER — WARFARIN SODIUM 5 MG PO TABS
5.0000 mg | ORAL_TABLET | Freq: Once | ORAL | Status: DC
  Filled 2014-05-31: qty 1

## 2014-05-31 MED ORDER — THIAMINE HCL 100 MG PO TABS: 100.0000 mg | ORAL_TABLET | Freq: Every day | ORAL | Status: DC

## 2014-05-31 MED ORDER — FOLIC ACID 1 MG PO TABS: 1.0000 mg | ORAL_TABLET | Freq: Every day | ORAL | Status: DC

## 2014-05-31 NOTE — Progress Notes (Signed)
NURSING PROGRESS NOTE  Robert [name] [id number] Discharge Data: 05/31/2014 1:34 PM Attending Provider: Thurnell Lose, MD PZW:CHENIDPOE,UMPNTI, MD   [name] to be D/C'd Home per MD order.    All IV's will be discontinued and monitored for bleeding.  All belongings will be returned to patient for patient to take home.  Last Documented Vital Signs:  Blood pressure 97/66, pulse 85, temperature 98.1 F (36.7 C), temperature source Oral, resp. rate 18, height 5\' 7"  (1.702 m), weight 70.534 kg (155 lb 8 oz), SpO2 96.00%.  [name], MSN, RN, [employer]

## 2014-05-31 NOTE — Discharge Summary (Addendum)
Robert [name], is a 53 y.o. male  DOB 1961/04/19  MRN [id number].  Admission date:  05/27/2014  Admitting Physician  Alphia Moh, MD  Discharge Date:  05/31/2014   Primary MD  Bufford Buttner, MD  Recommendations for primary care physician for things to follow:   Repeat CBC, CMP and INR in a week. Monitor left arm cellulitis and abscess closely. Needs close outpatient hand surgery followup.   Admission Diagnosis  Cellulitis of left hand [682.4] Sepsis affecting skin [682.9]   Discharge Diagnosis  Cellulitis of left hand [682.4] Sepsis affecting skin [682.9]    Principal Problem:   Cellulitis of left hand Active Problems:   Lung cancer   Pulmonary emboli   Low back pain   Chronically on opiate therapy   Chronic anticoagulation   Disturbance of skin sensation      Past Medical History  Diagnosis Date  . Deep vein thrombosis     "several"  . Pulmonary emboli     "several"  . COPD (chronic obstructive pulmonary disease)   . Partial small bowel obstruction   . Myocardial infarction 2007; ~ 2011  . Chronic liver failure   . Shortness of breath     "all the time"  . Oxygen dependent     3L; 24/7" (05/27/2014)  . History of blood transfusion 2013    "related to kidneys shutting down"  . Hepatitis C   . Depression   . Hypertension   . Hypothyroidism   . Asthma   . Pneumonia     "several times"  . GERD (gastroesophageal reflux disease)   . Migraines     "2-3/day" (05/27/2014)  . Stroke ~ 03/2012    "couldn't use my left hand for ~ 6 months" (05/27/2014)  . Stroke 05/25/2014    "not able to use my left hand again" (05/27/2014)  . Arthritis     "I'm eat up w/it"  . Chronic lower back pain   . Anxiety   . Intermittent self-catheterization of bladder   . Liver cancer   . Metastatic lung cancer    "left"  . B12 deficiency     "give myself shots"    Past Surgical History  Procedure Laterality Date  . Transurethral resection of prostate  2012; 2014  . Tonsillectomy and adenoidectomy  1974  . Knee surgery Left ~ 1972    "cut top of my kneecap off"  . Multiple tooth extractions  11/2010    "26"       History of present illness and  Hospital Course:     Kindly see H&P for history of present illness and admission details, please review complete Labs, Consult reports and Test reports for all details in brief  HPI  from the history and physical done on the day of admission   Robert [name] is a 53 y.o. 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  The patient presented with complaints of left hand pain and swelling. He mentions that he was working with his truck and after that started having pain on his left wrist. Later on as the day progressed he started having redness and swelling of the hand. Since last 2 days he has been having severe pain and swelling at the left wrist and unable to move the wrist. He feels as if the left hand has stopped responding. He has multiple scratch marks and mentions he had a fall 2 weeks ago and has sustained scratches from that. He denies being an IV drug abuser.  He is an active smoker denies daily alcohol use mentions drinks 2-3 beers 2-3 times a week.  In the after at an unknown time the patient's wife felt that the patient was having difficulty speaking clearly. Also 2 days ago wife noted that the patient was having trouble controlling the car and up on arrival at home he had some chest pain and passed out. Patient denies any chest the time of my evaluation.   He complains of some tingling and increased sensation on the left side.   The patient is coming from home. And at his  baseline independent for most of his ADL.    Hospital Course    1. Sepsis due to Cellulitis and abscess of the left hand, wrist and wrist joint. Status post incision and drainage by Dr. Fredna Dow on 05/27/2014, wound and urine cultures noted. Blood cultures negative thus far. Case discussed in detail with hand surgeon personally on 05/31/2014, he did not have joint abscess which was confirmed by the hand surgeon. Hand surgeon recommends 10 more days of oral doxycycline which will be prescribed. He is going to [name] for further wound care. I will also order home RN along with PT OT.    2. 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 Not a candidate for chemoradiation all surgery per wife, he was recommended for hospice treatment but he revoked hospice treatment few weeks ago, supportive care only. He currently wishes to be full code. Long-term prognosis poor.     3. 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 Pharmacy adjusting dose.     4. Chronic pain. Minimize narcotics and benzodiazepines as he is somewhat somnolent, as needed Narcan. Counseled not to overuse narcotics and sedative medications. Chronically runs low blood pressure and takes high doses of narcotics, I have tried to taper down his home narcotic medication dosages.    5. History of alcohol abuse. Counseled to quit, is on folic acid and thiamine no signs of DTs.    6. Left arm paresthesias tingling numbness. Likely due to #1 above. Seen by neuro, CT head unremarkable. -ve MRI.       Discharge Condition: stable   Follow UP  Follow-up Information   Follow up with Tennis Must, MD On 05/31/2014. (2pm)    Specialty:  Orthopedic Surgery   Contact information:   [place] [address] [phone]       Follow up with [facility]    . Schedule an appointment as soon as possible for a visit in 1 week. (and your Lung doctor)    Contact information:   [place] [address] [phone]      Follow up with Bufford Buttner, MD. Schedule an appointment as soon as possible for a visit in 3 days.   Specialty:  Family Medicine  Contact information:   [place] [address] [phone]         Discharge Instructions  and  Discharge Medications         Discharge Instructions   Diet - low sodium heart healthy    Complete by:  As directed      Discharge instructions    Complete by:  As directed   Follow with Primary MD  in 7 days , keep your left arm clean and dry at all times. Keep it elevated.  Get CBC, CMP, INR, 2 view Chest X ray checked  by Primary MD next visit.    Activity: As tolerated with Full fall precautions use walker/cane & assistance as needed   Disposition Home    Diet: Heart Healthy   For Heart failure patients - Check your Weight same time everyday, if you gain over 2 pounds, or you develop in leg swelling, experience more shortness of breath or chest pain, call your Primary MD immediately. Follow Cardiac Low Salt Diet and 1.8 lit/day fluid restriction.   On your next visit with her primary care physician please Get Medicines reviewed and adjusted.  Please request your Prim.MD to go over all Hospital Tests and Procedure/Radiological results at the follow up, please get all Hospital records sent to your Prim MD by signing hospital release before you go home.   If you experience worsening of your admission symptoms, develop shortness of breath, life threatening emergency, suicidal or homicidal thoughts you must seek medical attention immediately by calling 911 or calling your MD immediately  if symptoms less severe.  You Must read complete instructions/literature along with all the possible adverse reactions/side effects for all the Medicines you take and that have been prescribed to you. Take any new Medicines after you have completely understood and accpet all the possible  adverse reactions/side effects.   Do not drive, operating heavy machinery, perform activities at heights, swimming or participation in water activities or provide baby sitting services if your were admitted for syncope or siezures until you have seen by Primary MD or a Neurologist and advised to do so again.  Do not drive when taking Pain medications.    Do not take more than prescribed Pain, Sleep and Anxiety Medications  Special Instructions: If you have smoked or chewed Tobacco  in the last 2 yrs please stop smoking, stop any regular Alcohol  and or any Recreational drug use.  Wear Seat belts while driving.   Please note  You were cared for by a hospitalist during your hospital stay. If you have any questions about your discharge medications or the care you received while you were in the hospital after you are discharged, you can call the unit and asked to speak with the hospitalist on call if the hospitalist that took care of you is not available. Once you are discharged, your primary care physician will handle any further medical issues. Please note that NO REFILLS for any discharge medications will be authorized once you are discharged, as it is imperative that you return to your primary care physician (or establish a relationship with a primary care physician if you do not have one) for your aftercare needs so that they can reassess your need for medications and monitor your lab values.     Increase activity slowly    Complete by:  As directed             Medication List    STOP taking  these medications       ciprofloxacin 500 MG tablet  Commonly known as:  CIPRO     OLANZapine 5 MG tablet  Commonly known as:  ZYPREXA     pregabalin 75 MG capsule  Commonly known as:  LYRICA      TAKE these medications       COMBIVENT RESPIMAT 20-100 MCG/ACT Aers respimat  Generic drug:  Ipratropium-Albuterol  Inhale 1 puff into the lungs daily.     cyanocobalamin 1000 MCG/ML  injection  Commonly known as:  (VITAMIN B-12)  Inject 1,000 mcg into the muscle once a week. On monday     diazepam 10 MG tablet  Commonly known as:  VALIUM  Take 10 mg by mouth daily.     doxycycline 100 MG capsule  Commonly known as:  VIBRAMYCIN  Take 1 capsule (100 mg total) by mouth 2 (two) times daily.     DULoxetine 60 MG capsule  Commonly known as:  CYMBALTA  Take 60 mg by mouth 2 (two) times daily.     folic acid 1 MG tablet  Commonly known as:  FOLVITE  Take 1 tablet (1 mg total) by mouth daily.     gabapentin 400 MG capsule  Commonly known as:  NEURONTIN  Take 400 mg by mouth 3 (three) times daily.     levothyroxine 50 MCG tablet  Commonly known as:  SYNTHROID, LEVOTHROID  Take 50 mcg by mouth daily before breakfast.     multivitamin with minerals tablet  Take 1 tablet by mouth daily.     omeprazole 20 MG capsule  Commonly known as:  PRILOSEC  Take 20 mg by mouth 2 (two) times daily.     oxycodone 30 MG immediate release tablet  Commonly known as:  ROXICODONE  Take 30 mg by mouth every 4 (four) hours as needed for pain.     oxyCODONE 20 MG 12 hr tablet  Commonly known as:  OXYCONTIN  Take 1 tablet (20 mg total) by mouth every 12 (twelve) hours.     oxyCODONE 15 MG immediate release tablet  Commonly known as:  ROXICODONE  Take 1 tablet (15 mg total) by mouth every 6 (six) hours as needed for pain.     potassium chloride SA 20 MEQ tablet  Commonly known as:  K-DUR,KLOR-CON  Take 20 mEq by mouth at bedtime.     promethazine 25 MG tablet  Commonly known as:  PHENERGAN  Take 25 mg by mouth 2 (two) times daily.     tamsulosin 0.4 MG Caps capsule  Commonly known as:  FLOMAX  Take 0.4 mg by mouth at bedtime.     thiamine 100 MG tablet  Take 1 tablet (100 mg total) by mouth daily.     warfarin 5 MG tablet  Commonly known as:  COUMADIN  Take 7.5 mg by mouth daily.          Diet and Activity recommendation: See Discharge Instructions  above   Consults obtained - Hand Surgery   Major procedures and Radiology Reports - PLEASE review detailed and final reports for all details, in brief -      Dg Chest 2 View  05/27/2014   CLINICAL DATA:  Difficulty breathing and pain  EXAM: CHEST  2 VIEW  COMPARISON:  Chest radiograph December 08, 2010 and chest CT May 30, 2012  FINDINGS: There is patchy bibasilar atelectatic change. Elsewhere lungs are clear. Heart size and pulmonary vascularity are normal. No adenopathy. No  pneumothorax. No appreciable bone lesions.  IMPRESSION: Patchy bibasilar atelectatic change. Elsewhere lungs are clear. No demonstrable pneumothorax.   Electronically Signed   By: Lowella Grip M.D.   On: 05/27/2014 17:39   Ct Head Wo Contrast  05/27/2014   CLINICAL DATA:  Slurred speech  EXAM: CT HEAD WITHOUT CONTRAST  TECHNIQUE: Contiguous axial images were obtained from the base of the skull through the vertex without intravenous contrast.  COMPARISON:  None.  FINDINGS: No acute intracranial hemorrhage. No focal mass lesion. No CT evidence of acute infarction. No midline shift or mass effect. No hydrocephalus. Basilar cisterns are patent. Minimal periventricular white matter hypodensity.  Mastoid air cells are clear. There is frothy material within the maxillary sinuses. Frontal sinuses are clear.  IMPRESSION: 1. No acute intracranial findings. 2. Maxillary sinusitis.   Electronically Signed   By: Suzy Bouchard M.D.   On: 05/27/2014 17:43   Mr Brain Wo Contrast  05/28/2014   CLINICAL DATA:  New onset of slurred speech and LEFT arm and leg numbness. Patient is anticoagulated for prior stroke. Also lung cancer. COPD. Arm cellulitis.  EXAM: MRI HEAD WITHOUT CONTRAST  TECHNIQUE: Multiplanar, multiecho pulse sequences of the brain and surrounding structures were obtained without intravenous contrast.  COMPARISON:  CT head 05/27/2014.  FINDINGS: The patient was unable to remain motionless for the exam. Small or subtle  lesions could be overlooked.  No evidence for acute infarction, hemorrhage, mass lesion, hydrocephalus, or extra-axial fluid. Premature for age cerebral and cerebellar atrophy. Mild subcortical and periventricular T2 and FLAIR hyperintensities, likely chronic microvascular ischemic change. Flow voids are maintained throughout the carotid, basilar, and vertebral arteries. There are no areas of chronic hemorrhage. Pituitary, pineal, and cerebellar tonsils unremarkable. No upper cervical lesions. Slight marrow heterogeneity at C3-C5 could represent nonspecific osseous disease. This area is incompletely evaluated on brain MR. negative orbits. Chronic sinus disease. Small BILATERAL mastoid effusions.  IMPRESSION: No evidence for acute stroke, intracranial hemorrhage, or intracranial mass lesion. Premature atrophy.  Slight heterogeneity of marrow signal from the C3-C5 vertebral bodies is incompletely evaluated on this motion degraded MR brain scan. If there is concern for cervical spine metastatic disease, or cervical radiculopathy or myelopathy accounting for LEFT arm and leg symptoms, consider MRI cervical spine for further evaluation.   Electronically Signed   By: Rolla Flatten M.D.   On: 05/28/2014 21:28   Dg Hand Complete Left  05/27/2014   CLINICAL DATA:  Pain and redness.  Swelling.  EXAM: LEFT HAND - COMPLETE 3+ VIEW  COMPARISON:  None.  FINDINGS: Mild first MCP joint osteoarthritis. Anatomic alignment. No fracture. Diffuse swelling of the hand. No gas in the soft tissues. No erosive changes of bone. Benign osteochondroma projects off the radial and volar aspect of the first metacarpal.  IMPRESSION: Diffuse soft tissue swelling of the hand. No acute osseous abnormality.   Electronically Signed   By: Dereck Ligas M.D.   On: 05/27/2014 17:37    Micro Results      Recent Results (from the past 240 hour(s))  URINE CULTURE     Status: None   Collection Time    05/27/14  4:52 PM      Result Value Ref  Range Status   Specimen Description URINE, RANDOM   Final   Special Requests NONE   Final   Culture  Setup Time     Final   Value: 05/27/2014 22:47     Performed at [facility]  Count     Final   Value: >=100,000 COLONIES/ML     Performed at [facility]   Culture     Final   Value: STAPHYLOCOCCUS SPECIES (COAGULASE NEGATIVE)     Note: RIFAMPIN AND GENTAMICIN SHOULD NOT BE USED AS SINGLE DRUGS FOR TREATMENT OF STAPH INFECTIONS.     Performed at [facility]   Report Status 05/30/2014 FINAL   Final   Organism ID, Bacteria STAPHYLOCOCCUS SPECIES (COAGULASE NEGATIVE)   Final  SURGICAL PCR SCREEN     Status: Abnormal   Collection Time    05/28/14  1:31 AM      Result Value Ref Range Status   MRSA, PCR POSITIVE (*) NEGATIVE Final   Comment: RESULT CALLED TO, READ BACK BY AND VERIFIED WITH:     R YNOP,RN [phone] SKEENP/WILDERK   Staphylococcus aureus POSITIVE (*) NEGATIVE Final   Comment: RESULT CALLED TO, READ BACK BY AND VERIFIED WITHMadie Reno [phone] SKEENP/WILDERK                The Xpert SA Assay (FDA     approved for NASAL specimens     in patients over 12 years of age),     is one component of     a comprehensive surveillance     program.  Test performance has     been validated by [employer] for patients greater     than or equal to 68 year old.     It is not intended     to diagnose infection nor to     guide or monitor treatment.  ANAEROBIC CULTURE     Status: None   Collection Time    05/28/14  3:17 AM      Result Value Ref Range Status   Specimen Description ABSCESS LEFT HAND   Final   Special Requests NONE   Final   Gram Stain     Final   Value: ABUNDANT WBC PRESENT,BOTH PMN AND MONONUCLEAR     NO SQUAMOUS EPITHELIAL CELLS SEEN     RARE GRAM POSITIVE COCCI     IN PAIRS     Performed at [facility]   Culture     Final   Value: NO ANAEROBES ISOLATED; CULTURE IN PROGRESS FOR 5 DAYS     Performed at  [facility]   Report Status PENDING   Incomplete  CULTURE, ROUTINE-ABSCESS     Status: None   Collection Time    05/28/14  3:17 AM      Result Value Ref Range Status   Specimen Description ABSCESS LEFT HAND   Final   Special Requests NONE   Final   Gram Stain     Final   Value: ABUNDANT WBC PRESENT,BOTH PMN AND MONONUCLEAR     NO SQUAMOUS EPITHELIAL CELLS SEEN     RARE GRAM POSITIVE COCCI     IN PAIRS     Performed at [facility]   Culture     Final   Value: FEW METHICILLIN RESISTANT STAPHYLOCOCCUS AUREUS     Note: RIFAMPIN AND GENTAMICIN SHOULD NOT BE USED AS SINGLE DRUGS FOR TREATMENT OF STAPH INFECTIONS. CRITICAL RESULT CALLED TO, READ BACK BY AND VERIFIED WITH: DELCINE JOHNSON @ 20 ON [id number] BY [facility]     Performed at [facility]   Report Status 05/30/2014 FINAL   Final   Organism ID, Bacteria  METHICILLIN RESISTANT STAPHYLOCOCCUS AUREUS   Final  CULTURE, BLOOD (ROUTINE X 2)     Status: None   Collection Time    05/28/14  1:45 PM      Result Value Ref Range Status   Specimen Description BLOOD LEFT ARM   Final   Special Requests BOTTLES DRAWN AEROBIC ONLY 4CC   Final   Culture  Setup Time     Final   Value: 05/28/2014 19:16     Performed at [facility]   Culture     Final   Value:        BLOOD CULTURE RECEIVED NO GROWTH TO DATE CULTURE WILL BE HELD FOR 5 DAYS BEFORE ISSUING A FINAL NEGATIVE REPORT     Performed at [facility]   Report Status PENDING   Incomplete  CULTURE, BLOOD (ROUTINE X 2)     Status: None   Collection Time    05/28/14  2:00 PM      Result Value Ref Range Status   Specimen Description BLOOD LEFT ARM   Final   Special Requests BOTTLES DRAWN AEROBIC ONLY 4CC   Final   Culture  Setup Time     Final   Value: 05/28/2014 19:17     Performed at [facility]   Culture     Final   Value:        BLOOD CULTURE RECEIVED NO GROWTH TO DATE CULTURE WILL BE HELD FOR 5 DAYS BEFORE ISSUING A FINAL NEGATIVE REPORT      Performed at [facility]   Report Status PENDING   Incomplete       [name]   Subjective:   Robert [name] [name] has no headache,no chest abdominal pain,no new weakness tingling or numbness, feels much better wants to go home [name].    Objective:   Blood pressure 97/66, pulse 85, temperature 98.1 F (36.7 C), temperature source Oral, resp. rate 18, height 5\' 7"  (1.702 m), weight 70.534 kg (155 lb 8 oz), SpO2 96.00%.   Intake/Output Summary (Last 24 hours) at 05/31/14 1112 Last data filed at 05/31/14 0929  Gross per 24 hour  Intake    120 ml  Output    250 ml  Net   -130 ml    Exam Awake Alert, Oriented x 3, No new F.N deficits, Normal affect [place].AT,PERRAL Supple Neck,No JVD, No cervical lymphadenopathy appriciated.  Symmetrical Chest wall movement, Good air movement bilaterally, CTAB RRR,No Gallops,Rubs or new Murmurs, No Parasternal Heave +ve B.Sounds, Abd Soft, Non tender, No organomegaly appriciated, No rebound -guarding or rigidity. No Cyanosis, Clubbing or edema, No new Rash or bruise L arm in splint  Data Review   CBC w Diff: Lab Results  Component Value Date   WBC 7.8 05/31/2014   HGB 11.4* 05/31/2014   HCT 34.5* 05/31/2014   PLT 311 05/31/2014   LYMPHOPCT 15 05/28/2014   MONOPCT 8 05/28/2014   EOSPCT 1 05/28/2014   BASOPCT 0 05/28/2014    CMP: Lab Results  Component Value Date   NA 133* 05/29/2014   K 4.0 05/29/2014   CL 95* 05/29/2014   CO2 22 05/29/2014   BUN 7 05/29/2014   CREATININE 0.74 05/29/2014   PROT 6.1 05/28/2014   ALBUMIN 2.6* 05/28/2014   BILITOT 0.3 05/28/2014   ALKPHOS 60 05/28/2014   AST 29 05/28/2014   ALT 26 05/28/2014  . Lab Results  Component Value Date   INR 2.32* 05/31/2014   INR 2.59* 05/30/2014  INR 2.32* 05/29/2014      Total Time in preparing paper work, data evaluation and todays exam - 35 minutes  Thurnell Lose M.D on 05/31/2014 at [place]  [phone]   **Disclaimer: This note  may have been dictated with voice recognition software. Similar sounding words can inadvertently be transcribed and this note may contain transcription errors which may not have been corrected upon publication of note.**
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
## 2014-05-31 NOTE — Care Management Note (Signed)
    Page 1 of 1   05/31/2014     4:43:22 PM CARE MANAGEMENT NOTE 05/31/2014  Patient:  Robert [name], Robert [name]   Account Number:  [id number]  Date Initiated:  05/31/2014  Documentation initiated by:  Tomi Bamberger  Subjective/Objective Assessment:   dx left side numbness, abscess of left wrist.  admit- lives with spouse.     Action/Plan:   Anticipated DC Date:  05/31/2014   Anticipated DC Plan:  [place]  CM consult      [facility] Choice  HOME HEALTH   Choice offered to / List presented to:  C-1 Patient        [place] arranged  HH-1 RN      [place].   Status of service:  Completed, signed off Medicare Important Message given?  NO (If response is "NO", the following Medicare IM given date fields will be blank) Date Medicare IM given:   Medicare IM given by:   Date Additional Medicare IM given:   Additional Medicare IM given by:    Discharge Disposition:  [place]  Per UR Regulation:  Reviewed for med. necessity/level of care/duration of stay  If discussed at [place] of Stay Meetings, dates discussed:    Comments:  05/31/14 [place], BSN [phone] patient lives with spouse, patient chose [facility] for [facility] , referral made to [facility] for [facility], soc will begin 24-48 hrs post dc.

## 2014-05-31 NOTE — Discharge Instructions (Signed)
Follow with Primary MD  in 7 days , keep your left arm clean and dry at all times. Keep it elevated.  Get CBC, CMP, INR, 2 view Chest X ray checked  by Primary MD next visit.    Activity: As tolerated with Full fall precautions use walker/cane & assistance as needed   Disposition Home    Diet: Heart Healthy   For Heart failure patients - Check your Weight same time everyday, if you gain over 2 pounds, or you develop in leg swelling, experience more shortness of breath or chest pain, call your Primary MD immediately. Follow Cardiac Low Salt Diet and 1.8 lit/day fluid restriction.   On your next visit with her primary care physician please Get Medicines reviewed and adjusted.  Please request your Prim.MD to go over all Hospital Tests and Procedure/Radiological results at the follow up, please get all Hospital records sent to your Prim MD by signing hospital release before you go home.   If you experience worsening of your admission symptoms, develop shortness of breath, life threatening emergency, suicidal or homicidal thoughts you must seek medical attention immediately by calling 911 or calling your MD immediately  if symptoms less severe.  You Must read complete instructions/literature along with all the possible adverse reactions/side effects for all the Medicines you take and that have been prescribed to you. Take any new Medicines after you have completely understood and accpet all the possible adverse reactions/side effects.   Do not drive, operating heavy machinery, perform activities at heights, swimming or participation in water activities or provide baby sitting services if your were admitted for syncope or siezures until you have seen by Primary MD or a Neurologist and advised to do so again.  Do not drive when taking Pain medications.    Do not take more than prescribed Pain, Sleep and Anxiety Medications  Special Instructions: If you have smoked or chewed Tobacco  in the  last 2 yrs please stop smoking, stop any regular Alcohol  and or any Recreational drug use.  Wear Seat belts while driving.   Please note  You were cared for by a hospitalist during your hospital stay. If you have any questions about your discharge medications or the care you received while you were in the hospital after you are discharged, you can call the unit and asked to speak with the hospitalist on call if the hospitalist that took care of you is not available. Once you are discharged, your primary care physician will handle any further medical issues. Please note that NO REFILLS for any discharge medications will be authorized once you are discharged, as it is imperative that you return to your primary care physician (or establish a relationship with a primary care physician if you do not have one) for your aftercare needs so that they can reassess your need for medications and monitor your lab values.
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
## 2014-06-01 ENCOUNTER — Encounter (HOSPITAL_COMMUNITY): Payer: Self-pay | Admitting: Orthopedic Surgery

## 2014-06-01 LAB — ANAEROBIC CULTURE

## 2014-06-03 LAB — CULTURE, BLOOD (ROUTINE X 2)
Culture: NO GROWTH
Culture: NO GROWTH

## 2014-06-07 ENCOUNTER — Emergency Department (HOSPITAL_BASED_OUTPATIENT_CLINIC_OR_DEPARTMENT_OTHER)
Admission: EM | Admit: 2014-06-07 | Discharge: 2014-06-07 | Disposition: A | Discharge status: Home / Self Care | Payer: Medicaid Other | Attending: Emergency Medicine | Admitting: Emergency Medicine

## 2014-06-07 ENCOUNTER — Encounter (HOSPITAL_BASED_OUTPATIENT_CLINIC_OR_DEPARTMENT_OTHER): Payer: Self-pay | Admitting: Emergency Medicine

## 2014-06-07 DIAGNOSIS — Z880 Allergy status to penicillin: Secondary | ICD-10-CM | POA: Insufficient documentation

## 2014-06-07 DIAGNOSIS — Z8619 Personal history of other infectious and parasitic diseases: Secondary | ICD-10-CM | POA: Insufficient documentation

## 2014-06-07 DIAGNOSIS — E039 Hypothyroidism, unspecified: Secondary | ICD-10-CM | POA: Insufficient documentation

## 2014-06-07 DIAGNOSIS — J45901 Unspecified asthma with (acute) exacerbation: Secondary | ICD-10-CM

## 2014-06-07 DIAGNOSIS — F411 Generalized anxiety disorder: Secondary | ICD-10-CM | POA: Insufficient documentation

## 2014-06-07 DIAGNOSIS — Z85118 Personal history of other malignant neoplasm of bronchus and lung: Secondary | ICD-10-CM | POA: Diagnosis not present

## 2014-06-07 DIAGNOSIS — G43909 Migraine, unspecified, not intractable, without status migrainosus: Secondary | ICD-10-CM | POA: Insufficient documentation

## 2014-06-07 DIAGNOSIS — Z8505 Personal history of malignant neoplasm of liver: Secondary | ICD-10-CM | POA: Insufficient documentation

## 2014-06-07 DIAGNOSIS — Z7901 Long term (current) use of anticoagulants: Secondary | ICD-10-CM | POA: Insufficient documentation

## 2014-06-07 DIAGNOSIS — I10 Essential (primary) hypertension: Secondary | ICD-10-CM | POA: Diagnosis not present

## 2014-06-07 DIAGNOSIS — G8929 Other chronic pain: Secondary | ICD-10-CM | POA: Diagnosis not present

## 2014-06-07 DIAGNOSIS — J441 Chronic obstructive pulmonary disease with (acute) exacerbation: Secondary | ICD-10-CM | POA: Insufficient documentation

## 2014-06-07 DIAGNOSIS — Z8673 Personal history of transient ischemic attack (TIA), and cerebral infarction without residual deficits: Secondary | ICD-10-CM | POA: Diagnosis not present

## 2014-06-07 DIAGNOSIS — F17200 Nicotine dependence, unspecified, uncomplicated: Secondary | ICD-10-CM | POA: Diagnosis not present

## 2014-06-07 DIAGNOSIS — I252 Old myocardial infarction: Secondary | ICD-10-CM | POA: Diagnosis not present

## 2014-06-07 DIAGNOSIS — Z86718 Personal history of other venous thrombosis and embolism: Secondary | ICD-10-CM | POA: Insufficient documentation

## 2014-06-07 DIAGNOSIS — F329 Major depressive disorder, single episode, unspecified: Secondary | ICD-10-CM | POA: Diagnosis not present

## 2014-06-07 DIAGNOSIS — Z79899 Other long term (current) drug therapy: Secondary | ICD-10-CM | POA: Diagnosis not present

## 2014-06-07 DIAGNOSIS — F3289 Other specified depressive episodes: Secondary | ICD-10-CM | POA: Insufficient documentation

## 2014-06-07 DIAGNOSIS — Z760 Encounter for issue of repeat prescription: Secondary | ICD-10-CM | POA: Diagnosis not present

## 2014-06-07 DIAGNOSIS — Z9981 Dependence on supplemental oxygen: Secondary | ICD-10-CM | POA: Diagnosis not present

## 2014-06-07 DIAGNOSIS — K219 Gastro-esophageal reflux disease without esophagitis: Secondary | ICD-10-CM | POA: Insufficient documentation

## 2014-06-07 DIAGNOSIS — M129 Arthropathy, unspecified: Secondary | ICD-10-CM | POA: Insufficient documentation

## 2014-06-07 DIAGNOSIS — Z86711 Personal history of pulmonary embolism: Secondary | ICD-10-CM | POA: Insufficient documentation

## 2014-06-07 DIAGNOSIS — Z792 Long term (current) use of antibiotics: Secondary | ICD-10-CM | POA: Diagnosis not present

## 2014-06-07 DIAGNOSIS — Z8701 Personal history of pneumonia (recurrent): Secondary | ICD-10-CM | POA: Insufficient documentation

## 2014-06-07 MED ORDER — OMEPRAZOLE 20 MG PO CPDR: 20.0000 mg | DELAYED_RELEASE_CAPSULE | Freq: Two times a day (BID) | ORAL | Status: DC

## 2014-06-07 MED ORDER — POTASSIUM CHLORIDE CRYS ER 20 MEQ PO TBCR: 20.0000 meq | EXTENDED_RELEASE_TABLET | Freq: Every day | ORAL | Status: DC

## 2014-06-07 MED ORDER — PROMETHAZINE HCL 25 MG PO TABS: 25.0000 mg | ORAL_TABLET | Freq: Two times a day (BID) | ORAL | Status: DC

## 2014-06-07 MED ORDER — OXYCODONE-ACETAMINOPHEN 5-325 MG PO TABS: 2.0000 | ORAL_TABLET | ORAL | Status: DC | PRN

## 2014-06-07 MED ORDER — DULOXETINE HCL 60 MG PO CPEP: 60.0000 mg | ORAL_CAPSULE | Freq: Two times a day (BID) | ORAL | Status: AC

## 2014-06-07 MED ORDER — WARFARIN SODIUM 5 MG PO TABS: 7.5000 mg | ORAL_TABLET | Freq: Every day | ORAL | Status: DC

## 2014-06-07 MED ORDER — DIAZEPAM 10 MG PO TABS: 10.0000 mg | ORAL_TABLET | Freq: Every day | ORAL | Status: DC

## 2014-06-07 MED ORDER — LEVOTHYROXINE SODIUM 50 MCG PO TABS: 50.0000 ug | ORAL_TABLET | Freq: Every day | ORAL | Status: AC

## 2014-06-07 MED ORDER — GABAPENTIN 400 MG PO CAPS: 400.0000 mg | ORAL_CAPSULE | Freq: Three times a day (TID) | ORAL | Status: DC

## 2014-06-07 MED ORDER — TAMSULOSIN HCL 0.4 MG PO CAPS: 0.4000 mg | ORAL_CAPSULE | Freq: Every day | ORAL | Status: DC

## 2014-06-07 NOTE — Discharge Instructions (Signed)
Medication Refill, Emergency Department °We have refilled your medication today as a courtesy to you. It is best for your medical care, however, to take care of getting refills done through your primary caregiver's office. They have your records and can do a better job of follow-up than we can in the emergency department. °On maintenance medications, we often only prescribe enough medications to get you by until you are able to see your regular caregiver. This is a more expensive way to refill medications. °In the future, please plan for refills so that you will not have to use the emergency department for this. °Thank you for your help. Your help allows us to better take care of the daily emergencies that enter our department. °Document Released: 01/04/2004 Document Revised: 12/10/2011 Document Reviewed: 12/25/2013 °ExitCare® Patient Information ©2015 [employer]. This information is not intended to replace advice given to you by your health care provider. Make sure you discuss any questions you have with your health care provider. ° °
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
## 2014-06-14 ENCOUNTER — Encounter (HOSPITAL_COMMUNITY): Payer: Self-pay | Admitting: Emergency Medicine

## 2014-06-14 ENCOUNTER — Emergency Department (HOSPITAL_COMMUNITY)
Admission: EM | Admit: 2014-06-14 | Discharge: 2014-06-14 | Disposition: A | Discharge status: Home / Self Care | Payer: Medicaid Other | Attending: Emergency Medicine | Admitting: Emergency Medicine

## 2014-06-14 DIAGNOSIS — F329 Major depressive disorder, single episode, unspecified: Secondary | ICD-10-CM | POA: Insufficient documentation

## 2014-06-14 DIAGNOSIS — M79609 Pain in unspecified limb: Secondary | ICD-10-CM | POA: Insufficient documentation

## 2014-06-14 DIAGNOSIS — Z9981 Dependence on supplemental oxygen: Secondary | ICD-10-CM | POA: Diagnosis not present

## 2014-06-14 DIAGNOSIS — Z86718 Personal history of other venous thrombosis and embolism: Secondary | ICD-10-CM | POA: Diagnosis not present

## 2014-06-14 DIAGNOSIS — M129 Arthropathy, unspecified: Secondary | ICD-10-CM | POA: Diagnosis not present

## 2014-06-14 DIAGNOSIS — Z8619 Personal history of other infectious and parasitic diseases: Secondary | ICD-10-CM | POA: Diagnosis not present

## 2014-06-14 DIAGNOSIS — E039 Hypothyroidism, unspecified: Secondary | ICD-10-CM | POA: Insufficient documentation

## 2014-06-14 DIAGNOSIS — J441 Chronic obstructive pulmonary disease with (acute) exacerbation: Secondary | ICD-10-CM | POA: Diagnosis not present

## 2014-06-14 DIAGNOSIS — Z792 Long term (current) use of antibiotics: Secondary | ICD-10-CM | POA: Insufficient documentation

## 2014-06-14 DIAGNOSIS — Z880 Allergy status to penicillin: Secondary | ICD-10-CM | POA: Diagnosis not present

## 2014-06-14 DIAGNOSIS — Z8673 Personal history of transient ischemic attack (TIA), and cerebral infarction without residual deficits: Secondary | ICD-10-CM | POA: Insufficient documentation

## 2014-06-14 DIAGNOSIS — Z760 Encounter for issue of repeat prescription: Secondary | ICD-10-CM | POA: Insufficient documentation

## 2014-06-14 DIAGNOSIS — Z7901 Long term (current) use of anticoagulants: Secondary | ICD-10-CM | POA: Diagnosis not present

## 2014-06-14 DIAGNOSIS — F17200 Nicotine dependence, unspecified, uncomplicated: Secondary | ICD-10-CM | POA: Diagnosis not present

## 2014-06-14 DIAGNOSIS — G8929 Other chronic pain: Secondary | ICD-10-CM | POA: Insufficient documentation

## 2014-06-14 DIAGNOSIS — Z85118 Personal history of other malignant neoplasm of bronchus and lung: Secondary | ICD-10-CM | POA: Insufficient documentation

## 2014-06-14 DIAGNOSIS — F3289 Other specified depressive episodes: Secondary | ICD-10-CM | POA: Diagnosis not present

## 2014-06-14 DIAGNOSIS — Z79899 Other long term (current) drug therapy: Secondary | ICD-10-CM | POA: Diagnosis not present

## 2014-06-14 DIAGNOSIS — Z86711 Personal history of pulmonary embolism: Secondary | ICD-10-CM | POA: Diagnosis not present

## 2014-06-14 DIAGNOSIS — F411 Generalized anxiety disorder: Secondary | ICD-10-CM | POA: Diagnosis not present

## 2014-06-14 DIAGNOSIS — F1110 Opioid abuse, uncomplicated: Secondary | ICD-10-CM | POA: Diagnosis not present

## 2014-06-14 DIAGNOSIS — K219 Gastro-esophageal reflux disease without esophagitis: Secondary | ICD-10-CM | POA: Insufficient documentation

## 2014-06-14 DIAGNOSIS — Z79891 Long term (current) use of opiate analgesic: Secondary | ICD-10-CM

## 2014-06-14 DIAGNOSIS — I10 Essential (primary) hypertension: Secondary | ICD-10-CM | POA: Insufficient documentation

## 2014-06-14 DIAGNOSIS — Z8505 Personal history of malignant neoplasm of liver: Secondary | ICD-10-CM | POA: Insufficient documentation

## 2014-06-14 DIAGNOSIS — Z8701 Personal history of pneumonia (recurrent): Secondary | ICD-10-CM | POA: Diagnosis not present

## 2014-06-14 DIAGNOSIS — E538 Deficiency of other specified B group vitamins: Secondary | ICD-10-CM | POA: Diagnosis not present

## 2014-06-14 DIAGNOSIS — J45901 Unspecified asthma with (acute) exacerbation: Secondary | ICD-10-CM

## 2014-06-14 DIAGNOSIS — G43909 Migraine, unspecified, not intractable, without status migrainosus: Secondary | ICD-10-CM | POA: Diagnosis not present

## 2014-06-14 MED ORDER — OXYCODONE HCL 5 MG PO TABS
15.0000 mg | ORAL_TABLET | Freq: Once | ORAL | Status: DC
Start: 2014-06-14 — End: 2014-06-14

## 2014-06-14 NOTE — ED Notes (Addendum)
Pt requesting pain medication (2 day supply) for L wrist and bandage change.  States he was suppose to see orthopedist on Friday but couldn't because he was at the cancer MD all day.
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
## 2014-06-14 NOTE — ED Notes (Signed)
Patient observed leaving room and stating "tell that doctor she can keep that pill", "it ain't gonna do nothin for me", "ya'll have a good night".

## 2014-06-14 NOTE — ED Notes (Addendum)
Urine sample collected.  Awaiting on orders

## 2014-06-14 NOTE — Discharge Instructions (Signed)
Chronic Pain Discharge Instructions  Emergency care providers appreciate that many patients coming to [place] are in severe pain and we wish to address their pain in the safest, most responsible manner.  It is important to recognize however, that the proper treatment of chronic pain differs from that of the pain of injuries and acute illnesses.  Our goal is to provide quality, safe, personalized care and we thank you for giving [place] the opportunity to serve you. The use of narcotics and related agents for chronic pain syndromes may lead to additional physical and psychological problems.  Nearly as many people die from prescription narcotics each year as die from car crashes.  Additionally, this risk is increased if such prescriptions are obtained from a variety of sources.  Therefore, only your primary care physician or a pain management specialist is able to safely treat such syndromes with narcotic medications long-term.    Documentation revealing such prescriptions have been sought from multiple sources may prohibit [place] from providing a refill or different narcotic medication.  Your name may be checked first through the [facility].  This database is a record of controlled substance medication prescriptions that the patient has received.  This has been established by [facility] in an effort to eliminate the dangerous, and often life threatening, practice of obtaining multiple prescriptions from different medical providers.   If you have a chronic pain syndrome (i.e. chronic headaches, recurrent back or neck pain, dental pain, abdominal or pelvis pain without a specific diagnosis, or neuropathic pain such as fibromyalgia) or recurrent visits for the same condition without an acute diagnosis, you may be treated with non-narcotics and other non-addictive medicines.  Allergic reactions or negative side effects that may be reported by a patient to such medications will not  typically lead to the use of a narcotic analgesic or other controlled substance as an alternative.   Patients managing chronic pain with a personal physician should have provisions in place for breakthrough pain.  If you are in crisis, you should call your physician.  If your physician directs you to the emergency department, please have the doctor call and speak to our attending physician concerning your care.   When patients come to the Emergency Department (ED) with acute medical conditions in which the Emergency Department physician feels appropriate to prescribe narcotic or sedating pain medication, the physician will prescribe these in very limited quantities.  The amount of these medications will last only until you can see your primary care physician in his/her office.  Any patient who returns to the ED seeking refills should expect only non-narcotic pain medications.   In the event of an acute medical condition exists and the emergency physician feels it is necessary that the patient be given a narcotic or sedating medication -  a responsible adult driver should be present in the room prior to the medication being given by the nurse.   Prescriptions for narcotic or sedating medications that have been lost, stolen or expired will not be refilled in the Emergency Department.    Patients who have chronic pain may receive non-narcotic prescriptions until seen by their primary care physician.  It is every patients personal responsibility to maintain active prescriptions with his or her primary care physician or specialist.

## 2014-07-12 ENCOUNTER — Encounter (HOSPITAL_COMMUNITY): Payer: Self-pay | Admitting: Emergency Medicine

## 2014-07-12 ENCOUNTER — Emergency Department (HOSPITAL_COMMUNITY): Payer: Medicaid Other

## 2014-07-12 ENCOUNTER — Emergency Department (HOSPITAL_COMMUNITY)
Admission: EM | Admit: 2014-07-12 | Discharge: 2014-07-12 | Disposition: A | Discharge status: Home / Self Care | Payer: Medicaid Other | Attending: Emergency Medicine | Admitting: Emergency Medicine

## 2014-07-12 DIAGNOSIS — R079 Chest pain, unspecified: Secondary | ICD-10-CM | POA: Insufficient documentation

## 2014-07-12 DIAGNOSIS — Z9981 Dependence on supplemental oxygen: Secondary | ICD-10-CM | POA: Diagnosis not present

## 2014-07-12 DIAGNOSIS — Z880 Allergy status to penicillin: Secondary | ICD-10-CM | POA: Insufficient documentation

## 2014-07-12 DIAGNOSIS — G8929 Other chronic pain: Secondary | ICD-10-CM | POA: Insufficient documentation

## 2014-07-12 DIAGNOSIS — G43909 Migraine, unspecified, not intractable, without status migrainosus: Secondary | ICD-10-CM | POA: Insufficient documentation

## 2014-07-12 DIAGNOSIS — Z86718 Personal history of other venous thrombosis and embolism: Secondary | ICD-10-CM | POA: Diagnosis not present

## 2014-07-12 DIAGNOSIS — Z720 Tobacco use: Secondary | ICD-10-CM | POA: Diagnosis not present

## 2014-07-12 DIAGNOSIS — M1990 Unspecified osteoarthritis, unspecified site: Secondary | ICD-10-CM | POA: Insufficient documentation

## 2014-07-12 DIAGNOSIS — I10 Essential (primary) hypertension: Secondary | ICD-10-CM | POA: Insufficient documentation

## 2014-07-12 DIAGNOSIS — Z79899 Other long term (current) drug therapy: Secondary | ICD-10-CM | POA: Diagnosis not present

## 2014-07-12 DIAGNOSIS — J449 Chronic obstructive pulmonary disease, unspecified: Secondary | ICD-10-CM | POA: Insufficient documentation

## 2014-07-12 DIAGNOSIS — Z8673 Personal history of transient ischemic attack (TIA), and cerebral infarction without residual deficits: Secondary | ICD-10-CM | POA: Diagnosis not present

## 2014-07-12 DIAGNOSIS — Z8701 Personal history of pneumonia (recurrent): Secondary | ICD-10-CM | POA: Diagnosis not present

## 2014-07-12 DIAGNOSIS — K219 Gastro-esophageal reflux disease without esophagitis: Secondary | ICD-10-CM | POA: Diagnosis not present

## 2014-07-12 DIAGNOSIS — Z85118 Personal history of other malignant neoplasm of bronchus and lung: Secondary | ICD-10-CM | POA: Diagnosis not present

## 2014-07-12 DIAGNOSIS — Z8619 Personal history of other infectious and parasitic diseases: Secondary | ICD-10-CM | POA: Insufficient documentation

## 2014-07-12 DIAGNOSIS — Z8505 Personal history of malignant neoplasm of liver: Secondary | ICD-10-CM | POA: Diagnosis not present

## 2014-07-12 DIAGNOSIS — F419 Anxiety disorder, unspecified: Secondary | ICD-10-CM | POA: Diagnosis not present

## 2014-07-12 DIAGNOSIS — Z7901 Long term (current) use of anticoagulants: Secondary | ICD-10-CM | POA: Insufficient documentation

## 2014-07-12 DIAGNOSIS — I252 Old myocardial infarction: Secondary | ICD-10-CM | POA: Insufficient documentation

## 2014-07-12 DIAGNOSIS — E039 Hypothyroidism, unspecified: Secondary | ICD-10-CM | POA: Diagnosis not present

## 2014-07-12 DIAGNOSIS — F329 Major depressive disorder, single episode, unspecified: Secondary | ICD-10-CM | POA: Insufficient documentation

## 2014-07-12 LAB — PROTIME-INR
INR: 0.97 (ref 0.00–1.49)
Prothrombin Time: 12.9 seconds (ref 11.6–15.2)

## 2014-07-12 LAB — COMPREHENSIVE METABOLIC PANEL
ALBUMIN: 3.4 g/dL — AB (ref 3.5–5.2)
ALK PHOS: 76 U/L (ref 39–117)
ALT: 32 U/L (ref 0–53)
AST: 33 U/L (ref 0–37)
Anion gap: 14 (ref 5–15)
BUN: 9 mg/dL (ref 6–23)
CO2: 24 mEq/L (ref 19–32)
Calcium: 8.7 mg/dL (ref 8.4–10.5)
Chloride: 99 mEq/L (ref 96–112)
Creatinine, Ser: 0.88 mg/dL (ref 0.50–1.35)
GFR calc Af Amer: >90 mL/min (ref >90)
GFR calc non Af Amer: >90 mL/min (ref >90)
Glucose, Bld: 81 mg/dL (ref 70–99)
POTASSIUM: 4.5 meq/L (ref 3.7–5.3)
SODIUM: 137 meq/L (ref 137–147)
TOTAL PROTEIN: 7.6 g/dL (ref 6.0–8.3)
Total Bilirubin: <0.2 mg/dL — ABNORMAL LOW (ref 0.3–1.2)

## 2014-07-12 LAB — PRO B NATRIURETIC PEPTIDE: PRO B NATRI PEPTIDE: 412.1 pg/mL — AB (ref 0–125)

## 2014-07-12 LAB — CBC WITH DIFFERENTIAL/PLATELET
BASOS PCT: 1 % (ref 0–1)
Basophils Absolute: 0.1 10*3/uL (ref 0.0–0.1)
Eosinophils Absolute: 0.5 10*3/uL (ref 0.0–0.7)
Eosinophils Relative: 4 % (ref 0–5)
HCT: 42.1 % (ref 39.0–52.0)
Hemoglobin: 14.2 g/dL (ref 13.0–17.0)
Lymphocytes Relative: 21 % (ref 12–46)
Lymphs Abs: 2.3 10*3/uL (ref 0.7–4.0)
MCH: 30.9 pg (ref 26.0–34.0)
MCHC: 33.7 g/dL (ref 30.0–36.0)
MCV: 91.7 fL (ref 78.0–100.0)
Monocytes Absolute: 0.9 10*3/uL (ref 0.1–1.0)
Monocytes Relative: 8 % (ref 3–12)
NEUTROS PCT: 66 % (ref 43–77)
Neutro Abs: 7.3 10*3/uL (ref 1.7–7.7)
PLATELETS: 288 10*3/uL (ref 150–400)
RBC: 4.59 MIL/uL (ref 4.22–5.81)
RDW: 15.7 % — ABNORMAL HIGH (ref 11.5–15.5)
WBC: 11.1 10*3/uL — ABNORMAL HIGH (ref 4.0–10.5)

## 2014-07-12 LAB — I-STAT TROPONIN, ED: TROPONIN I, POC: 0 ng/mL (ref 0.00–0.08)

## 2014-07-12 MED ORDER — OXYCODONE-ACETAMINOPHEN 5-325 MG PO TABS
2.0000 | ORAL_TABLET | Freq: Once | ORAL | Status: AC
  Administered 2014-07-12: 2 via ORAL
  Filled 2014-07-12: qty 2

## 2014-07-12 NOTE — ED Notes (Signed)
PA at bedside.

## 2014-07-12 NOTE — ED Notes (Signed)
MD at bedside. 

## 2014-07-12 NOTE — ED Notes (Signed)
Pt c/o CP starting last night with radiation to left arm with leg cramping; pt sts decreased urinary output and is on home O2

## 2014-07-12 NOTE — Discharge Instructions (Signed)
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
## 2014-07-14 NOTE — ED Provider Notes (Signed)
Medical screening examination/treatment/procedure(s) were conducted as a shared visit with non-physician practitioner(s) and myself.  I personally evaluated the patient during the encounter.   EKG Interpretation   Date/Time:  Monday July 12 2014 11:30:17 EDT Ventricular Rate:  115 PR Interval:  140 QRS Duration: 76 QT Interval:  330 QTC Calculation: 456 R Axis:   80 Text Interpretation:  Sinus tachycardia Minimal voltage criteria for LVH,  may be normal variant `no acute change from prior ecg, except rate faster  Confirmed by Ashok Cordia  MD, Lennette Bihari ([id number]) on 07/12/2014 12:48:42 PM      Pt c/o anterior/midline lower chest pain for past day. Constant. At rest. No exertional cp or discomfort. Pain not pleuritic. No associated sob, nv or diaphoresis. Chest cta. +chest wall tenderness. Ecg, labs. Imaging.   Mirna Mires, MD 07/14/14 1001

## 2014-08-11 ENCOUNTER — Encounter (HOSPITAL_BASED_OUTPATIENT_CLINIC_OR_DEPARTMENT_OTHER): Payer: Self-pay

## 2014-08-11 ENCOUNTER — Emergency Department (HOSPITAL_BASED_OUTPATIENT_CLINIC_OR_DEPARTMENT_OTHER)
Admission: EM | Admit: 2014-08-11 | Discharge: 2014-08-11 | Disposition: A | Discharge status: Home / Self Care | Payer: Medicaid Other | Attending: Emergency Medicine | Admitting: Emergency Medicine

## 2014-08-11 ENCOUNTER — Emergency Department (HOSPITAL_BASED_OUTPATIENT_CLINIC_OR_DEPARTMENT_OTHER): Payer: Medicaid Other

## 2014-08-11 DIAGNOSIS — Z7901 Long term (current) use of anticoagulants: Secondary | ICD-10-CM | POA: Insufficient documentation

## 2014-08-11 DIAGNOSIS — J449 Chronic obstructive pulmonary disease, unspecified: Secondary | ICD-10-CM | POA: Insufficient documentation

## 2014-08-11 DIAGNOSIS — E039 Hypothyroidism, unspecified: Secondary | ICD-10-CM | POA: Insufficient documentation

## 2014-08-11 DIAGNOSIS — Z8673 Personal history of transient ischemic attack (TIA), and cerebral infarction without residual deficits: Secondary | ICD-10-CM | POA: Insufficient documentation

## 2014-08-11 DIAGNOSIS — Z85118 Personal history of other malignant neoplasm of bronchus and lung: Secondary | ICD-10-CM | POA: Insufficient documentation

## 2014-08-11 DIAGNOSIS — R042 Hemoptysis: Secondary | ICD-10-CM | POA: Insufficient documentation

## 2014-08-11 DIAGNOSIS — G8929 Other chronic pain: Secondary | ICD-10-CM | POA: Insufficient documentation

## 2014-08-11 DIAGNOSIS — Z880 Allergy status to penicillin: Secondary | ICD-10-CM | POA: Insufficient documentation

## 2014-08-11 DIAGNOSIS — Z8701 Personal history of pneumonia (recurrent): Secondary | ICD-10-CM | POA: Diagnosis not present

## 2014-08-11 DIAGNOSIS — Z9981 Dependence on supplemental oxygen: Secondary | ICD-10-CM | POA: Insufficient documentation

## 2014-08-11 DIAGNOSIS — F419 Anxiety disorder, unspecified: Secondary | ICD-10-CM | POA: Diagnosis not present

## 2014-08-11 DIAGNOSIS — M1990 Unspecified osteoarthritis, unspecified site: Secondary | ICD-10-CM | POA: Diagnosis not present

## 2014-08-11 DIAGNOSIS — K219 Gastro-esophageal reflux disease without esophagitis: Secondary | ICD-10-CM | POA: Insufficient documentation

## 2014-08-11 DIAGNOSIS — I252 Old myocardial infarction: Secondary | ICD-10-CM | POA: Insufficient documentation

## 2014-08-11 DIAGNOSIS — E538 Deficiency of other specified B group vitamins: Secondary | ICD-10-CM | POA: Insufficient documentation

## 2014-08-11 DIAGNOSIS — Z86718 Personal history of other venous thrombosis and embolism: Secondary | ICD-10-CM | POA: Insufficient documentation

## 2014-08-11 DIAGNOSIS — F329 Major depressive disorder, single episode, unspecified: Secondary | ICD-10-CM | POA: Diagnosis not present

## 2014-08-11 DIAGNOSIS — G43909 Migraine, unspecified, not intractable, without status migrainosus: Secondary | ICD-10-CM | POA: Diagnosis not present

## 2014-08-11 DIAGNOSIS — R059 Cough, unspecified: Secondary | ICD-10-CM

## 2014-08-11 DIAGNOSIS — Z86711 Personal history of pulmonary embolism: Secondary | ICD-10-CM | POA: Diagnosis not present

## 2014-08-11 DIAGNOSIS — Z79899 Other long term (current) drug therapy: Secondary | ICD-10-CM | POA: Insufficient documentation

## 2014-08-11 DIAGNOSIS — I10 Essential (primary) hypertension: Secondary | ICD-10-CM | POA: Insufficient documentation

## 2014-08-11 DIAGNOSIS — Z8619 Personal history of other infectious and parasitic diseases: Secondary | ICD-10-CM | POA: Diagnosis not present

## 2014-08-11 DIAGNOSIS — R05 Cough: Secondary | ICD-10-CM

## 2014-08-11 LAB — CBC WITH DIFFERENTIAL/PLATELET
Basophils Absolute: 0 10*3/uL (ref 0.0–0.1)
Basophils Relative: 0 % (ref 0–1)
EOS PCT: 0 % (ref 0–5)
Eosinophils Absolute: 0 10*3/uL (ref 0.0–0.7)
HCT: 43.9 % (ref 39.0–52.0)
HEMOGLOBIN: 15 g/dL (ref 13.0–17.0)
LYMPHS ABS: 1.2 10*3/uL (ref 0.7–4.0)
LYMPHS PCT: 11 % — AB (ref 12–46)
MCH: 30.9 pg (ref 26.0–34.0)
MCHC: 34.2 g/dL (ref 30.0–36.0)
MCV: 90.3 fL (ref 78.0–100.0)
Monocytes Absolute: 0.8 10*3/uL (ref 0.1–1.0)
Monocytes Relative: 8 % (ref 3–12)
NEUTROS PCT: 81 % — AB (ref 43–77)
Neutro Abs: 8.8 10*3/uL — ABNORMAL HIGH (ref 1.7–7.7)
Platelets: 310 10*3/uL (ref 150–400)
RBC: 4.86 MIL/uL (ref 4.22–5.81)
RDW: 15.4 % (ref 11.5–15.5)
WBC: 10.8 10*3/uL — ABNORMAL HIGH (ref 4.0–10.5)

## 2014-08-11 LAB — COMPREHENSIVE METABOLIC PANEL
ALT: 45 U/L (ref 0–53)
AST: 42 U/L — AB (ref 0–37)
Albumin: 3.8 g/dL (ref 3.5–5.2)
Alkaline Phosphatase: 57 U/L (ref 39–117)
Anion gap: 18 — ABNORMAL HIGH (ref 5–15)
BUN: 12 mg/dL (ref 6–23)
CO2: 20 mEq/L (ref 19–32)
Calcium: 9.7 mg/dL (ref 8.4–10.5)
Chloride: 98 mEq/L (ref 96–112)
Creatinine, Ser: 1 mg/dL (ref 0.50–1.35)
GFR calc Af Amer: >90 mL/min (ref >90)
GFR calc non Af Amer: 84 mL/min — ABNORMAL LOW (ref >90)
Glucose, Bld: 101 mg/dL — ABNORMAL HIGH (ref 70–99)
Potassium: 4 mEq/L (ref 3.7–5.3)
SODIUM: 136 meq/L — AB (ref 137–147)
TOTAL PROTEIN: 8 g/dL (ref 6.0–8.3)
Total Bilirubin: 0.3 mg/dL (ref 0.3–1.2)

## 2014-08-11 MED ORDER — OXYCODONE HCL 5 MG PO TABS: 10.0000 mg | ORAL_TABLET | ORAL | Status: DC | PRN

## 2014-08-11 MED ORDER — HYDROMORPHONE HCL 1 MG/ML IJ SOLN
1.0000 mg | Freq: Once | INTRAMUSCULAR | Status: AC
  Administered 2014-08-11: 1 mg via INTRAMUSCULAR
  Filled 2014-08-11: qty 1
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
## 2014-08-11 NOTE — ED Notes (Addendum)
Pt reports hx of coughing blood-chronic condtion-is being tx for lung CA-states he is here today due to unable to sleep due to cough-pt NAD at this time-pt states he was on Hospice but has quit all cancer treaments

## 2014-08-11 NOTE — Discharge Instructions (Signed)
As discussed, your evaluation today has been largely reassuring.  But, it is important that you monitor your condition carefully, and do not hesitate to return to the ED if you develop new, or concerning changes in your condition.  Otherwise, please follow-up with your physicians and pain management clinic for appropriate ongoing care.

## 2014-08-11 NOTE — ED Provider Notes (Signed)
CSN: [id number]     Arrival date & time 08/11/14  1408 History   First MD Initiated Contact with Patient 08/11/14 1507     Chief Complaint  Patient presents with  . Hemoptysis      HPI  Patient presents with concern of cough, hemoptysis. This episode seems to have begun 2 days ago.  Since onset cough has been persistent, with no relief from medication. No new fever, chills, vomiting, diarrhea. Patient has baseline dyspnea, unchanged. Patient has multiple medical process, most prominently, metastatic cancer for which he has stopped therapeutic treatment. Patient was enrolled in hospice care, but has stopped receiving their services as well. Patient denies current confusion, disorientation, headache, other changes from baseline medical condition. Patient has a unclear story about his access to physicians, medication at this time, but it seems as though the patient has no current medication for pain control.  Patient notes that he has had frequent episodes of hemoptysis with his cancer.   Past Medical History  Diagnosis Date  . Deep vein thrombosis     "several"  . Pulmonary emboli     "several"  . COPD (chronic obstructive pulmonary disease)   . Partial small bowel obstruction   . Myocardial infarction 2007; ~ 2011  . Chronic liver failure   . Shortness of breath     "all the time"  . Oxygen dependent     3L; 24/7" (05/27/2014)  . History of blood transfusion 2013    "related to kidneys shutting down"  . Hepatitis C   . Depression   . Hypertension   . Hypothyroidism   . Asthma   . Pneumonia     "several times"  . GERD (gastroesophageal reflux disease)   . Migraines     "2-3/day" (05/27/2014)  . Stroke ~ 03/2012    "couldn't use my left hand for ~ 6 months" (05/27/2014)  . Stroke 05/25/2014    "not able to use my left hand again" (05/27/2014)  . Arthritis     "I'm eat up w/it"  . Chronic lower back pain   . Anxiety   . Intermittent self-catheterization of bladder   .  Liver cancer   . Metastatic lung cancer     "left"  . B12 deficiency     "give myself shots"   Past Surgical History  Procedure Laterality Date  . Transurethral resection of prostate  2012; 2014  . Tonsillectomy and adenoidectomy  1974  . Knee surgery Left ~ 1972    "cut top of my kneecap off"  . Multiple tooth extractions  11/2010    "26"  . Incision and drainage abscess Left 05/28/2014    Procedure: INCISION AND DRAINAGE ABSCESS LEFT WRIST;  Surgeon: Leanora Cover, MD;  Location: [place];  Service: Orthopedics;  Laterality: Left;   Family History  Problem Relation Age of Onset  . Cancer Mother   . Cancer Paternal Aunt   . Cancer Maternal Aunt    History  Substance Use Topics  . Smoking status: Current Every Day Smoker -- 0.50 packs/day for 41 years    Types: Cigarettes  . Smokeless tobacco: Former Systems developer     Comment: "used chewing tobacco when we were little"  . Alcohol Use: Yes    Review of Systems  Constitutional:       Per HPI, otherwise negative  HENT:       Per HPI, otherwise negative  Respiratory:       Per HPI, otherwise negative  Cardiovascular:       Per HPI, otherwise negative  Gastrointestinal: Negative for vomiting.  Endocrine:       Negative aside from HPI  Genitourinary:       Neg aside from HPI   Musculoskeletal:       Per HPI, otherwise negative  Skin: Negative.   Neurological: Negative for syncope.      Allergies  Penicillins and Morphine and related  Home Medications   Prior to Admission medications   Medication Sig Start Date End Date Taking? Authorizing Provider  albuterol (PROVENTIL HFA;VENTOLIN HFA) 108 (90 BASE) MCG/ACT inhaler Inhale 2 puffs into the lungs every 6 (six) hours as needed for wheezing or shortness of breath.    Historical Provider, MD  butalbital-acetaminophen-caffeine (ESGIC PLUS) 50-500-40 MG per tablet Take 1 tablet by mouth every 4 (four) hours as needed for pain.    Historical Provider, MD  cyanocobalamin (,VITAMIN  B-12,) 1000 MCG/ML injection Inject 1,000 mcg into the muscle once a week. On monday    Historical Provider, MD  diazepam (VALIUM) 10 MG tablet Take 1 tablet (10 mg total) by mouth daily. 06/07/14   Veryl Speak, MD  DULoxetine (CYMBALTA) 60 MG capsule Take 1 capsule (60 mg total) by mouth 2 (two) times daily. 06/07/14   Veryl Speak, MD  gabapentin (NEURONTIN) 400 MG capsule Take 1 capsule (400 mg total) by mouth 3 (three) times daily. 06/07/14   Veryl Speak, MD  Ipratropium-Albuterol (COMBIVENT RESPIMAT) 20-100 MCG/ACT AERS respimat Inhale 1 puff into the lungs daily.    Historical Provider, MD  levothyroxine (SYNTHROID, LEVOTHROID) 50 MCG tablet Take 1 tablet (50 mcg total) by mouth daily before breakfast. 06/07/14   Veryl Speak, MD  Multiple Vitamins-Minerals (MULTIVITAMIN WITH MINERALS) tablet Take 1 tablet by mouth daily.    Historical Provider, MD  omeprazole (PRILOSEC) 20 MG capsule Take 1 capsule (20 mg total) by mouth 2 (two) times daily. 06/07/14   Veryl Speak, MD  oxyCODONE (OXY IR/ROXICODONE) 5 MG immediate release tablet Take 10 mg by mouth every 4 (four) hours as needed for severe pain.     Historical Provider, MD  potassium chloride SA (K-DUR,KLOR-CON) 20 MEQ tablet Take 1 tablet (20 mEq total) by mouth at bedtime. 06/07/14   Veryl Speak, MD  promethazine (PHENERGAN) 25 MG tablet Take 1 tablet (25 mg total) by mouth 2 (two) times daily. 06/07/14   Veryl Speak, MD  Rivaroxaban (XARELTO) 15 MG TABS tablet Take 15 mg by mouth 2 (two) times daily with a meal.    Historical Provider, MD  tamsulosin (FLOMAX) 0.4 MG CAPS capsule Take 1 capsule (0.4 mg total) by mouth at bedtime. 06/07/14   Veryl Speak, MD   BP 118/78 mmHg  Pulse 84  Temp(Src) 98.1 F (36.7 C) (Oral)  Resp 18  SpO2 100% Physical Exam  Constitutional: He is oriented to person, place, and time. He appears cachectic. He has a sickly appearance.  HENT:  Head: Normocephalic and atraumatic.  Eyes: Conjunctivae and EOM are normal.   Cardiovascular: Normal rate and regular rhythm.   Pulmonary/Chest: Effort normal. No stridor. He has decreased breath sounds.  Nasal cannula oxygen in place  Abdominal: He exhibits no distension.  Musculoskeletal: He exhibits no edema.  Neurological: He is alert and oriented to person, place, and time.  Skin: Skin is warm and dry.  Psychiatric: He has a normal mood and affect.  Nursing note and vitals reviewed.   ED Course  Procedures (including critical care time)  Labs Review Labs Reviewed  COMPREHENSIVE METABOLIC PANEL  CBC WITH DIFFERENTIAL    Imaging Review I reviewed the x-ray, agree with the interpretation.   Pulse oximetry 99% with oxygen, abnormal  I reviewed the patient's electronic medical record, including recent hospitalization for cellulitis.  On repeat: patient in no distress MDM   Final diagnoses:  Hemoptysis  Cough    Patient with lung cancer, no longer a candidate for clinical interventions now presents with cough, hemoptysis. Description description of these as being typical for him, but also occurring frequently with pneumonia is reassuring.  Evaluation today does not suggest pneumonia. Patient has no evidence for new PE, given his typical oxygen requirement, with typical oxygen saturation, the absence of tachypnea, tachycardia or distress. Patient states that he is compliant with anticoagulant. Patient was provided additional referral for new oncologic evaluation, per his request. Discharged in stable condition.    Carmin Muskrat, MD 08/11/14 [phone]

## 2014-08-17 ENCOUNTER — Telehealth: Payer: Self-pay | Admitting: Internal Medicine
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
## 2014-08-18 ENCOUNTER — Encounter (HOSPITAL_BASED_OUTPATIENT_CLINIC_OR_DEPARTMENT_OTHER): Payer: Self-pay | Admitting: Emergency Medicine

## 2014-08-18 ENCOUNTER — Emergency Department (HOSPITAL_BASED_OUTPATIENT_CLINIC_OR_DEPARTMENT_OTHER)
Admission: EM | Admit: 2014-08-18 | Discharge: 2014-08-18 | Disposition: A | Discharge status: Home / Self Care | Payer: Medicaid Other | Attending: Emergency Medicine | Admitting: Emergency Medicine

## 2014-08-18 ENCOUNTER — Emergency Department (HOSPITAL_BASED_OUTPATIENT_CLINIC_OR_DEPARTMENT_OTHER): Payer: Medicaid Other

## 2014-08-18 DIAGNOSIS — R05 Cough: Secondary | ICD-10-CM | POA: Insufficient documentation

## 2014-08-18 DIAGNOSIS — Z86718 Personal history of other venous thrombosis and embolism: Secondary | ICD-10-CM | POA: Diagnosis not present

## 2014-08-18 DIAGNOSIS — Z8505 Personal history of malignant neoplasm of liver: Secondary | ICD-10-CM | POA: Diagnosis not present

## 2014-08-18 DIAGNOSIS — Z85118 Personal history of other malignant neoplasm of bronchus and lung: Secondary | ICD-10-CM | POA: Diagnosis not present

## 2014-08-18 DIAGNOSIS — Z880 Allergy status to penicillin: Secondary | ICD-10-CM | POA: Diagnosis not present

## 2014-08-18 DIAGNOSIS — E538 Deficiency of other specified B group vitamins: Secondary | ICD-10-CM | POA: Diagnosis not present

## 2014-08-18 DIAGNOSIS — Z7901 Long term (current) use of anticoagulants: Secondary | ICD-10-CM | POA: Diagnosis not present

## 2014-08-18 DIAGNOSIS — E039 Hypothyroidism, unspecified: Secondary | ICD-10-CM | POA: Diagnosis not present

## 2014-08-18 DIAGNOSIS — G43909 Migraine, unspecified, not intractable, without status migrainosus: Secondary | ICD-10-CM | POA: Diagnosis not present

## 2014-08-18 DIAGNOSIS — G8929 Other chronic pain: Secondary | ICD-10-CM | POA: Diagnosis not present

## 2014-08-18 DIAGNOSIS — I252 Old myocardial infarction: Secondary | ICD-10-CM | POA: Diagnosis not present

## 2014-08-18 DIAGNOSIS — Z720 Tobacco use: Secondary | ICD-10-CM | POA: Diagnosis not present

## 2014-08-18 DIAGNOSIS — F329 Major depressive disorder, single episode, unspecified: Secondary | ICD-10-CM | POA: Diagnosis not present

## 2014-08-18 DIAGNOSIS — F419 Anxiety disorder, unspecified: Secondary | ICD-10-CM | POA: Diagnosis not present

## 2014-08-18 DIAGNOSIS — Z9981 Dependence on supplemental oxygen: Secondary | ICD-10-CM | POA: Insufficient documentation

## 2014-08-18 DIAGNOSIS — Z86711 Personal history of pulmonary embolism: Secondary | ICD-10-CM | POA: Insufficient documentation

## 2014-08-18 DIAGNOSIS — R059 Cough, unspecified: Secondary | ICD-10-CM

## 2014-08-18 DIAGNOSIS — K219 Gastro-esophageal reflux disease without esophagitis: Secondary | ICD-10-CM | POA: Diagnosis not present

## 2014-08-18 DIAGNOSIS — M1990 Unspecified osteoarthritis, unspecified site: Secondary | ICD-10-CM | POA: Insufficient documentation

## 2014-08-18 DIAGNOSIS — Z8619 Personal history of other infectious and parasitic diseases: Secondary | ICD-10-CM | POA: Insufficient documentation

## 2014-08-18 DIAGNOSIS — Z8673 Personal history of transient ischemic attack (TIA), and cerebral infarction without residual deficits: Secondary | ICD-10-CM | POA: Diagnosis not present

## 2014-08-18 DIAGNOSIS — Z79899 Other long term (current) drug therapy: Secondary | ICD-10-CM | POA: Diagnosis not present

## 2014-08-18 DIAGNOSIS — J441 Chronic obstructive pulmonary disease with (acute) exacerbation: Secondary | ICD-10-CM | POA: Insufficient documentation

## 2014-08-18 DIAGNOSIS — D649 Anemia, unspecified: Secondary | ICD-10-CM | POA: Diagnosis not present

## 2014-08-18 DIAGNOSIS — I10 Essential (primary) hypertension: Secondary | ICD-10-CM | POA: Insufficient documentation

## 2014-08-18 DIAGNOSIS — Z8701 Personal history of pneumonia (recurrent): Secondary | ICD-10-CM | POA: Diagnosis not present

## 2014-08-18 LAB — CBC WITH DIFFERENTIAL/PLATELET
BAND NEUTROPHILS: 2 % (ref 0–10)
BASOS ABS: 0 10*3/uL (ref 0.0–0.1)
Basophils Relative: 0 % (ref 0–1)
EOS PCT: 0 % (ref 0–5)
Eosinophils Absolute: 0 10*3/uL (ref 0.0–0.7)
HEMATOCRIT: 34.8 % — AB (ref 39.0–52.0)
Hemoglobin: 11.6 g/dL — ABNORMAL LOW (ref 13.0–17.0)
Lymphocytes Relative: 22 % (ref 12–46)
Lymphs Abs: 2.3 10*3/uL (ref 0.7–4.0)
MCH: 30.4 pg (ref 26.0–34.0)
MCHC: 33.3 g/dL (ref 30.0–36.0)
MCV: 91.1 fL (ref 78.0–100.0)
MONOS PCT: 10 % (ref 3–12)
Monocytes Absolute: 1 10*3/uL (ref 0.1–1.0)
NEUTROS ABS: 7 10*3/uL (ref 1.7–7.7)
Neutrophils Relative %: 66 % (ref 43–77)
Platelets: 439 10*3/uL — ABNORMAL HIGH (ref 150–400)
RBC: 3.82 MIL/uL — AB (ref 4.22–5.81)
RDW: 15.3 % (ref 11.5–15.5)
WBC: 10.3 10*3/uL (ref 4.0–10.5)

## 2014-08-18 LAB — BASIC METABOLIC PANEL
Anion gap: 16 — ABNORMAL HIGH (ref 5–15)
BUN: 10 mg/dL (ref 6–23)
CALCIUM: 8.8 mg/dL (ref 8.4–10.5)
CHLORIDE: 105 meq/L (ref 96–112)
CO2: 21 meq/L (ref 19–32)
Creatinine, Ser: 0.8 mg/dL (ref 0.50–1.35)
GFR calc Af Amer: >90 mL/min (ref >90)
GFR calc non Af Amer: >90 mL/min (ref >90)
GLUCOSE: 96 mg/dL (ref 70–99)
Potassium: 3.4 mEq/L — ABNORMAL LOW (ref 3.7–5.3)
Sodium: 142 mEq/L (ref 137–147)

## 2014-08-18 LAB — I-STAT CG4 LACTIC ACID, ED: Lactic Acid, Venous: 1.43 mmol/L (ref 0.5–2.2)

## 2014-08-18 MED ORDER — OXYCODONE HCL 5 MG PO TABS: 10.0000 mg | ORAL_TABLET | ORAL | Status: DC | PRN

## 2014-08-18 MED ORDER — BENZONATATE 100 MG PO CAPS: 100.0000 mg | ORAL_CAPSULE | Freq: Three times a day (TID) | ORAL | Status: DC

## 2014-08-18 MED ORDER — HYDROMORPHONE HCL 1 MG/ML IJ SOLN
1.0000 mg | Freq: Once | INTRAMUSCULAR | Status: AC
  Administered 2014-08-18: 1 mg via INTRAVENOUS
  Filled 2014-08-18: qty 1

## 2014-08-18 NOTE — ED Notes (Signed)
Cough that is worse since yesterday.  "I can't get my breath:"

## 2014-08-18 NOTE — ED Notes (Signed)
Cough worse x 2 days  sob

## 2014-08-18 NOTE — ED Provider Notes (Signed)
CSN: [id number]     Arrival date & time 08/18/14  1858 History  This chart was scribed for Ephraim Hamburger, MD by Tula Nakayama, ED Scribe. This patient was seen in room MH01/MH01 and the patient's care was started at 7:28 PM.    Chief Complaint  Patient presents with  . Cough   The history is provided by the patient. No language interpreter was used.    HPI Comments: Robert [name] is a 53 y.o. male with lung cancer who presents to the Emergency Department complaining of gradually worsening SOB and productive cough with brown sputum that started 9 days ago and became worse yesterday. Pt states chest wall pain that radiates to his shoulders, fever, chills, nasal congestion and migraines as associated symptoms. He notes he has a history of similar symptoms when he was diagnosed with pneumonia. He was seen in the ED 1 week ago, had a negative evaluation for pneumonia, and was diagnosed with hemoptysis and cough. Pt also has history of similar migraines, but notes pain is worse than normal today.   Past Medical History  Diagnosis Date  . Deep vein thrombosis     "several"  . Pulmonary emboli     "several"  . COPD (chronic obstructive pulmonary disease)   . Partial small bowel obstruction   . Myocardial infarction 2007; ~ 2011  . Chronic liver failure   . Shortness of breath     "all the time"  . Oxygen dependent     3L; 24/7" (05/27/2014)  . History of blood transfusion 2013    "related to kidneys shutting down"  . Hepatitis C   . Depression   . Hypertension   . Hypothyroidism   . Asthma   . Pneumonia     "several times"  . GERD (gastroesophageal reflux disease)   . Migraines     "2-3/day" (05/27/2014)  . Stroke ~ 03/2012    "couldn't use my left hand for ~ 6 months" (05/27/2014)  . Stroke 05/25/2014    "not able to use my left hand again" (05/27/2014)  . Arthritis     "I'm eat up w/it"  . Chronic lower back pain   . Anxiety   . Intermittent self-catheterization of  bladder   . Liver cancer   . Metastatic lung cancer     "left"  . B12 deficiency     "give myself shots"   Past Surgical History  Procedure Laterality Date  . Transurethral resection of prostate  2012; 2014  . Tonsillectomy and adenoidectomy  1974  . Knee surgery Left ~ 1972    "cut top of my kneecap off"  . Multiple tooth extractions  11/2010    "26"  . Incision and drainage abscess Left 05/28/2014    Procedure: INCISION AND DRAINAGE ABSCESS LEFT WRIST;  Surgeon: Leanora Cover, MD;  Location: [facility];  Service: Orthopedics;  Laterality: Left;   Family History  Problem Relation Age of Onset  . Cancer Mother   . Cancer Paternal Aunt   . Cancer Maternal Aunt    History  Substance Use Topics  . Smoking status: Current Every Day Smoker -- 0.50 packs/day for 41 years    Types: Cigarettes  . Smokeless tobacco: Former Systems developer     Comment: "used chewing tobacco when we were little"  . Alcohol Use: Yes    Review of Systems  Constitutional: Positive for fever and chills.  HENT: Positive for congestion.   Respiratory: Positive for cough and shortness  of breath.   Cardiovascular: Positive for chest pain.  Neurological: Positive for headaches.  All other systems reviewed and are negative.   Allergies  Penicillins and Morphine and related  Home Medications   Prior to Admission medications   Medication Sig Start Date End Date Taking? Authorizing Provider  albuterol (PROVENTIL HFA;VENTOLIN HFA) 108 (90 BASE) MCG/ACT inhaler Inhale 2 puffs into the lungs every 6 (six) hours as needed for wheezing or shortness of breath.    Historical Provider, MD  butalbital-acetaminophen-caffeine (ESGIC PLUS) 50-500-40 MG per tablet Take 1 tablet by mouth every 4 (four) hours as needed for pain.    Historical Provider, MD  cyanocobalamin (,VITAMIN B-12,) 1000 MCG/ML injection Inject 1,000 mcg into the muscle once a week. On monday    Historical Provider, MD  diazepam (VALIUM) 10 MG tablet Take 1 tablet (10  mg total) by mouth daily. 06/07/14   Veryl Speak, MD  DULoxetine (CYMBALTA) 60 MG capsule Take 1 capsule (60 mg total) by mouth 2 (two) times daily. 06/07/14   Veryl Speak, MD  gabapentin (NEURONTIN) 400 MG capsule Take 1 capsule (400 mg total) by mouth 3 (three) times daily. 06/07/14   Veryl Speak, MD  Ipratropium-Albuterol (COMBIVENT RESPIMAT) 20-100 MCG/ACT AERS respimat Inhale 1 puff into the lungs daily.    Historical Provider, MD  levothyroxine (SYNTHROID, LEVOTHROID) 50 MCG tablet Take 1 tablet (50 mcg total) by mouth daily before breakfast. 06/07/14   Veryl Speak, MD  Multiple Vitamins-Minerals (MULTIVITAMIN WITH MINERALS) tablet Take 1 tablet by mouth daily.    Historical Provider, MD  omeprazole (PRILOSEC) 20 MG capsule Take 1 capsule (20 mg total) by mouth 2 (two) times daily. 06/07/14   Veryl Speak, MD  oxyCODONE (OXY IR/ROXICODONE) 5 MG immediate release tablet Take 2 tablets (10 mg total) by mouth every 4 (four) hours as needed for severe pain. 08/11/14   Carmin Muskrat, MD  potassium chloride SA (K-DUR,KLOR-CON) 20 MEQ tablet Take 1 tablet (20 mEq total) by mouth at bedtime. 06/07/14   Veryl Speak, MD  promethazine (PHENERGAN) 25 MG tablet Take 1 tablet (25 mg total) by mouth 2 (two) times daily. 06/07/14   Veryl Speak, MD  Rivaroxaban (XARELTO) 15 MG TABS tablet Take 15 mg by mouth 2 (two) times daily with a meal.    Historical Provider, MD  tamsulosin (FLOMAX) 0.4 MG CAPS capsule Take 1 capsule (0.4 mg total) by mouth at bedtime. 06/07/14   Veryl Speak, MD   BP 143/99 mmHg  Pulse 98  Temp(Src) 98 F (36.7 C) (Oral)  Resp 18  Ht 5\' 7"  (1.702 m)  Wt 160 lb (72.576 kg)  BMI 25.05 kg/m2  SpO2 100% Physical Exam  Constitutional: He appears well-developed and well-nourished. No distress.  HENT:  Head: Normocephalic and atraumatic.  Eyes: Conjunctivae and EOM are normal.  Neck: Neck supple. No tracheal deviation present.  Cardiovascular: Normal rate.   Pulmonary/Chest: Effort normal.  No respiratory distress. He has no wheezes. He has no rales. He exhibits tenderness.  Bilateral anterior chest wall tenderness  Skin: Skin is warm and dry.  Psychiatric: He has a normal mood and affect. His behavior is normal.  Nursing note and vitals reviewed.   ED Course  Procedures (including critical care time) DIAGNOSTIC STUDIES: Oxygen Saturation is 100% on 2.5 L [place], normal by my interpretation.    COORDINATION OF CARE: 7:40 PM Discussed treatment plan which includes chest x-ray, EKG and labwork and pt agreed to plan.  Labs Review Labs Reviewed  CBC WITH DIFFERENTIAL - Abnormal; Notable for the following:    RBC 3.82 (*)    Hemoglobin 11.6 (*)    HCT 34.8 (*)    Platelets 439 (*)    All other components within normal limits  BASIC METABOLIC PANEL - Abnormal; Notable for the following:    Potassium 3.4 (*)    Anion gap 16 (*)    All other components within normal limits  I-STAT CG4 LACTIC ACID, ED    Imaging Review Dg Chest 2 View  08/18/2014   CLINICAL DATA:  Subsequent evaluation for productive cough for 9 days with chest pain, personal history of lung cancer  EXAM: CHEST  2 VIEW  COMPARISON:  Multiple prior studies the most recent being 08/11/2014  FINDINGS: Stable 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 Heart size and vascular pattern are normal. No infiltrate consolidation or effusion. Stable mild scarring right middle lobe when compared to numerous prior studies.  IMPRESSION: No acute findings.   Electronically Signed   By: Skipper Cliche M.D.   On: 08/18/2014 20:53     EKG Interpretation   Date/Time:  Wednesday August 18 2014 19:44:40 EST Ventricular Rate:  81 PR Interval:  128 QRS Duration: 92 QT Interval:  400 QTC Calculation: 464 R Axis:   44 Text Interpretation:  Normal sinus rhythm Normal ECG No significant change  since last tracing Confirmed by Reda Gettis  MD, Annikah Lovins ([id number]) on 08/18/2014  7:50:47 PM      MDM   Final diagnoses:  Cough   Anemia, unspecified anemia type    Patient with acute on chronic cough. No hemoptysis, just brown sputum. No hypoxia, tachycardia or new CP to suggest concern for new PE. No PNA on chest xray. After history and exam, I believe his primary goal is repeat pain medicines. He is otherwise chronically ill but shows no acute illness that would require hospitalization. Will d/c with pain and antitussive meds.   I personally performed the services described in this documentation, which was scribed in my presence. The recorded information has been reviewed and is accurate.    Ephraim Hamburger, MD 08/19/14 [phone]

## 2014-08-18 NOTE — Discharge Instructions (Signed)
Cough, Adult  A cough is a reflex that helps clear your throat and airways. It can help heal the body or may be a reaction to an irritated airway. A cough may only last 2 or 3 weeks (acute) or may last more than 8 weeks (chronic).  CAUSES Acute cough:  Viral or bacterial infections. Chronic cough:  Infections.  Allergies.  Asthma.  Post-nasal drip.  Smoking.  Heartburn or acid reflux.  Some medicines.  Chronic lung problems (COPD).  Cancer. SYMPTOMS   Cough.  Fever.  Chest pain.  Increased breathing rate.  High-pitched whistling sound when breathing (wheezing).  Colored mucus that you cough up (sputum). TREATMENT   A bacterial cough may be treated with antibiotic medicine.  A viral cough must run its course and will not respond to antibiotics.  Your caregiver may recommend other treatments if you have a chronic cough. HOME CARE INSTRUCTIONS   Only take over-the-counter or prescription medicines for pain, discomfort, or fever as directed by your caregiver. Use cough suppressants only as directed by your caregiver.  Use a cold steam vaporizer or humidifier in your bedroom or home to help loosen secretions.  Sleep in a semi-upright position if your cough is worse at night.  Rest as needed.  Stop smoking if you smoke. SEEK IMMEDIATE MEDICAL CARE IF:   You have pus in your sputum.  Your cough starts to worsen.  You cannot control your cough with suppressants and are losing sleep.  You begin coughing up blood.  You have difficulty breathing.  You develop pain which is getting worse or is uncontrolled with medicine.  You have a fever. MAKE SURE YOU:   Understand these instructions.  Will watch your condition.  Will get help right away if you are not doing well or get worse. Document Released: 03/16/2011 Document Revised: 12/10/2011 Document Reviewed: 03/16/2011 [facility] Patient Information 2015 [place], [place]. This information is not intended  to replace advice given to you by your health care provider. Make sure you discuss any questions you have with your health care provider.     Anemia, Nonspecific Anemia is a condition in which the concentration of red blood cells or hemoglobin in the blood is below normal. Hemoglobin is a substance in red blood cells that carries oxygen to the tissues of the body. Anemia results in not enough oxygen reaching these tissues.  CAUSES  Common causes of anemia include:   Excessive bleeding. Bleeding may be internal or external. This includes excessive bleeding from periods (in women) or from the intestine.   Poor nutrition.   Chronic kidney, thyroid, and liver disease.  Bone marrow disorders that decrease red blood cell production.  Cancer and treatments for cancer.  HIV, AIDS, and their treatments.  Spleen problems that increase red blood cell destruction.  Blood disorders.  Excess destruction of red blood cells due to infection, medicines, and autoimmune disorders. SIGNS AND SYMPTOMS   Minor weakness.   Dizziness.   Headache.  Palpitations.   Shortness of breath, especially with exercise.   Paleness.  Cold sensitivity.  Indigestion.  Nausea.  Difficulty sleeping.  Difficulty concentrating. Symptoms may occur suddenly or they may develop slowly.  DIAGNOSIS  Additional blood tests are often needed. These help your health care provider determine the best treatment. Your health care provider will check your stool for blood and look for other causes of blood loss.  TREATMENT  Treatment varies depending on the cause of the anemia. Treatment can include:   Supplements  of iron, vitamin G26, or folic acid.   Hormone medicines.   A blood transfusion. This may be needed if blood loss is severe.   Hospitalization. This may be needed if there is significant continual blood loss.   Dietary changes.  Spleen removal. HOME CARE INSTRUCTIONS Keep all  follow-up appointments. It often takes many weeks to correct anemia, and having your health care provider check on your condition and your response to treatment is very important. SEEK IMMEDIATE MEDICAL CARE IF:   You develop extreme weakness, shortness of breath, or chest pain.   You become dizzy or have trouble concentrating.  You develop heavy vaginal bleeding.   You develop a rash.   You have bloody or black, tarry stools.   You faint.   You vomit up blood.   You vomit repeatedly.   You have abdominal pain.  You have a fever or persistent symptoms for more than 2-3 days.   You have a fever and your symptoms suddenly get worse.   You are dehydrated.  MAKE SURE YOU:  Understand these instructions.  Will watch your condition.  Will get help right away if you are not doing well or get worse. Document Released: 10/25/2004 Document Revised: 05/20/2013 Document Reviewed: 03/13/2013 [facility] Patient Information 2015 [place], [place]. This information is not intended to replace advice given to you by your health care provider. Make sure you discuss any questions you have with your health care provider.

## 2014-08-18 NOTE — Progress Notes (Signed)
RT placed patient on 2 1/2 liters of O2 per patient's home regimen.

## 2014-08-21 ENCOUNTER — Encounter (HOSPITAL_BASED_OUTPATIENT_CLINIC_OR_DEPARTMENT_OTHER): Payer: Self-pay | Admitting: *Deleted

## 2014-08-21 ENCOUNTER — Emergency Department (HOSPITAL_BASED_OUTPATIENT_CLINIC_OR_DEPARTMENT_OTHER)
Admission: EM | Admit: 2014-08-21 | Discharge: 2014-08-21 | Disposition: A | Discharge status: Home / Self Care | Payer: Medicaid Other | Attending: Emergency Medicine | Admitting: Emergency Medicine

## 2014-08-21 DIAGNOSIS — Z7901 Long term (current) use of anticoagulants: Secondary | ICD-10-CM | POA: Diagnosis not present

## 2014-08-21 DIAGNOSIS — Z9981 Dependence on supplemental oxygen: Secondary | ICD-10-CM | POA: Insufficient documentation

## 2014-08-21 DIAGNOSIS — F419 Anxiety disorder, unspecified: Secondary | ICD-10-CM | POA: Insufficient documentation

## 2014-08-21 DIAGNOSIS — I252 Old myocardial infarction: Secondary | ICD-10-CM | POA: Diagnosis not present

## 2014-08-21 DIAGNOSIS — Z8673 Personal history of transient ischemic attack (TIA), and cerebral infarction without residual deficits: Secondary | ICD-10-CM | POA: Insufficient documentation

## 2014-08-21 DIAGNOSIS — Z85118 Personal history of other malignant neoplasm of bronchus and lung: Secondary | ICD-10-CM | POA: Insufficient documentation

## 2014-08-21 DIAGNOSIS — E039 Hypothyroidism, unspecified: Secondary | ICD-10-CM | POA: Diagnosis not present

## 2014-08-21 DIAGNOSIS — Z86711 Personal history of pulmonary embolism: Secondary | ICD-10-CM | POA: Diagnosis not present

## 2014-08-21 DIAGNOSIS — M1990 Unspecified osteoarthritis, unspecified site: Secondary | ICD-10-CM | POA: Diagnosis not present

## 2014-08-21 DIAGNOSIS — I10 Essential (primary) hypertension: Secondary | ICD-10-CM | POA: Diagnosis not present

## 2014-08-21 DIAGNOSIS — G8929 Other chronic pain: Secondary | ICD-10-CM | POA: Diagnosis not present

## 2014-08-21 DIAGNOSIS — Z720 Tobacco use: Secondary | ICD-10-CM | POA: Insufficient documentation

## 2014-08-21 DIAGNOSIS — F329 Major depressive disorder, single episode, unspecified: Secondary | ICD-10-CM | POA: Diagnosis not present

## 2014-08-21 DIAGNOSIS — K219 Gastro-esophageal reflux disease without esophagitis: Secondary | ICD-10-CM | POA: Diagnosis not present

## 2014-08-21 DIAGNOSIS — Z86718 Personal history of other venous thrombosis and embolism: Secondary | ICD-10-CM | POA: Insufficient documentation

## 2014-08-21 DIAGNOSIS — Z8619 Personal history of other infectious and parasitic diseases: Secondary | ICD-10-CM | POA: Diagnosis not present

## 2014-08-21 DIAGNOSIS — J449 Chronic obstructive pulmonary disease, unspecified: Secondary | ICD-10-CM | POA: Diagnosis not present

## 2014-08-21 DIAGNOSIS — E538 Deficiency of other specified B group vitamins: Secondary | ICD-10-CM | POA: Insufficient documentation

## 2014-08-21 DIAGNOSIS — Z880 Allergy status to penicillin: Secondary | ICD-10-CM | POA: Insufficient documentation

## 2014-08-21 DIAGNOSIS — Z79899 Other long term (current) drug therapy: Secondary | ICD-10-CM | POA: Diagnosis not present

## 2014-08-21 DIAGNOSIS — Z8505 Personal history of malignant neoplasm of liver: Secondary | ICD-10-CM | POA: Diagnosis not present

## 2014-08-21 DIAGNOSIS — G43909 Migraine, unspecified, not intractable, without status migrainosus: Secondary | ICD-10-CM | POA: Insufficient documentation

## 2014-08-21 DIAGNOSIS — Z8701 Personal history of pneumonia (recurrent): Secondary | ICD-10-CM | POA: Insufficient documentation

## 2014-08-21 DIAGNOSIS — R1011 Right upper quadrant pain: Secondary | ICD-10-CM | POA: Insufficient documentation

## 2014-08-21 MED ORDER — OXYCODONE HCL 10 MG PO TABS: 10.0000 mg | ORAL_TABLET | Freq: Four times a day (QID) | ORAL | Status: DC | PRN

## 2014-08-21 MED ORDER — HYDROMORPHONE HCL 1 MG/ML IJ SOLN
2.0000 mg | Freq: Once | INTRAMUSCULAR | Status: AC
  Administered 2014-08-21: 2 mg via INTRAMUSCULAR
  Filled 2014-08-21: qty 2

## 2014-08-21 NOTE — ED Notes (Signed)
Pt c/o abd swelling, abd pain and difficulty urinating- has hx of liver CA and is home O2- seen here for same on 11/17

## 2014-08-21 NOTE — ED Provider Notes (Signed)
CSN: [id number]     Arrival date & time 08/21/14  1314 History   First MD Initiated Contact with Patient 08/21/14 1343     Chief Complaint  Patient presents with  . Abdominal Pain     (Consider location/radiation/quality/duration/timing/severity/associated sxs/prior Treatment) HPI Comments: Patient with a significant history of PE, DVT, COPD, cirrhosis, liver cancer with lung metastases who is exhausted chemotherapy and radiation treatments who presents with recurrent abdominal pain and persistent cough. He has been having difficulty obtaining his pain medication because his primary physician had quit. He is scheduled to see his oncologist on Tuesday but ran out of pain medicine yesterday and states all of his chronic pain symptoms are returning. He took 3 BC powders this morning which states helped his pain somewhat but because of his prior GERD and GI bleeding he knows he is not supposed to take that medication. He also has an appointment with pain management on December 14. He is requesting pain medication to get him through until Tuesday.  He denies any new symptoms from his chronic issues  Patient is a 53 y.o. male presenting with abdominal pain. The history is provided by the patient.  Abdominal Pain Pain location:  RUQ and suprapubic Pain quality: aching, sharp and stabbing   Pain radiates to:  Does not radiate Pain severity:  Severe Onset quality:  Gradual Duration: chronic. Timing:  Constant Progression:  Worsening Chronicity:  Chronic Context comment:  Pain is worsening since he ran out of pain meds yesterday Associated symptoms: cough   Associated symptoms: no chest pain, no chills, no constipation, no diarrhea, no fever, no nausea and no vomiting   Risk factors comment:  On xarelto for PE's   Past Medical History  Diagnosis Date  . Deep vein thrombosis     "several"  . Pulmonary emboli     "several"  . COPD (chronic obstructive pulmonary disease)   . Partial small  bowel obstruction   . Myocardial infarction 2007; ~ 2011  . Chronic liver failure   . Shortness of breath     "all the time"  . Oxygen dependent     3L; 24/7" (05/27/2014)  . History of blood transfusion 2013    "related to kidneys shutting down"  . Hepatitis C   . Depression   . Hypertension   . Hypothyroidism   . Asthma   . Pneumonia     "several times"  . GERD (gastroesophageal reflux disease)   . Migraines     "2-3/day" (05/27/2014)  . Stroke ~ 03/2012    "couldn't use my left hand for ~ 6 months" (05/27/2014)  . Stroke 05/25/2014    "not able to use my left hand again" (05/27/2014)  . Arthritis     "I'm eat up w/it"  . Chronic lower back pain   . Anxiety   . Intermittent self-catheterization of bladder   . Liver cancer   . Metastatic lung cancer     "left"  . B12 deficiency     "give myself shots"   Past Surgical History  Procedure Laterality Date  . Transurethral resection of prostate  2012; 2014  . Tonsillectomy and adenoidectomy  1974  . Knee surgery Left ~ 1972    "cut top of my kneecap off"  . Multiple tooth extractions  11/2010    "26"  . Incision and drainage abscess Left 05/28/2014    Procedure: INCISION AND DRAINAGE ABSCESS LEFT WRIST;  Surgeon: Leanora Cover, MD;  Location: [facility];  Service: Orthopedics;  Laterality: Left;   Family History  Problem Relation Age of Onset  . Cancer Mother   . Cancer Paternal Aunt   . Cancer Maternal Aunt    History  Substance Use Topics  . Smoking status: Current Every Day Smoker -- 0.50 packs/day for 41 years    Types: Cigarettes  . Smokeless tobacco: Former Systems developer     Comment: "used chewing tobacco when we were little"  . Alcohol Use: Yes    Review of Systems  Constitutional: Negative for fever and chills.  Respiratory: Positive for cough.   Cardiovascular: Negative for chest pain.  Gastrointestinal: Positive for abdominal pain. Negative for nausea, vomiting, diarrhea and constipation.  Genitourinary: Positive for  frequency.  Neurological: Positive for headaches.  All other systems reviewed and are negative.     Allergies  Penicillins and Morphine and related  Home Medications   Prior to Admission medications   Medication Sig Start Date End Date Taking? Authorizing Provider  albuterol (PROVENTIL HFA;VENTOLIN HFA) 108 (90 BASE) MCG/ACT inhaler Inhale 2 puffs into the lungs every 6 (six) hours as needed for wheezing or shortness of breath.    Historical Provider, MD  benzonatate (TESSALON) 100 MG capsule Take 1 capsule (100 mg total) by mouth every 8 (eight) hours. 08/18/14   Ephraim Hamburger, MD  butalbital-acetaminophen-caffeine (ESGIC PLUS) 50-500-40 MG per tablet Take 1 tablet by mouth every 4 (four) hours as needed for pain.    Historical Provider, MD  cyanocobalamin (,VITAMIN B-12,) 1000 MCG/ML injection Inject 1,000 mcg into the muscle once a week. On monday    Historical Provider, MD  diazepam (VALIUM) 10 MG tablet Take 1 tablet (10 mg total) by mouth daily. 06/07/14   Veryl Speak, MD  DULoxetine (CYMBALTA) 60 MG capsule Take 1 capsule (60 mg total) by mouth 2 (two) times daily. 06/07/14   Veryl Speak, MD  gabapentin (NEURONTIN) 400 MG capsule Take 1 capsule (400 mg total) by mouth 3 (three) times daily. 06/07/14   Veryl Speak, MD  Ipratropium-Albuterol (COMBIVENT RESPIMAT) 20-100 MCG/ACT AERS respimat Inhale 1 puff into the lungs daily.    Historical Provider, MD  levothyroxine (SYNTHROID, LEVOTHROID) 50 MCG tablet Take 1 tablet (50 mcg total) by mouth daily before breakfast. 06/07/14   Veryl Speak, MD  Multiple Vitamins-Minerals (MULTIVITAMIN WITH MINERALS) tablet Take 1 tablet by mouth daily.    Historical Provider, MD  omeprazole (PRILOSEC) 20 MG capsule Take 1 capsule (20 mg total) by mouth 2 (two) times daily. 06/07/14   Veryl Speak, MD  oxyCODONE (OXY IR/ROXICODONE) 5 MG immediate release tablet Take 2 tablets (10 mg total) by mouth every 4 (four) hours as needed for severe pain. 08/18/14    Ephraim Hamburger, MD  Oxycodone HCl 10 MG TABS Take 1-2 tablets (10-20 mg total) by mouth every 6 (six) hours as needed. 08/21/14   Blanchie Dessert, MD  potassium chloride SA (K-DUR,KLOR-CON) 20 MEQ tablet Take 1 tablet (20 mEq total) by mouth at bedtime. 06/07/14   Veryl Speak, MD  promethazine (PHENERGAN) 25 MG tablet Take 1 tablet (25 mg total) by mouth 2 (two) times daily. 06/07/14   Veryl Speak, MD  Rivaroxaban (XARELTO) 15 MG TABS tablet Take 15 mg by mouth 2 (two) times daily with a meal.    Historical Provider, MD  tamsulosin (FLOMAX) 0.4 MG CAPS capsule Take 1 capsule (0.4 mg total) by mouth at bedtime. 06/07/14   Veryl Speak, MD   BP 132/98 mmHg  Pulse 91  Temp(Src) 98.3 F (36.8 C) (Oral)  Resp 20  Wt 160 lb (72.576 kg)  SpO2 97% Physical Exam  Constitutional: He is oriented to person, place, and time. He appears well-developed and well-nourished. No distress.  HENT:  Head: Normocephalic and atraumatic.  Mouth/Throat: Oropharynx is clear and moist.  Eyes: Conjunctivae and EOM are normal. Pupils are equal, round, and reactive to light.  Neck: Normal range of motion. Neck supple.  Cardiovascular: Normal rate, regular rhythm and intact distal pulses.   No murmur heard. Pulmonary/Chest: Effort normal and breath sounds normal. No respiratory distress. He has no wheezes. He has no rales. He exhibits tenderness.  Abdominal: Soft. He exhibits no distension. There is tenderness in the right upper quadrant and suprapubic area. There is no rebound and no guarding.  Non-distended with no signs of ascites at this time  Musculoskeletal: Normal range of motion. He exhibits no edema or tenderness.  Neurological: He is alert and oriented to person, place, and time.  Skin: Skin is warm and dry. No rash noted. No erythema.  Psychiatric: He has a normal mood and affect. His behavior is normal.  Nursing note and vitals reviewed.   ED Course  Procedures (including critical care time) Labs  Review Labs Reviewed - No data to display  Imaging Review No results found.   EKG Interpretation None      MDM   Final diagnoses:  Chronic pain    Patient here with multiple complaints however it appears that no these complaints aren't new. This is all chronic. Patient has a significant history of metastatic liver cancer with lung metastases. He also has history of PEs and is on xarelto.  Patient wears chronic oxygen. He has had intermittent cath for years however his urine is clear. He denies any productive cough at this time but states he still having use his nebulizer. Patient is presenting for pain medication. His physician recently left the practice and he saw someone else at that practice who has given him some pain medicine but they only gave him enough for several days. He is scheduled to see his oncologist on Tuesday who he said will continue his pain regiment.  Also December 14 he has appointment with pain management. Patient used to be on hospice and that's where he was receiving his pain control however he is no longer in hospice. Today's vital signs are within normal limits his oxygen is 97% on his home setting. Patient given pain medication and discharged home    Blanchie Dessert, MD 08/21/14 1430

## 2014-08-23 ENCOUNTER — Other Ambulatory Visit: Payer: Self-pay | Admitting: *Deleted

## 2014-08-24 ENCOUNTER — Telehealth: Payer: Self-pay | Admitting: Internal Medicine

## 2014-08-24 ENCOUNTER — Encounter: Payer: Self-pay | Admitting: Internal Medicine

## 2014-08-24 ENCOUNTER — Ambulatory Visit (HOSPITAL_BASED_OUTPATIENT_CLINIC_OR_DEPARTMENT_OTHER): Payer: Medicaid Other | Admitting: Internal Medicine

## 2014-08-24 ENCOUNTER — Ambulatory Visit: Payer: Medicaid Other

## 2014-08-24 ENCOUNTER — Other Ambulatory Visit: Payer: Medicaid Other

## 2014-08-24 VITALS — BP 172/103 | HR 78 | Temp 98.0°F | Resp 17 | Ht 67.0 in | Wt 167.0 lb

## 2014-08-24 DIAGNOSIS — Z85118 Personal history of other malignant neoplasm of bronchus and lung: Secondary | ICD-10-CM

## 2014-08-24 DIAGNOSIS — C3412 Malignant neoplasm of upper lobe, left bronchus or lung: Secondary | ICD-10-CM

## 2014-08-24 NOTE — Progress Notes (Signed)
Robert [name] [phone]   [phone]  CONSULT NOTE  REFERRING PHYSICIAN: Dr. Blanchie Dessert  REASON FOR CONSULTATION:  53 years old white male with questionable history of lung cancer.  HPI Robert [name] is a 53 y.o. male was he patient mentions that in November 2012 he was admitted to [facility] in [place] past medical history significant for multiple medical problems including history of coronary artery disease, stroke with residual left-sided weakness, COPD, hepatitis C, liver dysfunction, history of deep venous thrombosis and pulmonary embolism, migraine headache, depression, myocardial infarction, small bowel obstruction and long history of smoking.  The patient mentions that in November 2012 he was admitted to [facility] in [place] for evaluation of deep venous thrombosis and pulmonary embolism. He was admitted to the intensive care unit at that time and spent almost few months after he was diagnosed with sepsis. The patient mentions that at that time he was diagnosed with lung cancer in the left lung. He does not remember if he was treated for this condition but he also mentions that he has liver metastasis. He is very poor historian and mentions that he was later treated in [place] by urologist for questionable prostate cancer. His story doesn't make any sense. Reviewing his available records from [facility] showed that the patient had CT of the abdomen and pelvis on 04/17/2012 that showed no evidence for malignancy. He also had CT angiogram of the chest on 05/30/2012 that showed no evidence of pulmonary embolism. There was bibasilar atelectasis and biapical mild emphysematous changes with questionable subpleural lipoma in the lateral upper right chest but no mentioning of malignancy in his scan that was supposedly perform 10 months after his diagnosis of lung cancer as the patient mentioned.  MRI of the brain  performed on 05/28/2014 showed no evidence of acute stroke, intracranial hemorrhage or intracranial mass lesions. The patient also mentions that he was admitted to the hospice service for several months but he decided in his on to discontinue the hospice service. It is not clear to me if the patient ever had a diagnosis of cancer or where he was treated in the past. We were unable to get any records from [facility].  He was seen recently at the emergency department at the [facility]. He gives him the same story about the lung cancer with liver metastasis and the patient was referred to me today for evaluation and discussion of his treatment options. He continues to complain of chronic pain and he scheduled to see the pain clinic on 09/13/2014. He also has shortness breath with exertion but no cough or hemoptysis. Family history significant for a mother and grandmother diagnosed with lung cancer and father died from stroke. The patient is single and has 3 children. He was accompanied by his girlfriend today. He used to work as Engineer, drilling. He has a history of smoking more than one pack per day for over 40 years and unfortunately he continues to smoke. He also drinks alcohol occasionally and no history of drug abuse.  HPI  Past Medical History  Diagnosis Date  . Deep vein thrombosis     "several"  . Pulmonary emboli     "several"  . COPD (chronic obstructive pulmonary disease)   . Partial small bowel obstruction   . Myocardial infarction 2007; ~ 2011  . Chronic liver failure   . Shortness of breath     "all the  time"  . Oxygen dependent     3L; 24/7" (05/27/2014)  . History of blood transfusion 2013    "related to kidneys shutting down"  . Hepatitis C   . Depression   . Hypertension   . Hypothyroidism   . Asthma   . Pneumonia     "several times"  . GERD (gastroesophageal reflux disease)   . Migraines     "2-3/day" (05/27/2014)  . Stroke ~ 03/2012      "couldn't use my left hand for ~ 6 months" (05/27/2014)  . Stroke 05/25/2014    "not able to use my left hand again" (05/27/2014)  . Arthritis     "I'm eat up w/it"  . Chronic lower back pain   . Anxiety   . Intermittent self-catheterization of bladder   . Liver cancer   . Metastatic lung cancer     "left"  . B12 deficiency     "give myself shots"    Past Surgical History  Procedure Laterality Date  . Transurethral resection of prostate  2012; 2014  . Tonsillectomy and adenoidectomy  1974  . Knee surgery Left ~ 1972    "cut top of my kneecap off"  . Multiple tooth extractions  11/2010    "26"  . Incision and drainage abscess Left 05/28/2014    Procedure: INCISION AND DRAINAGE ABSCESS LEFT WRIST;  Surgeon: Leanora Cover, MD;  Location: [facility];  Service: Orthopedics;  Laterality: Left;    Family History  Problem Relation Age of Onset  . Cancer Mother   . Cancer Paternal Aunt   . Cancer Maternal Aunt     Social History History  Substance Use Topics  . Smoking status: Current Every Day Smoker -- 0.50 packs/day for 41 years    Types: Cigarettes  . Smokeless tobacco: Former Systems developer     Comment: "used chewing tobacco when we were little"  . Alcohol Use: Yes    Allergies  Allergen Reactions  . Penicillins Itching  . Morphine And Related Itching and Rash    Current Outpatient Prescriptions  Medication Sig Dispense Refill  . albuterol (PROVENTIL HFA;VENTOLIN HFA) 108 (90 BASE) MCG/ACT inhaler Inhale 2 puffs into the lungs every 6 (six) hours as needed for wheezing or shortness of breath.    . benzonatate (TESSALON) 100 MG capsule Take 1 capsule (100 mg total) by mouth every 8 (eight) hours. 21 capsule 0  . butalbital-acetaminophen-caffeine (ESGIC PLUS) 50-500-40 MG per tablet Take 1 tablet by mouth every 4 (four) hours as needed for pain.    . cyanocobalamin (,VITAMIN B-12,) 1000 MCG/ML injection Inject 1,000 mcg into the muscle once a week. On monday    . diazepam (VALIUM) 10  MG tablet Take 1 tablet (10 mg total) by mouth daily. 7 tablet 0  . DULoxetine (CYMBALTA) 60 MG capsule Take 1 capsule (60 mg total) by mouth 2 (two) times daily. 15 capsule 0  . gabapentin (NEURONTIN) 400 MG capsule Take 1 capsule (400 mg total) by mouth 3 (three) times daily. 21 capsule 0  . Ipratropium-Albuterol (COMBIVENT RESPIMAT) 20-100 MCG/ACT AERS respimat Inhale 1 puff into the lungs daily.    Marland Kitchen levothyroxine (SYNTHROID, LEVOTHROID) 50 MCG tablet Take 1 tablet (50 mcg total) by mouth daily before breakfast. 7 tablet 0  . Multiple Vitamins-Minerals (MULTIVITAMIN WITH MINERALS) tablet Take 1 tablet by mouth daily.    Marland Kitchen omeprazole (PRILOSEC) 20 MG capsule Take 1 capsule (20 mg total) by mouth 2 (two) times daily. 14 capsule 0  .  oxyCODONE (OXY IR/ROXICODONE) 5 MG immediate release tablet Take 2 tablets (10 mg total) by mouth every 4 (four) hours as needed for severe pain. 15 tablet 0  . Oxycodone HCl 10 MG TABS Take 1-2 tablets (10-20 mg total) by mouth every 6 (six) hours as needed. 20 tablet 0  . potassium chloride SA (K-DUR,KLOR-CON) 20 MEQ tablet Take 1 tablet (20 mEq total) by mouth at bedtime. 7 tablet 0  . promethazine (PHENERGAN) 25 MG tablet Take 1 tablet (25 mg total) by mouth 2 (two) times daily. 14 tablet 0  . Rivaroxaban (XARELTO) 15 MG TABS tablet Take 15 mg by mouth 2 (two) times daily with a meal.    . tamsulosin (FLOMAX) 0.4 MG CAPS capsule Take 1 capsule (0.4 mg total) by mouth at bedtime. 7 capsule 0   No current facility-administered medications for this visit.    Review of Systems  Constitutional: positive for fatigue Eyes: negative Ears, nose, mouth, throat, and face: negative Respiratory: positive for cough and dyspnea on exertion Cardiovascular: negative Gastrointestinal: negative Genitourinary:negative Integument/breast: negative Hematologic/lymphatic: negative Musculoskeletal:positive for back pain and bone pain Neurological: negative Behavioral/Psych:  negative Endocrine: negative Allergic/Immunologic: negative  Physical Exam  VOH:YWVPX, healthy, no distress, well nourished and well developed SKIN: skin color, texture, turgor are normal, no rashes or significant lesions HEAD: Normocephalic, No masses, lesions, tenderness or abnormalities EYES: normal, PERRLA EARS: External ears normal OROPHARYNX:no exudate and no erythema  NECK: supple, no adenopathy, no JVD LYMPH:  no palpable lymphadenopathy, no hepatosplenomegaly LUNGS: expiratory wheezes bilaterally HEART: regular rate & rhythm and no murmurs ABDOMEN:abdomen soft, non-tender, normal bowel sounds and no masses or organomegaly BACK: Back symmetric, no curvature., No CVA tenderness EXTREMITIES:no joint deformities, effusion, or inflammation, no edema, no skin discoloration  NEURO: alert & oriented x 3 with fluent speech, no focal motor/sensory deficits  PERFORMANCE STATUS: ECOG 1  LABORATORY DATA: Lab Results  Component Value Date   WBC 10.3 08/18/2014   HGB 11.6* 08/18/2014   HCT 34.8* 08/18/2014   MCV 91.1 08/18/2014   PLT 439* 08/18/2014      Chemistry      Component Value Date/Time   NA 142 08/18/2014 1953   K 3.4* 08/18/2014 1953   CL 105 08/18/2014 1953   CO2 21 08/18/2014 1953   BUN 10 08/18/2014 1953   CREATININE 0.80 08/18/2014 1953      Component Value Date/Time   CALCIUM 8.8 08/18/2014 1953   ALKPHOS 57 08/11/2014 1620   AST 42* 08/11/2014 1620   ALT 45 08/11/2014 1620   BILITOT 0.3 08/11/2014 1620       RADIOGRAPHIC STUDIES: Dg Chest 2 View  08/18/2014   CLINICAL DATA:  Subsequent evaluation for productive cough for 9 days with chest pain, personal history of lung cancer  EXAM: CHEST  2 VIEW  COMPARISON:  Multiple prior studies the most recent being 08/11/2014  FINDINGS: Stable 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 Heart size and vascular pattern are normal. No infiltrate consolidation or effusion. Stable mild scarring right middle  lobe when compared to numerous prior studies.  IMPRESSION: No acute findings.   Electronically Signed   By: Skipper Cliche M.D.   On: 08/18/2014 20:53   Dg Chest 2 View  08/11/2014   CLINICAL DATA:  Hemoptysis, history of carcinoma of the lung  EXAM: CHEST  2 VIEW  COMPARISON:  07/12/2014  FINDINGS: Cardiac shadow is within normal limits. The lungs are well aerated bilaterally without focal infiltrate or sizable  effusion. Mild chronic bronchial changes are noted as well as some thickening of the pleural fat on the right. These changes are stable from the prior exam. No new bony abnormality is noted.  IMPRESSION: Stable chronic changes without acute abnormality.   Electronically Signed   By: Inez Catalina M.D.   On: 08/11/2014 16:17    ASSESSMENT: This is a very pleasant 53 years old white male who came today for evaluation of history of lung cancer with liver metastasis. Unfortunately I don't have any record in this patient to confirm his story. The recent imaging studies were unremarkable for any abnormalities in the lung or abdomen. He mentions that his diagnosis was performed at [facility].   PLAN: I had a lengthy discussion with the patient today about his condition . I will try to obtain his previous records from [facility]. I will also arrange for the patient to have repeat CT scan of the chest, abdomen and pelvis to evaluate the presence or absence of lung cancer and liver metastasis. I would see the patient back for follow-up visit in 2 weeks for discussion of his scan results and further recommendation regarding his condition. For pain management the patient will continue with his primary care physician and also with the pain clinic as previously scheduled. He gives a story about lost pain medication and filing a police report. I explained to the patient that he will need to continue receiving his pain medication from the prescribing physician.  he was advised to call  immediately if he has any concerning symptoms in the interval.  The patient voices understanding of current disease status and treatment options and is in agreement with the current care plan.  All questions were answered. The patient knows to call the clinic with any problems, questions or concerns. We can certainly see the patient much sooner if necessary.  Thank you so much for allowing me to participate in the care of Robert [name]. I will continue to follow up the patient with you and assist in his care.  I spent 40 minutes counseling the patient face to face. The total time spent in the appointment was 60 minutes.  Disclaimer: This note was dictated with voice recognition software. Similar sounding words can inadvertently be transcribed and may not be corrected upon review.   Laronica Bhagat K. 08/24/2014, 3:33 PM

## 2014-08-24 NOTE — Telephone Encounter (Signed)
gv adn printed appt sched and avs for pt for DEC...gv pt barium

## 2014-08-24 NOTE — Progress Notes (Signed)
Checked in new patient with no issues prior to seeing the dr,note says no lab needed. He has not been traveling and has appt card.

## 2014-08-25 ENCOUNTER — Encounter (HOSPITAL_BASED_OUTPATIENT_CLINIC_OR_DEPARTMENT_OTHER): Payer: Self-pay | Admitting: *Deleted

## 2014-08-25 ENCOUNTER — Emergency Department (HOSPITAL_BASED_OUTPATIENT_CLINIC_OR_DEPARTMENT_OTHER)
Admission: EM | Admit: 2014-08-25 | Discharge: 2014-08-25 | Disposition: A | Discharge status: Home / Self Care | Payer: Medicaid Other | Attending: Emergency Medicine | Admitting: Emergency Medicine

## 2014-08-25 DIAGNOSIS — K219 Gastro-esophageal reflux disease without esophagitis: Secondary | ICD-10-CM | POA: Diagnosis not present

## 2014-08-25 DIAGNOSIS — J449 Chronic obstructive pulmonary disease, unspecified: Secondary | ICD-10-CM | POA: Insufficient documentation

## 2014-08-25 DIAGNOSIS — Z9981 Dependence on supplemental oxygen: Secondary | ICD-10-CM | POA: Insufficient documentation

## 2014-08-25 DIAGNOSIS — G43909 Migraine, unspecified, not intractable, without status migrainosus: Secondary | ICD-10-CM | POA: Insufficient documentation

## 2014-08-25 DIAGNOSIS — Z8701 Personal history of pneumonia (recurrent): Secondary | ICD-10-CM | POA: Diagnosis not present

## 2014-08-25 DIAGNOSIS — Z720 Tobacco use: Secondary | ICD-10-CM | POA: Insufficient documentation

## 2014-08-25 DIAGNOSIS — Z85118 Personal history of other malignant neoplasm of bronchus and lung: Secondary | ICD-10-CM | POA: Insufficient documentation

## 2014-08-25 DIAGNOSIS — Z8619 Personal history of other infectious and parasitic diseases: Secondary | ICD-10-CM | POA: Insufficient documentation

## 2014-08-25 DIAGNOSIS — Z880 Allergy status to penicillin: Secondary | ICD-10-CM | POA: Insufficient documentation

## 2014-08-25 DIAGNOSIS — Z7901 Long term (current) use of anticoagulants: Secondary | ICD-10-CM | POA: Insufficient documentation

## 2014-08-25 DIAGNOSIS — R109 Unspecified abdominal pain: Secondary | ICD-10-CM | POA: Insufficient documentation

## 2014-08-25 DIAGNOSIS — Z86718 Personal history of other venous thrombosis and embolism: Secondary | ICD-10-CM | POA: Diagnosis not present

## 2014-08-25 DIAGNOSIS — G8929 Other chronic pain: Secondary | ICD-10-CM | POA: Diagnosis not present

## 2014-08-25 DIAGNOSIS — I252 Old myocardial infarction: Secondary | ICD-10-CM | POA: Insufficient documentation

## 2014-08-25 DIAGNOSIS — Z86711 Personal history of pulmonary embolism: Secondary | ICD-10-CM | POA: Insufficient documentation

## 2014-08-25 DIAGNOSIS — Z79899 Other long term (current) drug therapy: Secondary | ICD-10-CM | POA: Insufficient documentation

## 2014-08-25 DIAGNOSIS — M549 Dorsalgia, unspecified: Secondary | ICD-10-CM | POA: Insufficient documentation

## 2014-08-25 DIAGNOSIS — F329 Major depressive disorder, single episode, unspecified: Secondary | ICD-10-CM | POA: Diagnosis not present

## 2014-08-25 DIAGNOSIS — F419 Anxiety disorder, unspecified: Secondary | ICD-10-CM | POA: Insufficient documentation

## 2014-08-25 DIAGNOSIS — Z760 Encounter for issue of repeat prescription: Secondary | ICD-10-CM | POA: Diagnosis present

## 2014-08-25 DIAGNOSIS — E039 Hypothyroidism, unspecified: Secondary | ICD-10-CM | POA: Insufficient documentation

## 2014-08-25 DIAGNOSIS — I10 Essential (primary) hypertension: Secondary | ICD-10-CM | POA: Insufficient documentation

## 2014-08-25 DIAGNOSIS — D519 Vitamin B12 deficiency anemia, unspecified: Secondary | ICD-10-CM | POA: Insufficient documentation

## 2014-08-25 DIAGNOSIS — Z8505 Personal history of malignant neoplasm of liver: Secondary | ICD-10-CM | POA: Diagnosis not present

## 2014-08-25 DIAGNOSIS — Z8673 Personal history of transient ischemic attack (TIA), and cerebral infarction without residual deficits: Secondary | ICD-10-CM | POA: Diagnosis not present

## 2014-08-25 LAB — INFLUENZA PANEL BY PCR (TYPE A & B)
H1N1FLUPCR: NOT DETECTED
INFLBPCR: NEGATIVE
Influenza A By PCR: NEGATIVE

## 2014-08-25 MED ORDER — KETOROLAC TROMETHAMINE 60 MG/2ML IM SOLN: 60.0000 mg | Freq: Once | INTRAMUSCULAR | Status: DC

## 2014-08-25 NOTE — ED Notes (Signed)
Pt reports he went to pain clinic as referred to ED because they couldn't write for his pain meds

## 2014-08-25 NOTE — ED Provider Notes (Signed)
CSN: [id number]     Arrival date & time 08/25/14  1033 History   First MD Initiated Contact with Patient 08/25/14 1049     Chief Complaint  Patient presents with  . Medication Refill     (Consider location/radiation/quality/duration/timing/severity/associated sxs/prior Treatment) HPI Comments: Patient states "my doctor told me to come here to get pain medicine". Patient reports a history of chronic pain. He reports that he saw his pain management specialist today, but "they only give me 10 mg Roxicet, and I used to be on 30. I need to get referred to a stronger pain clinic". Patient complains of diffuse pain which is chronic in nature.   Past Medical History  Diagnosis Date  . Deep vein thrombosis     "several"  . Pulmonary emboli     "several"  . COPD (chronic obstructive pulmonary disease)   . Partial small bowel obstruction   . Myocardial infarction 2007; ~ 2011  . Chronic liver failure   . Shortness of breath     "all the time"  . Oxygen dependent     3L; 24/7" (05/27/2014)  . History of blood transfusion 2013    "related to kidneys shutting down"  . Hepatitis C   . Depression   . Hypertension   . Hypothyroidism   . Asthma   . Pneumonia     "several times"  . GERD (gastroesophageal reflux disease)   . Migraines     "2-3/day" (05/27/2014)  . Stroke ~ 03/2012    "couldn't use my left hand for ~ 6 months" (05/27/2014)  . Stroke 05/25/2014    "not able to use my left hand again" (05/27/2014)  . Arthritis     "I'm eat up w/it"  . Chronic lower back pain   . Anxiety   . Intermittent self-catheterization of bladder   . Liver cancer   . Metastatic lung cancer     "left"  . B12 deficiency     "give myself shots"   Past Surgical History  Procedure Laterality Date  . Transurethral resection of prostate  2012; 2014  . Tonsillectomy and adenoidectomy  1974  . Knee surgery Left ~ 1972    "cut top of my kneecap off"  . Multiple tooth extractions  11/2010    "26"  .  Incision and drainage abscess Left 05/28/2014    Procedure: INCISION AND DRAINAGE ABSCESS LEFT WRIST;  Surgeon: Leanora Cover, MD;  Location: [facility];  Service: Orthopedics;  Laterality: Left;   Family History  Problem Relation Age of Onset  . Cancer Mother   . Cancer Paternal Aunt   . Cancer Maternal Aunt    History  Substance Use Topics  . Smoking status: Current Every Day Smoker -- 0.50 packs/day for 41 years    Types: Cigarettes  . Smokeless tobacco: Former Systems developer     Comment: "used chewing tobacco when we were little"  . Alcohol Use: Yes    Review of Systems  Gastrointestinal: Positive for abdominal pain.  Musculoskeletal: Positive for back pain.  All other systems reviewed and are negative.     Allergies  Penicillins and Morphine and related  Home Medications   Prior to Admission medications   Medication Sig Start Date End Date Taking? Authorizing Provider  albuterol (PROVENTIL HFA;VENTOLIN HFA) 108 (90 BASE) MCG/ACT inhaler Inhale 2 puffs into the lungs every 6 (six) hours as needed for wheezing or shortness of breath.    Historical Provider, MD  benzonatate (TESSALON) 100 MG capsule  Take 1 capsule (100 mg total) by mouth every 8 (eight) hours. 08/18/14   Ephraim Hamburger, MD  butalbital-acetaminophen-caffeine (ESGIC PLUS) 50-500-40 MG per tablet Take 1 tablet by mouth every 4 (four) hours as needed for pain.    Historical Provider, MD  cyanocobalamin (,VITAMIN B-12,) 1000 MCG/ML injection Inject 1,000 mcg into the muscle once a week. On monday    Historical Provider, MD  diazepam (VALIUM) 10 MG tablet Take 1 tablet (10 mg total) by mouth daily. 06/07/14   Veryl Speak, MD  DULoxetine (CYMBALTA) 60 MG capsule Take 1 capsule (60 mg total) by mouth 2 (two) times daily. 06/07/14   Veryl Speak, MD  gabapentin (NEURONTIN) 400 MG capsule Take 1 capsule (400 mg total) by mouth 3 (three) times daily. 06/07/14   Veryl Speak, MD  Ipratropium-Albuterol (COMBIVENT RESPIMAT) 20-100 MCG/ACT  AERS respimat Inhale 1 puff into the lungs daily.    Historical Provider, MD  levothyroxine (SYNTHROID, LEVOTHROID) 50 MCG tablet Take 1 tablet (50 mcg total) by mouth daily before breakfast. 06/07/14   Veryl Speak, MD  Multiple Vitamins-Minerals (MULTIVITAMIN WITH MINERALS) tablet Take 1 tablet by mouth daily.    Historical Provider, MD  omeprazole (PRILOSEC) 20 MG capsule Take 1 capsule (20 mg total) by mouth 2 (two) times daily. 06/07/14   Veryl Speak, MD  oxyCODONE (OXY IR/ROXICODONE) 5 MG immediate release tablet Take 2 tablets (10 mg total) by mouth every 4 (four) hours as needed for severe pain. 08/18/14   Ephraim Hamburger, MD  Oxycodone HCl 10 MG TABS Take 1-2 tablets (10-20 mg total) by mouth every 6 (six) hours as needed. 08/21/14   Blanchie Dessert, MD  potassium chloride SA (K-DUR,KLOR-CON) 20 MEQ tablet Take 1 tablet (20 mEq total) by mouth at bedtime. 06/07/14   Veryl Speak, MD  promethazine (PHENERGAN) 25 MG tablet Take 1 tablet (25 mg total) by mouth 2 (two) times daily. 06/07/14   Veryl Speak, MD  Rivaroxaban (XARELTO) 15 MG TABS tablet Take 15 mg by mouth 2 (two) times daily with a meal.    Historical Provider, MD  tamsulosin (FLOMAX) 0.4 MG CAPS capsule Take 1 capsule (0.4 mg total) by mouth at bedtime. 06/07/14   Veryl Speak, MD   BP 168/100 mmHg  Pulse 79  Temp(Src) 98 F (36.7 C) (Oral)  Resp 18  SpO2 100% Physical Exam  Constitutional: He is oriented to person, place, and time. He appears well-developed and well-nourished. No distress.  HENT:  Head: Normocephalic and atraumatic.  Right Ear: Hearing normal.  Left Ear: Hearing normal.  Nose: Nose normal.  Mouth/Throat: Oropharynx is clear and moist and mucous membranes are normal.  Eyes: Conjunctivae and EOM are normal. Pupils are equal, round, and reactive to light.  Neck: Normal range of motion. Neck supple.  Cardiovascular: Regular rhythm, S1 normal and S2 normal.  Exam reveals no gallop and no friction rub.   No murmur  heard. Pulmonary/Chest: Effort normal and breath sounds normal. No respiratory distress. He exhibits no tenderness.  Abdominal: Soft. Normal appearance and bowel sounds are normal. There is no hepatosplenomegaly. There is no tenderness. There is no rebound, no guarding, no tenderness at McBurney's point and negative Murphy's sign. No hernia.  Musculoskeletal: Normal range of motion.  Neurological: He is alert and oriented to person, place, and time. He has normal strength. No cranial nerve deficit or sensory deficit. Coordination normal. GCS eye subscore is 4. GCS verbal subscore is 5. GCS motor subscore is 6.  Seems  to be slurring words slightly  Skin: Skin is warm, dry and intact. No rash noted. No cyanosis.  Psychiatric: He has a normal mood and affect. His speech is normal and behavior is normal. Thought content normal.  Nursing note and vitals reviewed.   ED Course  Procedures (including critical care time) Labs Review Labs Reviewed - No data to display  Imaging Review No results found.   EKG Interpretation None      MDM   Final diagnoses:  Chronic pain    Patient presents to the ER requesting pain medication. He has a history of chronic pain. Patient seen 3 days ago with complaints of his chronic abdominal pain. He is actually been here several times in the last 2 weeks. Workups have been unremarkable previously. No workup necessary today. Patient was given a prescription for 20 Roxicet tablets 3 days ago. He has a pain management specialist, oncologist, primary care doctor. Patient was told he would not be given any further narcotics in the emergency department today.    Orpah Greek, MD 08/25/14 1106

## 2014-08-25 NOTE — Discharge Instructions (Signed)

## 2014-08-26 ENCOUNTER — Emergency Department (HOSPITAL_COMMUNITY)
Admission: EM | Admit: 2014-08-26 | Discharge: 2014-08-26 | Disposition: A | Discharge status: Home / Self Care | Payer: Medicaid Other | Attending: Emergency Medicine | Admitting: Emergency Medicine

## 2014-08-26 DIAGNOSIS — Z86711 Personal history of pulmonary embolism: Secondary | ICD-10-CM | POA: Insufficient documentation

## 2014-08-26 DIAGNOSIS — F329 Major depressive disorder, single episode, unspecified: Secondary | ICD-10-CM | POA: Insufficient documentation

## 2014-08-26 DIAGNOSIS — Z880 Allergy status to penicillin: Secondary | ICD-10-CM | POA: Diagnosis not present

## 2014-08-26 DIAGNOSIS — K219 Gastro-esophageal reflux disease without esophagitis: Secondary | ICD-10-CM | POA: Insufficient documentation

## 2014-08-26 DIAGNOSIS — Z8619 Personal history of other infectious and parasitic diseases: Secondary | ICD-10-CM | POA: Insufficient documentation

## 2014-08-26 DIAGNOSIS — R109 Unspecified abdominal pain: Secondary | ICD-10-CM | POA: Diagnosis present

## 2014-08-26 DIAGNOSIS — Z7901 Long term (current) use of anticoagulants: Secondary | ICD-10-CM | POA: Diagnosis not present

## 2014-08-26 DIAGNOSIS — Z9981 Dependence on supplemental oxygen: Secondary | ICD-10-CM | POA: Insufficient documentation

## 2014-08-26 DIAGNOSIS — I252 Old myocardial infarction: Secondary | ICD-10-CM | POA: Diagnosis not present

## 2014-08-26 DIAGNOSIS — Z720 Tobacco use: Secondary | ICD-10-CM | POA: Diagnosis not present

## 2014-08-26 DIAGNOSIS — Z79899 Other long term (current) drug therapy: Secondary | ICD-10-CM | POA: Diagnosis not present

## 2014-08-26 DIAGNOSIS — Z8673 Personal history of transient ischemic attack (TIA), and cerebral infarction without residual deficits: Secondary | ICD-10-CM | POA: Insufficient documentation

## 2014-08-26 DIAGNOSIS — E538 Deficiency of other specified B group vitamins: Secondary | ICD-10-CM | POA: Diagnosis not present

## 2014-08-26 DIAGNOSIS — Z8701 Personal history of pneumonia (recurrent): Secondary | ICD-10-CM | POA: Insufficient documentation

## 2014-08-26 DIAGNOSIS — Z85118 Personal history of other malignant neoplasm of bronchus and lung: Secondary | ICD-10-CM | POA: Insufficient documentation

## 2014-08-26 DIAGNOSIS — R51 Headache: Secondary | ICD-10-CM | POA: Diagnosis not present

## 2014-08-26 DIAGNOSIS — E039 Hypothyroidism, unspecified: Secondary | ICD-10-CM | POA: Insufficient documentation

## 2014-08-26 DIAGNOSIS — Z8505 Personal history of malignant neoplasm of liver: Secondary | ICD-10-CM | POA: Insufficient documentation

## 2014-08-26 DIAGNOSIS — G8929 Other chronic pain: Secondary | ICD-10-CM | POA: Insufficient documentation

## 2014-08-26 DIAGNOSIS — Z86718 Personal history of other venous thrombosis and embolism: Secondary | ICD-10-CM | POA: Insufficient documentation

## 2014-08-26 DIAGNOSIS — I10 Essential (primary) hypertension: Secondary | ICD-10-CM | POA: Insufficient documentation

## 2014-08-26 DIAGNOSIS — J441 Chronic obstructive pulmonary disease with (acute) exacerbation: Secondary | ICD-10-CM | POA: Insufficient documentation

## 2014-08-26 DIAGNOSIS — F419 Anxiety disorder, unspecified: Secondary | ICD-10-CM | POA: Diagnosis not present

## 2014-08-26 MED ORDER — HYDROMORPHONE HCL 1 MG/ML IJ SOLN
1.0000 mg | Freq: Once | INTRAMUSCULAR | Status: AC
  Administered 2014-08-26: 1 mg via INTRAMUSCULAR
  Filled 2014-08-26: qty 1

## 2014-08-26 MED ORDER — METOCLOPRAMIDE HCL 5 MG/ML IJ SOLN
10.0000 mg | Freq: Once | INTRAMUSCULAR | Status: AC
  Administered 2014-08-26: 10 mg via INTRAMUSCULAR
  Filled 2014-08-26: qty 2

## 2014-08-26 MED ORDER — DIPHENHYDRAMINE HCL 50 MG/ML IJ SOLN
25.0000 mg | Freq: Once | INTRAMUSCULAR | Status: AC
  Administered 2014-08-26: 25 mg via INTRAMUSCULAR
  Filled 2014-08-26: qty 1

## 2014-08-26 NOTE — Discharge Instructions (Signed)

## 2014-08-26 NOTE — ED Notes (Signed)
MD discussed pain management options with patient. Pt and family verbalized understanding. Instructed pt on follow up appointments with primary doctor for further chronic pain management options

## 2014-08-26 NOTE — ED Provider Notes (Signed)
CSN: [id number]     Arrival date & time 08/26/14  1756 History   First MD Initiated Contact with Patient 08/26/14 1815     Chief Complaint  Patient presents with  . ca pt, abdominal pain, headache      (Consider location/radiation/quality/duration/timing/severity/associated sxs/prior Treatment) HPI Comments: Patient here complaining of worsening of his chronic pain. No new symptoms noted. Was seen yesterday at outside facility for similar symptoms and not given a prescription for narcotics. He does have follow-up in the pain management clinic as well as he is being seen by oncology. Denies any fever or chills. No vomiting or diarrhea. No new symptoms reported. Symptoms persistent and nothing makes them better or worse.  The history is provided by the patient.    Past Medical History  Diagnosis Date  . Deep vein thrombosis     "several"  . Pulmonary emboli     "several"  . COPD (chronic obstructive pulmonary disease)   . Partial small bowel obstruction   . Myocardial infarction 2007; ~ 2011  . Chronic liver failure   . Shortness of breath     "all the time"  . Oxygen dependent     3L; 24/7" (05/27/2014)  . History of blood transfusion 2013    "related to kidneys shutting down"  . Hepatitis C   . Depression   . Hypertension   . Hypothyroidism   . Asthma   . Pneumonia     "several times"  . GERD (gastroesophageal reflux disease)   . Migraines     "2-3/day" (05/27/2014)  . Stroke ~ 03/2012    "couldn't use my left hand for ~ 6 months" (05/27/2014)  . Stroke 05/25/2014    "not able to use my left hand again" (05/27/2014)  . Arthritis     "I'm eat up w/it"  . Chronic lower back pain   . Anxiety   . Intermittent self-catheterization of bladder   . Liver cancer   . Metastatic lung cancer     "left"  . B12 deficiency     "give myself shots"   Past Surgical History  Procedure Laterality Date  . Transurethral resection of prostate  2012; 2014  . Tonsillectomy and  adenoidectomy  1974  . Knee surgery Left ~ 1972    "cut top of my kneecap off"  . Multiple tooth extractions  11/2010    "26"  . Incision and drainage abscess Left 05/28/2014    Procedure: INCISION AND DRAINAGE ABSCESS LEFT WRIST;  Surgeon: Leanora Cover, MD;  Location: [facility];  Service: Orthopedics;  Laterality: Left;   Family History  Problem Relation Age of Onset  . Cancer Mother   . Cancer Paternal Aunt   . Cancer Maternal Aunt    History  Substance Use Topics  . Smoking status: Current Every Day Smoker -- 0.50 packs/day for 41 years    Types: Cigarettes  . Smokeless tobacco: Former Systems developer     Comment: "used chewing tobacco when we were little"  . Alcohol Use: Yes    Review of Systems  All other systems reviewed and are negative.     Allergies  Penicillins and Morphine and related  Home Medications   Prior to Admission medications   Medication Sig Start Date End Date Taking? Authorizing Provider  albuterol (PROVENTIL HFA;VENTOLIN HFA) 108 (90 BASE) MCG/ACT inhaler Inhale 2 puffs into the lungs every 6 (six) hours as needed for wheezing or shortness of breath.    Historical Provider, MD  benzonatate (TESSALON) 100 MG capsule Take 1 capsule (100 mg total) by mouth every 8 (eight) hours. 08/18/14   Ephraim Hamburger, MD  butalbital-acetaminophen-caffeine (ESGIC PLUS) 50-500-40 MG per tablet Take 1 tablet by mouth every 4 (four) hours as needed for pain.    Historical Provider, MD  cyanocobalamin (,VITAMIN B-12,) 1000 MCG/ML injection Inject 1,000 mcg into the muscle once a week. On monday    Historical Provider, MD  diazepam (VALIUM) 10 MG tablet Take 1 tablet (10 mg total) by mouth daily. 06/07/14   Veryl Speak, MD  DULoxetine (CYMBALTA) 60 MG capsule Take 1 capsule (60 mg total) by mouth 2 (two) times daily. 06/07/14   Veryl Speak, MD  gabapentin (NEURONTIN) 400 MG capsule Take 1 capsule (400 mg total) by mouth 3 (three) times daily. 06/07/14   Veryl Speak, MD   Ipratropium-Albuterol (COMBIVENT RESPIMAT) 20-100 MCG/ACT AERS respimat Inhale 1 puff into the lungs daily.    Historical Provider, MD  levothyroxine (SYNTHROID, LEVOTHROID) 50 MCG tablet Take 1 tablet (50 mcg total) by mouth daily before breakfast. 06/07/14   Veryl Speak, MD  Multiple Vitamins-Minerals (MULTIVITAMIN WITH MINERALS) tablet Take 1 tablet by mouth daily.    Historical Provider, MD  omeprazole (PRILOSEC) 20 MG capsule Take 1 capsule (20 mg total) by mouth 2 (two) times daily. 06/07/14   Veryl Speak, MD  oxyCODONE (OXY IR/ROXICODONE) 5 MG immediate release tablet Take 2 tablets (10 mg total) by mouth every 4 (four) hours as needed for severe pain. 08/18/14   Ephraim Hamburger, MD  Oxycodone HCl 10 MG TABS Take 1-2 tablets (10-20 mg total) by mouth every 6 (six) hours as needed. 08/21/14   Blanchie Dessert, MD  potassium chloride SA (K-DUR,KLOR-CON) 20 MEQ tablet Take 1 tablet (20 mEq total) by mouth at bedtime. 06/07/14   Veryl Speak, MD  promethazine (PHENERGAN) 25 MG tablet Take 1 tablet (25 mg total) by mouth 2 (two) times daily. 06/07/14   Veryl Speak, MD  Rivaroxaban (XARELTO) 15 MG TABS tablet Take 15 mg by mouth 2 (two) times daily with a meal.    Historical Provider, MD  tamsulosin (FLOMAX) 0.4 MG CAPS capsule Take 1 capsule (0.4 mg total) by mouth at bedtime. 06/07/14   Veryl Speak, MD   BP 152/95 mmHg  Pulse 91  Temp(Src) 98.2 F (36.8 C) (Oral)  Resp 16  SpO2 100% Physical Exam  Constitutional: He is oriented to person, place, and time. He appears well-developed and well-nourished.  Non-toxic appearance. No distress.  HENT:  Head: Normocephalic and atraumatic.  Eyes: Conjunctivae, EOM and lids are normal. Pupils are equal, round, and reactive to light.  Neck: Normal range of motion. Neck supple. No tracheal deviation present. No thyroid mass present.  Cardiovascular: Normal rate, regular rhythm and normal heart sounds.  Exam reveals no gallop.   No murmur  heard. Pulmonary/Chest: Effort normal and breath sounds normal. No stridor. No respiratory distress. He has no decreased breath sounds. He has no wheezes. He has no rhonchi. He has no rales.  Abdominal: Soft. Normal appearance and bowel sounds are normal. He exhibits no distension. There is no tenderness. There is no rebound and no CVA tenderness.  Musculoskeletal: Normal range of motion. He exhibits no edema or tenderness.  Neurological: He is alert and oriented to person, place, and time. He has normal strength. No cranial nerve deficit or sensory deficit. GCS eye subscore is 4. GCS verbal subscore is 5. GCS motor subscore is 6.  Skin: Skin  is warm and dry. No abrasion and no rash noted.  Psychiatric: He has a normal mood and affect. His speech is normal and behavior is normal.  Nursing note and vitals reviewed.   ED Course  Procedures (including critical care time) Labs Review Labs Reviewed - No data to display  Imaging Review No results found.   EKG Interpretation None      MDM   Final diagnoses:  None    Patient given meds here for his headache. No red flags for neurological emergencies. He was encouraged to follow-up with his pain management specialist and will not be given a prescription for narcotics. Review of the [facility] shows that he had prescriptions written on the 19th and 21st of this month for a total of 35 tablets.    Leota Jacobsen, MD 08/26/14 [phone]

## 2014-08-26 NOTE — ED Notes (Signed)
Pt seen yesterday for same complaint. Hx liver and lung cancer, on 2 L [place] continuously. Abdominal pain and swelling, right leg pain, right sided pain, right sided headache. Pain 10/10. Pt was told yesterday that "he was too sick for provider to write prescription for pain medication."

## 2014-08-28 ENCOUNTER — Emergency Department (HOSPITAL_BASED_OUTPATIENT_CLINIC_OR_DEPARTMENT_OTHER)
Admission: EM | Admit: 2014-08-28 | Discharge: 2014-08-28 | Disposition: A | Discharge status: Home / Self Care | Payer: Medicaid Other | Attending: Emergency Medicine | Admitting: Emergency Medicine

## 2014-08-28 ENCOUNTER — Encounter (HOSPITAL_BASED_OUTPATIENT_CLINIC_OR_DEPARTMENT_OTHER): Payer: Self-pay

## 2014-08-28 DIAGNOSIS — E039 Hypothyroidism, unspecified: Secondary | ICD-10-CM | POA: Diagnosis not present

## 2014-08-28 DIAGNOSIS — Z85118 Personal history of other malignant neoplasm of bronchus and lung: Secondary | ICD-10-CM | POA: Insufficient documentation

## 2014-08-28 DIAGNOSIS — Z8505 Personal history of malignant neoplasm of liver: Secondary | ICD-10-CM | POA: Diagnosis not present

## 2014-08-28 DIAGNOSIS — M79606 Pain in leg, unspecified: Secondary | ICD-10-CM | POA: Diagnosis present

## 2014-08-28 DIAGNOSIS — Z8701 Personal history of pneumonia (recurrent): Secondary | ICD-10-CM | POA: Insufficient documentation

## 2014-08-28 DIAGNOSIS — J45909 Unspecified asthma, uncomplicated: Secondary | ICD-10-CM | POA: Diagnosis not present

## 2014-08-28 DIAGNOSIS — G43909 Migraine, unspecified, not intractable, without status migrainosus: Secondary | ICD-10-CM | POA: Diagnosis not present

## 2014-08-28 DIAGNOSIS — I252 Old myocardial infarction: Secondary | ICD-10-CM | POA: Diagnosis not present

## 2014-08-28 DIAGNOSIS — F329 Major depressive disorder, single episode, unspecified: Secondary | ICD-10-CM | POA: Diagnosis not present

## 2014-08-28 DIAGNOSIS — Z86711 Personal history of pulmonary embolism: Secondary | ICD-10-CM | POA: Diagnosis not present

## 2014-08-28 DIAGNOSIS — Z720 Tobacco use: Secondary | ICD-10-CM | POA: Diagnosis not present

## 2014-08-28 DIAGNOSIS — J449 Chronic obstructive pulmonary disease, unspecified: Secondary | ICD-10-CM | POA: Insufficient documentation

## 2014-08-28 DIAGNOSIS — Z8619 Personal history of other infectious and parasitic diseases: Secondary | ICD-10-CM | POA: Insufficient documentation

## 2014-08-28 DIAGNOSIS — G8929 Other chronic pain: Secondary | ICD-10-CM | POA: Insufficient documentation

## 2014-08-28 DIAGNOSIS — Z79899 Other long term (current) drug therapy: Secondary | ICD-10-CM | POA: Diagnosis not present

## 2014-08-28 DIAGNOSIS — Z86718 Personal history of other venous thrombosis and embolism: Secondary | ICD-10-CM | POA: Diagnosis not present

## 2014-08-28 DIAGNOSIS — Z880 Allergy status to penicillin: Secondary | ICD-10-CM | POA: Insufficient documentation

## 2014-08-28 DIAGNOSIS — I10 Essential (primary) hypertension: Secondary | ICD-10-CM | POA: Insufficient documentation

## 2014-08-28 DIAGNOSIS — F419 Anxiety disorder, unspecified: Secondary | ICD-10-CM | POA: Diagnosis not present

## 2014-08-28 DIAGNOSIS — Z9981 Dependence on supplemental oxygen: Secondary | ICD-10-CM | POA: Insufficient documentation

## 2014-08-28 DIAGNOSIS — Z8673 Personal history of transient ischemic attack (TIA), and cerebral infarction without residual deficits: Secondary | ICD-10-CM | POA: Diagnosis not present

## 2014-08-28 DIAGNOSIS — K219 Gastro-esophageal reflux disease without esophagitis: Secondary | ICD-10-CM | POA: Diagnosis not present

## 2014-08-28 MED ORDER — OXYCODONE-ACETAMINOPHEN 5-325 MG PO TABS
2.0000 | ORAL_TABLET | Freq: Once | ORAL | Status: AC
  Administered 2014-08-28: 2 via ORAL
  Filled 2014-08-28: qty 2

## 2014-08-28 NOTE — ED Provider Notes (Signed)
CSN: [id number]     Arrival date & time 08/28/14  1245 History   First MD Initiated Contact with Patient 08/28/14 [phone]     Chief Complaint  Patient presents with  . Leg Pain     (Consider location/radiation/quality/duration/timing/severity/associated sxs/prior Treatment) HPI Comments: Patient here requesting pain medicine. He states he has some chronic intermittent pain to his lower legs. He states he has chronic pain to his lower extremities and his abdomen related to cancer. 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 He had a recurrence of pain since 3:00 this morning. He states he has pain in all the joints of his lower legs. He does have a history of blood clots and is on Xarelto. However he denies any swelling of the legs or pain in the musculature areas. He has pain mostly in the joints of his knees and his hips. He has had multiple visits for chronic pain over the last few months. He states he was previously in hospice in [facility] but he states they were trying to kill him with all the pain medications and he left and moved here. He is currently establishing care with Dr. Inda Merlin and has an appointment on Thursday for a CT scan. He also recently went to pain management in [place] although they were unable to prescribe him pain medications as they couldn't prescribe the amount of pain medication that he was requesting. He is waiting to change his Medicaid card to a new pain medicine center. He's had 3 prescriptions for pain medications filled this month from different providers and multiple prescriptions told her for the last few months from different providers. He had a prescription for 60 tablets filled on November 5, 15 tablets filled on November 19, and 20 tablets filled on November 21.  Patient is a 53 y.o. male presenting with leg pain.  Leg Pain Associated symptoms: no back pain, no fatigue and no fever     Past Medical History   Diagnosis Date  . Deep vein thrombosis     "several"  . Pulmonary emboli     "several"  . COPD (chronic obstructive pulmonary disease)   . Partial small bowel obstruction   . Myocardial infarction 2007; ~ 2011  . Chronic liver failure   . Shortness of breath     "all the time"  . Oxygen dependent     3L; 24/7" (05/27/2014)  . History of blood transfusion 2013    "related to kidneys shutting down"  . Hepatitis C   . Depression   . Hypertension   . Hypothyroidism   . Asthma   . Pneumonia     "several times"  . GERD (gastroesophageal reflux disease)   . Migraines     "2-3/day" (05/27/2014)  . Stroke ~ 03/2012    "couldn't use my left hand for ~ 6 months" (05/27/2014)  . Stroke 05/25/2014    "not able to use my left hand again" (05/27/2014)  . Arthritis     "I'm eat up w/it"  . Chronic lower back pain   . Anxiety   . Intermittent self-catheterization of bladder   . Liver cancer   . Metastatic lung cancer     "left"  . B12 deficiency     "give myself shots"   Past Surgical History  Procedure Laterality Date  . Transurethral resection of prostate  2012; 2014  . Tonsillectomy and adenoidectomy  1974  . Knee surgery Left ~  1972    "cut top of my kneecap off"  . Multiple tooth extractions  11/2010    "26"  . Incision and drainage abscess Left 05/28/2014    Procedure: INCISION AND DRAINAGE ABSCESS LEFT WRIST;  Surgeon: Leanora Cover, MD;  Location: [facility];  Service: Orthopedics;  Laterality: Left;   Family History  Problem Relation Age of Onset  . Cancer Mother   . Cancer Paternal Aunt   . Cancer Maternal Aunt    History  Substance Use Topics  . Smoking status: Current Every Day Smoker -- 0.50 packs/day for 41 years    Types: Cigarettes  . Smokeless tobacco: Former Systems developer     Comment: "used chewing tobacco when we were little"  . Alcohol Use: Yes    Review of Systems  Constitutional: Negative for fever, chills, diaphoresis and fatigue.  HENT: Negative for congestion,  rhinorrhea and sneezing.   Eyes: Negative.   Respiratory: Negative for cough, chest tightness and shortness of breath.   Cardiovascular: Negative for chest pain and leg swelling.  Gastrointestinal: Negative for nausea, vomiting, abdominal pain, diarrhea and blood in stool.  Genitourinary: Negative for frequency, hematuria, flank pain and difficulty urinating.  Musculoskeletal: Positive for arthralgias. Negative for back pain.  Skin: Negative for rash.  Neurological: Negative for dizziness, speech difficulty, weakness, numbness and headaches.      Allergies  Penicillins and Morphine and related  Home Medications   Prior to Admission medications   Medication Sig Start Date End Date Taking? Authorizing Provider  albuterol (PROVENTIL HFA;VENTOLIN HFA) 108 (90 BASE) MCG/ACT inhaler Inhale 2 puffs into the lungs every 6 (six) hours as needed for wheezing or shortness of breath.    Historical Provider, MD  benzonatate (TESSALON) 100 MG capsule Take 1 capsule (100 mg total) by mouth every 8 (eight) hours. 08/18/14   Ephraim Hamburger, MD  butalbital-acetaminophen-caffeine (ESGIC PLUS) 50-500-40 MG per tablet Take 1 tablet by mouth every 4 (four) hours as needed for pain.    Historical Provider, MD  cyanocobalamin (,VITAMIN B-12,) 1000 MCG/ML injection Inject 1,000 mcg into the muscle once a week. On monday    Historical Provider, MD  diazepam (VALIUM) 10 MG tablet Take 1 tablet (10 mg total) by mouth daily. 06/07/14   Veryl Speak, MD  DULoxetine (CYMBALTA) 60 MG capsule Take 1 capsule (60 mg total) by mouth 2 (two) times daily. 06/07/14   Veryl Speak, MD  gabapentin (NEURONTIN) 400 MG capsule Take 1 capsule (400 mg total) by mouth 3 (three) times daily. 06/07/14   Veryl Speak, MD  Ipratropium-Albuterol (COMBIVENT RESPIMAT) 20-100 MCG/ACT AERS respimat Inhale 1 puff into the lungs daily.    Historical Provider, MD  levothyroxine (SYNTHROID, LEVOTHROID) 50 MCG tablet Take 1 tablet (50 mcg total) by  mouth daily before breakfast. 06/07/14   Veryl Speak, MD  Multiple Vitamins-Minerals (MULTIVITAMIN WITH MINERALS) tablet Take 1 tablet by mouth daily.    Historical Provider, MD  omeprazole (PRILOSEC) 20 MG capsule Take 1 capsule (20 mg total) by mouth 2 (two) times daily. 06/07/14   Veryl Speak, MD  oxyCODONE (OXY IR/ROXICODONE) 5 MG immediate release tablet Take 2 tablets (10 mg total) by mouth every 4 (four) hours as needed for severe pain. 08/18/14   Ephraim Hamburger, MD  Oxycodone HCl 10 MG TABS Take 1-2 tablets (10-20 mg total) by mouth every 6 (six) hours as needed. 08/21/14   Blanchie Dessert, MD  potassium chloride SA (K-DUR,KLOR-CON) 20 MEQ tablet Take 1 tablet (20  mEq total) by mouth at bedtime. 06/07/14   Veryl Speak, MD  promethazine (PHENERGAN) 25 MG tablet Take 1 tablet (25 mg total) by mouth 2 (two) times daily. 06/07/14   Veryl Speak, MD  Rivaroxaban (XARELTO) 15 MG TABS tablet Take 15 mg by mouth 2 (two) times daily with a meal.    Historical Provider, MD  tamsulosin (FLOMAX) 0.4 MG CAPS capsule Take 1 capsule (0.4 mg total) by mouth at bedtime. 06/07/14   Veryl Speak, MD   BP 137/97 mmHg  Pulse 87  Temp(Src) 98 F (36.7 C) (Oral)  Resp 20  Ht 5\' 7"  (1.702 m)  Wt 170 lb (77.111 kg)  BMI 26.62 kg/m2  SpO2 99% Physical Exam  Constitutional: He is oriented to person, place, and time. He appears well-developed and well-nourished.  HENT:  Head: Normocephalic and atraumatic.  Eyes: Pupils are equal, round, and reactive to light.  Neck: Normal range of motion. Neck supple.  Cardiovascular: Normal rate, regular rhythm and normal heart sounds.   Pulmonary/Chest: Effort normal and breath sounds normal. No respiratory distress. He has no wheezes. He has no rales. He exhibits no tenderness.  Abdominal: Soft. Bowel sounds are normal. There is no tenderness. There is no rebound and no guarding.  Musculoskeletal: Normal range of motion. He exhibits no edema.  He has no swelling of the  joints. There is no erythema. There is no edema of the lower extremities. There is no calf tenderness. He has pedal pulses in the feet. Has good color to the lower extremities. There is no wounds.  Lymphadenopathy:    He has no cervical adenopathy.  Neurological: He is alert and oriented to person, place, and time.  Skin: Skin is warm and dry. No rash noted.  Psychiatric: He has a normal mood and affect.    ED Course  Procedures (including critical care time) Labs Review Labs Reviewed - No data to display  Imaging Review No results found.   EKG Interpretation None      MDM   Final diagnoses:  Chronic pain    Patient was given a dose of pain medication in the ED. I explained to him that he needs to establish primary care for outpatient management of his chronic pain.    Malvin Johns, MD 08/28/14 [phone]

## 2014-08-28 NOTE — Discharge Instructions (Signed)

## 2014-08-28 NOTE — ED Notes (Signed)
Pt c/o bilateral leg pain since 3am, states don't have any pain meds at home, states has to start a new pain clinic; denies injury

## 2014-09-02 ENCOUNTER — Ambulatory Visit (HOSPITAL_COMMUNITY): Payer: Medicaid Other

## 2014-09-03 ENCOUNTER — Telehealth: Payer: Self-pay | Admitting: *Deleted

## 2014-09-03 NOTE — Telephone Encounter (Signed)
Per Benedetto Goad in medical mgmt, unable to get CT scan approved due to insufficient data regarding cancer dx.  Tiffany in medical records attempted to find medical records from [facility] regarding previous cancer history.  Per [facility], no hx of patient in the system.  Informed Dr Vista Mink that CT scan cannot be approved at this time.  He states he will address this at next f/u visit on 09/07/14.

## 2014-09-06 ENCOUNTER — Other Ambulatory Visit: Payer: Self-pay | Admitting: Medical Oncology

## 2014-09-06 DIAGNOSIS — C3490 Malignant neoplasm of unspecified part of unspecified bronchus or lung: Secondary | ICD-10-CM

## 2014-09-07 ENCOUNTER — Other Ambulatory Visit: Payer: Self-pay

## 2014-09-07 ENCOUNTER — Ambulatory Visit: Payer: Self-pay | Admitting: Internal Medicine

## 2014-09-13 ENCOUNTER — Ambulatory Visit (HOSPITAL_COMMUNITY): Payer: Medicaid Other

## 2014-09-15 ENCOUNTER — Encounter (HOSPITAL_BASED_OUTPATIENT_CLINIC_OR_DEPARTMENT_OTHER): Payer: Medicaid Other | Attending: General Surgery

## 2014-09-15 DIAGNOSIS — S61502A Unspecified open wound of left wrist, initial encounter: Secondary | ICD-10-CM | POA: Insufficient documentation

## 2014-09-15 DIAGNOSIS — Y838 Other surgical procedures as the cause of abnormal reaction of the patient, or of later complication, without mention of misadventure at the time of the procedure: Secondary | ICD-10-CM | POA: Insufficient documentation

## 2014-09-15 DIAGNOSIS — Z9119 Patient's noncompliance with other medical treatment and regimen: Secondary | ICD-10-CM | POA: Insufficient documentation

## 2014-09-15 DIAGNOSIS — F17210 Nicotine dependence, cigarettes, uncomplicated: Secondary | ICD-10-CM | POA: Insufficient documentation

## 2014-09-15 DIAGNOSIS — T8189XA Other complications of procedures, not elsewhere classified, initial encounter: Secondary | ICD-10-CM | POA: Insufficient documentation

## 2014-09-20 ENCOUNTER — Emergency Department (HOSPITAL_BASED_OUTPATIENT_CLINIC_OR_DEPARTMENT_OTHER)
Admission: EM | Admit: 2014-09-20 | Discharge: 2014-09-20 | Disposition: A | Discharge status: Home / Self Care | Payer: Medicaid Other | Attending: Emergency Medicine | Admitting: Emergency Medicine

## 2014-09-20 ENCOUNTER — Encounter (HOSPITAL_BASED_OUTPATIENT_CLINIC_OR_DEPARTMENT_OTHER): Payer: Self-pay

## 2014-09-20 ENCOUNTER — Emergency Department (HOSPITAL_BASED_OUTPATIENT_CLINIC_OR_DEPARTMENT_OTHER): Payer: Medicaid Other

## 2014-09-20 DIAGNOSIS — F419 Anxiety disorder, unspecified: Secondary | ICD-10-CM | POA: Diagnosis not present

## 2014-09-20 DIAGNOSIS — R05 Cough: Secondary | ICD-10-CM | POA: Diagnosis present

## 2014-09-20 DIAGNOSIS — Z86711 Personal history of pulmonary embolism: Secondary | ICD-10-CM | POA: Diagnosis not present

## 2014-09-20 DIAGNOSIS — Z7951 Long term (current) use of inhaled steroids: Secondary | ICD-10-CM | POA: Insufficient documentation

## 2014-09-20 DIAGNOSIS — I10 Essential (primary) hypertension: Secondary | ICD-10-CM | POA: Diagnosis not present

## 2014-09-20 DIAGNOSIS — G8929 Other chronic pain: Secondary | ICD-10-CM | POA: Diagnosis not present

## 2014-09-20 DIAGNOSIS — Z8701 Personal history of pneumonia (recurrent): Secondary | ICD-10-CM | POA: Diagnosis not present

## 2014-09-20 DIAGNOSIS — E039 Hypothyroidism, unspecified: Secondary | ICD-10-CM | POA: Insufficient documentation

## 2014-09-20 DIAGNOSIS — J449 Chronic obstructive pulmonary disease, unspecified: Secondary | ICD-10-CM | POA: Insufficient documentation

## 2014-09-20 DIAGNOSIS — Z86718 Personal history of other venous thrombosis and embolism: Secondary | ICD-10-CM | POA: Insufficient documentation

## 2014-09-20 DIAGNOSIS — I252 Old myocardial infarction: Secondary | ICD-10-CM | POA: Insufficient documentation

## 2014-09-20 DIAGNOSIS — E538 Deficiency of other specified B group vitamins: Secondary | ICD-10-CM | POA: Diagnosis not present

## 2014-09-20 DIAGNOSIS — Z8619 Personal history of other infectious and parasitic diseases: Secondary | ICD-10-CM | POA: Diagnosis not present

## 2014-09-20 DIAGNOSIS — Z880 Allergy status to penicillin: Secondary | ICD-10-CM | POA: Insufficient documentation

## 2014-09-20 DIAGNOSIS — R1013 Epigastric pain: Secondary | ICD-10-CM | POA: Diagnosis not present

## 2014-09-20 DIAGNOSIS — Y838 Other surgical procedures as the cause of abnormal reaction of the patient, or of later complication, without mention of misadventure at the time of the procedure: Secondary | ICD-10-CM | POA: Diagnosis not present

## 2014-09-20 DIAGNOSIS — Z85118 Personal history of other malignant neoplasm of bronchus and lung: Secondary | ICD-10-CM | POA: Insufficient documentation

## 2014-09-20 DIAGNOSIS — Z9119 Patient's noncompliance with other medical treatment and regimen: Secondary | ICD-10-CM | POA: Diagnosis not present

## 2014-09-20 DIAGNOSIS — T8189XA Other complications of procedures, not elsewhere classified, initial encounter: Secondary | ICD-10-CM | POA: Diagnosis present

## 2014-09-20 DIAGNOSIS — Z936 Other artificial openings of urinary tract status: Secondary | ICD-10-CM | POA: Insufficient documentation

## 2014-09-20 DIAGNOSIS — Z8673 Personal history of transient ischemic attack (TIA), and cerebral infarction without residual deficits: Secondary | ICD-10-CM | POA: Insufficient documentation

## 2014-09-20 DIAGNOSIS — K219 Gastro-esophageal reflux disease without esophagitis: Secondary | ICD-10-CM | POA: Diagnosis not present

## 2014-09-20 DIAGNOSIS — Z79899 Other long term (current) drug therapy: Secondary | ICD-10-CM | POA: Insufficient documentation

## 2014-09-20 DIAGNOSIS — Z9981 Dependence on supplemental oxygen: Secondary | ICD-10-CM | POA: Diagnosis not present

## 2014-09-20 DIAGNOSIS — Z8505 Personal history of malignant neoplasm of liver: Secondary | ICD-10-CM | POA: Diagnosis not present

## 2014-09-20 DIAGNOSIS — Z7901 Long term (current) use of anticoagulants: Secondary | ICD-10-CM | POA: Diagnosis not present

## 2014-09-20 DIAGNOSIS — F17210 Nicotine dependence, cigarettes, uncomplicated: Secondary | ICD-10-CM | POA: Diagnosis not present

## 2014-09-20 DIAGNOSIS — S61502A Unspecified open wound of left wrist, initial encounter: Secondary | ICD-10-CM | POA: Diagnosis not present

## 2014-09-20 DIAGNOSIS — R0789 Other chest pain: Secondary | ICD-10-CM | POA: Diagnosis not present

## 2014-09-20 DIAGNOSIS — F329 Major depressive disorder, single episode, unspecified: Secondary | ICD-10-CM | POA: Diagnosis not present

## 2014-09-20 DIAGNOSIS — R059 Cough, unspecified: Secondary | ICD-10-CM

## 2014-09-20 DIAGNOSIS — Z720 Tobacco use: Secondary | ICD-10-CM | POA: Insufficient documentation

## 2014-09-20 LAB — COMPREHENSIVE METABOLIC PANEL
ALT: 44 U/L (ref 0–53)
ANION GAP: 18 — AB (ref 5–15)
AST: 36 U/L (ref 0–37)
Albumin: 4.1 g/dL (ref 3.5–5.2)
Alkaline Phosphatase: 67 U/L (ref 39–117)
BILIRUBIN TOTAL: 0.3 mg/dL (ref 0.3–1.2)
BUN: 9 mg/dL (ref 6–23)
CHLORIDE: 98 meq/L (ref 96–112)
CO2: 20 mEq/L (ref 19–32)
CREATININE: 0.9 mg/dL (ref 0.50–1.35)
Calcium: 9.8 mg/dL (ref 8.4–10.5)
GFR calc Af Amer: >90 mL/min (ref >90)
GFR calc non Af Amer: >90 mL/min (ref >90)
GLUCOSE: 108 mg/dL — AB (ref 70–99)
Potassium: 4.7 mEq/L (ref 3.7–5.3)
Sodium: 136 mEq/L — ABNORMAL LOW (ref 137–147)
Total Protein: 8.3 g/dL (ref 6.0–8.3)

## 2014-09-20 LAB — CBC WITH DIFFERENTIAL/PLATELET
BASOS ABS: 0 10*3/uL (ref 0.0–0.1)
Basophils Relative: 0 % (ref 0–1)
Eosinophils Absolute: 0.2 10*3/uL (ref 0.0–0.7)
Eosinophils Relative: 1 % (ref 0–5)
HCT: 43.6 % (ref 39.0–52.0)
Hemoglobin: 14.9 g/dL (ref 13.0–17.0)
LYMPHS ABS: 2.4 10*3/uL (ref 0.7–4.0)
Lymphocytes Relative: 16 % (ref 12–46)
MCH: 30.9 pg (ref 26.0–34.0)
MCHC: 34.2 g/dL (ref 30.0–36.0)
MCV: 90.5 fL (ref 78.0–100.0)
Monocytes Absolute: 1.1 10*3/uL — ABNORMAL HIGH (ref 0.1–1.0)
Monocytes Relative: 7 % (ref 3–12)
Neutro Abs: 11.4 10*3/uL — ABNORMAL HIGH (ref 1.7–7.7)
Neutrophils Relative %: 76 % (ref 43–77)
PLATELETS: 360 10*3/uL (ref 150–400)
RBC: 4.82 MIL/uL (ref 4.22–5.81)
RDW: 17 % — ABNORMAL HIGH (ref 11.5–15.5)
WBC: 15.1 10*3/uL — ABNORMAL HIGH (ref 4.0–10.5)

## 2014-09-20 LAB — TROPONIN I: Troponin I: <0.3 ng/mL (ref <0.30)

## 2014-09-20 MED ORDER — OXYCODONE-ACETAMINOPHEN 5-325 MG PO TABS: 1.0000 | ORAL_TABLET | Freq: Four times a day (QID) | ORAL | Status: DC | PRN

## 2014-09-20 MED ORDER — ALBUTEROL SULFATE HFA 108 (90 BASE) MCG/ACT IN AERS: 1.0000 | INHALATION_SPRAY | Freq: Four times a day (QID) | RESPIRATORY_TRACT | Status: DC | PRN

## 2014-09-20 MED ORDER — AZITHROMYCIN 250 MG PO TABS: ORAL_TABLET | ORAL | Status: DC

## 2014-09-20 MED ORDER — PREDNISONE 10 MG PO TABS: 40.0000 mg | ORAL_TABLET | Freq: Every day | ORAL | Status: DC

## 2014-09-20 NOTE — Discharge Instructions (Signed)

## 2014-09-20 NOTE — ED Notes (Signed)
Patient out to the desk wanting to leave and go out to car. Patient given rules and returned to room.
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
## 2014-10-04 ENCOUNTER — Encounter (HOSPITAL_BASED_OUTPATIENT_CLINIC_OR_DEPARTMENT_OTHER): Payer: Medicaid Other | Attending: Plastic Surgery

## 2014-10-13 ENCOUNTER — Encounter (HOSPITAL_BASED_OUTPATIENT_CLINIC_OR_DEPARTMENT_OTHER): Payer: Self-pay

## 2014-10-13 ENCOUNTER — Emergency Department (HOSPITAL_BASED_OUTPATIENT_CLINIC_OR_DEPARTMENT_OTHER): Payer: Medicaid Other

## 2014-10-13 ENCOUNTER — Emergency Department (HOSPITAL_BASED_OUTPATIENT_CLINIC_OR_DEPARTMENT_OTHER)
Admission: EM | Admit: 2014-10-13 | Discharge: 2014-10-13 | Disposition: A | Discharge status: Home / Self Care | Payer: Medicaid Other | Attending: Emergency Medicine | Admitting: Emergency Medicine

## 2014-10-13 DIAGNOSIS — R079 Chest pain, unspecified: Secondary | ICD-10-CM | POA: Diagnosis present

## 2014-10-13 DIAGNOSIS — S299XXS Unspecified injury of thorax, sequela: Secondary | ICD-10-CM | POA: Diagnosis not present

## 2014-10-13 DIAGNOSIS — Z8701 Personal history of pneumonia (recurrent): Secondary | ICD-10-CM | POA: Diagnosis not present

## 2014-10-13 DIAGNOSIS — Z720 Tobacco use: Secondary | ICD-10-CM | POA: Insufficient documentation

## 2014-10-13 DIAGNOSIS — E039 Hypothyroidism, unspecified: Secondary | ICD-10-CM | POA: Diagnosis not present

## 2014-10-13 DIAGNOSIS — Z8619 Personal history of other infectious and parasitic diseases: Secondary | ICD-10-CM | POA: Diagnosis not present

## 2014-10-13 DIAGNOSIS — Z760 Encounter for issue of repeat prescription: Secondary | ICD-10-CM | POA: Diagnosis not present

## 2014-10-13 DIAGNOSIS — E538 Deficiency of other specified B group vitamins: Secondary | ICD-10-CM | POA: Insufficient documentation

## 2014-10-13 DIAGNOSIS — Z7901 Long term (current) use of anticoagulants: Secondary | ICD-10-CM | POA: Insufficient documentation

## 2014-10-13 DIAGNOSIS — J449 Chronic obstructive pulmonary disease, unspecified: Secondary | ICD-10-CM | POA: Diagnosis not present

## 2014-10-13 DIAGNOSIS — Z8673 Personal history of transient ischemic attack (TIA), and cerebral infarction without residual deficits: Secondary | ICD-10-CM | POA: Insufficient documentation

## 2014-10-13 DIAGNOSIS — Z86711 Personal history of pulmonary embolism: Secondary | ICD-10-CM | POA: Insufficient documentation

## 2014-10-13 DIAGNOSIS — Z9981 Dependence on supplemental oxygen: Secondary | ICD-10-CM | POA: Insufficient documentation

## 2014-10-13 DIAGNOSIS — Z79899 Other long term (current) drug therapy: Secondary | ICD-10-CM | POA: Diagnosis not present

## 2014-10-13 DIAGNOSIS — C7802 Secondary malignant neoplasm of left lung: Secondary | ICD-10-CM | POA: Insufficient documentation

## 2014-10-13 DIAGNOSIS — G8929 Other chronic pain: Secondary | ICD-10-CM | POA: Diagnosis not present

## 2014-10-13 DIAGNOSIS — C229 Malignant neoplasm of liver, not specified as primary or secondary: Secondary | ICD-10-CM | POA: Insufficient documentation

## 2014-10-13 DIAGNOSIS — G8911 Acute pain due to trauma: Secondary | ICD-10-CM | POA: Diagnosis not present

## 2014-10-13 DIAGNOSIS — K219 Gastro-esophageal reflux disease without esophagitis: Secondary | ICD-10-CM | POA: Insufficient documentation

## 2014-10-13 DIAGNOSIS — I10 Essential (primary) hypertension: Secondary | ICD-10-CM | POA: Insufficient documentation

## 2014-10-13 DIAGNOSIS — F329 Major depressive disorder, single episode, unspecified: Secondary | ICD-10-CM | POA: Insufficient documentation

## 2014-10-13 DIAGNOSIS — M1990 Unspecified osteoarthritis, unspecified site: Secondary | ICD-10-CM | POA: Diagnosis not present

## 2014-10-13 DIAGNOSIS — I252 Old myocardial infarction: Secondary | ICD-10-CM | POA: Insufficient documentation

## 2014-10-13 DIAGNOSIS — Z86718 Personal history of other venous thrombosis and embolism: Secondary | ICD-10-CM | POA: Insufficient documentation

## 2014-10-13 DIAGNOSIS — G43909 Migraine, unspecified, not intractable, without status migrainosus: Secondary | ICD-10-CM | POA: Insufficient documentation

## 2014-10-13 DIAGNOSIS — Z7952 Long term (current) use of systemic steroids: Secondary | ICD-10-CM | POA: Insufficient documentation

## 2014-10-13 DIAGNOSIS — Z880 Allergy status to penicillin: Secondary | ICD-10-CM | POA: Diagnosis not present

## 2014-10-13 DIAGNOSIS — F419 Anxiety disorder, unspecified: Secondary | ICD-10-CM | POA: Diagnosis not present

## 2014-10-13 LAB — BASIC METABOLIC PANEL
Anion gap: 9 (ref 5–15)
BUN: 11 mg/dL (ref 6–23)
CHLORIDE: 104 meq/L (ref 96–112)
CO2: 26 mmol/L (ref 19–32)
Calcium: 9 mg/dL (ref 8.4–10.5)
Creatinine, Ser: 0.86 mg/dL (ref 0.50–1.35)
GFR calc Af Amer: >90 mL/min (ref >90)
Glucose, Bld: 135 mg/dL — ABNORMAL HIGH (ref 70–99)
Potassium: 4.1 mmol/L (ref 3.5–5.1)
SODIUM: 139 mmol/L (ref 135–145)

## 2014-10-13 LAB — CBC WITH DIFFERENTIAL/PLATELET
Band Neutrophils: 11 % — ABNORMAL HIGH (ref 0–10)
Basophils Absolute: 0 10*3/uL (ref 0.0–0.1)
Basophils Relative: 0 % (ref 0–1)
Blasts: 0 %
EOS PCT: 0 % (ref 0–5)
Eosinophils Absolute: 0 10*3/uL (ref 0.0–0.7)
HCT: 39.8 % (ref 39.0–52.0)
HEMOGLOBIN: 13.2 g/dL (ref 13.0–17.0)
LYMPHS ABS: 1.2 10*3/uL (ref 0.7–4.0)
Lymphocytes Relative: 8 % — ABNORMAL LOW (ref 12–46)
MCH: 30.5 pg (ref 26.0–34.0)
MCHC: 33.2 g/dL (ref 30.0–36.0)
MCV: 91.9 fL (ref 78.0–100.0)
METAMYELOCYTES PCT: 1 %
MONO ABS: 0.3 10*3/uL (ref 0.1–1.0)
MYELOCYTES: 0 %
Monocytes Relative: 2 % — ABNORMAL LOW (ref 3–12)
NEUTROS ABS: 12.9 10*3/uL — AB (ref 1.7–7.7)
Neutrophils Relative %: 78 % — ABNORMAL HIGH (ref 43–77)
Platelets: 346 10*3/uL (ref 150–400)
Promyelocytes Absolute: 0 %
RBC: 4.33 MIL/uL (ref 4.22–5.81)
RDW: 15.5 % (ref 11.5–15.5)
WBC: 14.4 10*3/uL — AB (ref 4.0–10.5)
nRBC: 0 /100 WBC

## 2014-10-13 MED ORDER — LIDOCAINE 5 % EX PTCH: 1.0000 | MEDICATED_PATCH | CUTANEOUS | Status: DC

## 2014-10-13 MED ORDER — ETODOLAC 400 MG PO TABS: 400.0000 mg | ORAL_TABLET | Freq: Two times a day (BID) | ORAL | Status: DC | PRN

## 2014-10-13 NOTE — Discharge Instructions (Signed)
Chronic Pain Chronic pain can be defined as pain that is off and on and lasts for 3-6 months or longer. Many things cause chronic pain, which can make it difficult to make a diagnosis. There are many treatment options available for chronic pain. However, finding a treatment that works well for you may require trying various approaches until the right one is found. Many people benefit from a combination of two or more types of treatment to control their pain. SYMPTOMS  Chronic pain can occur anywhere in the body and can range from mild to very severe. Some types of chronic pain include:  Headache.  Low back pain.  Cancer pain.  Arthritis pain.  Neurogenic pain. This is pain resulting from damage to nerves. People with chronic pain may also have other symptoms such as:  Depression.  Anger.  Insomnia.  Anxiety. DIAGNOSIS  Your health care provider will help diagnose your condition over time. In many cases, the initial focus will be on excluding possible conditions that could be causing the pain. Depending on your symptoms, your health care provider may order tests to diagnose your condition. Some of these tests may include:   Blood tests.   CT scan.   MRI.   X-rays.   Ultrasounds.   Nerve conduction studies.  You may need to see a specialist.  TREATMENT  Finding treatment that works well may take time. You may be referred to a pain specialist. He or she may prescribe medicine or therapies, such as:   Mindful meditation or yoga.  Shots (injections) of numbing or pain-relieving medicines into the spine or area of pain.  Local electrical stimulation.  Acupuncture.   Massage therapy.   Aroma, color, light, or sound therapy.   Biofeedback.   Working with a physical therapist to keep from getting stiff.   Regular, gentle exercise.   Cognitive or behavioral therapy.   Group support.  Sometimes, surgery may be recommended.  HOME CARE INSTRUCTIONS    Take all medicines as directed by your health care provider.   Lessen stress in your life by relaxing and doing things such as listening to calming music.   Exercise or be active as directed by your health care provider.   Eat a healthy diet and include things such as vegetables, fruits, fish, and lean meats in your diet.   Keep all follow-up appointments with your health care provider.   Attend a support group with others suffering from chronic pain. SEEK MEDICAL CARE IF:   Your pain gets worse.   You develop a new pain that was not there before.   You cannot tolerate medicines given to you by your health care provider.   You have new symptoms since your last visit with your health care provider.  SEEK IMMEDIATE MEDICAL CARE IF:   You feel weak.   You have decreased sensation or numbness.   You lose control of bowel or bladder function.   Your pain suddenly gets much worse.   You develop shaking.  You develop chills.  You develop confusion.  You develop chest pain.  You develop shortness of breath.  MAKE SURE YOU:  Understand these instructions.  Will watch your condition.  Will get help right away if you are not doing well or get worse. Document Released: 06/09/2002 Document Revised: 05/20/2013 Document Reviewed: 03/13/2013 [facility] Patient Information 2015 [place], [place]. This information is not intended to replace advice given to you by your health care provider. Make sure you discuss any  questions you have with your health care provider. Chest Contusion A chest contusion is a deep bruise on your chest area. Contusions are the result of an injury that caused bleeding under the skin. A chest contusion may involve bruising of the skin, muscles, or ribs. The contusion may turn blue, purple, or yellow. Minor injuries will give you a painless contusion, but more severe contusions may stay painful and swollen for a few weeks. CAUSES  A contusion is  usually caused by a blow, trauma, or direct force to an area of the body. SYMPTOMS   Swelling and redness of the injured area.  Discoloration of the injured area.  Tenderness and soreness of the injured area.  Pain. DIAGNOSIS  The diagnosis can be made by taking a history and performing a physical exam. An X-ray, CT scan, or MRI may be needed to determine if there were any associated injuries, such as broken bones (fractures) or internal injuries. TREATMENT  Often, the best treatment for a chest contusion is resting, icing, and applying cold compresses to the injured area. Deep breathing exercises may be recommended to reduce the risk of pneumonia. Over-the-counter medicines may also be recommended for pain control. HOME CARE INSTRUCTIONS   Put ice on the injured area.  Put ice in a plastic bag.  Place a towel between your skin and the bag.  Leave the ice on for 15-20 minutes, 03-04 times a day.  Only take over-the-counter or prescription medicines as directed by your caregiver. Your caregiver may recommend avoiding anti-inflammatory medicines (aspirin, ibuprofen, and naproxen) for 48 hours because these medicines may increase bruising.  Rest the injured area.  Perform deep-breathing exercises as directed by your caregiver.  Stop smoking if you smoke.  Do not lift objects over 5 pounds (2.3 kg) for 3 days or longer if recommended by your caregiver. SEEK IMMEDIATE MEDICAL CARE IF:   You have increased bruising or swelling.  You have pain that is getting worse.  You have difficulty breathing.  You have dizziness, weakness, or fainting.  You have blood in your urine or stool.  You cough up or vomit blood.  Your swelling or pain is not relieved with medicines. MAKE SURE YOU:   Understand these instructions.  Will watch your condition.  Will get help right away if you are not doing well or get worse. Document Released: 06/12/2001 Document Revised: 06/11/2012 Document  Reviewed: 03/10/2012 [facility] Patient Information 2015 [place], [place]. This information is not intended to replace advice given to you by your health care provider. Make sure you discuss any questions you have with your health care provider.

## 2014-10-13 NOTE — ED Notes (Signed)
Pt reports pain in chest from fall earlier in the week.  Pt slurring words in triage.
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
## 2014-10-13 NOTE — ED Notes (Signed)
Pt stated he would give [place] 5 minutes and he was then leaving,  Explained to pt that we were waiting for x ray report

## 2014-10-13 NOTE — ED Notes (Signed)
Pt wanting pain medication, sts "I tried to make it to Friday."
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
## 2014-10-14 NOTE — Progress Notes (Signed)
ED CM received prior authorization form from [place]. Prior Auth obtained from [place] #[id number] faxed form to [phone]. Attempted to update patient, unable to reach at phone number listed in record

## 2014-11-09 ENCOUNTER — Encounter (HOSPITAL_BASED_OUTPATIENT_CLINIC_OR_DEPARTMENT_OTHER): Payer: Self-pay | Admitting: Emergency Medicine

## 2014-11-09 ENCOUNTER — Emergency Department (HOSPITAL_BASED_OUTPATIENT_CLINIC_OR_DEPARTMENT_OTHER)
Admission: EM | Admit: 2014-11-09 | Discharge: 2014-11-09 | Disposition: A | Discharge status: Home / Self Care | Payer: Medicaid Other | Attending: Emergency Medicine | Admitting: Emergency Medicine

## 2014-11-09 DIAGNOSIS — Z936 Other artificial openings of urinary tract status: Secondary | ICD-10-CM | POA: Diagnosis not present

## 2014-11-09 DIAGNOSIS — G8911 Acute pain due to trauma: Secondary | ICD-10-CM | POA: Diagnosis not present

## 2014-11-09 DIAGNOSIS — Z87828 Personal history of other (healed) physical injury and trauma: Secondary | ICD-10-CM | POA: Diagnosis not present

## 2014-11-09 DIAGNOSIS — G8929 Other chronic pain: Secondary | ICD-10-CM | POA: Insufficient documentation

## 2014-11-09 DIAGNOSIS — M79606 Pain in leg, unspecified: Secondary | ICD-10-CM | POA: Diagnosis not present

## 2014-11-09 DIAGNOSIS — J449 Chronic obstructive pulmonary disease, unspecified: Secondary | ICD-10-CM | POA: Insufficient documentation

## 2014-11-09 DIAGNOSIS — Z8673 Personal history of transient ischemic attack (TIA), and cerebral infarction without residual deficits: Secondary | ICD-10-CM | POA: Insufficient documentation

## 2014-11-09 DIAGNOSIS — E538 Deficiency of other specified B group vitamins: Secondary | ICD-10-CM | POA: Insufficient documentation

## 2014-11-09 DIAGNOSIS — F419 Anxiety disorder, unspecified: Secondary | ICD-10-CM | POA: Diagnosis not present

## 2014-11-09 DIAGNOSIS — Z9981 Dependence on supplemental oxygen: Secondary | ICD-10-CM | POA: Diagnosis not present

## 2014-11-09 DIAGNOSIS — Z720 Tobacco use: Secondary | ICD-10-CM | POA: Diagnosis not present

## 2014-11-09 DIAGNOSIS — Z86718 Personal history of other venous thrombosis and embolism: Secondary | ICD-10-CM | POA: Diagnosis not present

## 2014-11-09 DIAGNOSIS — Z79899 Other long term (current) drug therapy: Secondary | ICD-10-CM | POA: Insufficient documentation

## 2014-11-09 DIAGNOSIS — Z7901 Long term (current) use of anticoagulants: Secondary | ICD-10-CM | POA: Diagnosis not present

## 2014-11-09 DIAGNOSIS — Z8505 Personal history of malignant neoplasm of liver: Secondary | ICD-10-CM | POA: Insufficient documentation

## 2014-11-09 DIAGNOSIS — F329 Major depressive disorder, single episode, unspecified: Secondary | ICD-10-CM | POA: Diagnosis not present

## 2014-11-09 DIAGNOSIS — Z86711 Personal history of pulmonary embolism: Secondary | ICD-10-CM | POA: Diagnosis not present

## 2014-11-09 DIAGNOSIS — Z880 Allergy status to penicillin: Secondary | ICD-10-CM | POA: Insufficient documentation

## 2014-11-09 DIAGNOSIS — I10 Essential (primary) hypertension: Secondary | ICD-10-CM | POA: Diagnosis not present

## 2014-11-09 DIAGNOSIS — K219 Gastro-esophageal reflux disease without esophagitis: Secondary | ICD-10-CM | POA: Diagnosis not present

## 2014-11-09 DIAGNOSIS — E039 Hypothyroidism, unspecified: Secondary | ICD-10-CM | POA: Diagnosis not present

## 2014-11-09 DIAGNOSIS — Z8619 Personal history of other infectious and parasitic diseases: Secondary | ICD-10-CM | POA: Diagnosis not present

## 2014-11-09 DIAGNOSIS — Z85118 Personal history of other malignant neoplasm of bronchus and lung: Secondary | ICD-10-CM | POA: Insufficient documentation

## 2014-11-09 DIAGNOSIS — I252 Old myocardial infarction: Secondary | ICD-10-CM | POA: Insufficient documentation

## 2014-11-09 DIAGNOSIS — Z7952 Long term (current) use of systemic steroids: Secondary | ICD-10-CM | POA: Insufficient documentation

## 2014-11-09 DIAGNOSIS — Z8701 Personal history of pneumonia (recurrent): Secondary | ICD-10-CM | POA: Diagnosis not present

## 2014-11-09 DIAGNOSIS — G43909 Migraine, unspecified, not intractable, without status migrainosus: Secondary | ICD-10-CM | POA: Insufficient documentation

## 2014-11-09 MED ORDER — ONDANSETRON 4 MG PO TBDP
4.0000 mg | ORAL_TABLET | Freq: Once | ORAL | Status: AC
  Administered 2014-11-09: 4 mg via ORAL
  Filled 2014-11-09: qty 1

## 2014-11-09 MED ORDER — OXYCODONE-ACETAMINOPHEN 5-325 MG PO TABS
2.0000 | ORAL_TABLET | Freq: Once | ORAL | Status: AC
  Administered 2014-11-09: 2 via ORAL
  Filled 2014-11-09: qty 2
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
## 2014-11-09 NOTE — Discharge Instructions (Signed)
Return to the ED with any concerns including difficulty breathing, vomiting and not able to keep down liquids, fainting, decreased level of alertness/lethargy, or any other alarming symptoms
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
## 2015-01-27 ENCOUNTER — Telehealth: Payer: Self-pay | Admitting: Medical Oncology

## 2015-01-27 ENCOUNTER — Other Ambulatory Visit: Payer: Self-pay | Admitting: Medical Oncology

## 2015-01-27 NOTE — Telephone Encounter (Signed)
I left pt a voice mail to call me back because we need old records from [facility]. There are no records in care everywhere. I recommend he go to [facility] to get records.

## 2015-02-16 ENCOUNTER — Telehealth: Payer: Self-pay | Admitting: *Deleted

## 2015-02-16 DIAGNOSIS — C3490 Malignant neoplasm of unspecified part of unspecified bronchus or lung: Secondary | ICD-10-CM

## 2015-02-16 NOTE — Telephone Encounter (Signed)
Dr. York Ram called with this mutual patient in office.  "Robert [name] was ill and did not make his MRI appointment. I'm calling to have this rescheduled."  Observed CT orders but no MRI.  Robert [name] was seen August 24, 2014 as new patient with no future appointments.  December 2015 CT CAP were cancelled due to no precertification.  Dr. [name] may need to see patient before re-ordering scans.  Will notify Dr. [name].  Dr. Jimmye Norman reports "patient having increased or persistent pain with bruising/discoloration of lower spine.  Dr. Jimmye Norman may be reached at [phone].  Patient's return number confirmed as [phone].

## 2015-02-16 NOTE — Telephone Encounter (Signed)
[place] with first available.

## 2015-02-17 ENCOUNTER — Other Ambulatory Visit: Payer: Self-pay | Admitting: Medical Oncology

## 2015-02-17 NOTE — Telephone Encounter (Signed)
Onc tx request sent  

## 2015-02-18 ENCOUNTER — Telehealth: Payer: Self-pay | Admitting: Internal Medicine

## 2015-02-18 NOTE — Telephone Encounter (Signed)
Spoke with patient and he is aware of his appointment

## 2015-02-22 ENCOUNTER — Emergency Department (HOSPITAL_COMMUNITY): Payer: Medicaid Other

## 2015-02-22 ENCOUNTER — Encounter (HOSPITAL_COMMUNITY): Payer: Self-pay

## 2015-02-22 ENCOUNTER — Emergency Department (HOSPITAL_COMMUNITY)
Admission: EM | Admit: 2015-02-22 | Discharge: 2015-02-22 | Disposition: A | Discharge status: Home / Self Care | Payer: Medicaid Other | Attending: Emergency Medicine | Admitting: Emergency Medicine

## 2015-02-22 DIAGNOSIS — Z880 Allergy status to penicillin: Secondary | ICD-10-CM | POA: Insufficient documentation

## 2015-02-22 DIAGNOSIS — G43909 Migraine, unspecified, not intractable, without status migrainosus: Secondary | ICD-10-CM | POA: Insufficient documentation

## 2015-02-22 DIAGNOSIS — S299XXA Unspecified injury of thorax, initial encounter: Secondary | ICD-10-CM | POA: Insufficient documentation

## 2015-02-22 DIAGNOSIS — E039 Hypothyroidism, unspecified: Secondary | ICD-10-CM | POA: Insufficient documentation

## 2015-02-22 DIAGNOSIS — J449 Chronic obstructive pulmonary disease, unspecified: Secondary | ICD-10-CM | POA: Insufficient documentation

## 2015-02-22 DIAGNOSIS — Z7952 Long term (current) use of systemic steroids: Secondary | ICD-10-CM | POA: Diagnosis not present

## 2015-02-22 DIAGNOSIS — S00432A Contusion of left ear, initial encounter: Secondary | ICD-10-CM | POA: Diagnosis not present

## 2015-02-22 DIAGNOSIS — I10 Essential (primary) hypertension: Secondary | ICD-10-CM | POA: Diagnosis not present

## 2015-02-22 DIAGNOSIS — S8011XD Contusion of right lower leg, subsequent encounter: Secondary | ICD-10-CM | POA: Insufficient documentation

## 2015-02-22 DIAGNOSIS — Y998 Other external cause status: Secondary | ICD-10-CM | POA: Diagnosis not present

## 2015-02-22 DIAGNOSIS — S8992XA Unspecified injury of left lower leg, initial encounter: Secondary | ICD-10-CM | POA: Insufficient documentation

## 2015-02-22 DIAGNOSIS — S60221D Contusion of right hand, subsequent encounter: Secondary | ICD-10-CM | POA: Diagnosis not present

## 2015-02-22 DIAGNOSIS — Z9981 Dependence on supplemental oxygen: Secondary | ICD-10-CM | POA: Diagnosis not present

## 2015-02-22 DIAGNOSIS — Z791 Long term (current) use of non-steroidal anti-inflammatories (NSAID): Secondary | ICD-10-CM | POA: Insufficient documentation

## 2015-02-22 DIAGNOSIS — Z720 Tobacco use: Secondary | ICD-10-CM | POA: Diagnosis not present

## 2015-02-22 DIAGNOSIS — G8929 Other chronic pain: Secondary | ICD-10-CM | POA: Insufficient documentation

## 2015-02-22 DIAGNOSIS — Y9389 Activity, other specified: Secondary | ICD-10-CM | POA: Diagnosis not present

## 2015-02-22 DIAGNOSIS — K219 Gastro-esophageal reflux disease without esophagitis: Secondary | ICD-10-CM | POA: Diagnosis not present

## 2015-02-22 DIAGNOSIS — E876 Hypokalemia: Secondary | ICD-10-CM | POA: Diagnosis not present

## 2015-02-22 DIAGNOSIS — Y92009 Unspecified place in unspecified non-institutional (private) residence as the place of occurrence of the external cause: Secondary | ICD-10-CM | POA: Diagnosis not present

## 2015-02-22 DIAGNOSIS — Z792 Long term (current) use of antibiotics: Secondary | ICD-10-CM | POA: Diagnosis not present

## 2015-02-22 DIAGNOSIS — W19XXXA Unspecified fall, initial encounter: Secondary | ICD-10-CM

## 2015-02-22 DIAGNOSIS — G894 Chronic pain syndrome: Secondary | ICD-10-CM

## 2015-02-22 DIAGNOSIS — K140 Glossitis: Secondary | ICD-10-CM | POA: Diagnosis not present

## 2015-02-22 DIAGNOSIS — Z8673 Personal history of transient ischemic attack (TIA), and cerebral infarction without residual deficits: Secondary | ICD-10-CM | POA: Diagnosis not present

## 2015-02-22 DIAGNOSIS — Z85118 Personal history of other malignant neoplasm of bronchus and lung: Secondary | ICD-10-CM | POA: Insufficient documentation

## 2015-02-22 DIAGNOSIS — F329 Major depressive disorder, single episode, unspecified: Secondary | ICD-10-CM | POA: Insufficient documentation

## 2015-02-22 DIAGNOSIS — W1830XA Fall on same level, unspecified, initial encounter: Secondary | ICD-10-CM | POA: Diagnosis not present

## 2015-02-22 DIAGNOSIS — I252 Old myocardial infarction: Secondary | ICD-10-CM | POA: Diagnosis not present

## 2015-02-22 DIAGNOSIS — S60222D Contusion of left hand, subsequent encounter: Secondary | ICD-10-CM | POA: Insufficient documentation

## 2015-02-22 DIAGNOSIS — Z8505 Personal history of malignant neoplasm of liver: Secondary | ICD-10-CM | POA: Diagnosis not present

## 2015-02-22 DIAGNOSIS — Z7901 Long term (current) use of anticoagulants: Secondary | ICD-10-CM | POA: Insufficient documentation

## 2015-02-22 DIAGNOSIS — F419 Anxiety disorder, unspecified: Secondary | ICD-10-CM | POA: Insufficient documentation

## 2015-02-22 DIAGNOSIS — Z8701 Personal history of pneumonia (recurrent): Secondary | ICD-10-CM | POA: Insufficient documentation

## 2015-02-22 DIAGNOSIS — S300XXD Contusion of lower back and pelvis, subsequent encounter: Secondary | ICD-10-CM | POA: Diagnosis not present

## 2015-02-22 DIAGNOSIS — S0990XA Unspecified injury of head, initial encounter: Secondary | ICD-10-CM | POA: Insufficient documentation

## 2015-02-22 DIAGNOSIS — Z8619 Personal history of other infectious and parasitic diseases: Secondary | ICD-10-CM | POA: Insufficient documentation

## 2015-02-22 DIAGNOSIS — Z86711 Personal history of pulmonary embolism: Secondary | ICD-10-CM | POA: Diagnosis not present

## 2015-02-22 DIAGNOSIS — E538 Deficiency of other specified B group vitamins: Secondary | ICD-10-CM | POA: Diagnosis not present

## 2015-02-22 DIAGNOSIS — S8012XD Contusion of left lower leg, subsequent encounter: Secondary | ICD-10-CM | POA: Diagnosis not present

## 2015-02-22 LAB — CBC WITH DIFFERENTIAL/PLATELET
Basophils Absolute: 0 10*3/uL (ref 0.0–0.1)
Basophils Relative: 0 % (ref 0–1)
Eosinophils Absolute: 0 10*3/uL (ref 0.0–0.7)
Eosinophils Relative: 0 % (ref 0–5)
HCT: 34.3 % — ABNORMAL LOW (ref 39.0–52.0)
Hemoglobin: 11.2 g/dL — ABNORMAL LOW (ref 13.0–17.0)
Lymphocytes Relative: 6 % — ABNORMAL LOW (ref 12–46)
Lymphs Abs: 0.8 10*3/uL (ref 0.7–4.0)
MCH: 29.5 pg (ref 26.0–34.0)
MCHC: 32.7 g/dL (ref 30.0–36.0)
MCV: 90.3 fL (ref 78.0–100.0)
Monocytes Absolute: 0.7 10*3/uL (ref 0.1–1.0)
Monocytes Relative: 6 % (ref 3–12)
Neutro Abs: 11.4 10*3/uL — ABNORMAL HIGH (ref 1.7–7.7)
Neutrophils Relative %: 88 % — ABNORMAL HIGH (ref 43–77)
Platelets: 322 10*3/uL (ref 150–400)
RBC: 3.8 MIL/uL — ABNORMAL LOW (ref 4.22–5.81)
RDW: 16.7 % — ABNORMAL HIGH (ref 11.5–15.5)
WBC: 12.9 10*3/uL — ABNORMAL HIGH (ref 4.0–10.5)

## 2015-02-22 LAB — PROTIME-INR
INR: 1.01 (ref 0.00–1.49)
Prothrombin Time: 13.5 seconds (ref 11.6–15.2)

## 2015-02-22 LAB — BASIC METABOLIC PANEL
ANION GAP: 13 (ref 5–15)
BUN: 14 mg/dL (ref 6–20)
CO2: 29 mmol/L (ref 22–32)
CREATININE: 0.81 mg/dL (ref 0.61–1.24)
Calcium: 8.6 mg/dL — ABNORMAL LOW (ref 8.9–10.3)
Chloride: 98 mmol/L — ABNORMAL LOW (ref 101–111)
GFR calc non Af Amer: >60 mL/min (ref >60)
Glucose, Bld: 139 mg/dL — ABNORMAL HIGH (ref 65–99)
Potassium: 2.8 mmol/L — ABNORMAL LOW (ref 3.5–5.1)
Sodium: 140 mmol/L (ref 135–145)

## 2015-02-22 MED ORDER — POTASSIUM CHLORIDE ER 10 MEQ PO TBCR: 20.0000 meq | EXTENDED_RELEASE_TABLET | Freq: Two times a day (BID) | ORAL | Status: DC

## 2015-02-22 MED ORDER — POTASSIUM CHLORIDE 20 MEQ/15ML (10%) PO SOLN
60.0000 meq | Freq: Once | ORAL | Status: AC
  Administered 2015-02-22: 60 meq via ORAL
  Filled 2015-02-22: qty 45

## 2015-02-22 NOTE — ED Notes (Addendum)
Patient was walking with the use of cane. Patient states he is unsure if he passed out or not. Patient did say that he has been "passing out." lately. Patient states that he last took Hydrocone 10/325 mg and 2 BC powders. Patient has swelling to the left knee and hit his head on the computer stand.

## 2015-02-22 NOTE — Discharge Instructions (Signed)
Hypokalemia Hypokalemia means that the amount of potassium in the blood is lower than normal.Potassium is a chemical, called an electrolyte, that helps regulate the amount of fluid in the body. It also stimulates muscle contraction and helps nerves function properly.Most of the body's potassium is inside of cells, and only a very small amount is in the blood. Because the amount in the blood is so small, minor changes can be life-threatening. CAUSES  Antibiotics.  Diarrhea or vomiting.  Using laxatives too much, which can cause diarrhea.  Chronic kidney disease.  Water pills (diuretics).  Eating disorders (bulimia).  Low magnesium level.  Sweating a lot. SIGNS AND SYMPTOMS  Weakness.  Constipation.  Fatigue.  Muscle cramps.  Mental confusion.  Skipped heartbeats or irregular heartbeat (palpitations).  Tingling or numbness. DIAGNOSIS  Your health care provider can diagnose hypokalemia with blood tests. In addition to checking your potassium level, your health care provider may also check other lab tests. TREATMENT Hypokalemia can be treated with potassium supplements taken by mouth or adjustments in your current medicines. If your potassium level is very low, you may need to get potassium through a vein (IV) and be monitored in the hospital. A diet high in potassium is also helpful. Foods high in potassium are:  Nuts, such as peanuts and pistachios.  Seeds, such as sunflower seeds and pumpkin seeds.  Peas, lentils, and lima beans.  Whole grain and bran cereals and breads.  Fresh fruit and vegetables, such as apricots, avocado, bananas, cantaloupe, kiwi, oranges, tomatoes, asparagus, and potatoes.  Orange and tomato juices.  Red meats.  Fruit yogurt. HOME CARE INSTRUCTIONS  Take all medicines as prescribed by your health care provider.  Maintain a healthy diet by including nutritious food, such as fruits, vegetables, nuts, whole grains, and lean meats.  If  you are taking a laxative, be sure to follow the directions on the label. SEEK MEDICAL CARE IF:  Your weakness gets worse.  You feel your heart pounding or racing.  You are vomiting or having diarrhea.  You are diabetic and having trouble keeping your blood glucose in the normal range. SEEK IMMEDIATE MEDICAL CARE IF:  You have chest pain, shortness of breath, or dizziness.  You are vomiting or having diarrhea for more than 2 days.  You faint. MAKE SURE YOU:   Understand these instructions.  Will watch your condition.  Will get help right away if you are not doing well or get worse. Document Released: 09/17/2005 Document Revised: 07/08/2013 Document Reviewed: 03/20/2013 [facility] Patient Information 2015 [place], [place]. This information is not intended to replace advice given to you by your health care provider. Make sure you discuss any questions you have with your health care provider.  Fall Prevention and Home Safety Falls cause injuries and can affect all age groups. It is possible to use preventive measures to significantly decrease the likelihood of falls. There are many simple measures which can make your home safer and prevent falls. OUTDOORS  Repair cracks and edges of walkways and driveways.  Remove high doorway thresholds.  Trim shrubbery on the main path into your home.  Have good outside lighting.  Clear walkways of tools, rocks, debris, and clutter.  Check that handrails are not broken and are securely fastened. Both sides of steps should have handrails.  Have leaves, snow, and ice cleared regularly.  Use sand or salt on walkways during winter months.  In the garage, clean up grease or oil spills. BATHROOM  Install night lights.  Install grab bars by the toilet and in the tub and shower.  Use non-skid mats or decals in the tub or shower.  Place a plastic non-slip stool in the shower to sit on, if needed.  Keep floors dry and clean up all water on  the floor immediately.  Remove soap buildup in the tub or shower on a regular basis.  Secure bath mats with non-slip, double-sided rug tape.  Remove throw rugs and tripping hazards from the floors. BEDROOMS  Install night lights.  Make sure a bedside light is easy to reach.  Do not use oversized bedding.  Keep a telephone by your bedside.  Have a firm chair with side arms to use for getting dressed.  Remove throw rugs and tripping hazards from the floor. KITCHEN  Keep handles on pots and pans turned toward the center of the stove. Use back burners when possible.  Clean up spills quickly and allow time for drying.  Avoid walking on wet floors.  Avoid hot utensils and knives.  Position shelves so they are not too high or low.  Place commonly used objects within easy reach.  If necessary, use a sturdy step stool with a grab bar when reaching.  Keep electrical cables out of the way.  Do not use floor polish or wax that makes floors slippery. If you must use wax, use non-skid floor wax.  Remove throw rugs and tripping hazards from the floor. STAIRWAYS  Never leave objects on stairs.  Place handrails on both sides of stairways and use them. Fix any loose handrails. Make sure handrails on both sides of the stairways are as long as the stairs.  Check carpeting to make sure it is firmly attached along stairs. Make repairs to worn or loose carpet promptly.  Avoid placing throw rugs at the top or bottom of stairways, or properly secure the rug with carpet tape to prevent slippage. Get rid of throw rugs, if possible.  Have an electrician put in a light switch at the top and bottom of the stairs. OTHER FALL PREVENTION TIPS  Wear low-heel or rubber-soled shoes that are supportive and fit well. Wear closed toe shoes.  When using a stepladder, make sure it is fully opened and both spreaders are firmly locked. Do not climb a closed stepladder.  Add color or contrast paint or  tape to grab bars and handrails in your home. Place contrasting color strips on first and last steps.  Learn and use mobility aids as needed. Install an electrical emergency response system.  Turn on lights to avoid dark areas. Replace light bulbs that burn out immediately. Get light switches that glow.  Arrange furniture to create clear pathways. Keep furniture in the same place.  Firmly attach carpet with non-skid or double-sided tape.  Eliminate uneven floor surfaces.  Select a carpet pattern that does not visually hide the edge of steps.  Be aware of all pets. OTHER HOME SAFETY TIPS  Set the water temperature for 120 F (48.8 C).  Keep emergency numbers on or near the telephone.  Keep smoke detectors on every level of the home and near sleeping areas. Document Released: 09/07/2002 Document Revised: 03/18/2012 Document Reviewed: 12/07/2011 [facility] Patient Information 2015 [place], [place]. This information is not intended to replace advice given to you by your health care provider. Make sure you discuss any questions you have with your health care provider.  Chronic Pain Chronic pain can be defined as pain that is off and on and lasts  for 3-6 months or longer. Many things cause chronic pain, which can make it difficult to make a diagnosis. There are many treatment options available for chronic pain. However, finding a treatment that works well for you may require trying various approaches until the right one is found. Many people benefit from a combination of two or more types of treatment to control their pain. SYMPTOMS  Chronic pain can occur anywhere in the body and can range from mild to very severe. Some types of chronic pain include:  Headache.  Low back pain.  Cancer pain.  Arthritis pain.  Neurogenic pain. This is pain resulting from damage to nerves. People with chronic pain may also have other symptoms such  as:  Depression.  Anger.  Insomnia.  Anxiety. DIAGNOSIS  Your health care provider will help diagnose your condition over time. In many cases, the initial focus will be on excluding possible conditions that could be causing the pain. Depending on your symptoms, your health care provider may order tests to diagnose your condition. Some of these tests may include:   Blood tests.   CT scan.   MRI.   X-rays.   Ultrasounds.   Nerve conduction studies.  You may need to see a specialist.  TREATMENT  Finding treatment that works well may take time. You may be referred to a pain specialist. He or she may prescribe medicine or therapies, such as:   Mindful meditation or yoga.  Shots (injections) of numbing or pain-relieving medicines into the spine or area of pain.  Local electrical stimulation.  Acupuncture.   Massage therapy.   Aroma, color, light, or sound therapy.   Biofeedback.   Working with a physical therapist to keep from getting stiff.   Regular, gentle exercise.   Cognitive or behavioral therapy.   Group support.  Sometimes, surgery may be recommended.  HOME CARE INSTRUCTIONS   Take all medicines as directed by your health care provider.   Lessen stress in your life by relaxing and doing things such as listening to calming music.   Exercise or be active as directed by your health care provider.   Eat a healthy diet and include things such as vegetables, fruits, fish, and lean meats in your diet.   Keep all follow-up appointments with your health care provider.   Attend a support group with others suffering from chronic pain. SEEK MEDICAL CARE IF:   Your pain gets worse.   You develop a new pain that was not there before.   You cannot tolerate medicines given to you by your health care provider.   You have new symptoms since your last visit with your health care provider.  SEEK IMMEDIATE MEDICAL CARE IF:   You feel weak.    You have decreased sensation or numbness.   You lose control of bowel or bladder function.   Your pain suddenly gets much worse.   You develop shaking.  You develop chills.  You develop confusion.  You develop chest pain.  You develop shortness of breath.  MAKE SURE YOU:  Understand these instructions.  Will watch your condition.  Will get help right away if you are not doing well or get worse. Document Released: 06/09/2002 Document Revised: 05/20/2013 Document Reviewed: 03/13/2013 [facility] Patient Information 2015 [place], [place]. This information is not intended to replace advice given to you by your health care provider. Make sure you discuss any questions you have with your health care provider.
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
## 2015-02-25 ENCOUNTER — Emergency Department (HOSPITAL_BASED_OUTPATIENT_CLINIC_OR_DEPARTMENT_OTHER)
Admission: EM | Admit: 2015-02-25 | Discharge: 2015-02-26 | Disposition: A | Discharge status: Home / Self Care | Payer: Medicaid Other | Attending: Emergency Medicine | Admitting: Emergency Medicine

## 2015-02-25 ENCOUNTER — Emergency Department (HOSPITAL_BASED_OUTPATIENT_CLINIC_OR_DEPARTMENT_OTHER): Payer: Medicaid Other

## 2015-02-25 ENCOUNTER — Encounter (HOSPITAL_BASED_OUTPATIENT_CLINIC_OR_DEPARTMENT_OTHER): Payer: Self-pay

## 2015-02-25 DIAGNOSIS — Z86718 Personal history of other venous thrombosis and embolism: Secondary | ICD-10-CM | POA: Diagnosis not present

## 2015-02-25 DIAGNOSIS — R109 Unspecified abdominal pain: Secondary | ICD-10-CM | POA: Insufficient documentation

## 2015-02-25 DIAGNOSIS — W57XXXA Bitten or stung by nonvenomous insect and other nonvenomous arthropods, initial encounter: Secondary | ICD-10-CM

## 2015-02-25 DIAGNOSIS — S8002XA Contusion of left knee, initial encounter: Secondary | ICD-10-CM | POA: Insufficient documentation

## 2015-02-25 DIAGNOSIS — I10 Essential (primary) hypertension: Secondary | ICD-10-CM | POA: Insufficient documentation

## 2015-02-25 DIAGNOSIS — E039 Hypothyroidism, unspecified: Secondary | ICD-10-CM | POA: Diagnosis not present

## 2015-02-25 DIAGNOSIS — J449 Chronic obstructive pulmonary disease, unspecified: Secondary | ICD-10-CM | POA: Diagnosis not present

## 2015-02-25 DIAGNOSIS — Z86711 Personal history of pulmonary embolism: Secondary | ICD-10-CM | POA: Insufficient documentation

## 2015-02-25 DIAGNOSIS — S00432A Contusion of left ear, initial encounter: Secondary | ICD-10-CM | POA: Diagnosis not present

## 2015-02-25 DIAGNOSIS — G43909 Migraine, unspecified, not intractable, without status migrainosus: Secondary | ICD-10-CM | POA: Insufficient documentation

## 2015-02-25 DIAGNOSIS — W108XXA Fall (on) (from) other stairs and steps, initial encounter: Secondary | ICD-10-CM | POA: Diagnosis not present

## 2015-02-25 DIAGNOSIS — Z8701 Personal history of pneumonia (recurrent): Secondary | ICD-10-CM | POA: Diagnosis not present

## 2015-02-25 DIAGNOSIS — M1990 Unspecified osteoarthritis, unspecified site: Secondary | ICD-10-CM | POA: Diagnosis not present

## 2015-02-25 DIAGNOSIS — Z8505 Personal history of malignant neoplasm of liver: Secondary | ICD-10-CM | POA: Insufficient documentation

## 2015-02-25 DIAGNOSIS — Z85118 Personal history of other malignant neoplasm of bronchus and lung: Secondary | ICD-10-CM | POA: Diagnosis not present

## 2015-02-25 DIAGNOSIS — S92514A Nondisplaced fracture of proximal phalanx of right lesser toe(s), initial encounter for closed fracture: Secondary | ICD-10-CM | POA: Diagnosis not present

## 2015-02-25 DIAGNOSIS — I252 Old myocardial infarction: Secondary | ICD-10-CM | POA: Diagnosis not present

## 2015-02-25 DIAGNOSIS — E538 Deficiency of other specified B group vitamins: Secondary | ICD-10-CM | POA: Insufficient documentation

## 2015-02-25 DIAGNOSIS — S0991XA Unspecified injury of ear, initial encounter: Secondary | ICD-10-CM | POA: Diagnosis present

## 2015-02-25 DIAGNOSIS — Z7952 Long term (current) use of systemic steroids: Secondary | ICD-10-CM | POA: Diagnosis not present

## 2015-02-25 DIAGNOSIS — Z720 Tobacco use: Secondary | ICD-10-CM | POA: Insufficient documentation

## 2015-02-25 DIAGNOSIS — F329 Major depressive disorder, single episode, unspecified: Secondary | ICD-10-CM | POA: Diagnosis not present

## 2015-02-25 DIAGNOSIS — Y9389 Activity, other specified: Secondary | ICD-10-CM | POA: Insufficient documentation

## 2015-02-25 DIAGNOSIS — W19XXXD Unspecified fall, subsequent encounter: Secondary | ICD-10-CM

## 2015-02-25 DIAGNOSIS — Z79899 Other long term (current) drug therapy: Secondary | ICD-10-CM | POA: Insufficient documentation

## 2015-02-25 DIAGNOSIS — S8001XA Contusion of right knee, initial encounter: Secondary | ICD-10-CM | POA: Diagnosis not present

## 2015-02-25 DIAGNOSIS — S300XXA Contusion of lower back and pelvis, initial encounter: Secondary | ICD-10-CM | POA: Insufficient documentation

## 2015-02-25 DIAGNOSIS — Z880 Allergy status to penicillin: Secondary | ICD-10-CM | POA: Insufficient documentation

## 2015-02-25 DIAGNOSIS — G8929 Other chronic pain: Secondary | ICD-10-CM | POA: Insufficient documentation

## 2015-02-25 DIAGNOSIS — K219 Gastro-esophageal reflux disease without esophagitis: Secondary | ICD-10-CM | POA: Diagnosis not present

## 2015-02-25 DIAGNOSIS — Z8619 Personal history of other infectious and parasitic diseases: Secondary | ICD-10-CM | POA: Insufficient documentation

## 2015-02-25 DIAGNOSIS — Z9981 Dependence on supplemental oxygen: Secondary | ICD-10-CM | POA: Insufficient documentation

## 2015-02-25 DIAGNOSIS — Z9889 Other specified postprocedural states: Secondary | ICD-10-CM | POA: Diagnosis not present

## 2015-02-25 DIAGNOSIS — F419 Anxiety disorder, unspecified: Secondary | ICD-10-CM | POA: Diagnosis not present

## 2015-02-25 DIAGNOSIS — Y9289 Other specified places as the place of occurrence of the external cause: Secondary | ICD-10-CM | POA: Insufficient documentation

## 2015-02-25 DIAGNOSIS — Y998 Other external cause status: Secondary | ICD-10-CM | POA: Insufficient documentation

## 2015-02-25 DIAGNOSIS — T148XXA Other injury of unspecified body region, initial encounter: Secondary | ICD-10-CM

## 2015-02-25 DIAGNOSIS — S92501A Displaced unspecified fracture of right lesser toe(s), initial encounter for closed fracture: Secondary | ICD-10-CM

## 2015-02-25 MED ORDER — DOXYCYCLINE HYCLATE 100 MG PO CAPS: 100.0000 mg | ORAL_CAPSULE | Freq: Two times a day (BID) | ORAL | Status: DC

## 2015-02-25 MED ORDER — HYDROMORPHONE HCL 1 MG/ML IJ SOLN
2.0000 mg | Freq: Once | INTRAMUSCULAR | Status: AC
  Administered 2015-02-25: 2 mg via INTRAMUSCULAR
  Filled 2015-02-25: qty 2

## 2015-02-25 NOTE — ED Notes (Signed)
Patient transported to X-ray 

## 2015-02-25 NOTE — ED Provider Notes (Addendum)
CSN: [id number]     Arrival date & time 02/25/15  2205 History  This chart was scribed for Blanchie Dessert, MD by Irene Pap, ED Scribe. This patient was seen in room MH01/MH01 and patient care was started at 10:35 PM.    Chief Complaint  Patient presents with  . Fall   The history is provided by the patient. No language interpreter was used.   HPI Comments: Robert [name] is a 54 y.o. male with a history of DVT, HTN, stroke, lung and prostate cancer, COPD, and MI who presents to the Emergency Department complaining of a fall onset 3 days ago. His wife states that he fell down 18 steps and injured his knees and hit his ear. He states that it feels like "someone took a baseball bat to my knees." He reports associated back pain and bruising to majority of his back. He reports diaphoresis and chills. He reports associated sleep disturbance; he states that he has been unable to sleep due to pain. He states that he has pain all over and was unable to get himself out of the bathtub today. He states that he was seen at [facility] but did not receive any x-rays. He states that he was told that his potassium was low at that visit. He states that he was on hospice for 10 months for kidney failure but recently quit because of the amount of medication he was taking. He states that he has had frequent falls and passes out often but has not had syncope or a fall since being seen 2 days ago. He states that he has recently quit using his Fentanyl patches but uses 160 mg of Cymbalta everyday. He states that he wants to give hospice another shot. His wife states that his activity level has changed drastically, that he is "not the same person."  He reports that he went fishing 2 weeks ago and was unable to stand, so he fell into the water and crawled out, laying in the grass. He states that he then had a lot of tick bites, then began to experience the diaphoresis.  Past Medical History  Diagnosis Date  . Deep  vein thrombosis     "several"  . Pulmonary emboli     "several"  . COPD (chronic obstructive pulmonary disease)   . Partial small bowel obstruction   . Myocardial infarction 2007; ~ 2011  . Chronic liver failure   . Shortness of breath     "all the time"  . Oxygen dependent     3L; 24/7" (05/27/2014)  . History of blood transfusion 2013    "related to kidneys shutting down"  . Hepatitis C   . Depression   . Hypertension   . Hypothyroidism   . Asthma   . Pneumonia     "several times"  . GERD (gastroesophageal reflux disease)   . Migraines     "2-3/day" (05/27/2014)  . Stroke ~ 03/2012    "couldn't use my left hand for ~ 6 months" (05/27/2014)  . Stroke 05/25/2014    "not able to use my left hand again" (05/27/2014)  . Arthritis     "I'm eat up w/it"  . Chronic lower back pain   . Anxiety   . Intermittent self-catheterization of bladder   . Liver cancer   . Metastatic lung cancer     "left"  . B12 deficiency     "give myself shots"   Past Surgical History  Procedure Laterality Date  .  Transurethral resection of prostate  2012; 2014  . Tonsillectomy and adenoidectomy  1974  . Knee surgery Left ~ 1972    "cut top of my kneecap off"  . Multiple tooth extractions  11/2010    "26"  . Incision and drainage abscess Left 05/28/2014    Procedure: INCISION AND DRAINAGE ABSCESS LEFT WRIST;  Surgeon: Leanora Cover, MD;  Location: [facility];  Service: Orthopedics;  Laterality: Left;   Family History  Problem Relation Age of Onset  . Cancer Mother   . Cancer Paternal Aunt   . Cancer Maternal Aunt    History  Substance Use Topics  . Smoking status: Current Every Day Smoker -- 0.50 packs/day for 41 years    Types: Cigarettes  . Smokeless tobacco: Former Systems developer  . Alcohol Use: Yes    Review of Systems A complete 10 system review of systems was obtained and all systems are negative except as noted in the HPI and PMH.   Allergies  Penicillins and Morphine and related  Home  Medications   Prior to Admission medications   Medication Sig Start Date End Date Taking? Authorizing Provider  albuterol (PROVENTIL HFA;VENTOLIN HFA) 108 (90 BASE) MCG/ACT inhaler Inhale 1-2 puffs into the lungs every 6 (six) hours as needed for wheezing or shortness of breath. 09/20/14   Quintella Reichert, MD  Aspirin-Salicylamide-Caffeine [facility] HEADACHE POWDER PO) Take 1 packet by mouth daily as needed (pain).    Historical Provider, MD  atenolol-chlorthalidone (TENORETIC) 50-25 MG per tablet Take 1 tablet by mouth daily.    Historical Provider, MD  azithromycin (ZITHROMAX Z-PAK) 250 MG tablet Take two tablets by mouth on day one and one tablet by mouth daily on days 2-5 09/20/14   Quintella Reichert, MD  butalbital-acetaminophen-caffeine (ESGIC PLUS) 50-500-40 MG per tablet Take 1 tablet by mouth every 4 (four) hours as needed (migraines).     Historical Provider, MD  cyanocobalamin (,VITAMIN B-12,) 1000 MCG/ML injection Inject 1,000 mcg into the muscle once a week. On monday    Historical Provider, MD  dextromethorphan-guaiFENesin [facility] DM) 30-600 MG per 12 hr tablet Take 1 tablet by mouth 2 (two) times daily as needed for cough (cough).    Historical Provider, MD  diazepam (VALIUM) 10 MG tablet Take 1 tablet (10 mg total) by mouth daily. 06/07/14   Veryl Speak, MD  DULoxetine (CYMBALTA) 60 MG capsule Take 1 capsule (60 mg total) by mouth 2 (two) times daily. 06/07/14   Veryl Speak, MD  gabapentin (NEURONTIN) 400 MG capsule Take 1 capsule (400 mg total) by mouth 3 (three) times daily. Patient taking differently: Take 2,000 mg by mouth 3 (three) times daily.  06/07/14   Veryl Speak, MD  Ipratropium-Albuterol (COMBIVENT RESPIMAT) 20-100 MCG/ACT AERS respimat Inhale 1 puff into the lungs daily.    Historical Provider, MD  levothyroxine (SYNTHROID, LEVOTHROID) 50 MCG tablet Take 1 tablet (50 mcg total) by mouth daily before breakfast. 06/07/14   Veryl Speak, MD  lidocaine (LIDODERM) 5 % Place 1 patch onto  the skin daily. Remove & Discard patch within 12 hours or as directed by MD Patient not taking: Reported on 02/22/2015 10/13/14   Orpah Greek, MD  Multiple Vitamins-Minerals (MULTIVITAMIN WITH MINERALS) tablet Take 1 tablet by mouth daily.    Historical Provider, MD  naproxen sodium (ANAPROX) 220 MG tablet Take 660 mg by mouth daily as needed (pain).    Historical Provider, MD  omeprazole (PRILOSEC) 20 MG capsule Take 1 capsule (20 mg total) by  mouth 2 (two) times daily. Patient taking differently: Take 20 mg by mouth daily as needed (heartburn).  06/07/14   Veryl Speak, MD  Oxycodone HCl 10 MG TABS Take 1-2 tablets (10-20 mg total) by mouth every 6 (six) hours as needed. 08/21/14   Blanchie Dessert, MD  oxyCODONE-acetaminophen (PERCOCET/ROXICET) 5-325 MG per tablet Take 1 tablet by mouth every 6 (six) hours as needed for moderate pain or severe pain. 09/20/14   Quintella Reichert, MD  Potassium 99 MG TABS Take 1 tablet by mouth daily.    Historical Provider, MD  potassium chloride (K-DUR) 10 MEQ tablet Take 2 tablets (20 mEq total) by mouth 2 (two) times daily. 02/22/15   Charlesetta Shanks, MD  potassium chloride SA (K-DUR,KLOR-CON) 20 MEQ tablet Take 1 tablet (20 mEq total) by mouth at bedtime. Patient not taking: Reported on 02/22/2015 06/07/14   Veryl Speak, MD  predniSONE (DELTASONE) 10 MG tablet Take 4 tablets (40 mg total) by mouth daily. Patient taking differently: Take 10 mg by mouth 2 (two) times daily with a meal.  09/20/14   Quintella Reichert, MD  promethazine (PHENERGAN) 25 MG tablet Take 1 tablet (25 mg total) by mouth 2 (two) times daily. Patient taking differently: Take 25 mg by mouth 2 (two) times daily as needed for nausea or vomiting (nausea and vomiting).  06/07/14   Veryl Speak, MD  Rivaroxaban (XARELTO) 15 MG TABS tablet Take 15 mg by mouth daily.     Historical Provider, MD  tamsulosin (FLOMAX) 0.4 MG CAPS capsule Take 1 capsule (0.4 mg total) by mouth at bedtime. Patient taking  differently: Take 0.8 mg by mouth daily.  06/07/14   Veryl Speak, MD   BP 127/80 mmHg  Pulse 86  Temp(Src) 97.9 F (36.6 C) (Oral)  Resp 16  Ht '5\' 7"'$  (1.702 m)  Wt 160 lb (72.576 kg)  BMI 25.05 kg/m2  SpO2 100%   Physical Exam  Constitutional: He is oriented to person, place, and time. He appears well-developed and well-nourished.  Patient is awake and alert but smells of alcohol  HENT:  Head: Normocephalic and atraumatic.  Ecchymosis noted over the pinna of the left ear without hematoma present.  Eyes: EOM are normal.  Neck: Normal range of motion. Neck supple.  Cardiovascular: Normal rate, regular rhythm and normal heart sounds.   No murmur heard. Pulmonary/Chest: Effort normal and breath sounds normal. He has no wheezes.  Abdominal: Soft. There is tenderness. There is no rebound and no guarding.  No notable ecchymosis  Musculoskeletal: Normal range of motion. He exhibits tenderness.  Diffuse ecchymosis of various stages, complete range of knees w/o difficulty, healing ecchymosis to right 5th metatarsal; lumbar tenderness without obvious bruising.   Neurological: He is alert and oriented to person, place, and time.  Skin: Skin is warm and dry.  Psychiatric: He has a normal mood and affect. His behavior is normal.  Nursing note and vitals reviewed.   ED Course  Procedures (including critical care time) DIAGNOSTIC STUDIES: Oxygen Saturation is 100% on room air, normal by my interpretation.    COORDINATION OF CARE: 10:44 PM-Discussed treatment plan which includes x-ray and a shot of Dilaudid prior to discharge with pt at bedside and pt agreed to plan.   Labs Review Labs Reviewed - No data to display  Imaging Review Dg Lumbar Spine Complete  02/25/2015   CLINICAL DATA:  Status post fall down 18 steps, with lower back pain. Initial encounter.  EXAM: LUMBAR SPINE - COMPLETE 4+ VIEW  COMPARISON:  CT of the abdomen and pelvis performed 04/18/2012  FINDINGS: There is no evidence  of fracture or subluxation. Vertebral bodies demonstrate normal height and alignment. Intervertebral disc spaces are preserved. The visualized neural foramina are grossly unremarkable in appearance.  The visualized bowel gas pattern is unremarkable in appearance; air and stool are noted within the colon. The sacroiliac joints are within normal limits.  IMPRESSION: No evidence of fracture or subluxation along the lumbar spine.   Electronically Signed   By: Garald Balding M.D.   On: 02/25/2015 23:55   Dg Knee Complete 4 Views Left  02/25/2015   CLINICAL DATA:  Status post fall 3 days ago, with injury to left knee. Left knee pain. Initial encounter.  EXAM: LEFT KNEE - COMPLETE 4+ VIEW  COMPARISON:  None.  FINDINGS: There is no evidence of fracture or dislocation. The joint spaces are preserved. No significant degenerative change is seen; the patellofemoral joint is grossly unremarkable in appearance. A fabella is noted.  No significant joint effusion is seen. The visualized soft tissues are normal in appearance.  IMPRESSION: No evidence of fracture or dislocation.   Electronically Signed   By: Garald Balding M.D.   On: 02/25/2015 23:53   Dg Knee Complete 4 Views Right  02/25/2015   CLINICAL DATA:  Status post fall, with right knee pain. Initial encounter.  EXAM: RIGHT KNEE - COMPLETE 4+ VIEW  COMPARISON:  Right femur radiographs performed 04/17/2012  FINDINGS: There is no evidence of fracture or dislocation. The joint spaces are preserved. No significant degenerative change is seen; the patellofemoral joint is grossly unremarkable in appearance. A fabella is noted.  No significant joint effusion is seen. The visualized soft tissues are normal in appearance.  IMPRESSION: No evidence of fracture or dislocation.   Electronically Signed   By: Garald Balding M.D.   On: 02/25/2015 23:54   Dg Foot Complete Right  02/25/2015   CLINICAL DATA:  Fall.  EXAM: RIGHT FOOT COMPLETE - 3+ VIEW  COMPARISON:  None.  FINDINGS:  Distorted and collapsed appearance of the calcaneus body with chronic appearing margins.  Branching nondisplaced fracture through the fifth proximal phalanx.  No dislocation.  IMPRESSION: 1. Nondisplaced fifth proximal phalanx fracture, extra-articular. 2. Remote appearing calcaneus fracture with collapse.   Electronically Signed   By: Monte Fantasia M.D.   On: 02/25/2015 23:55     EKG Interpretation None      MDM   Final diagnoses:  Fall, subsequent encounter  Tick bite  Contusion  Fracture of fifth toe, right, closed, initial encounter   Pt with mmp including DVT on xarelto, MI, CA and chronic pain who was on hospice treatment presents today for worsening pain after a fall 2 days ago where he presented to [facility] and had neg CT of the head and normal CXR.  He states that he falls all the time both mechanically and syncope.  However denies any other events since his fall.  He has ongoing bilateral knee pain and was concerned that knees and back were not x-rayed.  He is requesting imaging of knees and back today to ensure nothing is broken.  Requesting pain shot however pt filled 90 oxy '10mg'$  tabs on 02/17/15 and discussed with pt and his wife that he would not be receiving a prescription today.   Will get x-ray of bilateral knees and right foot due to evidence of injury.  Also of the lumbar spine. Imaging neg except for fifth proximal phalanx  fx.  Placed in post op shoe  Pt found to have K of 2.8 2 days ago and restarted his potassium.  Pt does admit to drinking but does not appear intoxicated at this time.   Given pt's story of multiple tick bites 2 weeks ago with sweats and chills will cover with doxy for RMSF.  However no rashes at this time and no hx of fever, headache or neck pain.   I personally performed the services described in this documentation, which was scribed in my presence.  The recorded information has been reviewed and considered.     Blanchie Dessert, MD 02/25/15  2346  Blanchie Dessert, MD 02/26/15 0002

## 2015-02-25 NOTE — ED Notes (Signed)
MD at bedside. 

## 2015-02-25 NOTE — ED Notes (Signed)
C/o fall on 5/24 with cont'd pain "all over"-pt with slow movement and slurred speech-admits to ETOH PTA

## 2015-02-25 NOTE — Progress Notes (Signed)
Placed patient of 3 lpm nasal cannula per his home regimen.

## 2015-02-25 NOTE — ED Notes (Signed)
Returned from xr

## 2015-02-26 MED ORDER — PROMETHAZINE HCL 25 MG PO TABS: 25.0000 mg | ORAL_TABLET | Freq: Once | ORAL | Status: DC

## 2015-02-26 MED ORDER — PROMETHAZINE HCL 25 MG PO TABS
25.0000 mg | ORAL_TABLET | Freq: Once | ORAL | Status: AC
  Administered 2015-02-26: 25 mg via ORAL
  Filled 2015-02-26: qty 1

## 2015-03-07 ENCOUNTER — Emergency Department (HOSPITAL_BASED_OUTPATIENT_CLINIC_OR_DEPARTMENT_OTHER)
Admission: EM | Admit: 2015-03-07 | Discharge: 2015-03-07 | Discharge status: Against Medical Advice | Payer: Medicaid Other | Attending: Emergency Medicine | Admitting: Emergency Medicine

## 2015-03-07 ENCOUNTER — Encounter (HOSPITAL_BASED_OUTPATIENT_CLINIC_OR_DEPARTMENT_OTHER): Payer: Self-pay | Admitting: *Deleted

## 2015-03-07 DIAGNOSIS — Y998 Other external cause status: Secondary | ICD-10-CM | POA: Diagnosis not present

## 2015-03-07 DIAGNOSIS — G8929 Other chronic pain: Secondary | ICD-10-CM | POA: Diagnosis not present

## 2015-03-07 DIAGNOSIS — W1839XA Other fall on same level, initial encounter: Secondary | ICD-10-CM | POA: Insufficient documentation

## 2015-03-07 DIAGNOSIS — Y9389 Activity, other specified: Secondary | ICD-10-CM | POA: Insufficient documentation

## 2015-03-07 DIAGNOSIS — T148 Other injury of unspecified body region: Secondary | ICD-10-CM | POA: Insufficient documentation

## 2015-03-07 DIAGNOSIS — I10 Essential (primary) hypertension: Secondary | ICD-10-CM | POA: Insufficient documentation

## 2015-03-07 DIAGNOSIS — Z720 Tobacco use: Secondary | ICD-10-CM | POA: Insufficient documentation

## 2015-03-07 DIAGNOSIS — Y9289 Other specified places as the place of occurrence of the external cause: Secondary | ICD-10-CM | POA: Diagnosis not present

## 2015-03-07 DIAGNOSIS — J449 Chronic obstructive pulmonary disease, unspecified: Secondary | ICD-10-CM | POA: Diagnosis not present

## 2015-03-07 DIAGNOSIS — I252 Old myocardial infarction: Secondary | ICD-10-CM | POA: Insufficient documentation

## 2015-03-07 NOTE — ED Notes (Signed)
Patient called x3 for treatment room, another waiting patient stated the patient left.

## 2015-03-07 NOTE — ED Notes (Signed)
States he fell. States he has to have a Rx for pain.

## 2015-03-23 ENCOUNTER — Other Ambulatory Visit: Payer: Self-pay

## 2015-03-23 ENCOUNTER — Ambulatory Visit: Payer: Self-pay | Admitting: Internal Medicine

## 2015-06-16 ENCOUNTER — Encounter (HOSPITAL_BASED_OUTPATIENT_CLINIC_OR_DEPARTMENT_OTHER): Payer: Self-pay

## 2015-06-16 ENCOUNTER — Emergency Department (HOSPITAL_BASED_OUTPATIENT_CLINIC_OR_DEPARTMENT_OTHER)
Admission: EM | Admit: 2015-06-16 | Discharge: 2015-06-16 | Disposition: A | Discharge status: Home / Self Care | Payer: Medicaid Other | Attending: Emergency Medicine | Admitting: Emergency Medicine

## 2015-06-16 ENCOUNTER — Emergency Department (HOSPITAL_BASED_OUTPATIENT_CLINIC_OR_DEPARTMENT_OTHER): Payer: Medicaid Other

## 2015-06-16 DIAGNOSIS — Z86718 Personal history of other venous thrombosis and embolism: Secondary | ICD-10-CM | POA: Diagnosis not present

## 2015-06-16 DIAGNOSIS — Y9389 Activity, other specified: Secondary | ICD-10-CM | POA: Diagnosis not present

## 2015-06-16 DIAGNOSIS — Z8619 Personal history of other infectious and parasitic diseases: Secondary | ICD-10-CM | POA: Insufficient documentation

## 2015-06-16 DIAGNOSIS — G43909 Migraine, unspecified, not intractable, without status migrainosus: Secondary | ICD-10-CM | POA: Diagnosis not present

## 2015-06-16 DIAGNOSIS — Z7952 Long term (current) use of systemic steroids: Secondary | ICD-10-CM | POA: Diagnosis not present

## 2015-06-16 DIAGNOSIS — S6991XA Unspecified injury of right wrist, hand and finger(s), initial encounter: Secondary | ICD-10-CM | POA: Diagnosis present

## 2015-06-16 DIAGNOSIS — J449 Chronic obstructive pulmonary disease, unspecified: Secondary | ICD-10-CM | POA: Diagnosis not present

## 2015-06-16 DIAGNOSIS — F419 Anxiety disorder, unspecified: Secondary | ICD-10-CM | POA: Insufficient documentation

## 2015-06-16 DIAGNOSIS — M1990 Unspecified osteoarthritis, unspecified site: Secondary | ICD-10-CM | POA: Diagnosis not present

## 2015-06-16 DIAGNOSIS — Y9289 Other specified places as the place of occurrence of the external cause: Secondary | ICD-10-CM | POA: Diagnosis not present

## 2015-06-16 DIAGNOSIS — Z880 Allergy status to penicillin: Secondary | ICD-10-CM | POA: Insufficient documentation

## 2015-06-16 DIAGNOSIS — W1839XA Other fall on same level, initial encounter: Secondary | ICD-10-CM | POA: Diagnosis not present

## 2015-06-16 DIAGNOSIS — Z86711 Personal history of pulmonary embolism: Secondary | ICD-10-CM | POA: Insufficient documentation

## 2015-06-16 DIAGNOSIS — Z720 Tobacco use: Secondary | ICD-10-CM | POA: Insufficient documentation

## 2015-06-16 DIAGNOSIS — Z8701 Personal history of pneumonia (recurrent): Secondary | ICD-10-CM | POA: Diagnosis not present

## 2015-06-16 DIAGNOSIS — F329 Major depressive disorder, single episode, unspecified: Secondary | ICD-10-CM | POA: Diagnosis not present

## 2015-06-16 DIAGNOSIS — Z9981 Dependence on supplemental oxygen: Secondary | ICD-10-CM | POA: Diagnosis not present

## 2015-06-16 DIAGNOSIS — K219 Gastro-esophageal reflux disease without esophagitis: Secondary | ICD-10-CM | POA: Diagnosis not present

## 2015-06-16 DIAGNOSIS — Y998 Other external cause status: Secondary | ICD-10-CM | POA: Insufficient documentation

## 2015-06-16 DIAGNOSIS — G8929 Other chronic pain: Secondary | ICD-10-CM | POA: Diagnosis not present

## 2015-06-16 DIAGNOSIS — E039 Hypothyroidism, unspecified: Secondary | ICD-10-CM | POA: Insufficient documentation

## 2015-06-16 DIAGNOSIS — S60221A Contusion of right hand, initial encounter: Secondary | ICD-10-CM | POA: Diagnosis not present

## 2015-06-16 DIAGNOSIS — Z79899 Other long term (current) drug therapy: Secondary | ICD-10-CM | POA: Diagnosis not present

## 2015-06-16 DIAGNOSIS — I252 Old myocardial infarction: Secondary | ICD-10-CM | POA: Insufficient documentation

## 2015-06-16 DIAGNOSIS — Z8505 Personal history of malignant neoplasm of liver: Secondary | ICD-10-CM | POA: Diagnosis not present

## 2015-06-16 DIAGNOSIS — I10 Essential (primary) hypertension: Secondary | ICD-10-CM | POA: Insufficient documentation

## 2015-06-16 DIAGNOSIS — Z8673 Personal history of transient ischemic attack (TIA), and cerebral infarction without residual deficits: Secondary | ICD-10-CM | POA: Diagnosis not present

## 2015-06-16 MED ORDER — TRAMADOL HCL 50 MG PO TABS
50.0000 mg | ORAL_TABLET | Freq: Once | ORAL | Status: AC
  Administered 2015-06-16: 50 mg via ORAL
  Filled 2015-06-16: qty 1

## 2015-06-16 NOTE — ED Notes (Signed)
During MD examination pt admitted to having several beers tonight prior to the fall and is unsure if he hit his head during fall.  Pt denies head or neck pain, no visible injury noted.

## 2015-06-16 NOTE — Discharge Instructions (Signed)
Cryotherapy °Cryotherapy means treatment with cold. Ice or gel packs can be used to reduce both pain and swelling. Ice is the most helpful within the first 24 to 48 hours after an injury or flare-up from overusing a muscle or joint. Sprains, strains, spasms, burning pain, shooting pain, and aches can all be eased with ice. Ice can also be used when recovering from surgery. Ice is effective, has very few side effects, and is safe for most people to use. °PRECAUTIONS  °Ice is not a safe treatment option for people with: °· Raynaud phenomenon. This is a condition affecting small blood vessels in the extremities. Exposure to cold may cause your problems to return. °· Cold hypersensitivity. There are many forms of cold hypersensitivity, including: °¨ Cold urticaria. Red, itchy hives appear on the skin when the tissues begin to warm after being iced. °¨ Cold erythema. This is a red, itchy rash caused by exposure to cold. °¨ Cold hemoglobinuria. Red blood cells break down when the tissues begin to warm after being iced. The hemoglobin that carry oxygen are passed into the urine because they cannot combine with blood proteins fast enough. °· Numbness or altered sensitivity in the area being iced. °If you have any of the following conditions, do not use ice until you have discussed cryotherapy with your caregiver: °· Heart conditions, such as arrhythmia, angina, or chronic heart disease. °· High blood pressure. °· Healing wounds or open skin in the area being iced. °· Current infections. °· Rheumatoid arthritis. °· Poor circulation. °· Diabetes. °Ice slows the blood flow in the region it is applied. This is beneficial when trying to stop inflamed tissues from spreading irritating chemicals to surrounding tissues. However, if you expose your skin to cold temperatures for too long or without the proper protection, you can damage your skin or nerves. Watch for signs of skin damage due to cold. °HOME CARE INSTRUCTIONS °Follow  these tips to use ice and cold packs safely. °· Place a dry or damp towel between the ice and skin. A damp towel will cool the skin more quickly, so you may need to shorten the time that the ice is used. °· For a more rapid response, add gentle compression to the ice. °· Ice for no more than 10 to 20 minutes at a time. The bonier the area you are icing, the less time it will take to get the benefits of ice. °· Check your skin after 5 minutes to make sure there are no signs of a poor response to cold or skin damage. °· Rest 20 minutes or more between uses. °· Once your skin is numb, you can end your treatment. You can test numbness by very lightly touching your skin. The touch should be so light that you do not see the skin dimple from the pressure of your fingertip. When using ice, most people will feel these normal sensations in this order: cold, burning, aching, and numbness. °· Do not use ice on someone who cannot communicate their responses to pain, such as small children or people with dementia. °HOW TO MAKE AN ICE PACK °Ice packs are the most common way to use ice therapy. Other methods include ice massage, ice baths, and cryosprays. Muscle creams that cause a cold, tingly feeling do not offer the same benefits that ice offers and should not be used as a substitute unless recommended by your caregiver. °To make an ice pack, do one of the following: °· Place crushed ice or a   bag of frozen vegetables in a sealable plastic bag. Squeeze out the excess air. Place this bag inside another plastic bag. Slide the bag into a pillowcase or place a damp towel between your skin and the bag. °· Mix 3 parts water with 1 part rubbing alcohol. Freeze the mixture in a sealable plastic bag. When you remove the mixture from the freezer, it will be slushy. Squeeze out the excess air. Place this bag inside another plastic bag. Slide the bag into a pillowcase or place a damp towel between your skin and the bag. °SEEK MEDICAL CARE  IF: °· You develop white spots on your skin. This may give the skin a blotchy (mottled) appearance. °· Your skin turns blue or pale. °· Your skin becomes waxy or hard. °· Your swelling gets worse. °MAKE SURE YOU:  °· Understand these instructions. °· Will watch your condition. °· Will get help right away if you are not doing well or get worse. °Document Released: 05/14/2011 Document Revised: 02/01/2014 Document Reviewed: 05/14/2011 °[employer]® Patient Information ©2015 [employer]. This information is not intended to replace advice given to you by your health care provider. Make sure you discuss any questions you have with your health care provider. ° °

## 2015-06-16 NOTE — ED Notes (Signed)
Pt verbalizes understanding of d/c instructions and denies any further needs at this time. 

## 2015-06-16 NOTE — ED Notes (Signed)
Pt ambulated to bathroom without issue

## 2015-06-16 NOTE — ED Notes (Signed)
Pt states he was going to the bathroom and "passed out" on the way to the bathroom and injured his hand.  It is difficult to understand pt as to why he is here as he has a lot of chronic problems and chronic pain.  His right hand appears to be the issue tonight as it is swollen.  Pt also c/o bilateral knee pain, although there is no deformity to either and there are old, healing abrasions to his knees.  Pt has a hx of syncopal episodes going on over a year.  He is also c/o hiccups and chronic body pain.

## 2015-06-16 NOTE — ED Provider Notes (Signed)
CSN: [id number]     Arrival date & time 06/16/15  0215 History   None    Chief Complaint  Patient presents with  . Fall     (Consider location/radiation/quality/duration/timing/severity/associated sxs/prior Treatment) Patient is a 54 y.o. male presenting with fall. The history is provided by the patient and the spouse.  Fall This is a new problem. The current episode started less than 1 hour ago. The problem occurs constantly. The problem has not changed since onset.Pertinent negatives include no chest pain, no abdominal pain, no headaches and no shortness of breath. Nothing aggravates the symptoms. Nothing relieves the symptoms. He has tried nothing for the symptoms. The treatment provided no relief.  Was drinking ETOH and stood up and fell.  Right hand is swollen on the dorsal aspect.    Past Medical History  Diagnosis Date  . Deep vein thrombosis     "several"  . Pulmonary emboli     "several"  . COPD (chronic obstructive pulmonary disease)   . Partial small bowel obstruction   . Myocardial infarction 2007; ~ 2011  . Chronic liver failure   . Shortness of breath     "all the time"  . Oxygen dependent     3L; 24/7" (05/27/2014)  . History of blood transfusion 2013    "related to kidneys shutting down"  . Hepatitis C   . Depression   . Hypertension   . Hypothyroidism   . Asthma   . Pneumonia     "several times"  . GERD (gastroesophageal reflux disease)   . Migraines     "2-3/day" (05/27/2014)  . Stroke ~ 03/2012    "couldn't use my left hand for ~ 6 months" (05/27/2014)  . Stroke 05/25/2014    "not able to use my left hand again" (05/27/2014)  . Arthritis     "I'm eat up w/it"  . Chronic lower back pain   . Anxiety   . Intermittent self-catheterization of bladder   . Liver cancer   . Metastatic lung cancer     "left"  . B12 deficiency     "give myself shots"   Past Surgical History  Procedure Laterality Date  . Transurethral resection of prostate  2012; 2014  .  Tonsillectomy and adenoidectomy  1974  . Knee surgery Left ~ 1972    "cut top of my kneecap off"  . Multiple tooth extractions  11/2010    "26"  . Incision and drainage abscess Left 05/28/2014    Procedure: INCISION AND DRAINAGE ABSCESS LEFT WRIST;  Surgeon: Leanora Cover, MD;  Location: [facility];  Service: Orthopedics;  Laterality: Left;   Family History  Problem Relation Age of Onset  . Cancer Mother   . Cancer Paternal Aunt   . Cancer Maternal Aunt    Social History  Substance Use Topics  . Smoking status: Current Every Day Smoker -- 0.50 packs/day for 41 years    Types: Cigarettes  . Smokeless tobacco: Former Systems developer  . Alcohol Use: Yes    Review of Systems  Constitutional: Negative for fever.  Eyes: Negative for photophobia.  Respiratory: Negative for chest tightness and shortness of breath.   Cardiovascular: Negative for chest pain, palpitations and leg swelling.  Gastrointestinal: Negative for abdominal pain.  Musculoskeletal: Negative for back pain, neck pain and neck stiffness.  Neurological: Negative for syncope, facial asymmetry, speech difficulty, weakness, numbness and headaches.  All other systems reviewed and are negative.     Allergies  Penicillins and Morphine  and related  Home Medications   Prior to Admission medications   Medication Sig Start Date End Date Taking? Authorizing Provider  albuterol (PROVENTIL HFA;VENTOLIN HFA) 108 (90 BASE) MCG/ACT inhaler Inhale 1-2 puffs into the lungs every 6 (six) hours as needed for wheezing or shortness of breath. 09/20/14   Quintella Reichert, MD  Aspirin-Salicylamide-Caffeine [facility] HEADACHE POWDER PO) Take 1 packet by mouth daily as needed (pain).    Historical Provider, MD  atenolol-chlorthalidone (TENORETIC) 50-25 MG per tablet Take 1 tablet by mouth daily.    Historical Provider, MD  azithromycin (ZITHROMAX Z-PAK) 250 MG tablet Take two tablets by mouth on day one and one tablet by mouth daily on days 2-5 09/20/14   Quintella Reichert, MD  butalbital-acetaminophen-caffeine (ESGIC PLUS) 50-500-40 MG per tablet Take 1 tablet by mouth every 4 (four) hours as needed (migraines).     Historical Provider, MD  cyanocobalamin (,VITAMIN B-12,) 1000 MCG/ML injection Inject 1,000 mcg into the muscle once a week. On monday    Historical Provider, MD  dextromethorphan-guaiFENesin [facility] DM) 30-600 MG per 12 hr tablet Take 1 tablet by mouth 2 (two) times daily as needed for cough (cough).    Historical Provider, MD  diazepam (VALIUM) 10 MG tablet Take 1 tablet (10 mg total) by mouth daily. 06/07/14   Veryl Speak, MD  doxycycline (VIBRAMYCIN) 100 MG capsule Take 1 capsule (100 mg total) by mouth 2 (two) times daily. 02/25/15   Blanchie Dessert, MD  DULoxetine (CYMBALTA) 60 MG capsule Take 1 capsule (60 mg total) by mouth 2 (two) times daily. 06/07/14   Veryl Speak, MD  gabapentin (NEURONTIN) 400 MG capsule Take 1 capsule (400 mg total) by mouth 3 (three) times daily. Patient taking differently: Take 2,000 mg by mouth 3 (three) times daily.  06/07/14   Veryl Speak, MD  Ipratropium-Albuterol (COMBIVENT RESPIMAT) 20-100 MCG/ACT AERS respimat Inhale 1 puff into the lungs daily.    Historical Provider, MD  levothyroxine (SYNTHROID, LEVOTHROID) 50 MCG tablet Take 1 tablet (50 mcg total) by mouth daily before breakfast. 06/07/14   Veryl Speak, MD  lidocaine (LIDODERM) 5 % Place 1 patch onto the skin daily. Remove & Discard patch within 12 hours or as directed by MD Patient not taking: Reported on 02/22/2015 10/13/14   Orpah Greek, MD  Multiple Vitamins-Minerals (MULTIVITAMIN WITH MINERALS) tablet Take 1 tablet by mouth daily.    Historical Provider, MD  naproxen sodium (ANAPROX) 220 MG tablet Take 660 mg by mouth daily as needed (pain).    Historical Provider, MD  omeprazole (PRILOSEC) 20 MG capsule Take 1 capsule (20 mg total) by mouth 2 (two) times daily. Patient taking differently: Take 20 mg by mouth daily as needed (heartburn).  06/07/14    Veryl Speak, MD  Oxycodone HCl 10 MG TABS Take 1-2 tablets (10-20 mg total) by mouth every 6 (six) hours as needed. 08/21/14   Blanchie Dessert, MD  oxyCODONE-acetaminophen (PERCOCET/ROXICET) 5-325 MG per tablet Take 1 tablet by mouth every 6 (six) hours as needed for moderate pain or severe pain. 09/20/14   Quintella Reichert, MD  Potassium 99 MG TABS Take 1 tablet by mouth daily.    Historical Provider, MD  potassium chloride (K-DUR) 10 MEQ tablet Take 2 tablets (20 mEq total) by mouth 2 (two) times daily. 02/22/15   Charlesetta Shanks, MD  potassium chloride SA (K-DUR,KLOR-CON) 20 MEQ tablet Take 1 tablet (20 mEq total) by mouth at bedtime. Patient not taking: Reported on 02/22/2015 06/07/14  Veryl Speak, MD  predniSONE (DELTASONE) 10 MG tablet Take 4 tablets (40 mg total) by mouth daily. Patient taking differently: Take 10 mg by mouth 2 (two) times daily with a meal.  09/20/14   Quintella Reichert, MD  promethazine (PHENERGAN) 25 MG tablet Take 1 tablet (25 mg total) by mouth 2 (two) times daily. Patient taking differently: Take 25 mg by mouth 2 (two) times daily as needed for nausea or vomiting (nausea and vomiting).  06/07/14   Veryl Speak, MD  Rivaroxaban (XARELTO) 15 MG TABS tablet Take 15 mg by mouth daily.     Historical Provider, MD  tamsulosin (FLOMAX) 0.4 MG CAPS capsule Take 1 capsule (0.4 mg total) by mouth at bedtime. Patient taking differently: Take 0.8 mg by mouth daily.  06/07/14   Veryl Speak, MD   BP 103/67 mmHg  Pulse 86  Temp(Src) 98 F (36.7 C) (Oral)  Resp 18  Ht '5\' 7"'$  (1.702 m)  Wt 145 lb (65.772 kg)  BMI 22.71 kg/m2  SpO2 95% Physical Exam  Constitutional: He is oriented to person, place, and time. He appears well-developed and well-nourished. No distress.  HENT:  Head: Normocephalic and atraumatic. Head is without raccoon's eyes and without Battle's sign.  Right Ear: No mastoid tenderness. No hemotympanum.  Left Ear: No mastoid tenderness. No hemotympanum.  Mouth/Throat:  Oropharynx is clear and moist. No oropharyngeal exudate.  Eyes: Conjunctivae and EOM are normal. Pupils are equal, round, and reactive to light.  Neck: Normal range of motion. Neck supple.  Cardiovascular: Normal rate, regular rhythm and intact distal pulses.   Pulmonary/Chest: Effort normal and breath sounds normal. No respiratory distress. He has no wheezes. He has no rales.  Abdominal: Soft. Bowel sounds are normal. There is no tenderness. There is no rebound and no guarding.  Musculoskeletal: Normal range of motion.       Right shoulder: Normal.       Left shoulder: Normal.       Right elbow: Normal.      Left elbow: Normal.       Right knee: Normal.       Left knee: Normal.       Right ankle: Normal. Achilles tendon normal.       Left ankle: Normal. Achilles tendon normal.       Right forearm: Normal.       Left forearm: Normal.       Right hand: He exhibits swelling. He exhibits normal range of motion, no tenderness, no bony tenderness, normal two-point discrimination, normal capillary refill, no deformity and no laceration. Normal sensation noted. Normal strength noted.       Hands:      Right foot: Normal.       Left foot: Normal.  Intact radial pulses b, B hands NVI, no snuff box tenderness of either wrist.  B knees with negative anterior and posterior drawer tests,   Neurological: He is alert and oriented to person, place, and time. He has normal reflexes. He displays normal reflexes.  No crepitance, nor tenderness no step offs of the C, T, nor L spine  Skin: Skin is warm and dry.  Psychiatric: He has a normal mood and affect.  Laughing and joking in the room    ED Course  Procedures (including critical care time) Labs Review Labs Reviewed - No data to display  Imaging Review Dg Hand Complete Right  06/16/2015   CLINICAL DATA:  Right hand pain after fall.  Swelling dorsally.  EXAM: RIGHT HAND - COMPLETE 3+ VIEW  COMPARISON:  None.  FINDINGS: No fracture or dislocation.  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 Joint spaces are otherwise maintained. There is dorsal soft tissue edema.  IMPRESSION: Dorsal soft tissue edema without acute fracture or dislocation.   Electronically Signed   By: Jeb Levering M.D.   On: 06/16/2015 03:11   I have personally reviewed and evaluated these images and lab results as part of my medical decision-making.   EKG Interpretation None      MDM   Final diagnoses:  None   DEA database reviewed.    Has received multiple narcotic RX Oxy IR 10 mg filled on 05/24/15 90 tablets.  Given the patient's history of frequent falls and alcohol usage it is impudent to prescribe narcotics.  He needs to follow up with his PMD for ongoing management.  Ice hand  for 20 minutes every 2 hours     Kaysin Brock, MD 06/16/15 [phone]

## 2015-08-19 ENCOUNTER — Encounter (HOSPITAL_BASED_OUTPATIENT_CLINIC_OR_DEPARTMENT_OTHER): Payer: Self-pay | Admitting: *Deleted

## 2015-08-19 ENCOUNTER — Emergency Department (HOSPITAL_BASED_OUTPATIENT_CLINIC_OR_DEPARTMENT_OTHER)
Admission: EM | Admit: 2015-08-19 | Discharge: 2015-08-19 | Discharge status: Against Medical Advice | Payer: Medicaid Other | Attending: Emergency Medicine | Admitting: Emergency Medicine

## 2015-08-19 ENCOUNTER — Emergency Department (HOSPITAL_BASED_OUTPATIENT_CLINIC_OR_DEPARTMENT_OTHER): Payer: Medicaid Other

## 2015-08-19 DIAGNOSIS — I10 Essential (primary) hypertension: Secondary | ICD-10-CM | POA: Insufficient documentation

## 2015-08-19 DIAGNOSIS — F17210 Nicotine dependence, cigarettes, uncomplicated: Secondary | ICD-10-CM | POA: Diagnosis not present

## 2015-08-19 DIAGNOSIS — J441 Chronic obstructive pulmonary disease with (acute) exacerbation: Secondary | ICD-10-CM | POA: Insufficient documentation

## 2015-08-19 DIAGNOSIS — R0602 Shortness of breath: Secondary | ICD-10-CM

## 2015-08-19 DIAGNOSIS — G8929 Other chronic pain: Secondary | ICD-10-CM | POA: Diagnosis not present

## 2015-08-19 DIAGNOSIS — M791 Myalgia: Secondary | ICD-10-CM | POA: Diagnosis present

## 2015-08-19 NOTE — ED Notes (Signed)
Pt reports leg cramps, hand cramps, feet pain, "legs giving out"- pt on home oxygen but left tank in car- states "lungs rattling real bad"

## 2015-08-19 NOTE — ED Notes (Signed)
Patient called for in waiting room and not found X2

## 2015-08-19 NOTE — ED Notes (Signed)
Called for patient again. No answer.

## 2016-03-01 ENCOUNTER — Encounter (HOSPITAL_COMMUNITY): Payer: Self-pay | Admitting: Emergency Medicine

## 2016-03-01 ENCOUNTER — Emergency Department (HOSPITAL_COMMUNITY)
Admission: EM | Admit: 2016-03-01 | Discharge: 2016-03-02 | Disposition: A | Discharge status: Home / Self Care | Payer: Medicaid Other | Attending: Emergency Medicine | Admitting: Emergency Medicine

## 2016-03-01 ENCOUNTER — Emergency Department (HOSPITAL_COMMUNITY): Payer: Medicaid Other

## 2016-03-01 DIAGNOSIS — Z86718 Personal history of other venous thrombosis and embolism: Secondary | ICD-10-CM | POA: Insufficient documentation

## 2016-03-01 DIAGNOSIS — F17210 Nicotine dependence, cigarettes, uncomplicated: Secondary | ICD-10-CM | POA: Insufficient documentation

## 2016-03-01 DIAGNOSIS — G8929 Other chronic pain: Secondary | ICD-10-CM | POA: Diagnosis not present

## 2016-03-01 DIAGNOSIS — J449 Chronic obstructive pulmonary disease, unspecified: Secondary | ICD-10-CM | POA: Insufficient documentation

## 2016-03-01 DIAGNOSIS — Z8673 Personal history of transient ischemic attack (TIA), and cerebral infarction without residual deficits: Secondary | ICD-10-CM | POA: Diagnosis not present

## 2016-03-01 DIAGNOSIS — Y998 Other external cause status: Secondary | ICD-10-CM | POA: Diagnosis not present

## 2016-03-01 DIAGNOSIS — I10 Essential (primary) hypertension: Secondary | ICD-10-CM | POA: Diagnosis not present

## 2016-03-01 DIAGNOSIS — Z86711 Personal history of pulmonary embolism: Secondary | ICD-10-CM | POA: Diagnosis not present

## 2016-03-01 DIAGNOSIS — S299XXA Unspecified injury of thorax, initial encounter: Secondary | ICD-10-CM | POA: Diagnosis present

## 2016-03-01 DIAGNOSIS — Z880 Allergy status to penicillin: Secondary | ICD-10-CM | POA: Diagnosis not present

## 2016-03-01 DIAGNOSIS — Z7901 Long term (current) use of anticoagulants: Secondary | ICD-10-CM | POA: Insufficient documentation

## 2016-03-01 DIAGNOSIS — R000 Tachycardia, unspecified: Secondary | ICD-10-CM | POA: Insufficient documentation

## 2016-03-01 DIAGNOSIS — E039 Hypothyroidism, unspecified: Secondary | ICD-10-CM | POA: Diagnosis not present

## 2016-03-01 DIAGNOSIS — W108XXA Fall (on) (from) other stairs and steps, initial encounter: Secondary | ICD-10-CM | POA: Insufficient documentation

## 2016-03-01 DIAGNOSIS — E538 Deficiency of other specified B group vitamins: Secondary | ICD-10-CM | POA: Insufficient documentation

## 2016-03-01 DIAGNOSIS — Y92009 Unspecified place in unspecified non-institutional (private) residence as the place of occurrence of the external cause: Secondary | ICD-10-CM | POA: Insufficient documentation

## 2016-03-01 DIAGNOSIS — I252 Old myocardial infarction: Secondary | ICD-10-CM | POA: Insufficient documentation

## 2016-03-01 DIAGNOSIS — F329 Major depressive disorder, single episode, unspecified: Secondary | ICD-10-CM | POA: Insufficient documentation

## 2016-03-01 DIAGNOSIS — M1990 Unspecified osteoarthritis, unspecified site: Secondary | ICD-10-CM | POA: Diagnosis not present

## 2016-03-01 DIAGNOSIS — Z9981 Dependence on supplemental oxygen: Secondary | ICD-10-CM | POA: Diagnosis not present

## 2016-03-01 DIAGNOSIS — Y9389 Activity, other specified: Secondary | ICD-10-CM | POA: Insufficient documentation

## 2016-03-01 DIAGNOSIS — R079 Chest pain, unspecified: Secondary | ICD-10-CM | POA: Diagnosis not present

## 2016-03-01 DIAGNOSIS — Z8505 Personal history of malignant neoplasm of liver: Secondary | ICD-10-CM | POA: Diagnosis not present

## 2016-03-01 DIAGNOSIS — Z8701 Personal history of pneumonia (recurrent): Secondary | ICD-10-CM | POA: Diagnosis not present

## 2016-03-01 DIAGNOSIS — Z79899 Other long term (current) drug therapy: Secondary | ICD-10-CM | POA: Diagnosis not present

## 2016-03-01 LAB — BASIC METABOLIC PANEL
Anion gap: 13 (ref 5–15)
BUN: <5 mg/dL — ABNORMAL LOW (ref 6–20)
CHLORIDE: 107 mmol/L (ref 101–111)
CO2: 20 mmol/L — ABNORMAL LOW (ref 22–32)
CREATININE: 0.92 mg/dL (ref 0.61–1.24)
Calcium: 9 mg/dL (ref 8.9–10.3)
Glucose, Bld: 99 mg/dL (ref 65–99)
Potassium: 3.1 mmol/L — ABNORMAL LOW (ref 3.5–5.1)
Sodium: 140 mmol/L (ref 135–145)

## 2016-03-01 LAB — CBC
HEMATOCRIT: 35.9 % — AB (ref 39.0–52.0)
Hemoglobin: 11.5 g/dL — ABNORMAL LOW (ref 13.0–17.0)
MCH: 27.4 pg (ref 26.0–34.0)
MCHC: 32 g/dL (ref 30.0–36.0)
MCV: 85.5 fL (ref 78.0–100.0)
Platelets: 401 10*3/uL — ABNORMAL HIGH (ref 150–400)
RBC: 4.2 MIL/uL — ABNORMAL LOW (ref 4.22–5.81)
RDW: 17.2 % — AB (ref 11.5–15.5)
WBC: 5 10*3/uL (ref 4.0–10.5)

## 2016-03-01 LAB — I-STAT TROPONIN, ED: Troponin i, poc: 0 ng/mL (ref 0.00–0.08)

## 2016-03-01 MED ORDER — POTASSIUM CHLORIDE CRYS ER 20 MEQ PO TBCR
40.0000 meq | EXTENDED_RELEASE_TABLET | Freq: Once | ORAL | Status: AC
  Administered 2016-03-01: 40 meq via ORAL
  Filled 2016-03-01: qty 2

## 2016-03-01 MED ORDER — SODIUM CHLORIDE 0.9 % IV BOLUS (SEPSIS)
1000.0000 mL | Freq: Once | INTRAVENOUS | Status: AC
  Administered 2016-03-01: 1000 mL via INTRAVENOUS

## 2016-03-01 MED ORDER — HYDROMORPHONE HCL 1 MG/ML IJ SOLN
1.0000 mg | Freq: Once | INTRAMUSCULAR | Status: AC
  Administered 2016-03-01: 1 mg via INTRAVENOUS
  Filled 2016-03-01: qty 1

## 2016-03-01 NOTE — ED Provider Notes (Signed)
CSN: [id number]     Arrival date & time 03/01/16  1918 History  By signing my name below, I, Meriel Flavors, attest that this documentation has been prepared under the direction and in the presence of Everlene Balls, MD. Electronically signed, Meriel Flavors, ED Scribe. 03/01/2016. 11:41 PM.    Chief Complaint  Patient presents with  . Chest Injury   The history is provided by the patient. No language interpreter was used.   HPI Comments: Robert [name] is a 55 y.o. male with a PMHx of DVT, PE, COPD and lung CA, who presents to the Emergency Department complaining of constant, gradually worsening, generalized chest pain onset one week ago. Pt reports he fell on forward on his steps at his house and was seen at [facility] and was told he broke his sternum about two weeks ago. Pt takes oxycodone for pain. Pt also reports he wears 3L of O2 via [place] at home. Pt reports he has had 3 blood transfusions in the past. Pt takes Xarelto '15mg'$  qd. Pt also reports he is not doing chemotherapy for his cancer.  Past Medical History  Diagnosis Date  . Deep vein thrombosis (HCC)     "several"  . Pulmonary emboli (HCC)     "several"  . COPD (chronic obstructive pulmonary disease) ([place])   . Partial small bowel obstruction ([place])   . Myocardial infarction ([place]) 2007; ~ 2011  . Chronic liver failure ([place])   . Shortness of breath     "all the time"  . Oxygen dependent     3L; 24/7" (05/27/2014)  . History of blood transfusion 2013    "related to kidneys shutting down"  . Hepatitis C   . Depression   . Hypertension   . Hypothyroidism   . Asthma   . Pneumonia     "several times"  . GERD (gastroesophageal reflux disease)   . Migraines     "2-3/day" (05/27/2014)  . Stroke [facility]) ~ 03/2012    "couldn't use my left hand for ~ 6 months" (05/27/2014)  . Stroke [facility]) 05/25/2014    "not able to use my left hand again" (05/27/2014)  . Arthritis     "I'm eat up w/it"  . Chronic lower back pain   . Anxiety   .  Intermittent self-catheterization of bladder ([place])   . Liver cancer ([place])   . Metastatic lung cancer     "left"  . B12 deficiency     "give myself shots"   Past Surgical History  Procedure Laterality Date  . Transurethral resection of prostate  2012; 2014  . Tonsillectomy and adenoidectomy  1974  . Knee surgery Left ~ 1972    "cut top of my kneecap off"  . Multiple tooth extractions  11/2010    "26"  . Incision and drainage abscess Left 05/28/2014    Procedure: INCISION AND DRAINAGE ABSCESS LEFT WRIST;  Surgeon: Leanora Cover, MD;  Location: [place];  Service: Orthopedics;  Laterality: Left;   Family History  Problem Relation Age of Onset  . Cancer Mother   . Cancer Paternal Aunt   . Cancer Maternal Aunt    Social History  Substance Use Topics  . Smoking status: Current Every Day Smoker -- 0.50 packs/day for 41 years    Types: Cigarettes  . Smokeless tobacco: Former Systems developer  . Alcohol Use: Yes     Comment: 8 drinks a week    Review of Systems  Constitutional: Negative for fever and diaphoresis.  Cardiovascular: Positive for chest pain (Generalized).  All other systems reviewed and are negative.     Allergies  Penicillins and Morphine and related  Home Medications   Prior to Admission medications   Medication Sig Start Date End Date Taking? Authorizing Provider  albuterol (PROVENTIL HFA;VENTOLIN HFA) 108 (90 BASE) MCG/ACT inhaler Inhale 1-2 puffs into the lungs every 6 (six) hours as needed for wheezing or shortness of breath. 09/20/14   Quintella Reichert, MD  Aspirin-Salicylamide-Caffeine [facility] HEADACHE POWDER PO) Take 1 packet by mouth daily as needed (pain).    Historical Provider, MD  atenolol-chlorthalidone (TENORETIC) 50-25 MG per tablet Take 1 tablet by mouth daily.    Historical Provider, MD  azithromycin (ZITHROMAX Z-PAK) 250 MG tablet Take two tablets by mouth on day one and one tablet by mouth daily on days 2-5 09/20/14   Quintella Reichert, MD   butalbital-acetaminophen-caffeine (ESGIC PLUS) 50-500-40 MG per tablet Take 1 tablet by mouth every 4 (four) hours as needed (migraines).     Historical Provider, MD  cyanocobalamin (,VITAMIN B-12,) 1000 MCG/ML injection Inject 1,000 mcg into the muscle once a week. On monday    Historical Provider, MD  dextromethorphan-guaiFENesin [facility] DM) 30-600 MG per 12 hr tablet Take 1 tablet by mouth 2 (two) times daily as needed for cough (cough).    Historical Provider, MD  diazepam (VALIUM) 10 MG tablet Take 1 tablet (10 mg total) by mouth daily. 06/07/14   Veryl Speak, MD  doxycycline (VIBRAMYCIN) 100 MG capsule Take 1 capsule (100 mg total) by mouth 2 (two) times daily. 02/25/15   Blanchie Dessert, MD  DULoxetine (CYMBALTA) 60 MG capsule Take 1 capsule (60 mg total) by mouth 2 (two) times daily. 06/07/14   Veryl Speak, MD  gabapentin (NEURONTIN) 400 MG capsule Take 1 capsule (400 mg total) by mouth 3 (three) times daily. Patient taking differently: Take 2,000 mg by mouth 3 (three) times daily.  06/07/14   Veryl Speak, MD  Ipratropium-Albuterol (COMBIVENT RESPIMAT) 20-100 MCG/ACT AERS respimat Inhale 1 puff into the lungs daily.    Historical Provider, MD  levothyroxine (SYNTHROID, LEVOTHROID) 50 MCG tablet Take 1 tablet (50 mcg total) by mouth daily before breakfast. 06/07/14   Veryl Speak, MD  lidocaine (LIDODERM) 5 % Place 1 patch onto the skin daily. Remove & Discard patch within 12 hours or as directed by MD Patient not taking: Reported on 02/22/2015 10/13/14   Orpah Greek, MD  Multiple Vitamins-Minerals (MULTIVITAMIN WITH MINERALS) tablet Take 1 tablet by mouth daily.    Historical Provider, MD  naproxen sodium (ANAPROX) 220 MG tablet Take 660 mg by mouth daily as needed (pain).    Historical Provider, MD  omeprazole (PRILOSEC) 20 MG capsule Take 1 capsule (20 mg total) by mouth 2 (two) times daily. Patient taking differently: Take 20 mg by mouth daily as needed (heartburn).  06/07/14   Veryl Speak, MD  Oxycodone HCl 10 MG TABS Take 1-2 tablets (10-20 mg total) by mouth every 6 (six) hours as needed. 08/21/14   Blanchie Dessert, MD  oxyCODONE-acetaminophen (PERCOCET/ROXICET) 5-325 MG per tablet Take 1 tablet by mouth every 6 (six) hours as needed for moderate pain or severe pain. 09/20/14   Quintella Reichert, MD  Potassium 99 MG TABS Take 1 tablet by mouth daily.    Historical Provider, MD  potassium chloride (K-DUR) 10 MEQ tablet Take 2 tablets (20 mEq total) by mouth 2 (two) times daily. 02/22/15   Charlesetta Shanks, MD  potassium chloride SA (  K-DUR,KLOR-CON) 20 MEQ tablet Take 1 tablet (20 mEq total) by mouth at bedtime. Patient not taking: Reported on 02/22/2015 06/07/14   Veryl Speak, MD  predniSONE (DELTASONE) 10 MG tablet Take 4 tablets (40 mg total) by mouth daily. Patient taking differently: Take 10 mg by mouth 2 (two) times daily with a meal.  09/20/14   Quintella Reichert, MD  promethazine (PHENERGAN) 25 MG tablet Take 1 tablet (25 mg total) by mouth 2 (two) times daily. Patient taking differently: Take 25 mg by mouth 2 (two) times daily as needed for nausea or vomiting (nausea and vomiting).  06/07/14   Veryl Speak, MD  Rivaroxaban (XARELTO) 15 MG TABS tablet Take 15 mg by mouth daily.     Historical Provider, MD  tamsulosin (FLOMAX) 0.4 MG CAPS capsule Take 1 capsule (0.4 mg total) by mouth at bedtime. Patient taking differently: Take 0.8 mg by mouth daily.  06/07/14   Veryl Speak, MD   BP 159/99 mmHg  Pulse 97  Temp(Src) 98.3 F (36.8 C) (Oral)  Resp 20  SpO2 100% Physical Exam  Constitutional: He is oriented to person, place, and time. Vital signs are normal. He appears well-developed and well-nourished.  Non-toxic appearance. He does not appear ill. No distress.  HENT:  Head: Normocephalic and atraumatic.  Nose: Nose normal.  Mouth/Throat: Oropharynx is clear and moist. No oropharyngeal exudate.  Eyes: Conjunctivae and EOM are normal. Pupils are equal, round, and reactive to  light. No scleral icterus.  Neck: Normal range of motion. Neck supple. No tracheal deviation, no edema, no erythema and normal range of motion present. No thyroid mass and no thyromegaly present.  Cardiovascular: Regular rhythm, S1 normal, S2 normal, normal heart sounds, intact distal pulses and normal pulses.  Exam reveals no gallop and no friction rub.   No murmur heard. Tachycardia  Pulmonary/Chest: Effort normal and breath sounds normal. No respiratory distress. He has no wheezes. He has no rhonchi. He has no rales. He exhibits tenderness.  [place] in place,  Chest wall tender to palpation.  Abdominal: Soft. Normal appearance and bowel sounds are normal. He exhibits no distension, no ascites and no mass. There is no hepatosplenomegaly. There is no tenderness. There is no rebound, no guarding and no CVA tenderness.  Musculoskeletal: Normal range of motion. He exhibits no edema or tenderness.  Lymphadenopathy:    He has no cervical adenopathy.  Neurological: He is alert and oriented to person, place, and time. He has normal strength. No cranial nerve deficit or sensory deficit.  Skin: Skin is warm, dry and intact. No petechiae and no rash noted. He is not diaphoretic. No erythema. No pallor.  Psychiatric: He has a normal mood and affect. His behavior is normal. Judgment normal.  Nursing note and vitals reviewed.   ED Course  Procedures  DIAGNOSTIC STUDIES: Oxygen Saturation is 100% on [place], normal by my interpretation.  COORDINATION OF CARE: 11:37 PM-Pain medication, CT scan. Discussed treatment plan with pt at bedside and pt agreed to plan.   Labs Review Labs Reviewed  BASIC METABOLIC PANEL - Abnormal; Notable for the following:    Potassium 3.1 (*)    CO2 20 (*)    BUN <5 (*)    All other components within normal limits  CBC - Abnormal; Notable for the following:    RBC 4.20 (*)    Hemoglobin 11.5 (*)    HCT 35.9 (*)    RDW 17.2 (*)    Platelets 401 (*)    All  other components  within normal limits  I-STAT TROPOININ, ED    Imaging Review Dg Chest 2 View  03/01/2016  CLINICAL DATA:  Status post fall 1 week ago, with sternal fracture. Initial encounter. EXAM: CHEST  2 VIEW COMPARISON:  Chest radiograph from 02/22/2015 FINDINGS: The lungs are well-aerated and clear. There is no evidence of focal opacification, pleural effusion or pneumothorax. A nodular density at the peripheral right lung apex is stable from prior studies and likely reflects chronic pleural thickening. The heart is normal in size; the mediastinal contour is within normal limits. The known sternal fracture is not well characterized on the current study. IMPRESSION: No acute cardiopulmonary process seen. Known sternal fracture is not well characterized on the current study. Electronically Signed   By: Garald Balding M.D.   On: 03/01/2016 20:38   Ct Angio Chest Pe W/cm &/or Wo Cm  03/02/2016  CLINICAL DATA:  Acute onset of generalized chest pain. Initial encounter. EXAM: CT ANGIOGRAPHY CHEST WITH CONTRAST TECHNIQUE: Multidetector CT imaging of the chest was performed using the standard protocol during bolus administration of intravenous contrast. Multiplanar CT image reconstructions and MIPs were obtained to evaluate the vascular anatomy. CONTRAST:  100 mL of Isovue 370 IV contrast COMPARISON:  Chest radiograph performed 03/01/2016 FINDINGS: There is no evidence of pulmonary embolus. Mild bibasilar atelectasis noted. Mild bilateral emphysematous change is seen, with scattered blebs. There is no evidence of significant focal consolidation, pleural effusion or pneumothorax. No masses are identified; no abnormal focal contrast enhancement is seen. Scattered coronary artery calcifications are seen. Visualized mediastinal nodes remain normal in size. No pericardial effusion is identified. The great vessels are grossly unremarkable. No axillary lymphadenopathy is seen. The visualized portions of the thyroid gland are  unremarkable in appearance. The visualized portions of the liver and spleen are unremarkable. The visualized portions of the pancreas, gallbladder, stomach, adrenal glands and kidneys are within normal limits. No acute osseous abnormalities are seen. There is a chronic fracture through the superior aspect of the body of the sternum, with associated sclerotic change. Review of the MIP images confirms the above findings. IMPRESSION: 1. No evidence of pulmonary embolus. 2. Mild bibasilar atelectasis noted. Mild bilateral emphysema noted, with scattered blebs. 3. Scattered coronary artery calcifications seen. 4. Chronic fracture through the superior aspect of the body of the sternum, with associated sclerotic change. Electronically Signed   By: Garald Balding M.D.   On: 03/02/2016 01:10   I have personally reviewed and evaluated these images and lab results as part of my medical decision-making.   EKG Interpretation   Date/Time:  Thursday March 01 2016 19:26:52 EDT Ventricular Rate:  105 PR Interval:  140 QRS Duration: 92 QT Interval:  358 QTC Calculation: 473 R Axis:   76 Text Interpretation:  Sinus tachycardia Abnormal ECG tachycardia now  present Confirmed by Glynn Octave [phone]) on 03/01/2016 11:23:29 PM      MDM   Final diagnoses:  None    Patient presents to the ED for worsening CP due to sternal fracture.  He is also describing symptoms of possible PE despite being on xarelto.  I obtained CT scan which is neg for any lung cancer, PE.  It does show an old sternal fracture however.  Patient is now stating that his PCP is trying to wean him off of narcotics for this and it is causing him more pain. He was initially given dilaudid in the ED for pain control.  Will DC with 8 tabs  of oxycodone to last him until his appointment.  He wa sinformed of CT scan results and instructed to continue all meds until he sees his PCP.  He demonstrates good understanding of the plan. He appears well and  in NAD.  He was resting upon repeat evaluation at 2:34 AM in the ED.  VS remain within his normal limits and he is safe for DC.   I personally performed the services described in this documentation, which was scribed in my presence. The recorded information has been reviewed and is accurate.     Everlene Balls, MD 03/02/16 [phone]

## 2016-03-01 NOTE — ED Notes (Signed)
Pt fell 1 week ago and was seen at [facility] with a sterum fracture. Pt states his pain is no better and pt states, " I was supposed to have surgery but no one will do it because of my cancer."

## 2016-03-02 ENCOUNTER — Encounter (HOSPITAL_COMMUNITY): Payer: Self-pay | Admitting: Emergency Medicine

## 2016-03-02 ENCOUNTER — Emergency Department (HOSPITAL_COMMUNITY): Payer: Medicaid Other

## 2016-03-02 ENCOUNTER — Emergency Department (HOSPITAL_COMMUNITY)
Admission: EM | Admit: 2016-03-02 | Discharge: 2016-03-02 | Disposition: A | Discharge status: Home / Self Care | Payer: Medicaid Other | Attending: Emergency Medicine | Admitting: Emergency Medicine

## 2016-03-02 DIAGNOSIS — Z85118 Personal history of other malignant neoplasm of bronchus and lung: Secondary | ICD-10-CM | POA: Diagnosis not present

## 2016-03-02 DIAGNOSIS — F17210 Nicotine dependence, cigarettes, uncomplicated: Secondary | ICD-10-CM | POA: Insufficient documentation

## 2016-03-02 DIAGNOSIS — Z8505 Personal history of malignant neoplasm of liver: Secondary | ICD-10-CM | POA: Diagnosis not present

## 2016-03-02 DIAGNOSIS — G8929 Other chronic pain: Secondary | ICD-10-CM | POA: Diagnosis not present

## 2016-03-02 DIAGNOSIS — Z7982 Long term (current) use of aspirin: Secondary | ICD-10-CM | POA: Diagnosis not present

## 2016-03-02 DIAGNOSIS — I252 Old myocardial infarction: Secondary | ICD-10-CM | POA: Insufficient documentation

## 2016-03-02 DIAGNOSIS — M79609 Pain in unspecified limb: Secondary | ICD-10-CM | POA: Diagnosis not present

## 2016-03-02 DIAGNOSIS — Z7901 Long term (current) use of anticoagulants: Secondary | ICD-10-CM | POA: Insufficient documentation

## 2016-03-02 DIAGNOSIS — Z79899 Other long term (current) drug therapy: Secondary | ICD-10-CM | POA: Diagnosis not present

## 2016-03-02 DIAGNOSIS — E039 Hypothyroidism, unspecified: Secondary | ICD-10-CM | POA: Insufficient documentation

## 2016-03-02 DIAGNOSIS — J449 Chronic obstructive pulmonary disease, unspecified: Secondary | ICD-10-CM | POA: Diagnosis not present

## 2016-03-02 DIAGNOSIS — Z8673 Personal history of transient ischemic attack (TIA), and cerebral infarction without residual deficits: Secondary | ICD-10-CM | POA: Diagnosis not present

## 2016-03-02 DIAGNOSIS — R079 Chest pain, unspecified: Secondary | ICD-10-CM | POA: Diagnosis present

## 2016-03-02 DIAGNOSIS — I10 Essential (primary) hypertension: Secondary | ICD-10-CM | POA: Diagnosis not present

## 2016-03-02 MED ORDER — IOPAMIDOL (ISOVUE-370) INJECTION 76%
100.0000 mL | Freq: Once | INTRAVENOUS | Status: AC | PRN
  Administered 2016-03-02: 100 mL via INTRAVENOUS

## 2016-03-02 MED ORDER — OXYCODONE HCL 5 MG PO TABS: 5.0000 mg | ORAL_TABLET | Freq: Two times a day (BID) | ORAL | Status: DC | PRN

## 2016-03-02 MED ORDER — OXYCODONE-ACETAMINOPHEN 5-325 MG PO TABS
1.0000 | ORAL_TABLET | Freq: Once | ORAL | Status: AC
  Administered 2016-03-02: 1 via ORAL
  Filled 2016-03-02: qty 1

## 2016-03-02 MED FILL — oxyCODONE HCL 5 MG TABS: 5 | 4 days supply | Qty: 8 | Fill #0

## 2016-03-02 NOTE — Discharge Instructions (Signed)
See your doctor as needed for problems, and tomorrow, as scheduled.   Chronic Pain Chronic pain can be defined as pain that is off and on and lasts for 3-6 months or longer. Many things cause chronic pain, which can make it difficult to make a diagnosis. There are many treatment options available for chronic pain. However, finding a treatment that works well for you may require trying various approaches until the right one is found. Many people benefit from a combination of two or more types of treatment to control their pain. SYMPTOMS  Chronic pain can occur anywhere in the body and can range from mild to very severe. Some types of chronic pain include:  Headache.  Low back pain.  Cancer pain.  Arthritis pain.  Neurogenic pain. This is pain resulting from damage to nerves. People with chronic pain may also have other symptoms such as:  Depression.  Anger.  Insomnia.  Anxiety. DIAGNOSIS  Your health care provider will help diagnose your condition over time. In many cases, the initial focus will be on excluding possible conditions that could be causing the pain. Depending on your symptoms, your health care provider may order tests to diagnose your condition. Some of these tests may include:   Blood tests.   CT scan.   MRI.   X-rays.   Ultrasounds.   Nerve conduction studies.  You may need to see a specialist.  TREATMENT  Finding treatment that works well may take time. You may be referred to a pain specialist. He or she may prescribe medicine or therapies, such as:   Mindful meditation or yoga.  Shots (injections) of numbing or pain-relieving medicines into the spine or area of pain.  Local electrical stimulation.  Acupuncture.   Massage therapy.   Aroma, color, light, or sound therapy.   Biofeedback.   Working with a physical therapist to keep from getting stiff.   Regular, gentle exercise.   Cognitive or behavioral therapy.   Group  support.  Sometimes, surgery may be recommended.  HOME CARE INSTRUCTIONS   Take all medicines as directed by your health care provider.   Lessen stress in your life by relaxing and doing things such as listening to calming music.   Exercise or be active as directed by your health care provider.   Eat a healthy diet and include things such as vegetables, fruits, fish, and lean meats in your diet.   Keep all follow-up appointments with your health care provider.   Attend a support group with others suffering from chronic pain. SEEK MEDICAL CARE IF:   Your pain gets worse.   You develop a new pain that was not there before.   You cannot tolerate medicines given to you by your health care provider.   You have new symptoms since your last visit with your health care provider.  SEEK IMMEDIATE MEDICAL CARE IF:   You feel weak.   You have decreased sensation or numbness.   You lose control of bowel or bladder function.   Your pain suddenly gets much worse.   You develop shaking.  You develop chills.  You develop confusion.  You develop chest pain.  You develop shortness of breath.  MAKE SURE YOU:  Understand these instructions.  Will watch your condition.  Will get help right away if you are not doing well or get worse.   This information is not intended to replace advice given to you by your health care provider. Make sure you discuss any  questions you have with your health care provider.   Document Released: 06/09/2002 Document Revised: 05/20/2013 Document Reviewed: 03/13/2013 Elsevier Interactive Patient Education [employer].

## 2016-03-02 NOTE — ED Notes (Signed)
Patient's speech is not clear.  He mumbles, speech in not always understandable. He reports taking only one vicodin this AM. Patient is very sleepy.

## 2016-03-02 NOTE — Discharge Instructions (Signed)
Nonspecific Chest Pain Mr. Guerrette, your CT scan does not show any blood clots.  See your primary care physician within 3 days for close follow-up. If any symptoms worsen, come back to emergency department immediately. Thank you. It is often hard to find the cause of chest pain. There is always a chance that your pain could be related to something serious, such as a heart attack or a blood clot in your lungs. Chest pain can also be caused by conditions that are not life-threatening. If you have chest pain, it is very important to follow up with your doctor.  HOME CARE  If you were prescribed an antibiotic medicine, finish it all even if you start to feel better.  Avoid any activities that cause chest pain.  Do not use any tobacco products, including cigarettes, chewing tobacco, or electronic cigarettes. If you need help quitting, ask your doctor.  Do not drink alcohol.  Take medicines only as told by your doctor.  Keep all follow-up visits as told by your doctor. This is important. This includes any further testing if your chest pain does not go away.  Your doctor may tell you to keep your head raised (elevated) while you sleep.  Make lifestyle changes as told by your doctor. These may include:  Getting regular exercise. Ask your doctor to suggest some activities that are safe for you.  Eating a heart-healthy diet. Your doctor or a diet specialist (dietitian) can help you to learn healthy eating options.  Maintaining a healthy weight.  Managing diabetes, if necessary.  Reducing stress. GET HELP IF:  Your chest pain does not go away, even after treatment.  You have a rash with blisters on your chest.  You have a fever. GET HELP RIGHT AWAY IF:  Your chest pain is worse.  You have an increasing cough, or you cough up blood.  You have severe belly (abdominal) pain.  You feel extremely weak.  You pass out (faint).  You have chills.  You have sudden, unexplained chest  discomfort.  You have sudden, unexplained discomfort in your arms, back, neck, or jaw.  You have shortness of breath at any time.  You suddenly start to sweat, or your skin gets clammy.  You feel nauseous.  You vomit.  You suddenly feel light-headed or dizzy.  Your heart begins to beat quickly, or it feels like it is skipping beats. These symptoms may be an emergency. Do not wait to see if the symptoms will go away. Get medical help right away. Call your local emergency services (911 in the [place] Do not drive yourself to the hospital.   This information is not intended to replace advice given to you by your health care provider. Make sure you discuss any questions you have with your health care provider.   Document Released: 03/05/2008 Document Revised: 10/08/2014 Document Reviewed: 04/23/2014 Elsevier Interactive Patient Education [employer].

## 2016-03-02 NOTE — ED Notes (Signed)
Pt here with pain to legs and sternum. Pt sts he fell and broke his sternum 3 weeks ago. Pt was seen last night for same.

## 2016-03-02 NOTE — ED Provider Notes (Signed)
CSN: [id number]     Arrival date & time 03/02/16  1201 History   First MD Initiated Contact with Patient 03/02/16 1555     No chief complaint on file.    (Consider location/radiation/quality/duration/timing/severity/associated sxs/prior Treatment) HPI  Robert [name] is a 55 y.o. male patient presents for evaluation of pain in chest, fractured sternum, and leg pain. He is unable to state exactly when these problems started. He was here overnight last night and left around 3 AM. At discharge, she was given a prescription for oxycodone No. 8. He states that he needs more medication. He states that he has an appointment tomorrow with his chronic pain doctor. He denies chest pain, syncope, vomiting. There are no other known modifying factors.    Past Medical History  Diagnosis Date  . Deep vein thrombosis (HCC)     "several"  . Pulmonary emboli (HCC)     "several"  . COPD (chronic obstructive pulmonary disease) ([place])   . Partial small bowel obstruction ([place])   . Myocardial infarction ([place]) 2007; ~ 2011  . Chronic liver failure ([place])   . Shortness of breath     "all the time"  . Oxygen dependent     3L; 24/7" (05/27/2014)  . History of blood transfusion 2013    "related to kidneys shutting down"  . Hepatitis C   . Depression   . Hypertension   . Hypothyroidism   . Asthma   . Pneumonia     "several times"  . GERD (gastroesophageal reflux disease)   . Migraines     "2-3/day" (05/27/2014)  . Stroke [facility]) ~ 03/2012    "couldn't use my left hand for ~ 6 months" (05/27/2014)  . Stroke [facility]) 05/25/2014    "not able to use my left hand again" (05/27/2014)  . Arthritis     "I'm eat up w/it"  . Chronic lower back pain   . Anxiety   . Intermittent self-catheterization of bladder ([place])   . Liver cancer ([place])   . Metastatic lung cancer     "left"  . B12 deficiency     "give myself shots"   Past Surgical History  Procedure Laterality Date  . Transurethral resection of prostate   2012; 2014  . Tonsillectomy and adenoidectomy  1974  . Knee surgery Left ~ 1972    "cut top of my kneecap off"  . Multiple tooth extractions  11/2010    "26"  . Incision and drainage abscess Left 05/28/2014    Procedure: INCISION AND DRAINAGE ABSCESS LEFT WRIST;  Surgeon: Leanora Cover, MD;  Location: [facility];  Service: Orthopedics;  Laterality: Left;   Family History  Problem Relation Age of Onset  . Cancer Mother   . Cancer Paternal Aunt   . Cancer Maternal Aunt    Social History  Substance Use Topics  . Smoking status: Current Every Day Smoker -- 0.50 packs/day for 41 years    Types: Cigarettes  . Smokeless tobacco: Former Systems developer  . Alcohol Use: Yes     Comment: 8 drinks a week    Review of Systems  All other systems reviewed and are negative.     Allergies  Penicillins and Morphine and related  Home Medications   Prior to Admission medications   Medication Sig Start Date End Date Taking? Authorizing Provider  albuterol (PROVENTIL HFA;VENTOLIN HFA) 108 (90 BASE) MCG/ACT inhaler Inhale 1-2 puffs into the lungs every 6 (six) hours as needed for wheezing or shortness of breath.  09/20/14   Quintella Reichert, MD  Aspirin-Salicylamide-Caffeine [facility] HEADACHE POWDER PO) Take 1 packet by mouth daily as needed (pain).    Historical Provider, MD  atenolol-chlorthalidone (TENORETIC) 50-25 MG per tablet Take 1 tablet by mouth daily.    Historical Provider, MD  azithromycin (ZITHROMAX Z-PAK) 250 MG tablet Take two tablets by mouth on day one and one tablet by mouth daily on days 2-5 09/20/14   Quintella Reichert, MD  butalbital-acetaminophen-caffeine (ESGIC PLUS) 50-500-40 MG per tablet Take 1 tablet by mouth every 4 (four) hours as needed (migraines).     Historical Provider, MD  cyanocobalamin (,VITAMIN B-12,) 1000 MCG/ML injection Inject 1,000 mcg into the muscle once a week. On monday    Historical Provider, MD  dextromethorphan-guaiFENesin [facility] DM) 30-600 MG per 12 hr tablet Take 1 tablet by  mouth 2 (two) times daily as needed for cough (cough).    Historical Provider, MD  diazepam (VALIUM) 10 MG tablet Take 1 tablet (10 mg total) by mouth daily. 06/07/14   Veryl Speak, MD  doxycycline (VIBRAMYCIN) 100 MG capsule Take 1 capsule (100 mg total) by mouth 2 (two) times daily. 02/25/15   Blanchie Dessert, MD  DULoxetine (CYMBALTA) 60 MG capsule Take 1 capsule (60 mg total) by mouth 2 (two) times daily. 06/07/14   Veryl Speak, MD  gabapentin (NEURONTIN) 400 MG capsule Take 1 capsule (400 mg total) by mouth 3 (three) times daily. Patient taking differently: Take 2,000 mg by mouth 3 (three) times daily.  06/07/14   Veryl Speak, MD  Ipratropium-Albuterol (COMBIVENT RESPIMAT) 20-100 MCG/ACT AERS respimat Inhale 1 puff into the lungs daily.    Historical Provider, MD  levothyroxine (SYNTHROID, LEVOTHROID) 50 MCG tablet Take 1 tablet (50 mcg total) by mouth daily before breakfast. 06/07/14   Veryl Speak, MD  lidocaine (LIDODERM) 5 % Place 1 patch onto the skin daily. Remove & Discard patch within 12 hours or as directed by MD Patient not taking: Reported on 02/22/2015 10/13/14   Orpah Greek, MD  Multiple Vitamins-Minerals (MULTIVITAMIN WITH MINERALS) tablet Take 1 tablet by mouth daily.    Historical Provider, MD  naproxen sodium (ANAPROX) 220 MG tablet Take 660 mg by mouth daily as needed (pain).    Historical Provider, MD  omeprazole (PRILOSEC) 20 MG capsule Take 1 capsule (20 mg total) by mouth 2 (two) times daily. Patient taking differently: Take 20 mg by mouth daily as needed (heartburn).  06/07/14   Veryl Speak, MD  oxyCODONE (ROXICODONE) 5 MG immediate release tablet Take 1 tablet (5 mg total) by mouth 2 (two) times daily as needed for severe pain. 03/02/16   Everlene Balls, MD  oxyCODONE-acetaminophen (PERCOCET/ROXICET) 5-325 MG per tablet Take 1 tablet by mouth every 6 (six) hours as needed for moderate pain or severe pain. 09/20/14   Quintella Reichert, MD  Potassium 99 MG TABS Take 1 tablet by  mouth daily.    Historical Provider, MD  potassium chloride (K-DUR) 10 MEQ tablet Take 2 tablets (20 mEq total) by mouth 2 (two) times daily. 02/22/15   Charlesetta Shanks, MD  potassium chloride SA (K-DUR,KLOR-CON) 20 MEQ tablet Take 1 tablet (20 mEq total) by mouth at bedtime. Patient not taking: Reported on 02/22/2015 06/07/14   Veryl Speak, MD  predniSONE (DELTASONE) 10 MG tablet Take 4 tablets (40 mg total) by mouth daily. Patient taking differently: Take 10 mg by mouth 2 (two) times daily with a meal.  09/20/14   Quintella Reichert, MD  promethazine (PHENERGAN) 25 MG  tablet Take 1 tablet (25 mg total) by mouth 2 (two) times daily. Patient taking differently: Take 25 mg by mouth 2 (two) times daily as needed for nausea or vomiting (nausea and vomiting).  06/07/14   Veryl Speak, MD  Rivaroxaban (XARELTO) 15 MG TABS tablet Take 15 mg by mouth daily.     Historical Provider, MD  tamsulosin (FLOMAX) 0.4 MG CAPS capsule Take 1 capsule (0.4 mg total) by mouth at bedtime. Patient taking differently: Take 0.8 mg by mouth daily.  06/07/14   Veryl Speak, MD   BP 147/89 mmHg  Pulse 72  Temp(Src) 97.3 F (36.3 C) (Oral)  Resp 19  Ht '5\' 7"'$  (1.702 m)  Wt 160 lb (72.576 kg)  BMI 25.05 kg/m2  SpO2 100% Physical Exam  Constitutional: He appears well-developed.  Disheveled. Appears older than stated age.  HENT:  Head: Normocephalic and atraumatic.  Right Ear: External ear normal.  Left Ear: External ear normal.  Eyes: Conjunctivae and EOM are normal. Pupils are equal, round, and reactive to light.  Neck: Normal range of motion and phonation normal. Neck supple.  Cardiovascular: Normal rate, regular rhythm and normal heart sounds.   Pulmonary/Chest: Effort normal and breath sounds normal. He exhibits no bony tenderness.  Abdominal: Soft. There is no tenderness.  Musculoskeletal: Normal range of motion. He exhibits no edema or tenderness.  Normal gait  Neurological: He is alert. No cranial nerve deficit or  sensory deficit. He exhibits normal muscle tone. Coordination normal.  Dysarthria consistent with intoxication  Skin: Skin is warm, dry and intact.  Psychiatric: He has a normal mood and affect. His behavior is normal.  Nursing note and vitals reviewed.   ED Course  Procedures (including critical care time) Medications - No data to display  Patient Vitals for the past 24 hrs:  BP Temp Temp src Pulse Resp SpO2 Height Weight  03/02/16 1554 147/89 mmHg 97.3 F (36.3 C) Oral 72 19 100 % - -  03/02/16 1438 - - - - - 94 % - -  03/02/16 1419 114/88 mmHg 97.9 F (36.6 C) Oral 94 18 (!) 78 % - -  03/02/16 1231 114/91 mmHg 97.8 F (36.6 C) Oral 98 18 98 % '5\' 7"'$  (1.702 m) 160 lb (72.576 kg)    4:27 PM Reevaluation with update and discussion. After initial assessment and treatment, an updated evaluation reveals No additional complaints. Findings discussed with patient, all questions were answered. [place] Review Labs Reviewed - No data to display  Imaging Review Dg Chest 2 View  03/01/2016  CLINICAL DATA:  Status post fall 1 week ago, with sternal fracture. Initial encounter. EXAM: CHEST  2 VIEW COMPARISON:  Chest radiograph from 02/22/2015 FINDINGS: The lungs are well-aerated and clear. There is no evidence of focal opacification, pleural effusion or pneumothorax. A nodular density at the peripheral right lung apex is stable from prior studies and likely reflects chronic pleural thickening. The heart is normal in size; the mediastinal contour is within normal limits. The known sternal fracture is not well characterized on the current study. IMPRESSION: No acute cardiopulmonary process seen. Known sternal fracture is not well characterized on the current study. Electronically Signed   By: Garald Balding M.D.   On: 03/01/2016 20:38   Ct Angio Chest Pe W/cm &/or Wo Cm  03/02/2016  CLINICAL DATA:  Acute onset of generalized chest pain. Initial encounter. EXAM: CT ANGIOGRAPHY CHEST  WITH CONTRAST TECHNIQUE: Multidetector CT imaging of the  chest was performed using the standard protocol during bolus administration of intravenous contrast. Multiplanar CT image reconstructions and MIPs were obtained to evaluate the vascular anatomy. CONTRAST:  100 mL of Isovue 370 IV contrast COMPARISON:  Chest radiograph performed 03/01/2016 FINDINGS: There is no evidence of pulmonary embolus. Mild bibasilar atelectasis noted. Mild bilateral emphysematous change is seen, with scattered blebs. There is no evidence of significant focal consolidation, pleural effusion or pneumothorax. No masses are identified; no abnormal focal contrast enhancement is seen. Scattered coronary artery calcifications are seen. Visualized mediastinal nodes remain normal in size. No pericardial effusion is identified. The great vessels are grossly unremarkable. No axillary lymphadenopathy is seen. The visualized portions of the thyroid gland are unremarkable in appearance. The visualized portions of the liver and spleen are unremarkable. The visualized portions of the pancreas, gallbladder, stomach, adrenal glands and kidneys are within normal limits. No acute osseous abnormalities are seen. There is a chronic fracture through the superior aspect of the body of the sternum, with associated sclerotic change. Review of the MIP images confirms the above findings. IMPRESSION: 1. No evidence of pulmonary embolus. 2. Mild bibasilar atelectasis noted. Mild bilateral emphysema noted, with scattered blebs. 3. Scattered coronary artery calcifications seen. 4. Chronic fracture through the superior aspect of the body of the sternum, with associated sclerotic change. Electronically Signed   By: Garald Balding M.D.   On: 03/02/2016 01:10   I have personally reviewed and evaluated these images and lab results as part of my medical decision-making.   EKG Interpretation None      MDM   Final diagnoses:  Chronic pain    Chronic pain, last  documented long-term refill, 12/29/2015. Information from [facility] Controlled substance reporting system databank. He was here overnight, had a comprehensive evaluation. There is no indication for further evaluation at this time.   Nursing Notes Reviewed/ Care Coordinated Applicable Imaging Reviewed Interpretation of Laboratory Data incorporated into ED treatment  The patient appears reasonably screened and/or stabilized for discharge and I doubt any other medical condition or other [facility] requiring further screening, evaluation, or treatment in the ED at this time prior to discharge.  Plan: Home Medications- usual; Home Treatments- rest; return here if the recommended treatment, does not improve the symptoms; Recommended follow up- PCP prn   Daleen Bo, MD 03/02/16 1630

## 2016-03-04 ENCOUNTER — Emergency Department (HOSPITAL_COMMUNITY)
Admission: EM | Admit: 2016-03-04 | Discharge: 2016-03-04 | Disposition: A | Discharge status: Home / Self Care | Payer: Medicaid Other | Attending: Emergency Medicine | Admitting: Emergency Medicine

## 2016-03-04 DIAGNOSIS — E039 Hypothyroidism, unspecified: Secondary | ICD-10-CM | POA: Diagnosis not present

## 2016-03-04 DIAGNOSIS — J449 Chronic obstructive pulmonary disease, unspecified: Secondary | ICD-10-CM | POA: Diagnosis not present

## 2016-03-04 DIAGNOSIS — G8929 Other chronic pain: Secondary | ICD-10-CM | POA: Insufficient documentation

## 2016-03-04 DIAGNOSIS — Z7982 Long term (current) use of aspirin: Secondary | ICD-10-CM | POA: Diagnosis not present

## 2016-03-04 DIAGNOSIS — Z8673 Personal history of transient ischemic attack (TIA), and cerebral infarction without residual deficits: Secondary | ICD-10-CM | POA: Insufficient documentation

## 2016-03-04 DIAGNOSIS — R079 Chest pain, unspecified: Secondary | ICD-10-CM | POA: Diagnosis not present

## 2016-03-04 DIAGNOSIS — Z85118 Personal history of other malignant neoplasm of bronchus and lung: Secondary | ICD-10-CM | POA: Insufficient documentation

## 2016-03-04 DIAGNOSIS — R109 Unspecified abdominal pain: Secondary | ICD-10-CM

## 2016-03-04 DIAGNOSIS — Z7901 Long term (current) use of anticoagulants: Secondary | ICD-10-CM | POA: Diagnosis not present

## 2016-03-04 DIAGNOSIS — I10 Essential (primary) hypertension: Secondary | ICD-10-CM | POA: Insufficient documentation

## 2016-03-04 DIAGNOSIS — I252 Old myocardial infarction: Secondary | ICD-10-CM | POA: Insufficient documentation

## 2016-03-04 DIAGNOSIS — Z79891 Long term (current) use of opiate analgesic: Secondary | ICD-10-CM | POA: Insufficient documentation

## 2016-03-04 DIAGNOSIS — Z86718 Personal history of other venous thrombosis and embolism: Secondary | ICD-10-CM | POA: Diagnosis not present

## 2016-03-04 DIAGNOSIS — R1013 Epigastric pain: Secondary | ICD-10-CM | POA: Diagnosis not present

## 2016-03-04 DIAGNOSIS — R1011 Right upper quadrant pain: Secondary | ICD-10-CM | POA: Diagnosis present

## 2016-03-04 DIAGNOSIS — Z79899 Other long term (current) drug therapy: Secondary | ICD-10-CM | POA: Diagnosis not present

## 2016-03-04 DIAGNOSIS — F17210 Nicotine dependence, cigarettes, uncomplicated: Secondary | ICD-10-CM | POA: Insufficient documentation

## 2016-03-04 DIAGNOSIS — Z8505 Personal history of malignant neoplasm of liver: Secondary | ICD-10-CM | POA: Diagnosis not present

## 2016-03-04 LAB — COMPREHENSIVE METABOLIC PANEL
ALT: 15 U/L — AB (ref 17–63)
ANION GAP: 7 (ref 5–15)
AST: 25 U/L (ref 15–41)
Albumin: 3.4 g/dL — ABNORMAL LOW (ref 3.5–5.0)
Alkaline Phosphatase: 70 U/L (ref 38–126)
BILIRUBIN TOTAL: 0.2 mg/dL — AB (ref 0.3–1.2)
BUN: 11 mg/dL (ref 6–20)
CO2: 24 mmol/L (ref 22–32)
Calcium: 9.4 mg/dL (ref 8.9–10.3)
Chloride: 109 mmol/L (ref 101–111)
Creatinine, Ser: 0.95 mg/dL (ref 0.61–1.24)
GFR calc non Af Amer: >60 mL/min (ref >60)
Glucose, Bld: 111 mg/dL — ABNORMAL HIGH (ref 65–99)
Potassium: 4.2 mmol/L (ref 3.5–5.1)
Sodium: 140 mmol/L (ref 135–145)
TOTAL PROTEIN: 6.1 g/dL — AB (ref 6.5–8.1)

## 2016-03-04 LAB — CBC WITH DIFFERENTIAL/PLATELET
Basophils Absolute: 0 10*3/uL (ref 0.0–0.1)
Basophils Relative: 0 %
EOS ABS: 0 10*3/uL (ref 0.0–0.7)
Eosinophils Relative: 0 %
HCT: 35.5 % — ABNORMAL LOW (ref 39.0–52.0)
Hemoglobin: 11.1 g/dL — ABNORMAL LOW (ref 13.0–17.0)
LYMPHS ABS: 1.5 10*3/uL (ref 0.7–4.0)
LYMPHS PCT: 17 %
MCH: 28.2 pg (ref 26.0–34.0)
MCHC: 31.3 g/dL (ref 30.0–36.0)
MCV: 90.1 fL (ref 78.0–100.0)
MONO ABS: 0.7 10*3/uL (ref 0.1–1.0)
MONOS PCT: 8 %
Neutro Abs: 6.8 10*3/uL (ref 1.7–7.7)
Neutrophils Relative %: 75 %
Platelets: 372 10*3/uL (ref 150–400)
RBC: 3.94 MIL/uL — AB (ref 4.22–5.81)
RDW: 17.9 % — ABNORMAL HIGH (ref 11.5–15.5)
WBC: 9 10*3/uL (ref 4.0–10.5)

## 2016-03-04 LAB — LIPASE, BLOOD: Lipase: 38 U/L (ref 11–51)

## 2016-03-04 MED ORDER — ONDANSETRON HCL 4 MG/2ML IJ SOLN
4.0000 mg | Freq: Once | INTRAMUSCULAR | Status: DC
  Filled 2016-03-04: qty 2

## 2016-03-04 MED ORDER — OXYCODONE HCL 5 MG PO TABS
10.0000 mg | ORAL_TABLET | Freq: Once | ORAL | Status: AC
  Administered 2016-03-04: 10 mg via ORAL
  Filled 2016-03-04: qty 2

## 2016-03-04 MED ORDER — IBUPROFEN 800 MG PO TABS
800.0000 mg | ORAL_TABLET | Freq: Once | ORAL | Status: AC
  Administered 2016-03-04: 800 mg via ORAL
  Filled 2016-03-04: qty 1

## 2016-03-04 MED ORDER — GI COCKTAIL ~~LOC~~
30.0000 mL | Freq: Once | ORAL | Status: AC
  Administered 2016-03-04: 30 mL via ORAL
  Filled 2016-03-04: qty 30

## 2016-03-04 MED ORDER — CLONIDINE HCL 0.1 MG PO TABS
0.1000 mg | ORAL_TABLET | Freq: Once | ORAL | Status: AC
  Administered 2016-03-04: 0.1 mg via ORAL
  Filled 2016-03-04: qty 1

## 2016-03-04 MED ORDER — ACETAMINOPHEN 500 MG PO TABS
1000.0000 mg | ORAL_TABLET | Freq: Once | ORAL | Status: AC
  Administered 2016-03-04: 1000 mg via ORAL
  Filled 2016-03-04: qty 2

## 2016-03-04 MED ORDER — ONDANSETRON 4 MG PO TBDP
4.0000 mg | ORAL_TABLET | Freq: Once | ORAL | Status: AC
  Administered 2016-03-04: 4 mg via ORAL
  Filled 2016-03-04: qty 1

## 2016-03-04 MED ORDER — DIPHENOXYLATE-ATROPINE 2.5-0.025 MG PO TABS
1.0000 | ORAL_TABLET | Freq: Once | ORAL | Status: AC
Start: 2016-03-04 — End: 2016-03-04
  Administered 2016-03-04: 1 via ORAL
  Filled 2016-03-04: qty 1

## 2016-03-04 NOTE — ED Provider Notes (Signed)
CSN: [id number]     Arrival date & time 03/04/16  0901 History   First MD Initiated Contact with Patient 03/04/16 0919     Chief Complaint  Patient presents with  . Chest Pain     (Consider location/radiation/quality/duration/timing/severity/associated sxs/prior Treatment) Patient is a 55 y.o. male presenting with chest pain. The history is provided by the patient.  Chest Pain Pain location:  Epigastric Pain quality: sharp and shooting   Pain radiates to:  Does not radiate Pain radiates to the back: no   Pain severity:  Moderate Onset quality:  Sudden Duration:  2 days Timing:  Constant Progression:  Worsening Chronicity:  New Relieved by:  Nothing Worsened by:  Nothing tried Ineffective treatments:  None tried Associated symptoms: no abdominal pain, no fever, no headache, no nausea, no palpitations, no shortness of breath and not vomiting    55 yo M With a chief complaints of right upper quadrant abdominal pain. Patient states his pain is been going on for quite some time. He takes chronic narcotics for this. Has been unable to get in with his family physician for the past couple days. This is third visit to the ED in the past 2 days. On his first visit he had a CT scan of his chest that was negative for PE as well as no signs of recurrent liver cancer. There is nothing wrong with his liver or gallbladder. Patient denies any fevers or chills. States that he just needs his pain medicine.  Past Medical History  Diagnosis Date  . Deep vein thrombosis (HCC)     "several"  . Pulmonary emboli (HCC)     "several"  . COPD (chronic obstructive pulmonary disease) ([place])   . Partial small bowel obstruction ([place])   . Myocardial infarction ([place]) 2007; ~ 2011  . Chronic liver failure ([place])   . Shortness of breath     "all the time"  . Oxygen dependent     3L; 24/7" (05/27/2014)  . History of blood transfusion 2013    "related to kidneys shutting down"  . Hepatitis C   . Depression   .  Hypertension   . Hypothyroidism   . Asthma   . Pneumonia     "several times"  . GERD (gastroesophageal reflux disease)   . Migraines     "2-3/day" (05/27/2014)  . Stroke [facility]) ~ 03/2012    "couldn't use my left hand for ~ 6 months" (05/27/2014)  . Stroke [facility]) 05/25/2014    "not able to use my left hand again" (05/27/2014)  . Arthritis     "I'm eat up w/it"  . Chronic lower back pain   . Anxiety   . Intermittent self-catheterization of bladder ([place])   . Liver cancer ([place])   . Metastatic lung cancer     "left"  . B12 deficiency     "give myself shots"   Past Surgical History  Procedure Laterality Date  . Transurethral resection of prostate  2012; 2014  . Tonsillectomy and adenoidectomy  1974  . Knee surgery Left ~ 1972    "cut top of my kneecap off"  . Multiple tooth extractions  11/2010    "26"  . Incision and drainage abscess Left 05/28/2014    Procedure: INCISION AND DRAINAGE ABSCESS LEFT WRIST;  Surgeon: Leanora Cover, MD;  Location: [facility];  Service: Orthopedics;  Laterality: Left;   Family History  Problem Relation Age of Onset  . Cancer Mother   . Cancer Paternal Aunt   .  Cancer Maternal Aunt    Social History  Substance Use Topics  . Smoking status: Current Every Day Smoker -- 0.50 packs/day for 41 years    Types: Cigarettes  . Smokeless tobacco: Former Systems developer  . Alcohol Use: Yes     Comment: 8 drinks a week    Review of Systems  Constitutional: Negative for fever and chills.  HENT: Negative for congestion and facial swelling.   Eyes: Negative for discharge and visual disturbance.  Respiratory: Negative for shortness of breath.   Cardiovascular: Negative for chest pain and palpitations.  Gastrointestinal: Negative for nausea, vomiting, abdominal pain and diarrhea.  Musculoskeletal: Negative for myalgias and arthralgias.  Skin: Negative for color change and rash.  Neurological: Negative for tremors, syncope and headaches.  Psychiatric/Behavioral: Negative for  confusion and dysphoric mood.      Allergies  Penicillins and Morphine and related  Home Medications   Prior to Admission medications   Medication Sig Start Date End Date Taking? Authorizing Provider  albuterol (PROVENTIL HFA;VENTOLIN HFA) 108 (90 BASE) MCG/ACT inhaler Inhale 1-2 puffs into the lungs every 6 (six) hours as needed for wheezing or shortness of breath. 09/20/14  Yes Quintella Reichert, MD  Aspirin-Salicylamide-Caffeine [facility] HEADACHE POWDER PO) Take 1 packet by mouth daily as needed (pain).    Historical Provider, MD  atenolol-chlorthalidone (TENORETIC) 50-25 MG per tablet Take 1 tablet by mouth daily.    Historical Provider, MD  azithromycin (ZITHROMAX Z-PAK) 250 MG tablet Take two tablets by mouth on day one and one tablet by mouth daily on days 2-5 09/20/14   Quintella Reichert, MD  butalbital-acetaminophen-caffeine (ESGIC PLUS) 50-500-40 MG per tablet Take 1 tablet by mouth every 4 (four) hours as needed (migraines).     Historical Provider, MD  cyanocobalamin (,VITAMIN B-12,) 1000 MCG/ML injection Inject 1,000 mcg into the muscle once a week. On monday    Historical Provider, MD  dextromethorphan-guaiFENesin [facility] DM) 30-600 MG per 12 hr tablet Take 1 tablet by mouth 2 (two) times daily as needed for cough (cough).    Historical Provider, MD  diazepam (VALIUM) 10 MG tablet Take 1 tablet (10 mg total) by mouth daily. 06/07/14   Veryl Speak, MD  doxycycline (VIBRAMYCIN) 100 MG capsule Take 1 capsule (100 mg total) by mouth 2 (two) times daily. 02/25/15   Blanchie Dessert, MD  DULoxetine (CYMBALTA) 60 MG capsule Take 1 capsule (60 mg total) by mouth 2 (two) times daily. 06/07/14   Veryl Speak, MD  gabapentin (NEURONTIN) 400 MG capsule Take 1 capsule (400 mg total) by mouth 3 (three) times daily. Patient taking differently: Take 2,000 mg by mouth 3 (three) times daily.  06/07/14   Veryl Speak, MD  Ipratropium-Albuterol (COMBIVENT RESPIMAT) 20-100 MCG/ACT AERS respimat Inhale 1 puff into  the lungs daily.    Historical Provider, MD  levothyroxine (SYNTHROID, LEVOTHROID) 50 MCG tablet Take 1 tablet (50 mcg total) by mouth daily before breakfast. 06/07/14   Veryl Speak, MD  lidocaine (LIDODERM) 5 % Place 1 patch onto the skin daily. Remove & Discard patch within 12 hours or as directed by MD Patient not taking: Reported on 02/22/2015 10/13/14   Orpah Greek, MD  Multiple Vitamins-Minerals (MULTIVITAMIN WITH MINERALS) tablet Take 1 tablet by mouth daily.    Historical Provider, MD  naproxen sodium (ANAPROX) 220 MG tablet Take 660 mg by mouth daily as needed (pain).    Historical Provider, MD  omeprazole (PRILOSEC) 20 MG capsule Take 1 capsule (20 mg total) by mouth  2 (two) times daily. Patient taking differently: Take 20 mg by mouth daily as needed (heartburn).  06/07/14   Veryl Speak, MD  oxyCODONE (ROXICODONE) 5 MG immediate release tablet Take 1 tablet (5 mg total) by mouth 2 (two) times daily as needed for severe pain. 03/02/16   Everlene Balls, MD  oxyCODONE-acetaminophen (PERCOCET/ROXICET) 5-325 MG per tablet Take 1 tablet by mouth every 6 (six) hours as needed for moderate pain or severe pain. 09/20/14   Quintella Reichert, MD  Potassium 99 MG TABS Take 1 tablet by mouth daily.    Historical Provider, MD  potassium chloride (K-DUR) 10 MEQ tablet Take 2 tablets (20 mEq total) by mouth 2 (two) times daily. 02/22/15   Charlesetta Shanks, MD  potassium chloride SA (K-DUR,KLOR-CON) 20 MEQ tablet Take 1 tablet (20 mEq total) by mouth at bedtime. Patient not taking: Reported on 02/22/2015 06/07/14   Veryl Speak, MD  predniSONE (DELTASONE) 10 MG tablet Take 4 tablets (40 mg total) by mouth daily. Patient taking differently: Take 10 mg by mouth 2 (two) times daily with a meal.  09/20/14   Quintella Reichert, MD  promethazine (PHENERGAN) 25 MG tablet Take 1 tablet (25 mg total) by mouth 2 (two) times daily. Patient taking differently: Take 25 mg by mouth 2 (two) times daily as needed for nausea or  vomiting (nausea and vomiting).  06/07/14   Veryl Speak, MD  Rivaroxaban (XARELTO) 15 MG TABS tablet Take 15 mg by mouth daily.     Historical Provider, MD  tamsulosin (FLOMAX) 0.4 MG CAPS capsule Take 1 capsule (0.4 mg total) by mouth at bedtime. Patient taking differently: Take 0.8 mg by mouth daily.  06/07/14   Veryl Speak, MD   BP 183/128 mmHg  Pulse 77  Temp(Src) 97.7 F (36.5 C) (Oral)  Resp 24  SpO2 97% Physical Exam  Constitutional: He is oriented to person, place, and time. He appears well-developed and well-nourished.  HENT:  Head: Normocephalic and atraumatic.  Eyes: EOM are normal. Pupils are equal, round, and reactive to light.  Neck: Normal range of motion. Neck supple. No JVD present.  Cardiovascular: Normal rate and regular rhythm.  Exam reveals no gallop and no friction rub.   No murmur heard. Pulmonary/Chest: No respiratory distress. He has no wheezes.  Abdominal: He exhibits no distension. There is tenderness (diffuse abdominal pain). There is no rebound and no guarding.  Musculoskeletal: Normal range of motion. He exhibits no tenderness.  Neurological: He is alert and oriented to person, place, and time.  Skin: No rash noted. No pallor.  Psychiatric: He has a normal mood and affect. His behavior is normal.  Nursing note and vitals reviewed.   ED Course  Procedures (including critical care time) Labs Review Labs Reviewed  CBC WITH DIFFERENTIAL/PLATELET - Abnormal; Notable for the following:    RBC 3.94 (*)    Hemoglobin 11.1 (*)    HCT 35.5 (*)    RDW 17.9 (*)    All other components within normal limits  COMPREHENSIVE METABOLIC PANEL - Abnormal; Notable for the following:    Glucose, Bld 111 (*)    Total Protein 6.1 (*)    Albumin 3.4 (*)    ALT 15 (*)    Total Bilirubin 0.2 (*)    All other components within normal limits  LIPASE, BLOOD    Imaging Review No results found. I have personally reviewed and evaluated these images and lab results as part  of my medical decision-making.   EKG Interpretation  Date/Time:  Sunday March 04 2016 09:09:04 EDT Ventricular Rate:  77 PR Interval:  126 QRS Duration: 88 QT Interval:  394 QTC Calculation: 445 R Axis:   69 Text Interpretation:  Normal sinus rhythm Normal ECG diffuse flipped t  waves now resolved Otherwise no significant change Confirmed by Travone Georg MD,  DANIEL ([id number]) on 03/04/2016 9:50:42 AM      MDM   Final diagnoses:  Chronically on opiate therapy  Chronic abdominal pain  Chronic chest pain    55 yo M with a chief complaints of abdominal pain. This appears to be a chronic issue. Patient has had multiple visits where there is some concern for malingering. Will treat the patient with nonnarcotic modalities. Laboratory evaluation to evaluate for change of his chronic intra-abdominal pain. Feel no need for imaging as this was just acquired 2 days ago.  No significant findings on laboratory evaluation. Patient able to tolerate by mouth without difficulty. Will have him follow-up with his primary care provider.  11:24 AM:  I have discussed the diagnosis/risks/treatment options with the patient and believe the pt to be eligible for discharge home to follow-up with PCP. We also discussed returning to the ED immediately if new or worsening sx occur. We discussed the sx which are most concerning (e.g., sudden worsening pain, fever, inability to tolerate by mouth) that necessitate immediate return. Medications administered to the patient during their visit and any new prescriptions provided to the patient are listed below.  Medications given during this visit Medications  acetaminophen (TYLENOL) tablet 1,000 mg (1,000 mg Oral Given 03/04/16 1015)  ibuprofen (ADVIL,MOTRIN) tablet 800 mg (800 mg Oral Given 03/04/16 1015)  gi cocktail (Maalox,Lidocaine,Donnatal) (30 mLs Oral Given 03/04/16 1015)  cloNIDine (CATAPRES) tablet 0.1 mg (0.1 mg Oral Given 03/04/16 1017)  diphenoxylate-atropine (LOMOTIL)  2.5-0.025 MG per tablet 1 tablet (1 tablet Oral Given 03/04/16 1015)  ondansetron (ZOFRAN-ODT) disintegrating tablet 4 mg (4 mg Oral Given 03/04/16 1113)  oxyCODONE (Oxy IR/ROXICODONE) immediate release tablet 10 mg (10 mg Oral Given 03/04/16 1113)    New Prescriptions   No medications on file    The patient appears reasonably screen and/or stabilized for discharge and I doubt any other medical condition or other [facility] requiring further screening, evaluation, or treatment in the ED at this time prior to discharge.    Deno Etienne, DO 03/04/16 1124

## 2016-03-04 NOTE — Discharge Instructions (Signed)

## 2016-03-04 NOTE — ED Notes (Signed)
Declined W/C at D/C and was escorted to lobby by RN. 

## 2016-03-04 NOTE — ED Notes (Signed)
Patient here initially complaining of chest pain. On assessment complains of more generalized pain with radiation to right side of abdomen. States that he was told that his cancer has returned with liver involvement. Appears sleepy with slurred speech

## 2016-10-01 ENCOUNTER — Emergency Department (HOSPITAL_COMMUNITY)
Admission: EM | Admit: 2016-10-01 | Discharge: 2016-10-02 | Disposition: A | Discharge status: Home / Self Care | Payer: Medicaid Other | Attending: Emergency Medicine | Admitting: Emergency Medicine

## 2016-10-01 ENCOUNTER — Emergency Department (HOSPITAL_COMMUNITY): Payer: Medicaid Other

## 2016-10-01 ENCOUNTER — Encounter (HOSPITAL_COMMUNITY): Payer: Self-pay | Admitting: Emergency Medicine

## 2016-10-01 DIAGNOSIS — Z7982 Long term (current) use of aspirin: Secondary | ICD-10-CM | POA: Insufficient documentation

## 2016-10-01 DIAGNOSIS — I252 Old myocardial infarction: Secondary | ICD-10-CM | POA: Insufficient documentation

## 2016-10-01 DIAGNOSIS — Z7901 Long term (current) use of anticoagulants: Secondary | ICD-10-CM | POA: Insufficient documentation

## 2016-10-01 DIAGNOSIS — S20212A Contusion of left front wall of thorax, initial encounter: Secondary | ICD-10-CM | POA: Diagnosis not present

## 2016-10-01 DIAGNOSIS — S62339A Displaced fracture of neck of unspecified metacarpal bone, initial encounter for closed fracture: Secondary | ICD-10-CM

## 2016-10-01 DIAGNOSIS — Y9389 Activity, other specified: Secondary | ICD-10-CM | POA: Diagnosis not present

## 2016-10-01 DIAGNOSIS — Z8673 Personal history of transient ischemic attack (TIA), and cerebral infarction without residual deficits: Secondary | ICD-10-CM | POA: Insufficient documentation

## 2016-10-01 DIAGNOSIS — F17210 Nicotine dependence, cigarettes, uncomplicated: Secondary | ICD-10-CM | POA: Insufficient documentation

## 2016-10-01 DIAGNOSIS — Y9289 Other specified places as the place of occurrence of the external cause: Secondary | ICD-10-CM | POA: Insufficient documentation

## 2016-10-01 DIAGNOSIS — Y999 Unspecified external cause status: Secondary | ICD-10-CM | POA: Insufficient documentation

## 2016-10-01 DIAGNOSIS — Z85118 Personal history of other malignant neoplasm of bronchus and lung: Secondary | ICD-10-CM | POA: Insufficient documentation

## 2016-10-01 DIAGNOSIS — I10 Essential (primary) hypertension: Secondary | ICD-10-CM | POA: Diagnosis not present

## 2016-10-01 DIAGNOSIS — J449 Chronic obstructive pulmonary disease, unspecified: Secondary | ICD-10-CM | POA: Diagnosis not present

## 2016-10-01 DIAGNOSIS — W109XXA Fall (on) (from) unspecified stairs and steps, initial encounter: Secondary | ICD-10-CM | POA: Diagnosis not present

## 2016-10-01 DIAGNOSIS — S6291XA Unspecified fracture of right wrist and hand, initial encounter for closed fracture: Secondary | ICD-10-CM | POA: Diagnosis present

## 2016-10-01 DIAGNOSIS — E039 Hypothyroidism, unspecified: Secondary | ICD-10-CM | POA: Diagnosis not present

## 2016-10-01 DIAGNOSIS — Z8505 Personal history of malignant neoplasm of liver: Secondary | ICD-10-CM | POA: Diagnosis not present

## 2016-10-01 DIAGNOSIS — S62316A Displaced fracture of base of fifth metacarpal bone, right hand, initial encounter for closed fracture: Secondary | ICD-10-CM | POA: Diagnosis not present

## 2016-10-01 DIAGNOSIS — W19XXXA Unspecified fall, initial encounter: Secondary | ICD-10-CM

## 2016-10-01 LAB — CBC
HCT: 37.2 % — ABNORMAL LOW (ref 39.0–52.0)
Hemoglobin: 12.3 g/dL — ABNORMAL LOW (ref 13.0–17.0)
MCH: 29.2 pg (ref 26.0–34.0)
MCHC: 33.1 g/dL (ref 30.0–36.0)
MCV: 88.4 fL (ref 78.0–100.0)
PLATELETS: 311 10*3/uL (ref 150–400)
RBC: 4.21 MIL/uL — ABNORMAL LOW (ref 4.22–5.81)
RDW: 15.9 % — AB (ref 11.5–15.5)
WBC: 8.6 10*3/uL (ref 4.0–10.5)

## 2016-10-01 LAB — BASIC METABOLIC PANEL
Anion gap: 14 (ref 5–15)
BUN: 7 mg/dL (ref 6–20)
CALCIUM: 9.6 mg/dL (ref 8.9–10.3)
CO2: 25 mmol/L (ref 22–32)
CREATININE: 1.07 mg/dL (ref 0.61–1.24)
Chloride: 102 mmol/L (ref 101–111)
GFR calc Af Amer: >60 mL/min (ref >60)
GLUCOSE: 113 mg/dL — AB (ref 65–99)
Potassium: 3.8 mmol/L (ref 3.5–5.1)
Sodium: 141 mmol/L (ref 135–145)

## 2016-10-01 MED ORDER — ALBUTEROL SULFATE (2.5 MG/3ML) 0.083% IN NEBU
5.0000 mg | INHALATION_SOLUTION | Freq: Once | RESPIRATORY_TRACT | Status: DC
Start: 2016-10-01 — End: 2016-10-02
  Filled 2016-10-01: qty 6

## 2016-10-01 NOTE — ED Notes (Signed)
ED Provider at bedside. 

## 2016-10-01 NOTE — ED Notes (Signed)
Patient transported to CT 

## 2016-10-01 NOTE — ED Provider Notes (Signed)
[facility] DEPT Provider Note   CSN: [id number] Arrival date & time: 10/01/16  2119     History   Chief Complaint Chief Complaint  Patient presents with  . Fall  . Shortness of Breath    HPI Robert [name] is a 56 y.o. male.  HPI   Laden Fieldhouse is a 56 y.o. male, with a history of COPD, asthma, and PE, presenting to the ED with Chest tenderness and shortness of breath following a fall that occurred 4 days ago. Patient states that he fell down 18 steps, striking his chest multiple times. Patient states he has had multiple rib fractures in the past and his current pain and shortness of breath are far worse. Pain is located in the left chest and retrosternal. Also complains of left foot and right hand pain.   Denies cough, hemoptysis, fever/chills, acute neck/back pain, neuro deficits, LOC, or any other complaints.      Past Medical History:  Diagnosis Date  . Anxiety   . Arthritis    "I'm eat up w/it"  . Asthma   . B12 deficiency    "give myself shots"  . Chronic liver failure ([place])   . Chronic lower back pain   . COPD (chronic obstructive pulmonary disease) ([place])   . Deep vein thrombosis (HCC)    "several"  . Depression   . GERD (gastroesophageal reflux disease)   . Hepatitis C   . History of blood transfusion 2013   "related to kidneys shutting down"  . Hypertension   . Hypothyroidism   . Intermittent self-catheterization of bladder   . Liver cancer ([place])   . Metastatic lung cancer    "left"  . Migraines    "2-3/day" (05/27/2014)  . Myocardial infarction 2007; ~ 2011  . Oxygen dependent    3L; 24/7" (05/27/2014)  . Partial small bowel obstruction   . Pneumonia    "several times"  . Pulmonary emboli (HCC)    "several"  . Shortness of breath    "all the time"  . Stroke [facility]) ~ 03/2012   "couldn't use my left hand for ~ 6 months" (05/27/2014)  . Stroke [facility]) 05/25/2014   "not able to use my left hand again" (05/27/2014)    Patient Active  Problem List   Diagnosis Date Noted  . Disturbance of skin sensation 05/29/2014  . Cellulitis 05/27/2014  . Cellulitis of left hand 05/27/2014  . Chronically on opiate therapy 05/27/2014  . Chronic anticoagulation 05/27/2014  . COPD (chronic obstructive pulmonary disease) ([place]) 04/17/2012  . Leg pain, right 04/17/2012  . Low back pain 04/17/2012  . Hepatitis C 04/17/2012  . Adynamic ileus ([place]) 04/17/2012  . Liver cancer ([place])   . Lung cancer ([place])   . Pulmonary emboli [facility])     Past Surgical History:  Procedure Laterality Date  . INCISION AND DRAINAGE ABSCESS Left 05/28/2014   Procedure: INCISION AND DRAINAGE ABSCESS LEFT WRIST;  Surgeon: Leanora Cover, MD;  Location: [facility];  Service: Orthopedics;  Laterality: Left;  . KNEE SURGERY Left ~ 1972   "cut top of my kneecap off"  . MULTIPLE TOOTH EXTRACTIONS  11/2010   "26"  . TONSILLECTOMY AND ADENOIDECTOMY  1974  . TRANSURETHRAL RESECTION OF PROSTATE  2012; 2014       Home Medications    Prior to Admission medications   Medication Sig Start Date End Date Taking? Authorizing Provider  aspirin EC 81 MG tablet Take 81 mg by mouth every 6 (six)  hours as needed for moderate pain.   Yes Historical Provider, MD  albuterol (PROVENTIL HFA;VENTOLIN HFA) 108 (90 BASE) MCG/ACT inhaler Inhale 1-2 puffs into the lungs every 6 (six) hours as needed for wheezing or shortness of breath. Patient not taking: Reported on 10/01/2016 09/20/14   Quintella Reichert, MD  azithromycin (ZITHROMAX Z-PAK) 250 MG tablet Take two tablets by mouth on day one and one tablet by mouth daily on days 2-5 Patient not taking: Reported on 10/01/2016 09/20/14   Quintella Reichert, MD  diazepam (VALIUM) 10 MG tablet Take 1 tablet (10 mg total) by mouth daily. Patient not taking: Reported on 10/01/2016 06/07/14   Veryl Speak, MD  doxycycline (VIBRAMYCIN) 100 MG capsule Take 1 capsule (100 mg total) by mouth 2 (two) times daily. Patient not taking: Reported on 10/01/2016 02/25/15   Blanchie Dessert, MD  DULoxetine (CYMBALTA) 60 MG capsule Take 1 capsule (60 mg total) by mouth 2 (two) times daily. Patient not taking: Reported on 10/01/2016 06/07/14   Veryl Speak, MD  gabapentin (NEURONTIN) 400 MG capsule Take 1 capsule (400 mg total) by mouth 3 (three) times daily. Patient not taking: Reported on 10/01/2016 06/07/14   Veryl Speak, MD  levothyroxine (SYNTHROID, LEVOTHROID) 50 MCG tablet Take 1 tablet (50 mcg total) by mouth daily before breakfast. Patient not taking: Reported on 10/01/2016 06/07/14   Veryl Speak, MD  lidocaine (LIDODERM) 5 % Place 1 patch onto the skin daily. Remove & Discard patch within 12 hours or as directed by MD Patient not taking: Reported on 10/01/2016 10/13/14   Orpah Greek, MD  omeprazole (PRILOSEC) 20 MG capsule Take 1 capsule (20 mg total) by mouth 2 (two) times daily. Patient not taking: Reported on 10/01/2016 06/07/14   Veryl Speak, MD  oxyCODONE (ROXICODONE) 5 MG immediate release tablet Take 1 tablet (5 mg total) by mouth every 6 (six) hours as needed for severe pain. 10/02/16   Cleone Hulick C Elzia Hott, PA-C  oxyCODONE-acetaminophen (PERCOCET/ROXICET) 5-325 MG per tablet Take 1 tablet by mouth every 6 (six) hours as needed for moderate pain or severe pain. Patient not taking: Reported on 10/01/2016 09/20/14   Quintella Reichert, MD  potassium chloride (K-DUR) 10 MEQ tablet Take 2 tablets (20 mEq total) by mouth 2 (two) times daily. Patient not taking: Reported on 10/01/2016 02/22/15   Charlesetta Shanks, MD  potassium chloride SA (K-DUR,KLOR-CON) 20 MEQ tablet Take 1 tablet (20 mEq total) by mouth at bedtime. Patient not taking: Reported on 10/01/2016 06/07/14   Veryl Speak, MD  predniSONE (DELTASONE) 10 MG tablet Take 4 tablets (40 mg total) by mouth daily. Patient not taking: Reported on 10/01/2016 09/20/14   Quintella Reichert, MD  promethazine (PHENERGAN) 25 MG tablet Take 1 tablet (25 mg total) by mouth 2 (two) times daily. Patient not taking: Reported on 10/01/2016 06/07/14   Veryl Speak, MD  tamsulosin (FLOMAX) 0.4 MG CAPS capsule Take 1 capsule (0.4 mg total) by mouth at bedtime. Patient not taking: Reported on 10/01/2016 06/07/14   Veryl Speak, MD    Family History Family History  Problem Relation Age of Onset  . Cancer Mother   . Cancer Paternal Aunt   . Cancer Maternal Aunt     Social History Social History  Substance Use Topics  . Smoking status: Current Every Day Smoker    Packs/day: 0.50    Years: 41.00    Types: Cigarettes  . Smokeless tobacco: Former Systems developer  . Alcohol use Yes     Comment: 8  drinks a week     Allergies   Penicillins and Morphine and related   Review of Systems Review of Systems  Constitutional: Negative for chills and fever.  Respiratory: Positive for shortness of breath. Negative for cough.   Gastrointestinal: Negative for nausea and vomiting.  Musculoskeletal: Negative for back pain and neck pain.       Chest wall tenderness  All other systems reviewed and are negative.    Physical Exam Updated Vital Signs BP 169/96 (BP Location: Right Arm)   Pulse (!) 127   Temp 98.3 F (36.8 C) (Oral)   Resp 18   Ht '5\' 7"'$  (1.702 m)   Wt 72.6 kg   SpO2 100%   BMI 25.06 kg/m   Physical Exam  Constitutional: He appears well-developed and well-nourished. No distress.  HENT:  Head: Normocephalic and atraumatic.  Mouth/Throat: Oropharynx is clear and moist.  Eyes: Conjunctivae and EOM are normal. Pupils are equal, round, and reactive to light.  Neck: Normal range of motion. Neck supple.  Cardiovascular: Regular rhythm, normal heart sounds and intact distal pulses.  Tachycardia present.   Pulmonary/Chest: Effort normal and breath sounds normal. No respiratory distress. He exhibits tenderness.  Tenderness left ribs adjacent to the sternum. Exquisite tenderness to the sternum. No noted deformity, crepitus, or instability. Patient speaks in full sentences. No noted increased work of breathing.  Abdominal: Soft. There is no  tenderness. There is no guarding.  Musculoskeletal: He exhibits edema, tenderness and deformity.  Normal motor function intact in all extremities and spine. No midline spinal tenderness.  Tenderness and bruising to the left lateral foot. Range of motion in the foot and ankle fully intact. Tenderness and swelling to the right hand. Deformity over the dorsal fifth metacarpal. Patient can spread and close the fingers against resistance. No scissoring of the small finger noted. Patient can close his hand to the diameter of a standard shovel handle. Neurovascularly intact.  Neurological: He is alert.  No sensory deficits. Strength 5/5 in all extremities. Coordination intact. Cranial nerves III-XII grossly intact. No facial droop.   Skin: Skin is warm and dry. Capillary refill takes less than 2 seconds. He is not diaphoretic.  Psychiatric: He has a normal mood and affect. His behavior is normal.  Nursing note and vitals reviewed.    ED Treatments / Results  Labs (all labs ordered are listed, but only abnormal results are displayed) Labs Reviewed  BASIC METABOLIC PANEL - Abnormal; Notable for the following:       Result Value   Glucose, Bld 113 (*)    All other components within normal limits  CBC - Abnormal; Notable for the following:    RBC 4.21 (*)    Hemoglobin 12.3 (*)    HCT 37.2 (*)    RDW 15.9 (*)    All other components within normal limits    EKG  EKG Interpretation None       Radiology Dg Chest 2 View  Result Date: 10/01/2016 CLINICAL DATA:  Patient fell last week with left-sided chest pain. EXAM: CHEST  2 VIEW COMPARISON:  CT from 03/02/2016, CXR from 03/01/2016 FINDINGS: There is emphysematous hyperinflation of the lungs without pneumothorax or pulmonary contusions. No effusion is identified. No acute displaced fracture. Heart and mediastinal contours are unremarkable. Smooth extrapleural density in the right upper lobe laterally consistent with a lipoma by CT. Previous  sternal fracture not well characterize radiographically on the lateral view. Small density overlying the lower ribs on the left  may represent the patient's nipple shadow or possibly callus. IMPRESSION: Hyperinflated lungs. Extrapleural density in the right upper lobe consistent with a lipoma by CT. No acute osseous abnormality. Electronically Signed   By: Ashley Royalty M.D.   On: 10/01/2016 22:09   Ct Chest Wo Contrast  Result Date: 10/02/2016 CLINICAL DATA:  Chest pain from fall EXAM: CT CHEST WITHOUT CONTRAST TECHNIQUE: Multidetector CT imaging of the chest was performed following the standard protocol without IV contrast. COMPARISON:  Radiograph 10/01/2016, CT chest 03/02/2016 FINDINGS: Cardiovascular: Limited evaluation without the presence of intravenous contrast. Atherosclerosis of the aorta. No aneurysmal dilatation. Coronary artery calcifications. Normal heart size. No pericardial effusion. Mediastinum/Nodes: Subcentimeter nonspecific lymph nodes unchanged. Trachea and mainstem bronchi appear normal. Thyroid unremarkable. Esophagus within normal limits. Lungs/Pleura: Moderate emphysematous disease within the bilateral lungs. Probable scarring along the left pulmonary fissure. No acute infiltrate effusion or pneumothorax is visualized. There is atelectasis or scar in the right lower lobe. Small extrapleural lipoma right upper lobe. Upper Abdomen: No acute abnormality. Musculoskeletal: There is a subacute fracture of the left posteromedial eighth rib at its articulation with the vertebral body. IMPRESSION: 1. Subacute fracture of the left posteromedial eighth rib. No pneumothorax or pleural effusion. 2. Moderate emphysematous disease.  No acute pulmonary infiltrates. Electronically Signed   By: Donavan Foil M.D.   On: 10/02/2016 00:22   Dg Hand Complete Right  Result Date: 10/02/2016 CLINICAL DATA:  Fall with pain EXAM: RIGHT HAND - COMPLETE 3+ VIEW COMPARISON:  06/16/2015 FINDINGS: There is an acute  fracture involving the distal shaft of the fifth metacarpal with moderate palmar angulation of distal fracture fragment. No subluxation. IMPRESSION: Acute angulated fracture of the distal fifth metacarpal. Electronically Signed   By: Donavan Foil M.D.   On: 10/02/2016 00:25   Dg Foot Complete Left  Result Date: 10/02/2016 CLINICAL DATA:  Fall with lateral foot pain EXAM: LEFT FOOT - COMPLETE 3+ VIEW COMPARISON:  None. FINDINGS: No acute fracture or subluxation is seen. There are vascular calcifications in the soft tissues. Accessory os adjacent to the navicular bone. Mild degenerative changes at the first MTP joint. IMPRESSION: No acute osseous abnormality Electronically Signed   By: Donavan Foil M.D.   On: 10/02/2016 00:24    Procedures Procedures (including critical care time)  Medications Ordered in ED Medications  albuterol (PROVENTIL) (2.5 MG/3ML) 0.083% nebulizer solution 5 mg (0 mg Nebulization Hold 10/01/16 2336)  oxyCODONE (Oxy IR/ROXICODONE) immediate release tablet 10 mg (10 mg Oral Given 10/02/16 0044)     Initial Impression / Assessment and Plan / ED Course  I have reviewed the triage vital signs and the nursing notes.  Pertinent labs & imaging results that were available during my care of the patient were reviewed by me and considered in my medical decision making (see chart for details).  Clinical Course     Patient presents with injuries from a fall 4 days ago. Age-related boxer's fracture on x-ray. No other acute abnormalities noted. Chest CT obtained due to suspicion of further injury that may have been occult on x-ray. No acute abnormalities on chest CT. Suspect patient's tachycardia is due to pain.   1:04 AM Spoke with Dr. Grandville Silos, hand surgeon, who asked [place] to splint the hand as is and have the patient follow up in the office. Pt is to be informed that Dr. Biagio Borg office will call him tomorrow with more information.   Findings and plan of care discussed with  Delice Bison Ward, DO.  Vitals:   10/01/16 2127 10/01/16 2330 10/02/16 0026 10/02/16 0030  BP: 169/96 (!) 151/106 (!) 142/104 162/94  Pulse: (!) 127 112 100 110  Resp: '18 23 19 21  '$ Temp: 98.3 F (36.8 C)     TempSrc: Oral     SpO2: 100% 100% 99% 99%  Weight: 72.6 kg     Height: '5\' 7"'$  (1.702 m)        Final Clinical Impressions(s) / ED Diagnoses   Final diagnoses:  Fall, initial encounter  Rib contusion, left, initial encounter  Closed boxer's fracture, initial encounter    New Prescriptions New Prescriptions   OXYCODONE (ROXICODONE) 5 MG IMMEDIATE RELEASE TABLET    Take 1 tablet (5 mg total) by mouth every 6 (six) hours as needed for severe pain.     Lorayne Bender, PA-C 10/02/16 [place], DO 10/02/16 0158

## 2016-10-01 NOTE — ED Triage Notes (Signed)
Pt reports fall last week. Pt c/o left sided chest pain with deep breathing from fall. Pt states feels SOB. Denies recent fever. Pt speaking in full sentences. VSS. Lung sounds diminished.

## 2016-10-02 ENCOUNTER — Emergency Department (HOSPITAL_COMMUNITY): Payer: Medicaid Other

## 2016-10-02 MED ORDER — OXYCODONE HCL 5 MG PO TABS
10.0000 mg | ORAL_TABLET | Freq: Once | ORAL | Status: AC
  Administered 2016-10-02: 10 mg via ORAL
  Filled 2016-10-02: qty 2

## 2016-10-02 MED ORDER — OXYCODONE HCL 5 MG PO TABS: 5.0000 mg | ORAL_TABLET | Freq: Four times a day (QID) | ORAL | 0 refills | Status: DC | PRN

## 2016-10-02 NOTE — ED Notes (Signed)
Patient Alert and oriented X4. Stable and ambulatory. Patient verbalized understanding of the discharge instructions.  Patient belongings were taken by the patient.  

## 2016-10-02 NOTE — ED Notes (Signed)
ED Provider at bedside. 

## 2016-10-02 NOTE — ED Notes (Signed)
Patient transported to X-ray 

## 2016-10-02 NOTE — Discharge Instructions (Signed)
You have what is called a "Boxer's fracture" in your hand. This will require that you follow up with the hand specialist. Dr. Biagio Borg office will contact you tomorrow with further details.  Your other imaging showed no acute abnormalities. Follow up with your primary care provider for continued pain management.

## 2016-10-10 ENCOUNTER — Encounter (HOSPITAL_COMMUNITY): Payer: Self-pay | Admitting: *Deleted

## 2016-10-10 ENCOUNTER — Emergency Department (HOSPITAL_COMMUNITY): Payer: Medicaid Other

## 2016-10-10 ENCOUNTER — Emergency Department (HOSPITAL_COMMUNITY)
Admission: EM | Admit: 2016-10-10 | Discharge: 2016-10-10 | Disposition: A | Discharge status: Home / Self Care | Payer: Medicaid Other | Attending: Emergency Medicine | Admitting: Emergency Medicine

## 2016-10-10 DIAGNOSIS — Z79899 Other long term (current) drug therapy: Secondary | ICD-10-CM | POA: Diagnosis not present

## 2016-10-10 DIAGNOSIS — J449 Chronic obstructive pulmonary disease, unspecified: Secondary | ICD-10-CM | POA: Diagnosis not present

## 2016-10-10 DIAGNOSIS — S2232XD Fracture of one rib, left side, subsequent encounter for fracture with routine healing: Secondary | ICD-10-CM

## 2016-10-10 DIAGNOSIS — E039 Hypothyroidism, unspecified: Secondary | ICD-10-CM | POA: Insufficient documentation

## 2016-10-10 DIAGNOSIS — Z8505 Personal history of malignant neoplasm of liver: Secondary | ICD-10-CM | POA: Diagnosis not present

## 2016-10-10 DIAGNOSIS — R0789 Other chest pain: Secondary | ICD-10-CM | POA: Diagnosis not present

## 2016-10-10 DIAGNOSIS — Z7982 Long term (current) use of aspirin: Secondary | ICD-10-CM | POA: Diagnosis not present

## 2016-10-10 DIAGNOSIS — I10 Essential (primary) hypertension: Secondary | ICD-10-CM | POA: Diagnosis not present

## 2016-10-10 DIAGNOSIS — Z85118 Personal history of other malignant neoplasm of bronchus and lung: Secondary | ICD-10-CM | POA: Insufficient documentation

## 2016-10-10 DIAGNOSIS — I252 Old myocardial infarction: Secondary | ICD-10-CM | POA: Diagnosis not present

## 2016-10-10 DIAGNOSIS — F17210 Nicotine dependence, cigarettes, uncomplicated: Secondary | ICD-10-CM | POA: Diagnosis not present

## 2016-10-10 DIAGNOSIS — Z8673 Personal history of transient ischemic attack (TIA), and cerebral infarction without residual deficits: Secondary | ICD-10-CM | POA: Insufficient documentation

## 2016-10-10 LAB — BASIC METABOLIC PANEL
ANION GAP: 13 (ref 5–15)
BUN: 9 mg/dL (ref 6–20)
CALCIUM: 10 mg/dL (ref 8.9–10.3)
CHLORIDE: 102 mmol/L (ref 101–111)
CO2: 24 mmol/L (ref 22–32)
Creatinine, Ser: 0.88 mg/dL (ref 0.61–1.24)
GFR calc non Af Amer: >60 mL/min (ref >60)
Glucose, Bld: 92 mg/dL (ref 65–99)
Potassium: 4.5 mmol/L (ref 3.5–5.1)
SODIUM: 139 mmol/L (ref 135–145)

## 2016-10-10 LAB — I-STAT TROPONIN, ED
TROPONIN I, POC: 0 ng/mL (ref 0.00–0.08)
TROPONIN I, POC: 0 ng/mL (ref 0.00–0.08)

## 2016-10-10 LAB — CBC
HCT: 34.8 % — ABNORMAL LOW (ref 39.0–52.0)
HEMOGLOBIN: 11.4 g/dL — AB (ref 13.0–17.0)
MCH: 29.4 pg (ref 26.0–34.0)
MCHC: 32.8 g/dL (ref 30.0–36.0)
MCV: 89.7 fL (ref 78.0–100.0)
PLATELETS: 351 10*3/uL (ref 150–400)
RBC: 3.88 MIL/uL — AB (ref 4.22–5.81)
RDW: 16.6 % — ABNORMAL HIGH (ref 11.5–15.5)
WBC: 12.4 10*3/uL — AB (ref 4.0–10.5)

## 2016-10-10 MED ORDER — ACETAMINOPHEN 500 MG PO TABS: 1000.0000 mg | ORAL_TABLET | Freq: Three times a day (TID) | ORAL | 0 refills | Status: AC

## 2016-10-10 MED ORDER — OXYCODONE-ACETAMINOPHEN 5-325 MG PO TABS
1.0000 | ORAL_TABLET | Freq: Once | ORAL | Status: AC
  Administered 2016-10-10: 1 via ORAL
  Filled 2016-10-10: qty 1

## 2016-10-10 MED ORDER — HYDROCODONE-ACETAMINOPHEN 5-325 MG PO TABS: 1.0000 | ORAL_TABLET | Freq: Once | ORAL | Status: DC
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
## 2016-10-10 NOTE — ED Notes (Signed)
Nurse will draw labs. 
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
## 2016-11-06 ENCOUNTER — Emergency Department (HOSPITAL_COMMUNITY): Payer: Medicaid Other

## 2016-11-06 ENCOUNTER — Encounter (HOSPITAL_COMMUNITY): Payer: Self-pay

## 2016-11-06 ENCOUNTER — Other Ambulatory Visit: Payer: Self-pay

## 2016-11-06 DIAGNOSIS — F17210 Nicotine dependence, cigarettes, uncomplicated: Secondary | ICD-10-CM | POA: Insufficient documentation

## 2016-11-06 DIAGNOSIS — J449 Chronic obstructive pulmonary disease, unspecified: Secondary | ICD-10-CM | POA: Diagnosis not present

## 2016-11-06 DIAGNOSIS — Y999 Unspecified external cause status: Secondary | ICD-10-CM | POA: Diagnosis not present

## 2016-11-06 DIAGNOSIS — W108XXA Fall (on) (from) other stairs and steps, initial encounter: Secondary | ICD-10-CM | POA: Insufficient documentation

## 2016-11-06 DIAGNOSIS — Y939 Activity, unspecified: Secondary | ICD-10-CM | POA: Insufficient documentation

## 2016-11-06 DIAGNOSIS — E039 Hypothyroidism, unspecified: Secondary | ICD-10-CM | POA: Diagnosis not present

## 2016-11-06 DIAGNOSIS — R51 Headache: Secondary | ICD-10-CM | POA: Diagnosis not present

## 2016-11-06 DIAGNOSIS — I252 Old myocardial infarction: Secondary | ICD-10-CM | POA: Insufficient documentation

## 2016-11-06 DIAGNOSIS — S92524A Nondisplaced fracture of medial phalanx of right lesser toe(s), initial encounter for closed fracture: Secondary | ICD-10-CM | POA: Diagnosis not present

## 2016-11-06 DIAGNOSIS — Z8673 Personal history of transient ischemic attack (TIA), and cerebral infarction without residual deficits: Secondary | ICD-10-CM | POA: Diagnosis not present

## 2016-11-06 DIAGNOSIS — Z85118 Personal history of other malignant neoplasm of bronchus and lung: Secondary | ICD-10-CM | POA: Diagnosis not present

## 2016-11-06 DIAGNOSIS — Y929 Unspecified place or not applicable: Secondary | ICD-10-CM | POA: Diagnosis not present

## 2016-11-06 DIAGNOSIS — S99921A Unspecified injury of right foot, initial encounter: Secondary | ICD-10-CM | POA: Diagnosis present

## 2016-11-06 DIAGNOSIS — Z7982 Long term (current) use of aspirin: Secondary | ICD-10-CM | POA: Diagnosis not present

## 2016-11-06 DIAGNOSIS — Z8505 Personal history of malignant neoplasm of liver: Secondary | ICD-10-CM | POA: Diagnosis not present

## 2016-11-06 DIAGNOSIS — Z7901 Long term (current) use of anticoagulants: Secondary | ICD-10-CM | POA: Diagnosis not present

## 2016-11-06 LAB — BASIC METABOLIC PANEL
ANION GAP: 13 (ref 5–15)
BUN: 8 mg/dL (ref 6–20)
CO2: 25 mmol/L (ref 22–32)
Calcium: 9.5 mg/dL (ref 8.9–10.3)
Chloride: 101 mmol/L (ref 101–111)
Creatinine, Ser: 0.94 mg/dL (ref 0.61–1.24)
GFR calc non Af Amer: >60 mL/min (ref >60)
Glucose, Bld: 94 mg/dL (ref 65–99)
POTASSIUM: 5 mmol/L (ref 3.5–5.1)
SODIUM: 139 mmol/L (ref 135–145)

## 2016-11-06 LAB — CBC
HCT: 32.8 % — ABNORMAL LOW (ref 39.0–52.0)
HEMOGLOBIN: 10.6 g/dL — AB (ref 13.0–17.0)
MCH: 28.6 pg (ref 26.0–34.0)
MCHC: 32.3 g/dL (ref 30.0–36.0)
MCV: 88.6 fL (ref 78.0–100.0)
Platelets: 471 10*3/uL — ABNORMAL HIGH (ref 150–400)
RBC: 3.7 MIL/uL — AB (ref 4.22–5.81)
RDW: 16.1 % — ABNORMAL HIGH (ref 11.5–15.5)
WBC: 10.2 10*3/uL (ref 4.0–10.5)

## 2016-11-06 LAB — I-STAT TROPONIN, ED: Troponin i, poc: 0 ng/mL (ref 0.00–0.08)

## 2016-11-06 NOTE — ED Triage Notes (Signed)
Pt states that he fell down about 8 stairs today while taking out the trash, c/o R rib pain, R hand and R foot, pt also states his chest pain since he broke his ribs. + LOC, pt on blood thinners

## 2016-11-06 NOTE — ED Notes (Signed)
Pt removed c-collar. Pt informed that he was not suppose to remove the c-collar. Pt stated that he was aware however he took it off to drink his coffee from [employer].

## 2016-11-06 NOTE — ED Notes (Signed)
c-collar applied in triage

## 2016-11-07 ENCOUNTER — Emergency Department (HOSPITAL_COMMUNITY)
Admission: EM | Admit: 2016-11-07 | Discharge: 2016-11-07 | Disposition: A | Discharge status: Home / Self Care | Payer: Medicaid Other | Attending: Emergency Medicine | Admitting: Emergency Medicine

## 2016-11-07 DIAGNOSIS — W19XXXA Unspecified fall, initial encounter: Secondary | ICD-10-CM

## 2016-11-07 DIAGNOSIS — S92504A Nondisplaced unspecified fracture of right lesser toe(s), initial encounter for closed fracture: Secondary | ICD-10-CM

## 2016-11-07 MED ORDER — NAPROXEN 500 MG PO TABS: 500.0000 mg | ORAL_TABLET | Freq: Two times a day (BID) | ORAL | 0 refills | Status: DC

## 2016-11-07 MED ORDER — KETOROLAC TROMETHAMINE 15 MG/ML IJ SOLN
15.0000 mg | Freq: Once | INTRAMUSCULAR | Status: AC
  Administered 2016-11-07: 15 mg via INTRAMUSCULAR
  Filled 2016-11-07: qty 1

## 2016-11-07 MED ORDER — CYCLOBENZAPRINE HCL 10 MG PO TABS
5.0000 mg | ORAL_TABLET | Freq: Once | ORAL | Status: AC
Start: 2016-11-07 — End: 2016-11-07
  Administered 2016-11-07: 5 mg via ORAL
  Filled 2016-11-07: qty 1
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
## 2016-11-07 NOTE — ED Provider Notes (Signed)
[facility] DEPT Provider Note   CSN: [id number] Arrival date & time: 11/06/16  2003  By signing my name below, I, [name], attest that this documentation has been prepared under the direction and in the presence of Merryl Hacker, MD.  Electronically Signed: [name], ED Scribe. 11/07/16. 1:33 AM.    History   Chief Complaint Chief Complaint  Patient presents with  . Fall   The history is provided by the patient. No language interpreter was used.   HPI Comments: Robert [name] is a 56 y.o. male who has a PMhx of COPD, hepatitis C, HTN, and MI presents to the Emergency Department complaining of 8/10, right foot pain and right shoulder pain s/p a fall that occurred 4 days ago. He reports associated generalized chest pain secondary to having 3 hernias. Pt notes that he fell down ~ 8 steps on Saturday (x 4 days ago). He reports loss of consciousness but he did not hit his head. Pt was seen and evaluated for a fall that occurred on 01/01. He had imaging performed which shoes an age-related boxer's fracture on x-ray but otherwise not acute abnormalities on a chest CT. Pt has been having difficulty ambulating since his fall.  Reports multiple mechanical fall.   Pt has not taken any pain medication to alleviate his pain. He has a c-collar in place. He notes drinking a wine cooler but denies any other ETOH use or drug use. Pt does not have shortness of breath or neck pain.  Past Medical History:  Diagnosis Date  . Anxiety   . Arthritis    "I'm eat up w/it"  . Asthma   . B12 deficiency    "give myself shots"  . Chronic liver failure ([place])   . Chronic lower back pain   . COPD (chronic obstructive pulmonary disease) ([employer])   . Deep vein thrombosis (HCC)    "several"  . Depression   . GERD (gastroesophageal reflux disease)   . Hepatitis C   . History of blood transfusion 2013   "related to kidneys shutting down"  . Hypertension   . Hypothyroidism   . Intermittent  self-catheterization of bladder   . Liver cancer ([place])   . Metastatic lung cancer    "left"  . Migraines    "2-3/day" (05/27/2014)  . Myocardial infarction 2007; ~ 2011  . Oxygen dependent    3L; 24/7" (05/27/2014)  . Partial small bowel obstruction   . Pneumonia    "several times"  . Pulmonary emboli (HCC)    "several"  . Shortness of breath    "all the time"  . Stroke [facility]) ~ 03/2012   "couldn't use my left hand for ~ 6 months" (05/27/2014)  . Stroke [facility]) 05/25/2014   "not able to use my left hand again" (05/27/2014)    Patient Active Problem List   Diagnosis Date Noted  . Disturbance of skin sensation 05/29/2014  . Cellulitis 05/27/2014  . Cellulitis of left hand 05/27/2014  . Chronically on opiate therapy 05/27/2014  . Chronic anticoagulation 05/27/2014  . COPD (chronic obstructive pulmonary disease) ([place]) 04/17/2012  . Leg pain, right 04/17/2012  . Low back pain 04/17/2012  . Hepatitis C 04/17/2012  . Adynamic ileus ([place]) 04/17/2012  . Liver cancer ([place])   . Lung cancer ([place])   . Pulmonary emboli [facility])     Past Surgical History:  Procedure Laterality Date  . INCISION AND DRAINAGE ABSCESS Left 05/28/2014   Procedure: INCISION AND DRAINAGE ABSCESS LEFT  WRIST;  Surgeon: Leanora Cover, MD;  Location: [facility];  Service: Orthopedics;  Laterality: Left;  . KNEE SURGERY Left ~ 1972   "cut top of my kneecap off"  . MULTIPLE TOOTH EXTRACTIONS  11/2010   "26"  . TONSILLECTOMY AND ADENOIDECTOMY  1974  . TRANSURETHRAL RESECTION OF PROSTATE  2012; 2014       Home Medications    Prior to Admission medications   Medication Sig Start Date End Date Taking? Authorizing Provider  albuterol (PROVENTIL HFA;VENTOLIN HFA) 108 (90 BASE) MCG/ACT inhaler Inhale 1-2 puffs into the lungs every 6 (six) hours as needed for wheezing or shortness of breath. 09/20/14  Yes Quintella Reichert, MD  aspirin EC 81 MG tablet Take 81 mg by mouth every 6 (six) hours as needed for moderate pain.   Yes  Historical Provider, MD  DULoxetine (CYMBALTA) 60 MG capsule Take 1 capsule (60 mg total) by mouth 2 (two) times daily. 06/07/14  Yes Veryl Speak, MD  gabapentin (NEURONTIN) 400 MG capsule Take 1 capsule (400 mg total) by mouth 3 (three) times daily. 06/07/14  Yes Veryl Speak, MD  levothyroxine (SYNTHROID, LEVOTHROID) 50 MCG tablet Take 1 tablet (50 mcg total) by mouth daily before breakfast. 06/07/14  Yes Veryl Speak, MD  lidocaine (LIDODERM) 5 % Place 1 patch onto the skin daily. Remove & Discard patch within 12 hours or as directed by MD Patient taking differently: Place 1 patch onto the skin daily as needed (pain). Remove & Discard patch within 12 hours or as directed by MD 10/13/14  Yes Orpah Greek, MD  omeprazole (PRILOSEC) 20 MG capsule Take 1 capsule (20 mg total) by mouth 2 (two) times daily. 06/07/14  Yes Veryl Speak, MD  oxyCODONE (ROXICODONE) 5 MG immediate release tablet Take 1 tablet (5 mg total) by mouth every 6 (six) hours as needed for severe pain. 10/02/16  Yes Shawn C Joy, PA-C  oxyCODONE-acetaminophen (PERCOCET/ROXICET) 5-325 MG per tablet Take 1 tablet by mouth every 6 (six) hours as needed for moderate pain or severe pain. 09/20/14  Yes Quintella Reichert, MD  Rivaroxaban (XARELTO) 15 MG TABS tablet Take 15 mg by mouth daily. Patient states he is taking the '15mg'$  once a day verified with the patient   Yes Historical Provider, MD  tamsulosin (FLOMAX) 0.4 MG CAPS capsule Take 1 capsule (0.4 mg total) by mouth at bedtime. Patient taking differently: Take 0.4 mg by mouth 2 (two) times daily.  06/07/14  Yes Veryl Speak, MD  naproxen (NAPROSYN) 500 MG tablet Take 1 tablet (500 mg total) by mouth 2 (two) times daily. 11/07/16   Merryl Hacker, MD    Family History Family History  Problem Relation Age of Onset  . Cancer Mother   . Cancer Paternal Aunt   . Cancer Maternal Aunt     Social History Social History  Substance Use Topics  . Smoking status: Current Every Day Smoker     Packs/day: 0.50    Years: 41.00    Types: Cigarettes  . Smokeless tobacco: Former Systems developer  . Alcohol use Yes     Comment: 8 drinks a week     Allergies   Penicillins and Morphine and related   Review of Systems Review of Systems  Respiratory: Negative for shortness of breath.   Cardiovascular: Positive for chest pain.  Musculoskeletal: Positive for arthralgias and myalgias. Negative for neck pain.  All other systems reviewed and are negative.    Physical Exam Updated Vital Signs BP 120/88  Pulse 68   Temp 98.3 F (36.8 C) (Oral)   Resp 12   SpO2 100%   Physical Exam  Constitutional: He is oriented to person, place, and time. No distress.  Appears older than stated age  HENT:  Head: Normocephalic and atraumatic.  Eyes: Pupils are equal, round, and reactive to light.  Neck: Normal range of motion. Neck supple.  No midline C-spine tenderness  Cardiovascular: Normal rate, regular rhythm and normal heart sounds.   No murmur heard. Pulmonary/Chest: Effort normal and breath sounds normal. No respiratory distress. He has no wheezes. He exhibits tenderness.  No crepitus or contusion noted  Abdominal: Soft. Bowel sounds are normal. There is no tenderness. There is no rebound.  Musculoskeletal: He exhibits no edema.  Normal range of motion of the right shoulder, no tenderness palpation along the clavicle, normal range of motion of the right hip, knee, and ankle, no obvious deformities, patient does have some discoloration of the right fourth and fifth digits, 2+ DP pulse  Neurological: He is alert and oriented to person, place, and time.  Skin: Skin is warm and dry.  Psychiatric: He has a normal mood and affect.  Nursing note and vitals reviewed.    ED Treatments / Results  DIAGNOSTIC STUDIES: Oxygen Saturation is 98% on RA, normal by my interpretation.  COORDINATION OF CARE:  12:24 AM Discussed treatment plan with pt at bedside and pt agreed to plan.  Labs (all labs  ordered are listed, but only abnormal results are displayed) Labs Reviewed  CBC - Abnormal; Notable for the following:       Result Value   RBC 3.70 (*)    Hemoglobin 10.6 (*)    HCT 32.8 (*)    RDW 16.1 (*)    Platelets 471 (*)    All other components within normal limits  BASIC METABOLIC PANEL  I-STAT TROPOININ, ED    EKG  EKG Interpretation None       Radiology Dg Chest 2 View  Result Date: 11/06/2016 CLINICAL DATA:  Fall EXAM: CHEST  2 VIEW COMPARISON:  10/10/2016, 10/02/2016 CT, 10/01/2016 FINDINGS: Subpleural opacity right upper lobe corresponding to CT fat density mass again noted. No acute infiltrate or effusion. No pneumothorax. Normal heart size. No radiographic evidence for acute osseous abnormality. IMPRESSION: Stable radiographic appearance of the chest. No radiographic evidence for acute cardiopulmonary abnormality. Electronically Signed   By: Donavan Foil M.D.   On: 11/06/2016 21:48   Dg Shoulder Right  Result Date: 11/06/2016 CLINICAL DATA:  Fall right shoulder pain EXAM: RIGHT SHOULDER - 2+ VIEW COMPARISON:  None. FINDINGS: There is no evidence of fracture or dislocation. There is no evidence of arthropathy or other focal bone abnormality. Soft tissues are unremarkable. IMPRESSION: Negative. Electronically Signed   By: Donavan Foil M.D.   On: 11/06/2016 21:44   Ct Head Wo Contrast  Result Date: 11/06/2016 CLINICAL DATA:  Patient fell last week striking head. Headache and vision changes. EXAM: CT HEAD WITHOUT CONTRAST CT CERVICAL SPINE WITHOUT CONTRAST TECHNIQUE: Multidetector CT imaging of the head and cervical spine was performed following the standard protocol without intravenous contrast. Multiplanar CT image reconstructions of the cervical spine were also generated. COMPARISON:  06/16/2015 FINDINGS: CT HEAD FINDINGS Brain: Mild cerebral atrophy with mild ventricular dilatation consistent with central atrophy. No mass-effect or midline shift. No abnormal extra-axial  fluid collections. Gray-white matter junctions are distinct. Basal cisterns are not effaced. No acute intracranial hemorrhage. Vascular:  Vascular calcifications are present. Skull:  Normal. Negative for fracture or focal lesion. Sinuses/Orbits: No acute finding. Other: None. CT CERVICAL SPINE FINDINGS Alignment: Normal alignment of the cervical spine and facet joints. C1-2 articulation appears intact. Skull base and vertebrae: Skullbase is unremarkable. No vertebral compression deformities. No focal bone lesion or bone destruction. Soft tissues and spinal canal: No prevertebral fluid or swelling. No visible canal hematoma. Disc levels: Degenerative changes with disc space narrowing and endplate hypertrophic changes most prominent at C5-6 and C6-7 levels. Degenerative changes in the facet joints. Upper chest: Emphysematous changes in the lung apices. Vascular calcifications. Other: None. IMPRESSION: No acute intracranial abnormalities.  Mild chronic atrophy. Normal alignment of the cervical spine. Mild degenerative changes. No acute displaced fractures identified. Electronically Signed   By: Lucienne Capers M.D.   On: 11/06/2016 22:09   Ct Cervical Spine Wo Contrast  Result Date: 11/06/2016 CLINICAL DATA:  Patient fell last week striking head. Headache and vision changes. EXAM: CT HEAD WITHOUT CONTRAST CT CERVICAL SPINE WITHOUT CONTRAST TECHNIQUE: Multidetector CT imaging of the head and cervical spine was performed following the standard protocol without intravenous contrast. Multiplanar CT image reconstructions of the cervical spine were also generated. COMPARISON:  06/16/2015 FINDINGS: CT HEAD FINDINGS Brain: Mild cerebral atrophy with mild ventricular dilatation consistent with central atrophy. No mass-effect or midline shift. No abnormal extra-axial fluid collections. Gray-white matter junctions are distinct. Basal cisterns are not effaced. No acute intracranial hemorrhage. Vascular:  Vascular calcifications  are present. Skull: Normal. Negative for fracture or focal lesion. Sinuses/Orbits: No acute finding. Other: None. CT CERVICAL SPINE FINDINGS Alignment: Normal alignment of the cervical spine and facet joints. C1-2 articulation appears intact. Skull base and vertebrae: Skullbase is unremarkable. No vertebral compression deformities. No focal bone lesion or bone destruction. Soft tissues and spinal canal: No prevertebral fluid or swelling. No visible canal hematoma. Disc levels: Degenerative changes with disc space narrowing and endplate hypertrophic changes most prominent at C5-6 and C6-7 levels. Degenerative changes in the facet joints. Upper chest: Emphysematous changes in the lung apices. Vascular calcifications. Other: None. IMPRESSION: No acute intracranial abnormalities.  Mild chronic atrophy. Normal alignment of the cervical spine. Mild degenerative changes. No acute displaced fractures identified. Electronically Signed   By: Lucienne Capers M.D.   On: 11/06/2016 22:09   Dg Foot Complete Right  Result Date: 11/06/2016 CLINICAL DATA:  Fall with pain EXAM: RIGHT FOOT COMPLETE - 3+ VIEW COMPARISON:  None. FINDINGS: Subacute fracture involving the shaft of the fifth proximal phalanx with periosteal new bone formation evident. More acute appearing fracture involving the proximal shaft of the fourth proximal phalanx. Possible articular extension to the fourth MCP joint. Minimal displacement. No significant angulation. IMPRESSION: 1. Acute slightly displaced possible intra-articular fracture involving the proximal fourth proximal phalanx. 2. Subacute fracture involving the midshaft of the fifth proximal phalanx. Electronically Signed   By: Donavan Foil M.D.   On: 11/06/2016 21:46    Procedures Procedures (including critical care time)  Medications Ordered in ED Medications  ketorolac (TORADOL) 15 MG/ML injection 15 mg (15 mg Intramuscular Given 11/07/16 0202)  cyclobenzaprine (FLEXERIL) tablet 5 mg (5 mg  Oral Given 11/07/16 0202)     Initial Impression / Assessment and Plan / ED Course  I have reviewed the triage vital signs and the nursing notes.  Pertinent labs & imaging results that were available during my care of the patient were reviewed by me and considered in my medical decision making (see chart for details).     Patient presents  following a fall on Saturday. Reports right-sided pain. Reports recent multiple mechanical falls. Also reports ongoing medical issues and he does not have a primary physician. He is concerned about this. Given that his fall occurred on Saturday, doubt acute emergent process. X-rays and CT scan obtained from triage reviewed. He does have right fourth and fifth toe fractures. Buddy tape and postop shoe provided. He was given information regarding [facility]. I have also emailed our case manager to touch base with him regarding getting set up for a primary physician.  After history, exam, and medical workup I feel the patient has been appropriately medically screened and is safe for discharge home. Pertinent diagnoses were discussed with the patient. Patient was given return precautions.   Final Clinical Impressions(s) / ED Diagnoses   Final diagnoses:  Fall, initial encounter  Closed nondisplaced fracture of phalanx of lesser toe of right foot, unspecified phalanx, initial encounter   I personally performed the services described in this documentation, which was scribed in my presence. The recorded information has been reviewed and is accurate.  New Prescriptions New Prescriptions   NAPROXEN (NAPROSYN) 500 MG TABLET    Take 1 tablet (500 mg total) by mouth 2 (two) times daily.     Merryl Hacker, MD 11/07/16 [phone]

## 2016-11-07 NOTE — ED Notes (Signed)
Ortho tech paged  

## 2017-01-07 ENCOUNTER — Emergency Department (HOSPITAL_COMMUNITY): Payer: Medicaid Other

## 2017-01-07 ENCOUNTER — Encounter (HOSPITAL_COMMUNITY): Payer: Self-pay | Admitting: Emergency Medicine

## 2017-01-07 DIAGNOSIS — Z7901 Long term (current) use of anticoagulants: Secondary | ICD-10-CM | POA: Insufficient documentation

## 2017-01-07 DIAGNOSIS — I10 Essential (primary) hypertension: Secondary | ICD-10-CM | POA: Insufficient documentation

## 2017-01-07 DIAGNOSIS — F17210 Nicotine dependence, cigarettes, uncomplicated: Secondary | ICD-10-CM | POA: Diagnosis not present

## 2017-01-07 DIAGNOSIS — M6281 Muscle weakness (generalized): Secondary | ICD-10-CM | POA: Diagnosis not present

## 2017-01-07 DIAGNOSIS — Z7982 Long term (current) use of aspirin: Secondary | ICD-10-CM | POA: Diagnosis not present

## 2017-01-07 DIAGNOSIS — M545 Low back pain: Secondary | ICD-10-CM | POA: Diagnosis present

## 2017-01-07 DIAGNOSIS — E039 Hypothyroidism, unspecified: Secondary | ICD-10-CM | POA: Insufficient documentation

## 2017-01-07 DIAGNOSIS — J449 Chronic obstructive pulmonary disease, unspecified: Secondary | ICD-10-CM | POA: Diagnosis not present

## 2017-01-07 DIAGNOSIS — R51 Headache: Secondary | ICD-10-CM | POA: Insufficient documentation

## 2017-01-07 DIAGNOSIS — Z8673 Personal history of transient ischemic attack (TIA), and cerebral infarction without residual deficits: Secondary | ICD-10-CM | POA: Insufficient documentation

## 2017-01-07 DIAGNOSIS — C3492 Malignant neoplasm of unspecified part of left bronchus or lung: Secondary | ICD-10-CM | POA: Diagnosis not present

## 2017-01-07 DIAGNOSIS — Z79899 Other long term (current) drug therapy: Secondary | ICD-10-CM | POA: Diagnosis not present

## 2017-01-07 LAB — BASIC METABOLIC PANEL
Anion gap: 12 (ref 5–15)
BUN: 5 mg/dL — AB (ref 6–20)
CHLORIDE: 111 mmol/L (ref 101–111)
CO2: 20 mmol/L — ABNORMAL LOW (ref 22–32)
CREATININE: 1.11 mg/dL (ref 0.61–1.24)
Calcium: 8.9 mg/dL (ref 8.9–10.3)
GFR calc Af Amer: >60 mL/min (ref >60)
GLUCOSE: 88 mg/dL (ref 65–99)
POTASSIUM: 4 mmol/L (ref 3.5–5.1)
Sodium: 143 mmol/L (ref 135–145)

## 2017-01-07 LAB — URINALYSIS, ROUTINE W REFLEX MICROSCOPIC
BACTERIA UA: NONE SEEN
Bilirubin Urine: NEGATIVE
Glucose, UA: NEGATIVE mg/dL
Hgb urine dipstick: NEGATIVE
Ketones, ur: NEGATIVE mg/dL
Nitrite: NEGATIVE
PH: 6 (ref 5.0–8.0)
Protein, ur: NEGATIVE mg/dL
SPECIFIC GRAVITY, URINE: 1.002 — AB (ref 1.005–1.030)
SQUAMOUS EPITHELIAL / LPF: NONE SEEN

## 2017-01-07 LAB — CBC
HEMATOCRIT: 36 % — AB (ref 39.0–52.0)
Hemoglobin: 11.4 g/dL — ABNORMAL LOW (ref 13.0–17.0)
MCH: 28.2 pg (ref 26.0–34.0)
MCHC: 31.7 g/dL (ref 30.0–36.0)
MCV: 89.1 fL (ref 78.0–100.0)
Platelets: 417 10*3/uL — ABNORMAL HIGH (ref 150–400)
RBC: 4.04 MIL/uL — ABNORMAL LOW (ref 4.22–5.81)
RDW: 16.8 % — AB (ref 11.5–15.5)
WBC: 7.2 10*3/uL (ref 4.0–10.5)

## 2017-01-07 NOTE — ED Triage Notes (Signed)
Pt states he has been passing out recently. Pt passed out getting out of a car and hit his neck. Pt is on blood thinners. Pt states "my legs are full of blood clots." Pt complains of chest pain as well. Pt alert and oriented at triage.

## 2017-01-08 ENCOUNTER — Emergency Department (HOSPITAL_COMMUNITY): Payer: Medicaid Other

## 2017-01-08 ENCOUNTER — Emergency Department (HOSPITAL_COMMUNITY)
Admission: EM | Admit: 2017-01-08 | Discharge: 2017-01-08 | Disposition: A | Discharge status: Home / Self Care | Payer: Medicaid Other | Attending: Emergency Medicine | Admitting: Emergency Medicine

## 2017-01-08 ENCOUNTER — Encounter (HOSPITAL_COMMUNITY): Payer: Self-pay | Admitting: *Deleted

## 2017-01-08 DIAGNOSIS — C3490 Malignant neoplasm of unspecified part of unspecified bronchus or lung: Secondary | ICD-10-CM

## 2017-01-08 DIAGNOSIS — R29898 Other symptoms and signs involving the musculoskeletal system: Secondary | ICD-10-CM

## 2017-01-08 DIAGNOSIS — Z7901 Long term (current) use of anticoagulants: Secondary | ICD-10-CM

## 2017-01-08 DIAGNOSIS — M542 Cervicalgia: Secondary | ICD-10-CM

## 2017-01-08 LAB — CBG MONITORING, ED: GLUCOSE-CAPILLARY: 119 mg/dL — AB (ref 65–99)

## 2017-01-08 MED ORDER — IBUPROFEN 400 MG PO TABS: 400.0000 mg | ORAL_TABLET | Freq: Three times a day (TID) | ORAL | 0 refills | Status: DC | PRN

## 2017-01-08 MED ORDER — CYCLOBENZAPRINE HCL 10 MG PO TABS: 10.0000 mg | ORAL_TABLET | Freq: Three times a day (TID) | ORAL | 0 refills | Status: DC | PRN

## 2017-01-08 MED ORDER — OXYCODONE-ACETAMINOPHEN 5-325 MG PO TABS
2.0000 | ORAL_TABLET | Freq: Once | ORAL | Status: AC
  Administered 2017-01-08: 2 via ORAL
  Filled 2017-01-08: qty 2

## 2017-01-08 NOTE — ED Notes (Signed)
All jewelry removed for MRI

## 2017-01-08 NOTE — ED Provider Notes (Signed)
[facility] DEPT Provider Note   CSN: [id number] Arrival date & time: 01/07/17  1811  By signing my name below, I, Robert [name], attest that this documentation has been prepared under the direction and in the presence of [name], MD.  Electronically Signed: [name], ED Scribe. 01/08/17. 2:48 AM.  History   Chief Complaint Chief Complaint  Patient presents with  . Neck Pain   The history is provided by the patient. No language interpreter was used.   HPI Comments: Robert [name] is a 56 y.o. male who has a PMhx of chronic liver failure, COPD, hepatitis C, HTN, hypotension, hypothyroidism, liver cancer, MI, stroke, and pulmonary emboli presents to the Emergency Department complaining of acute onset, moderate neck pain that radiate into his bilateral arms x 6 weeks. He reports associated acute onset bilateral hand weakness. Pt reports falling on 11/07/16, in which he was seen and evaluated in the ED. He was discharged with Toradol and Flexeril. He says that his neck pain has been progressively worse since his fall. Pt is currently on Xarelto. Pt says he was taking BC powders to alleviate his pain without relief. He was on chronic pain medication for his recurrent neck pain and recently had been taken off of his pain medication ~ 3 months ago. Pt denies chest pain or numbness.  Past Medical History:  Diagnosis Date  . Anxiety   . Arthritis    "I'm eat up w/it"  . Asthma   . B12 deficiency    "give myself shots"  . Chronic liver failure ([place])   . Chronic lower back pain   . COPD (chronic obstructive pulmonary disease) ([place])   . Deep vein thrombosis (HCC)    "several"  . Depression   . GERD (gastroesophageal reflux disease)   . Hepatitis C   . History of blood transfusion 2013   "related to kidneys shutting down"  . Hypertension   . Hypothyroidism   . Intermittent self-catheterization of bladder   . Liver cancer ([place])   . Metastatic lung cancer    "left"  .  Migraines    "2-3/day" (05/27/2014)  . Myocardial infarction 2007; ~ 2011  . Oxygen dependent    3L; 24/7" (05/27/2014)  . Partial small bowel obstruction   . Pneumonia    "several times"  . Pulmonary emboli (HCC)    "several"  . Shortness of breath    "all the time"  . Stroke [facility]) ~ 03/2012   "couldn't use my left hand for ~ 6 months" (05/27/2014)  . Stroke [facility]) 05/25/2014   "not able to use my left hand again" (05/27/2014)    Patient Active Problem List   Diagnosis Date Noted  . Disturbance of skin sensation 05/29/2014  . Cellulitis 05/27/2014  . Cellulitis of left hand 05/27/2014  . Chronically on opiate therapy 05/27/2014  . Chronic anticoagulation 05/27/2014  . COPD (chronic obstructive pulmonary disease) ([place]) 04/17/2012  . Leg pain, right 04/17/2012  . Low back pain 04/17/2012  . Hepatitis C 04/17/2012  . Adynamic ileus ([place]) 04/17/2012  . Liver cancer ([place])   . Lung cancer ([place])   . Pulmonary emboli [facility])     Past Surgical History:  Procedure Laterality Date  . INCISION AND DRAINAGE ABSCESS Left 05/28/2014   Procedure: INCISION AND DRAINAGE ABSCESS LEFT WRIST;  Surgeon: Leanora Cover, MD;  Location: [facility];  Service: Orthopedics;  Laterality: Left;  . KNEE SURGERY Left ~ 1972   "cut top of my kneecap off"  .  MULTIPLE TOOTH EXTRACTIONS  11/2010   "26"  . TONSILLECTOMY AND ADENOIDECTOMY  1974  . TRANSURETHRAL RESECTION OF PROSTATE  2012; 2014       Home Medications    Prior to Admission medications   Medication Sig Start Date End Date Taking? Authorizing Provider  albuterol (PROVENTIL HFA;VENTOLIN HFA) 108 (90 BASE) MCG/ACT inhaler Inhale 1-2 puffs into the lungs every 6 (six) hours as needed for wheezing or shortness of breath. 09/20/14   Quintella Reichert, MD  aspirin EC 81 MG tablet Take 81 mg by mouth every 6 (six) hours as needed for moderate pain.    Historical Provider, MD  DULoxetine (CYMBALTA) 60 MG capsule Take 1 capsule (60 mg total) by mouth 2 (two) times  daily. 06/07/14   Veryl Speak, MD  gabapentin (NEURONTIN) 400 MG capsule Take 1 capsule (400 mg total) by mouth 3 (three) times daily. 06/07/14   Veryl Speak, MD  levothyroxine (SYNTHROID, LEVOTHROID) 50 MCG tablet Take 1 tablet (50 mcg total) by mouth daily before breakfast. 06/07/14   Veryl Speak, MD  lidocaine (LIDODERM) 5 % Place 1 patch onto the skin daily. Remove & Discard patch within 12 hours or as directed by MD Patient taking differently: Place 1 patch onto the skin daily as needed (pain). Remove & Discard patch within 12 hours or as directed by MD 10/13/14   Orpah Greek, MD  naproxen (NAPROSYN) 500 MG tablet Take 1 tablet (500 mg total) by mouth 2 (two) times daily. 11/07/16   Merryl Hacker, MD  omeprazole (PRILOSEC) 20 MG capsule Take 1 capsule (20 mg total) by mouth 2 (two) times daily. 06/07/14   Veryl Speak, MD  oxyCODONE (ROXICODONE) 5 MG immediate release tablet Take 1 tablet (5 mg total) by mouth every 6 (six) hours as needed for severe pain. 10/02/16   Shawn C Joy, PA-C  oxyCODONE-acetaminophen (PERCOCET/ROXICET) 5-325 MG per tablet Take 1 tablet by mouth every 6 (six) hours as needed for moderate pain or severe pain. 09/20/14   Quintella Reichert, MD  Rivaroxaban (XARELTO) 15 MG TABS tablet Take 15 mg by mouth daily. Patient states he is taking the '15mg'$  once a day verified with the patient    Historical Provider, MD  tamsulosin (FLOMAX) 0.4 MG CAPS capsule Take 1 capsule (0.4 mg total) by mouth at bedtime. Patient taking differently: Take 0.4 mg by mouth 2 (two) times daily.  06/07/14   Veryl Speak, MD    Family History Family History  Problem Relation Age of Onset  . Cancer Mother   . Cancer Paternal Aunt   . Cancer Maternal Aunt     Social History Social History  Substance Use Topics  . Smoking status: Current Every Day Smoker    Packs/day: 0.50    Years: 41.00    Types: Cigarettes  . Smokeless tobacco: Former Systems developer  . Alcohol use Yes     Comment: 8 drinks a week       Allergies   Penicillins and Morphine and related   Review of Systems Review of Systems  A complete 10 system review of systems was obtained and all systems are negative except as noted in the HPI and PMH.    Physical Exam Updated Vital Signs BP 107/64   Pulse 85   Temp 97.9 F (36.6 C)   Resp 18   Ht '5\' 7"'$  (1.702 m)   Wt 150 lb (68 kg)   SpO2 100%   BMI 23.49 kg/m   Physical Exam  Constitutional: He is oriented to person, place, and time. He appears well-developed and well-nourished.  HENT:  Head: Normocephalic and atraumatic.  Eyes: EOM are normal.  Neck: Normal range of motion. Neck supple.  Mild cervical paracervical tenderness without cervical step-off.  Cardiovascular: Normal rate, regular rhythm, normal heart sounds and intact distal pulses.   Pulmonary/Chest: Effort normal and breath sounds normal. No respiratory distress.  Abdominal: Soft. He exhibits no distension. There is no tenderness.  Musculoskeletal: Normal range of motion.  Neurological: He is alert and oriented to person, place, and time.  5 over 5 strength in bilateral upper lower extremity major muscle groups.  Skin: Skin is warm and dry.  Psychiatric: He has a normal mood and affect. Judgment normal.  Nursing note and vitals reviewed.    ED Treatments / Results  DIAGNOSTIC STUDIES: Oxygen Saturation is 100% on RA, normal by my interpretation.  COORDINATION OF CARE: 2:48 AM Discussed treatment plan with pt at bedside and pt agreed to plan.  Labs (all labs ordered are listed, but only abnormal results are displayed) Labs Reviewed  BASIC METABOLIC PANEL - Abnormal; Notable for the following:       Result Value   CO2 20 (*)    BUN 5 (*)    All other components within normal limits  CBC - Abnormal; Notable for the following:    RBC 4.04 (*)    Hemoglobin 11.4 (*)    HCT 36.0 (*)    RDW 16.8 (*)    Platelets 417 (*)    All other components within normal limits  URINALYSIS, ROUTINE  W REFLEX MICROSCOPIC - Abnormal; Notable for the following:    Color, Urine COLORLESS (*)    Specific Gravity, Urine 1.002 (*)    Leukocytes, UA SMALL (*)    All other components within normal limits  CBG MONITORING, ED    EKG  EKG Interpretation None       Radiology Dg Chest 2 View  Result Date: 01/07/2017 CLINICAL DATA:  Evaluate for fracture. Recent sternal fracture per patient. EXAM: CHEST  2 VIEW COMPARISON:  Prior radiograph from 11/06/2016. FINDINGS: Cardiac and mediastinal silhouettes are stable in size and contour, and remain within normal limits. Lungs normally inflated. No focal infiltrates. No pulmonary edema or pleural effusion. No pneumothorax. Subpleural opacity at the peripheral right upper lobe noted, corresponding to fat density on previous CT, stable. No acute osseous abnormality.  No acute displaced sternal fracture. IMPRESSION: Stable appearance of the chest. No radiographic evidence for active cardiopulmonary disease. No acute fracture identified. Electronically Signed   By: Jeannine Boga M.D.   On: 01/07/2017 20:05   Ct Head Wo Contrast  Result Date: 01/07/2017 CLINICAL DATA:  Dizziness, unsteady gait and headache. Fell 6 weeks ago. History of pulmonary embolism, chronic liver failure, stroke, metastatic lung cancer. EXAM: CT HEAD WITHOUT CONTRAST CT CERVICAL SPINE WITHOUT CONTRAST TECHNIQUE: Multidetector CT imaging of the head and cervical spine was performed following the standard protocol without intravenous contrast. Multiplanar CT image reconstructions of the cervical spine were also generated. COMPARISON:  CT HEAD and cervical spine November 06, 2016 FINDINGS: CT HEAD FINDINGS BRAIN: No intraparenchymal hemorrhage, mass effect nor midline shift. The ventricles and sulci are mildly prominent for age. No acute large vascular territory infarcts. No abnormal extra-axial fluid collections. Basal cisterns are patent. VASCULAR: Unremarkable. SKULL/SOFT TISSUES: No  skull fracture. No significant soft tissue swelling. ORBITS/SINUSES: The included ocular globes and orbital contents are normal.Mild acute on chronic maxillary sinusitis.  Mastoid air cells are well aerated. OTHER:  Patient is edentulous. CT CERVICAL SPINE FINDINGS ALIGNMENT: Maintained lordosis. Vertebral bodies in alignment. SKULL BASE AND VERTEBRAE: Cervical vertebral bodies and posterior elements are intact. Moderate to severe C6-7 disc height loss, moderate C5-6 with uncovertebral hypertrophy and endplate spurring compatible with degenerative discs. Moderate LEFT upper cervical facet arthropathy. C1-2 articulation maintained with mild arthropathy. No destructive bony lesions. SOFT TISSUES AND SPINAL CANAL: Nonacute. Severe RIGHT and moderate LEFT calcific atherosclerosis of the carotid bifurcations. DISC LEVELS: No significant osseous canal stenosis. Moderate C5-6 and C6-7 LEFT greater than RIGHT neural foraminal narrowing. UPPER CHEST: Severe centrilobular emphysema, RIGHT apical scarring and included lung apices. OTHER: None. IMPRESSION: CT HEAD: No acute intracranial process. Stable examination including mild parenchymal brain volume loss for age. CT CERVICAL SPINE: No acute fracture or malalignment. Atherosclerosis with suspected stenosis RIGHT internal carotid artery for which follow-up CTA NECK or carotid ultrasound is recommended on a nonemergent basis. Electronically Signed   By: Elon Alas M.D.   On: 01/07/2017 19:33   Ct Cervical Spine Wo Contrast  Result Date: 01/07/2017 CLINICAL DATA:  Dizziness, unsteady gait and headache. Fell 6 weeks ago. History of pulmonary embolism, chronic liver failure, stroke, metastatic lung cancer. EXAM: CT HEAD WITHOUT CONTRAST CT CERVICAL SPINE WITHOUT CONTRAST TECHNIQUE: Multidetector CT imaging of the head and cervical spine was performed following the standard protocol without intravenous contrast. Multiplanar CT image reconstructions of the cervical spine  were also generated. COMPARISON:  CT HEAD and cervical spine November 06, 2016 FINDINGS: CT HEAD FINDINGS BRAIN: No intraparenchymal hemorrhage, mass effect nor midline shift. The ventricles and sulci are mildly prominent for age. No acute large vascular territory infarcts. No abnormal extra-axial fluid collections. Basal cisterns are patent. VASCULAR: Unremarkable. SKULL/SOFT TISSUES: No skull fracture. No significant soft tissue swelling. ORBITS/SINUSES: The included ocular globes and orbital contents are normal.Mild acute on chronic maxillary sinusitis. Mastoid air cells are well aerated. OTHER:  Patient is edentulous. CT CERVICAL SPINE FINDINGS ALIGNMENT: Maintained lordosis. Vertebral bodies in alignment. SKULL BASE AND VERTEBRAE: Cervical vertebral bodies and posterior elements are intact. Moderate to severe C6-7 disc height loss, moderate C5-6 with uncovertebral hypertrophy and endplate spurring compatible with degenerative discs. Moderate LEFT upper cervical facet arthropathy. C1-2 articulation maintained with mild arthropathy. No destructive bony lesions. SOFT TISSUES AND SPINAL CANAL: Nonacute. Severe RIGHT and moderate LEFT calcific atherosclerosis of the carotid bifurcations. DISC LEVELS: No significant osseous canal stenosis. Moderate C5-6 and C6-7 LEFT greater than RIGHT neural foraminal narrowing. UPPER CHEST: Severe centrilobular emphysema, RIGHT apical scarring and included lung apices. OTHER: None. IMPRESSION: CT HEAD: No acute intracranial process. Stable examination including mild parenchymal brain volume loss for age. CT CERVICAL SPINE: No acute fracture or malalignment. Atherosclerosis with suspected stenosis RIGHT internal carotid artery for which follow-up CTA NECK or carotid ultrasound is recommended on a nonemergent basis. Electronically Signed   By: Elon Alas M.D.   On: 01/07/2017 19:33    Procedures Procedures (including critical care time)  Medications Ordered in  ED Medications - No data to display   Initial Impression / Assessment and Plan / ED Course  I have reviewed the triage vital signs and the nursing notes.  Pertinent labs & imaging results that were available during my care of the patient were reviewed by me and considered in my medical decision making (see chart for details).   arthritic changes in neck.  Likely causes pain.  He has a long-standing history of chronic pain  for which he is being referred back to her primary care physician.Marland Kitchen  He will need referral to a pain clinic.  Final Clinical Impressions(s) / ED Diagnoses   Final diagnoses:  None   I personally performed the services described in this documentation, which was scribed in my presence. The recorded information has been reviewed and is accurate.     New Prescriptions New Prescriptions   No medications on file     [name], MD 01/08/17 [phone]

## 2017-01-08 NOTE — ED Notes (Signed)
ED Provider at bedside. 

## 2017-01-08 NOTE — ED Notes (Signed)
Patient transported to MRI 

## 2017-02-25 DIAGNOSIS — N390 Urinary tract infection, site not specified: Secondary | ICD-10-CM | POA: Diagnosis not present

## 2017-02-25 DIAGNOSIS — G8929 Other chronic pain: Secondary | ICD-10-CM | POA: Diagnosis not present

## 2017-02-25 DIAGNOSIS — J181 Lobar pneumonia, unspecified organism: Secondary | ICD-10-CM | POA: Diagnosis not present

## 2017-02-25 DIAGNOSIS — R55 Syncope and collapse: Secondary | ICD-10-CM | POA: Diagnosis not present

## 2017-02-25 DIAGNOSIS — F329 Major depressive disorder, single episode, unspecified: Secondary | ICD-10-CM | POA: Diagnosis not present

## 2017-02-25 DIAGNOSIS — F10929 Alcohol use, unspecified with intoxication, unspecified: Secondary | ICD-10-CM | POA: Diagnosis not present

## 2017-02-25 DIAGNOSIS — Z86718 Personal history of other venous thrombosis and embolism: Secondary | ICD-10-CM | POA: Diagnosis not present

## 2017-02-25 DIAGNOSIS — Z87438 Personal history of other diseases of male genital organs: Secondary | ICD-10-CM | POA: Diagnosis not present

## 2017-02-25 DIAGNOSIS — I959 Hypotension, unspecified: Secondary | ICD-10-CM | POA: Diagnosis not present

## 2017-02-25 DIAGNOSIS — Z86711 Personal history of pulmonary embolism: Secondary | ICD-10-CM | POA: Diagnosis not present

## 2017-02-26 DIAGNOSIS — Z87438 Personal history of other diseases of male genital organs: Secondary | ICD-10-CM | POA: Diagnosis not present

## 2017-02-26 DIAGNOSIS — R55 Syncope and collapse: Secondary | ICD-10-CM | POA: Diagnosis not present

## 2017-02-26 DIAGNOSIS — I959 Hypotension, unspecified: Secondary | ICD-10-CM | POA: Diagnosis not present

## 2017-02-26 DIAGNOSIS — F10929 Alcohol use, unspecified with intoxication, unspecified: Secondary | ICD-10-CM | POA: Diagnosis not present

## 2017-09-26 DIAGNOSIS — F10231 Alcohol dependence with withdrawal delirium: Secondary | ICD-10-CM

## 2017-09-26 DIAGNOSIS — R569 Unspecified convulsions: Secondary | ICD-10-CM | POA: Diagnosis not present

## 2017-09-26 DIAGNOSIS — F10929 Alcohol use, unspecified with intoxication, unspecified: Secondary | ICD-10-CM | POA: Diagnosis not present

## 2017-09-27 DIAGNOSIS — F10929 Alcohol use, unspecified with intoxication, unspecified: Secondary | ICD-10-CM | POA: Diagnosis not present

## 2017-09-27 DIAGNOSIS — F10231 Alcohol dependence with withdrawal delirium: Secondary | ICD-10-CM | POA: Diagnosis not present

## 2017-09-27 DIAGNOSIS — R569 Unspecified convulsions: Secondary | ICD-10-CM | POA: Diagnosis not present

## 2017-09-28 DIAGNOSIS — R569 Unspecified convulsions: Secondary | ICD-10-CM | POA: Diagnosis not present

## 2017-09-28 DIAGNOSIS — F10929 Alcohol use, unspecified with intoxication, unspecified: Secondary | ICD-10-CM | POA: Diagnosis not present

## 2017-09-28 DIAGNOSIS — F10231 Alcohol dependence with withdrawal delirium: Secondary | ICD-10-CM | POA: Diagnosis not present

## 2017-09-29 DIAGNOSIS — R569 Unspecified convulsions: Secondary | ICD-10-CM | POA: Diagnosis not present

## 2017-09-29 DIAGNOSIS — F10929 Alcohol use, unspecified with intoxication, unspecified: Secondary | ICD-10-CM | POA: Diagnosis not present

## 2017-09-29 DIAGNOSIS — F10231 Alcohol dependence with withdrawal delirium: Secondary | ICD-10-CM | POA: Diagnosis not present

## 2017-09-30 DIAGNOSIS — F10231 Alcohol dependence with withdrawal delirium: Secondary | ICD-10-CM | POA: Diagnosis not present

## 2017-09-30 DIAGNOSIS — R569 Unspecified convulsions: Secondary | ICD-10-CM | POA: Diagnosis not present

## 2017-09-30 DIAGNOSIS — F10929 Alcohol use, unspecified with intoxication, unspecified: Secondary | ICD-10-CM | POA: Diagnosis not present

## 2018-02-02 IMAGING — CR DG FOOT COMPLETE 3+V*L*
3 series · 3 of 3 positions shown · non-contrast
Comparison: None.

CLINICAL DATA: Fall with lateral foot pain

EXAM:
LEFT FOOT - COMPLETE 3+ VIEW

[foot ap]
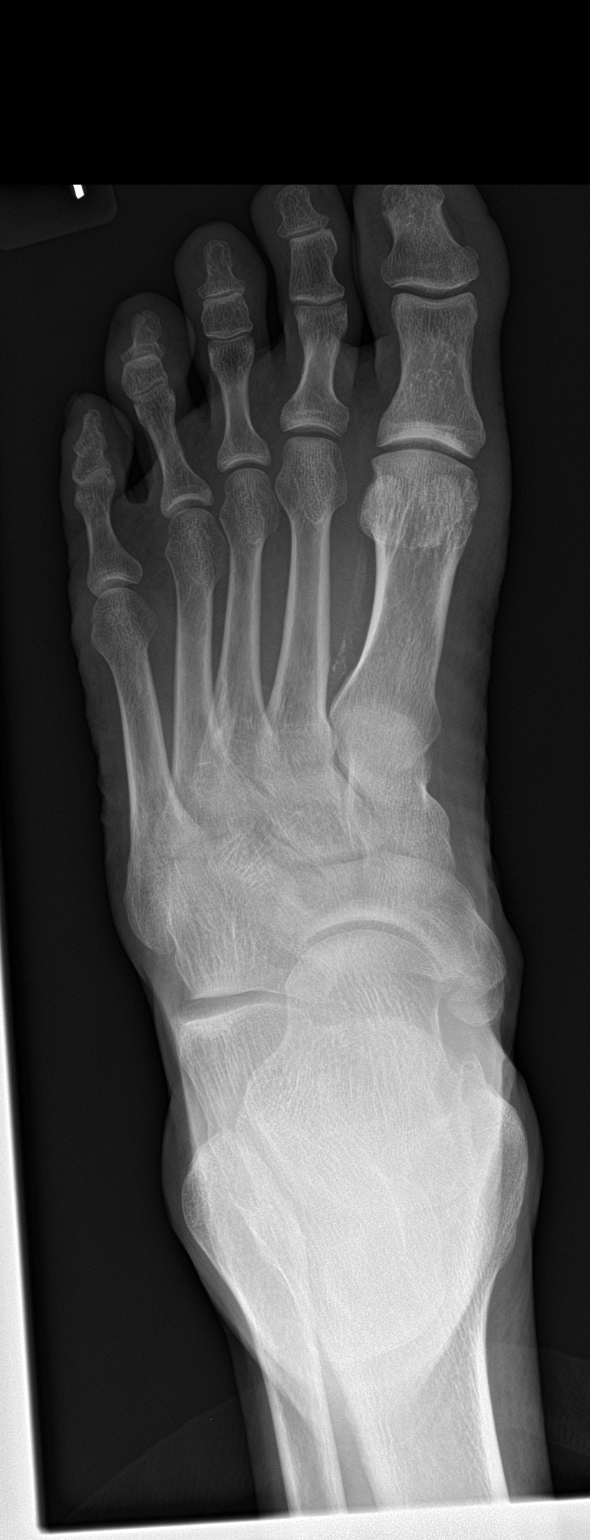

[foot obl]
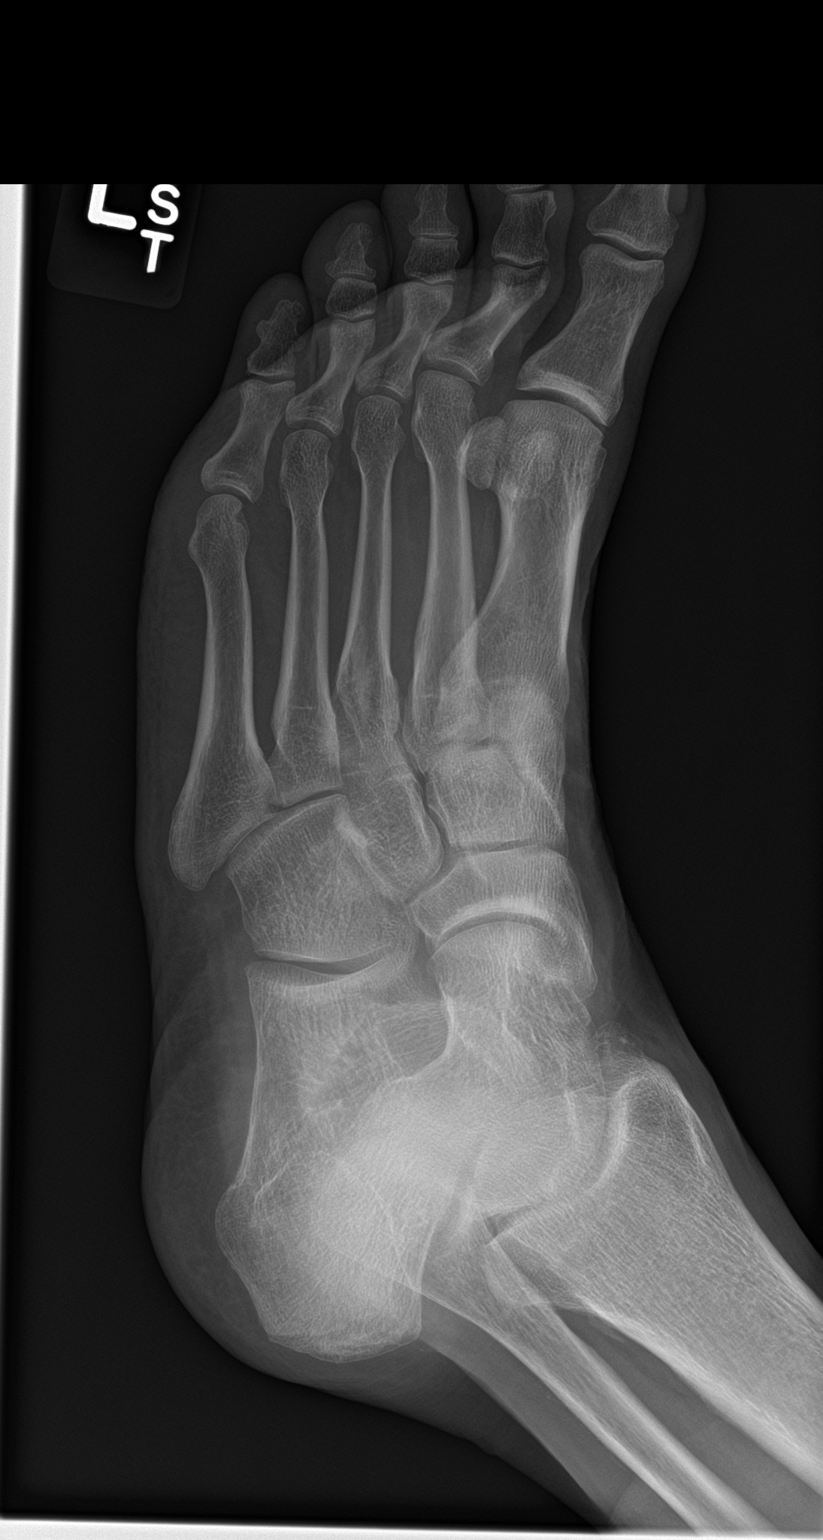

[foot lat]
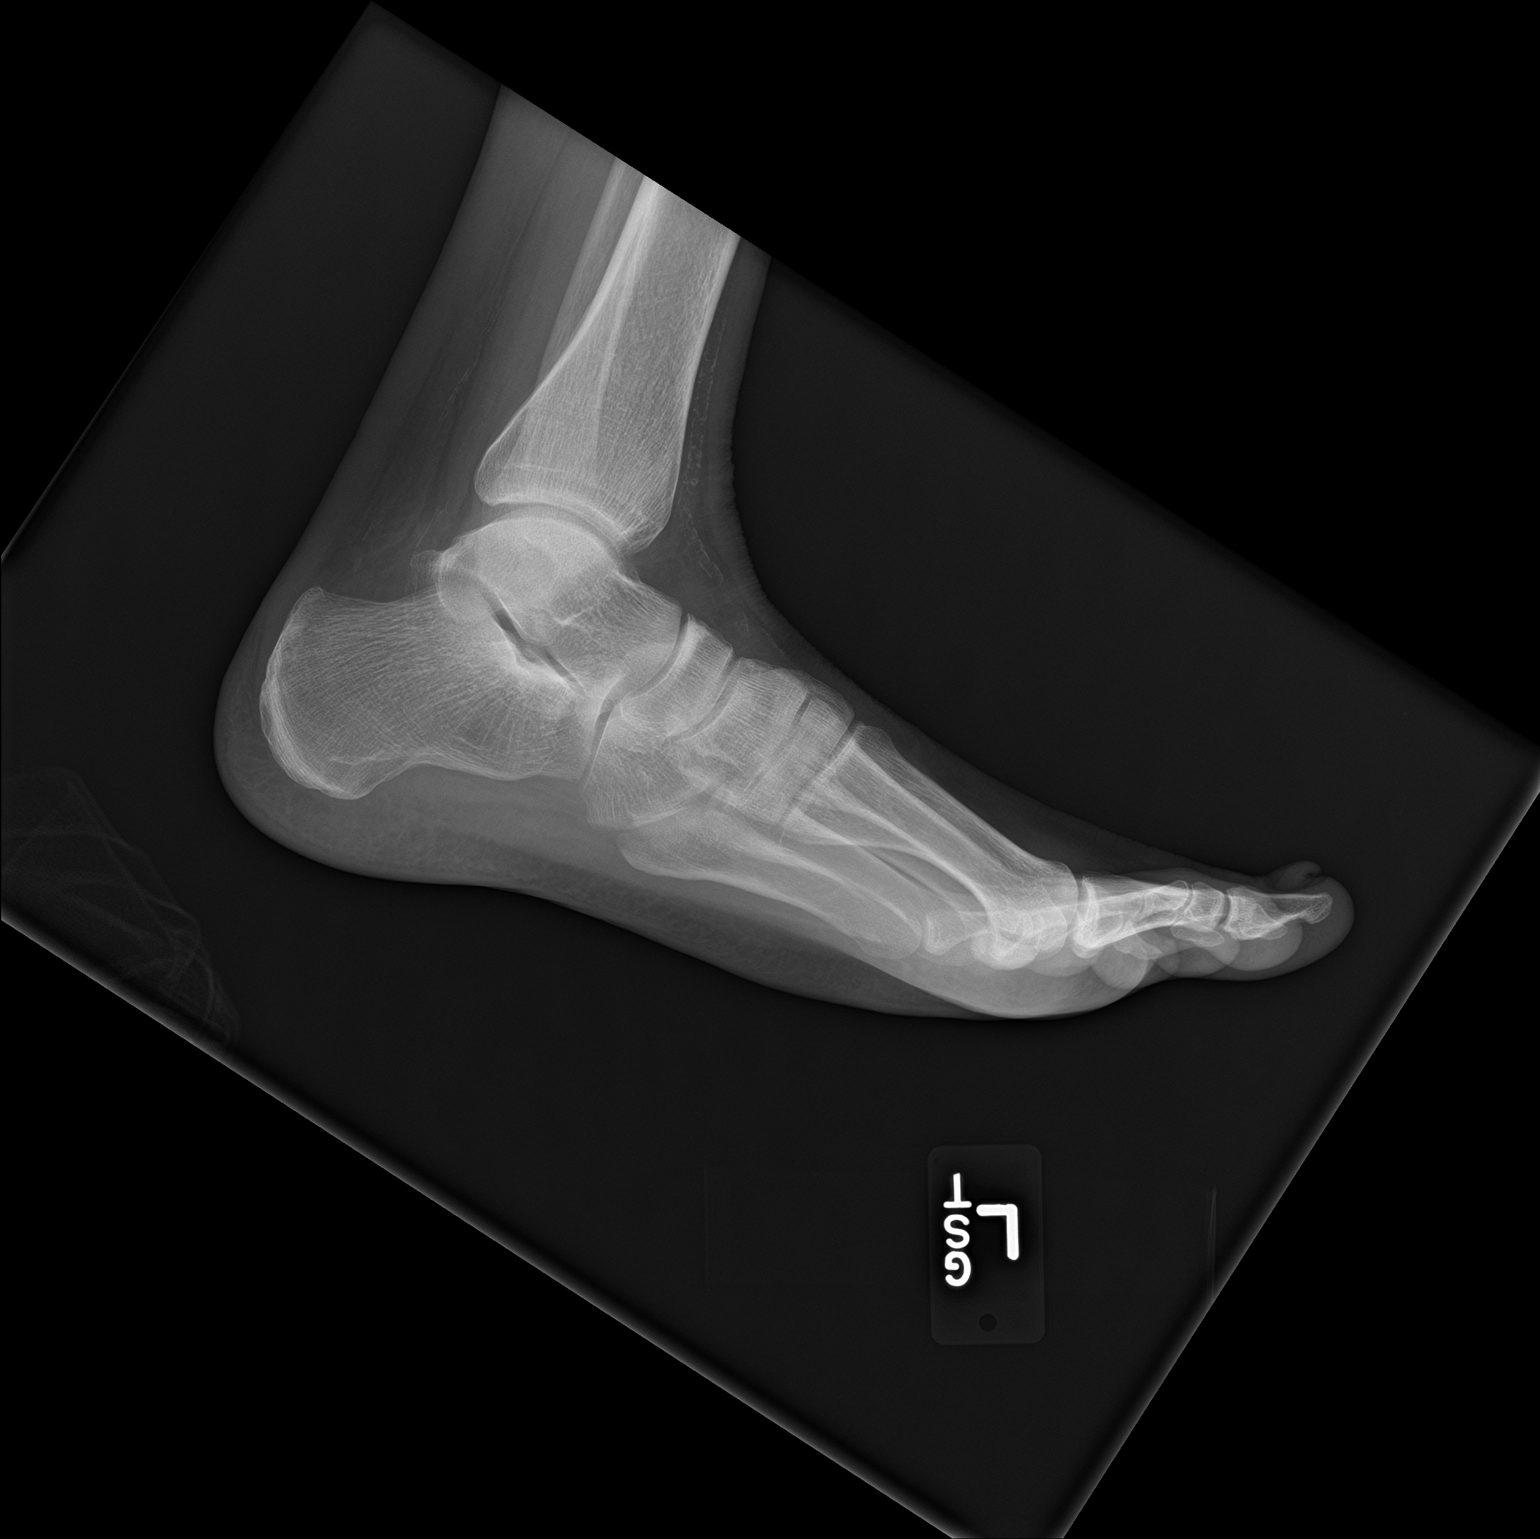

[3 of 3 positions shown; findings below may reference images not displayed]

FINDINGS: No acute fracture or subluxation is seen. There are vascular
calcifications in the soft tissues. Accessory os adjacent to the
navicular bone. Mild degenerative changes at the first MTP joint.
IMPRESSION: No acute osseous abnormality

## 2018-02-02 IMAGING — CT CT CHEST W/O CM
2 of 3 series · 13 of 36 positions shown, 16 images · non-contrast
Comparison: Radiograph 10/01/2016, CT chest 03/02/2016

ADDENDUM:
Again visualized is an old fracture of the upper sternal body.
CLINICAL DATA: Chest pain from fall

EXAM:
CT CHEST WITHOUT CONTRAST
TECHNIQUE: Multidetector CT imaging of the chest was performed following the
standard protocol without IV contrast.

[Series 201: chest without, idose (2) · axial · non-contrast · 0.78mm/px · z∈[+668,+918]mm · 10 of 118 slices shown, 13 images]
[im 9/118  mediastinal]
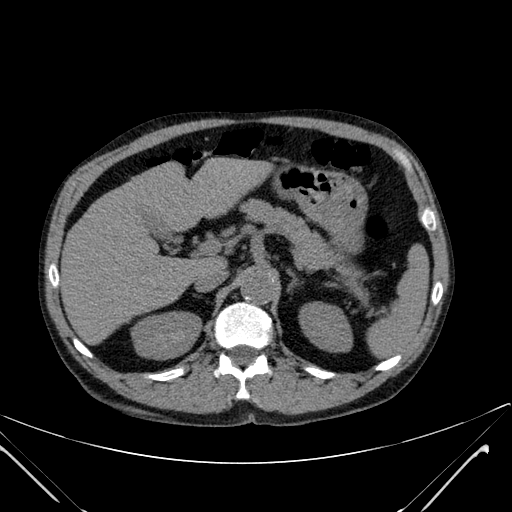
[im 9/118  lung]
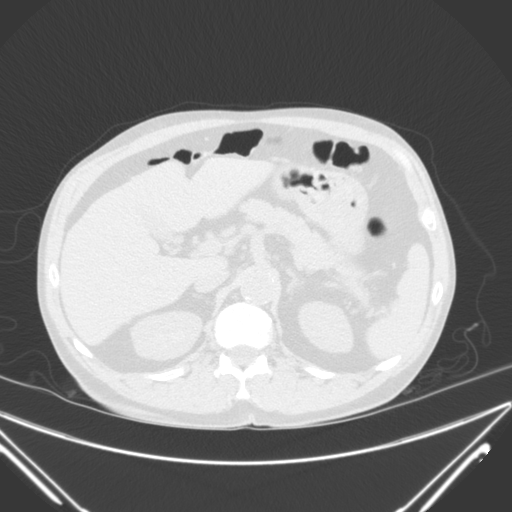
[im 18/118  lung]
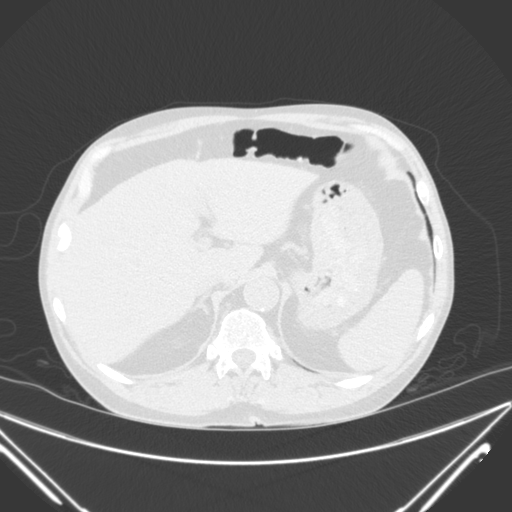
[im 31/118  lung]
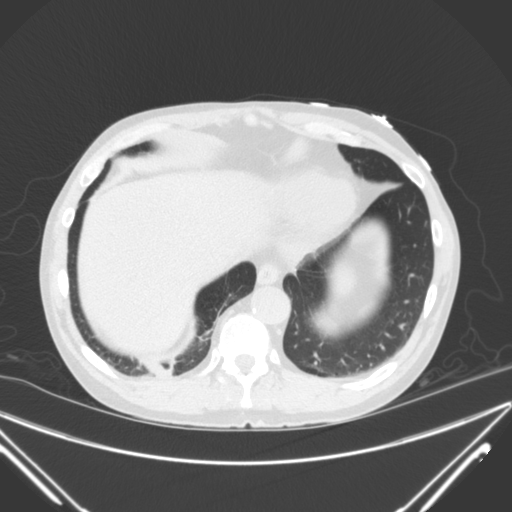
[im 44/118  lung]
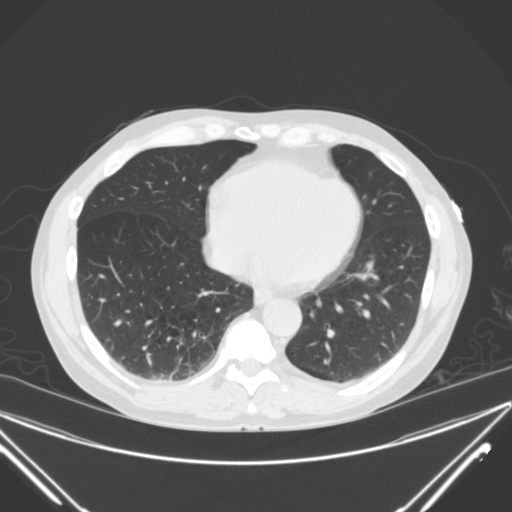
[im 53/118  mediastinal]
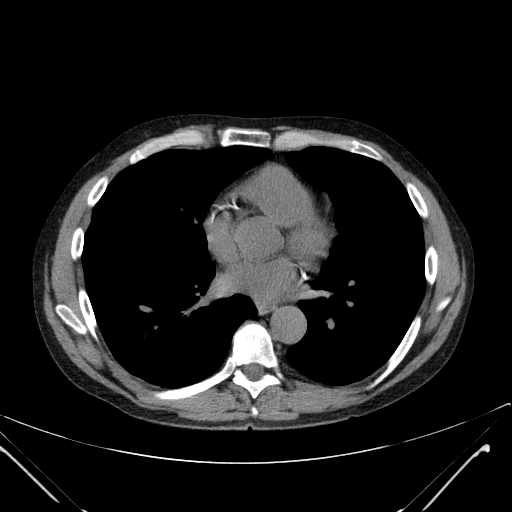
[im 53/118  lung]
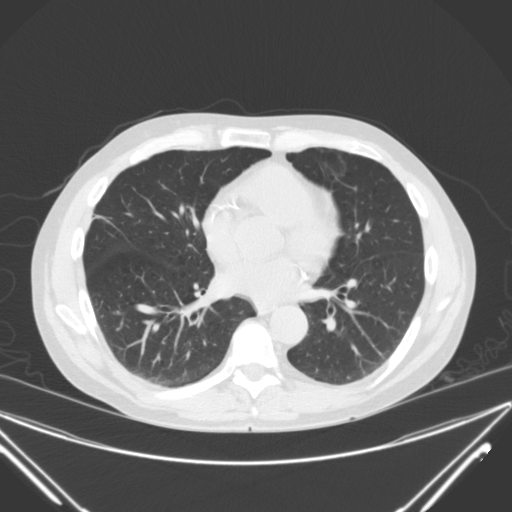
[im 66/118  lung]
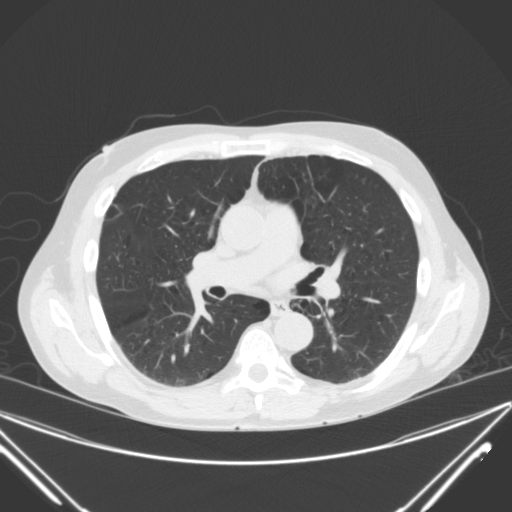
[im 74/118  lung]
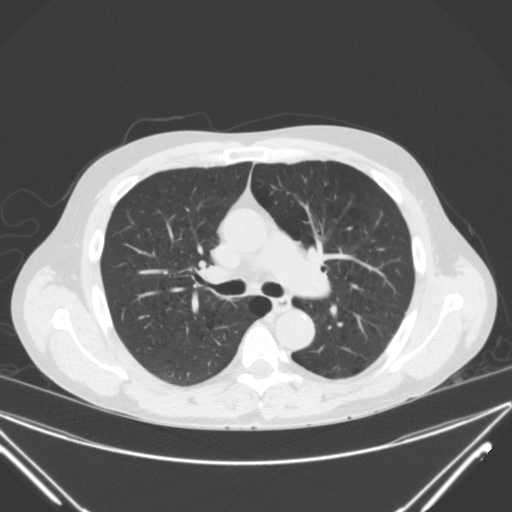
[im 87/118  lung]
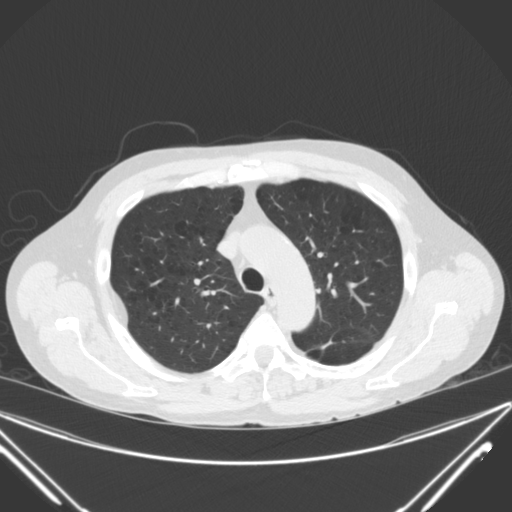
[im 100/118  mediastinal]
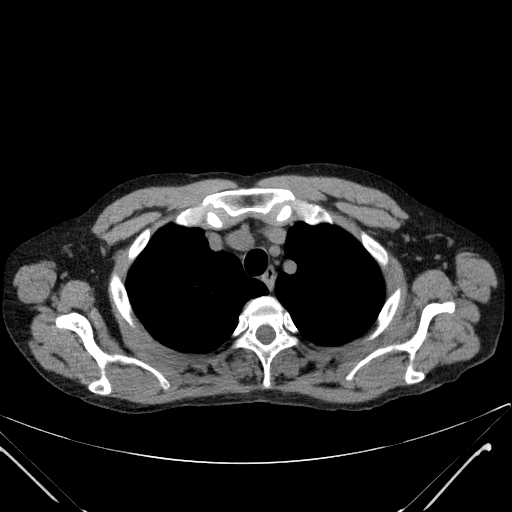
[im 100/118  lung]
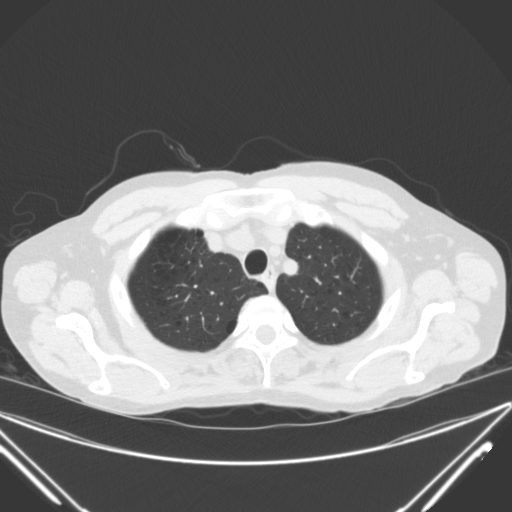
[im 109/118  lung]
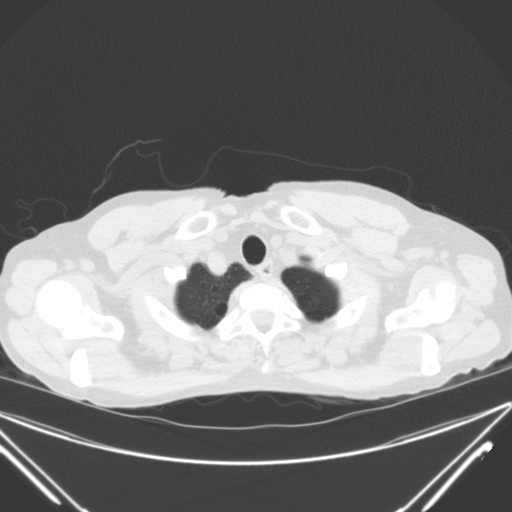

[Series 203: coronal, idose (2) · coronal · 0.45mm/px · 3 of 127 slices shown]
[im 26/127  lung]
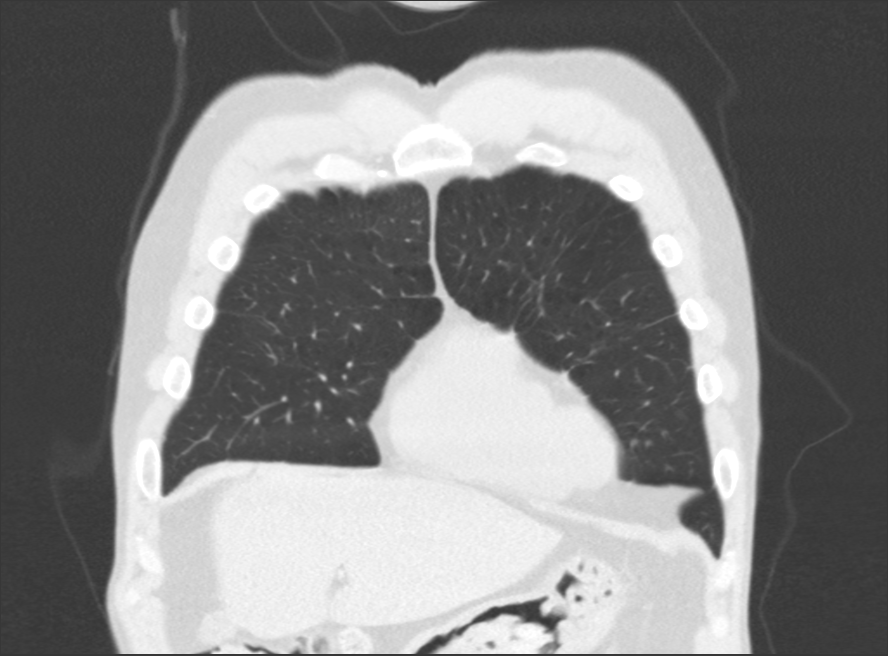
[im 51/127  lung]
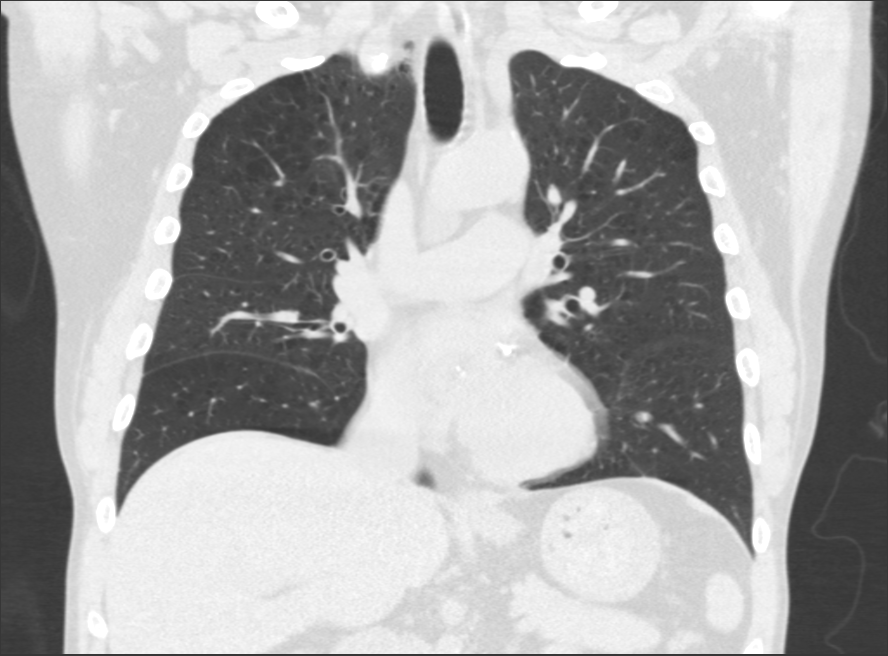
[im 76/127  lung]
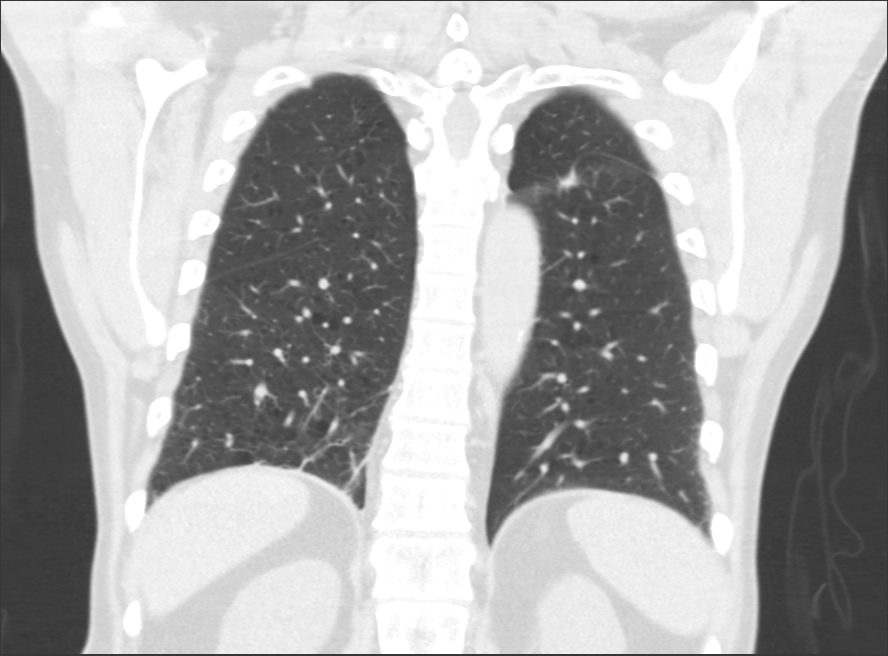

[13 of 36 positions shown; findings below may reference images not displayed]

FINDINGS: Cardiovascular: Limited evaluation without the presence of
intravenous contrast. Atherosclerosis of the aorta. No aneurysmal
dilatation. Coronary artery calcifications. Normal heart size. No
pericardial effusion.

Mediastinum/Nodes: Subcentimeter nonspecific lymph nodes unchanged.
Trachea and mainstem bronchi appear normal. Thyroid unremarkable.
Esophagus within normal limits.

Lungs/Pleura: Moderate emphysematous disease within the bilateral
lungs. Probable scarring along the left pulmonary fissure. No acute
infiltrate effusion or pneumothorax is visualized. There is
atelectasis or scar in the right lower lobe. Small extrapleural
lipoma right upper lobe.

Upper Abdomen: No acute abnormality.

Musculoskeletal: There is a subacute fracture of the left
posteromedial eighth rib at its articulation with the vertebral
body.
IMPRESSION: 1. Subacute fracture of the left posteromedial eighth rib. No
pneumothorax or pleural effusion.
2. Moderate emphysematous disease.  No acute pulmonary infiltrates.

## 2018-02-02 IMAGING — CR DG HAND COMPLETE 3+V*R*
3 series · 3 of 3 positions shown · non-contrast
Comparison: 06/16/2015

CLINICAL DATA: Fall with pain

EXAM:
RIGHT HAND - COMPLETE 3+ VIEW

[hand pa]
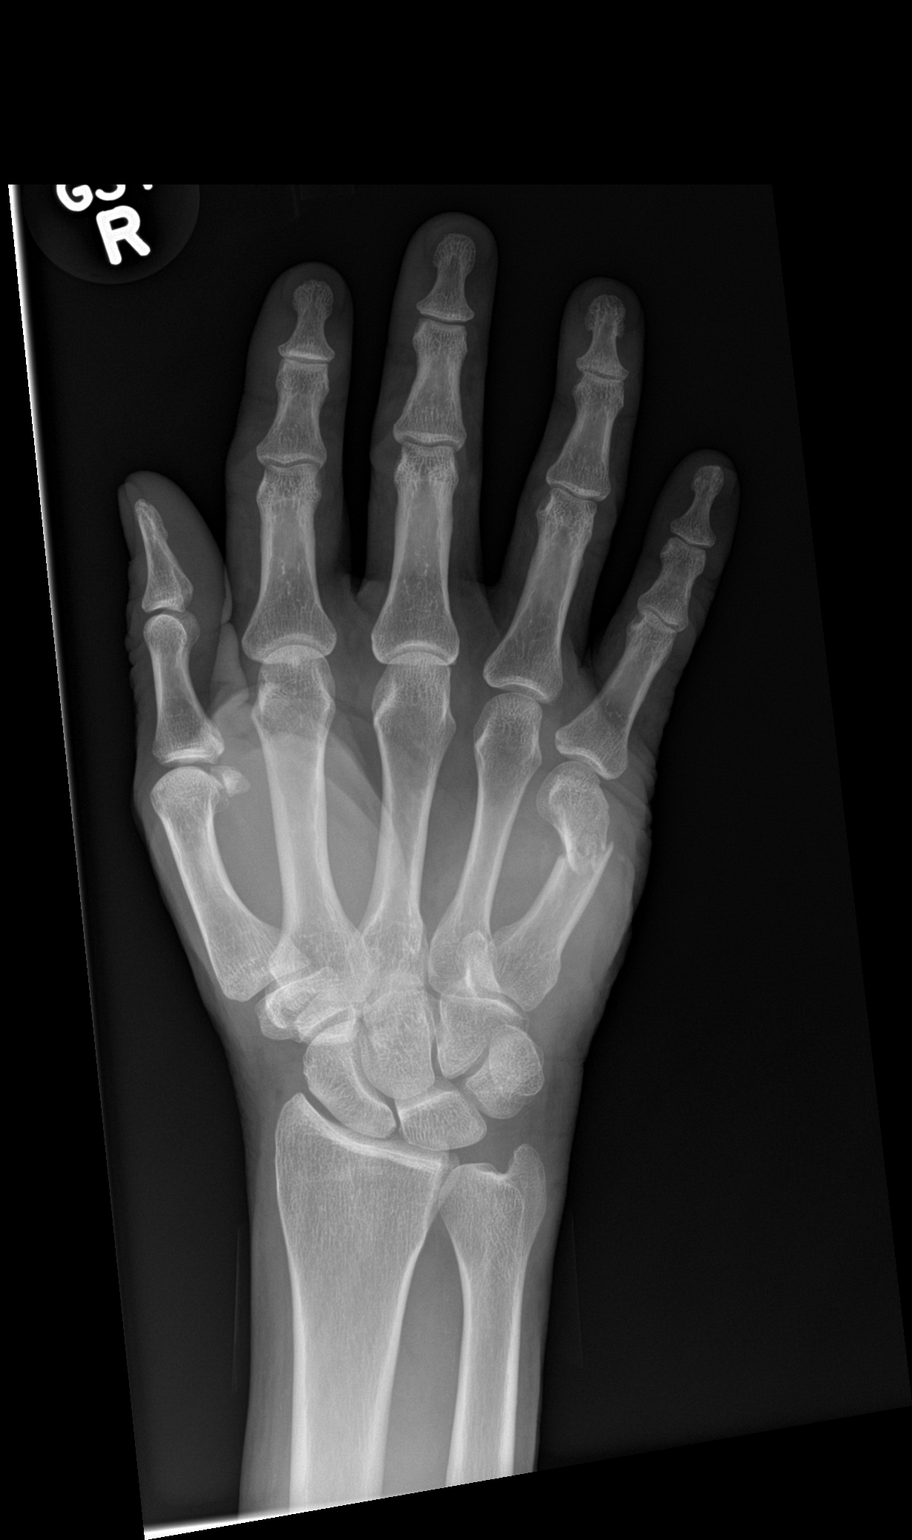

[hand obl]
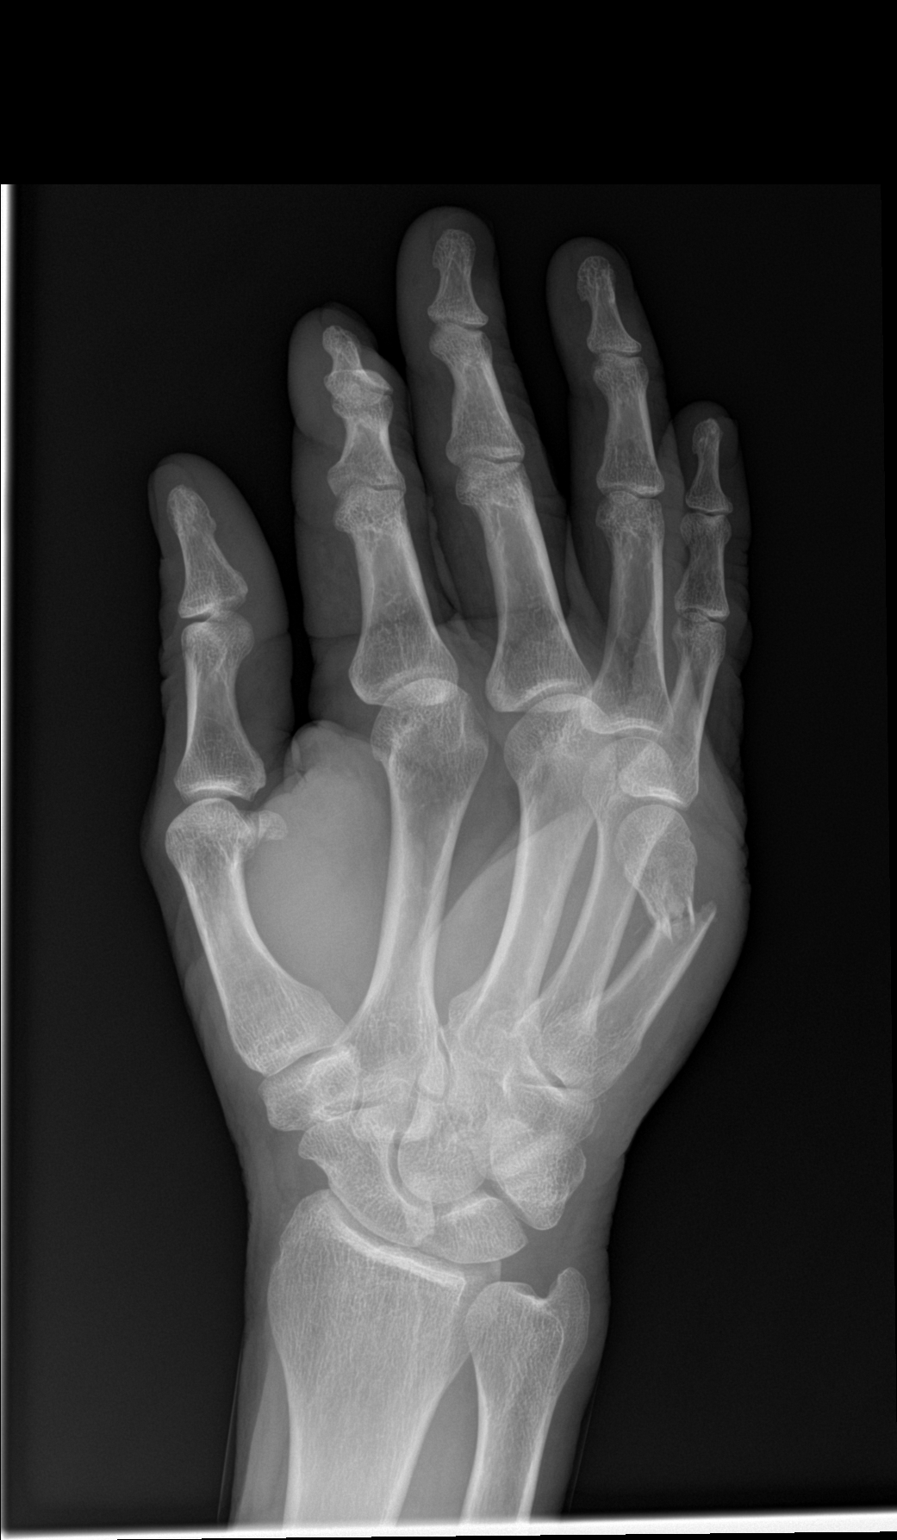

[hand lat]
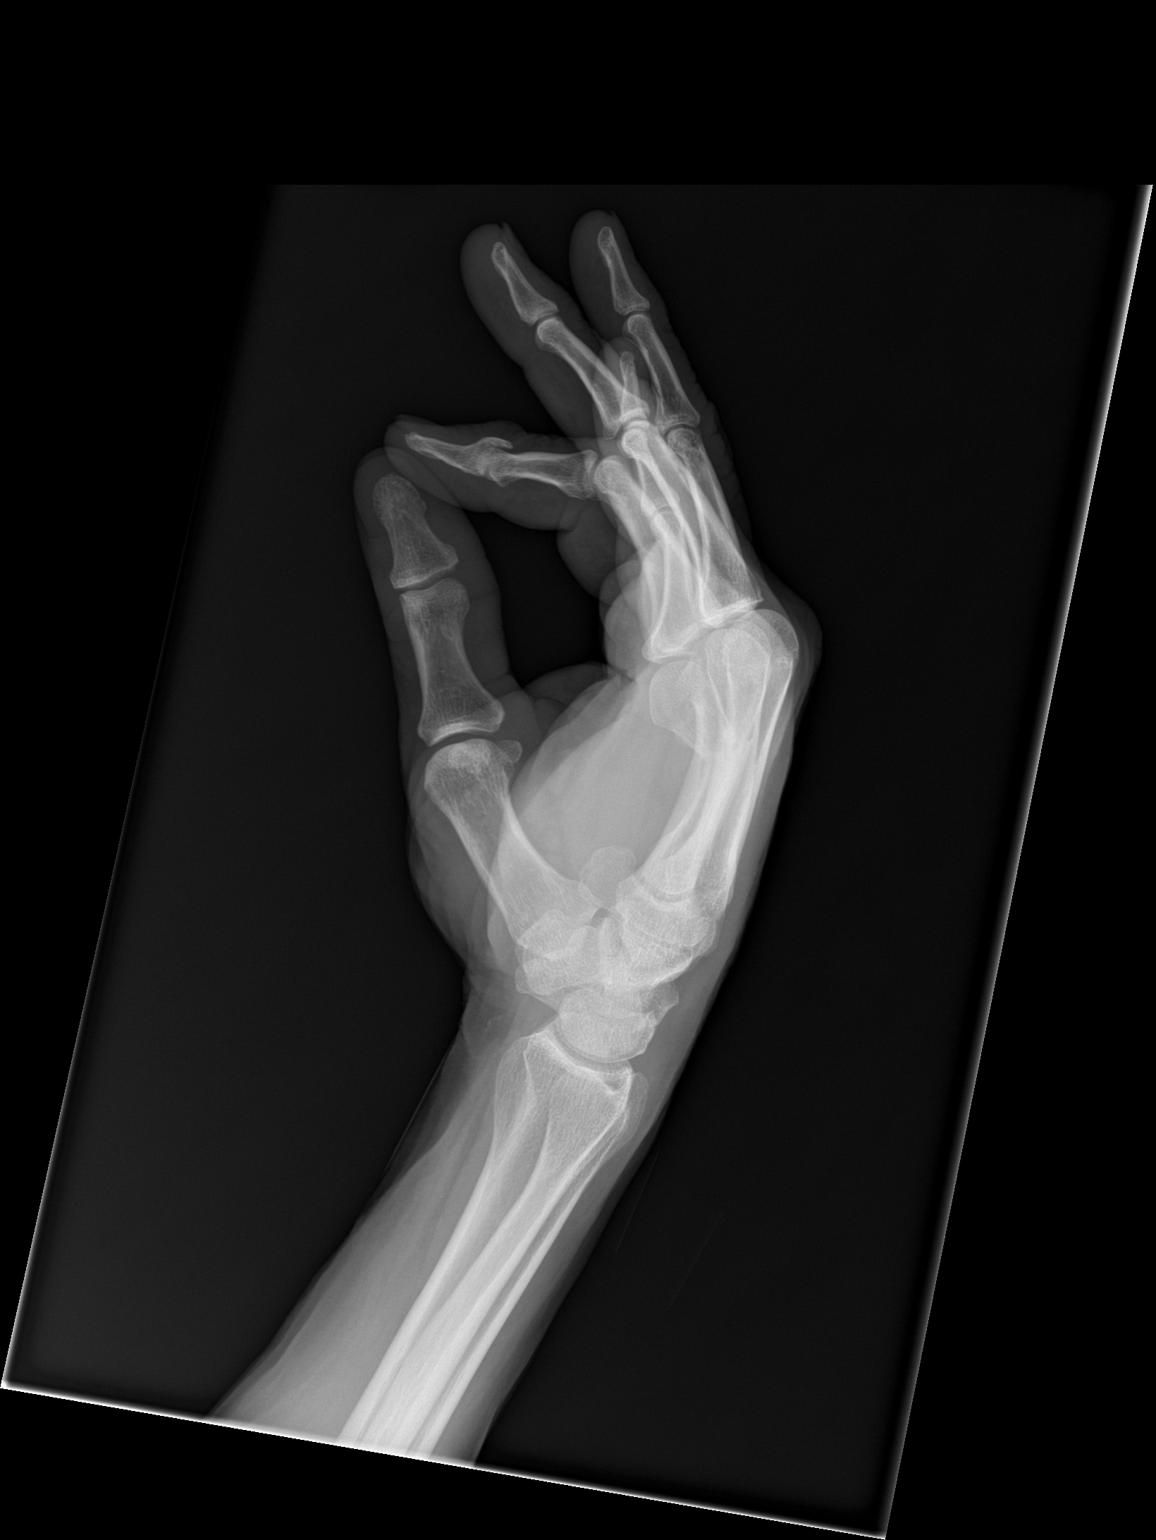

[3 of 3 positions shown; findings below may reference images not displayed]

FINDINGS: There is an acute fracture involving the distal shaft of the fifth
metacarpal with moderate palmar angulation of distal fracture
fragment. No subluxation.
IMPRESSION: Acute angulated fracture of the distal fifth metacarpal.

## 2018-03-29 DIAGNOSIS — R55 Syncope and collapse: Secondary | ICD-10-CM | POA: Diagnosis not present

## 2018-03-31 HISTORY — PX: TOTAL HIP ARTHROPLASTY: SHX124

## 2018-05-28 ENCOUNTER — Inpatient Hospital Stay (HOSPITAL_COMMUNITY)
Admission: EM | Admit: 2018-05-28 | Discharge: 2018-05-31 | DRG: 377 | Disposition: A | Discharge status: Skilled Nursing Facility | Payer: Medicaid Other | Attending: Family Medicine | Admitting: Family Medicine

## 2018-05-28 ENCOUNTER — Emergency Department (HOSPITAL_COMMUNITY): Payer: Medicaid Other

## 2018-05-28 ENCOUNTER — Other Ambulatory Visit (HOSPITAL_COMMUNITY): Payer: Self-pay

## 2018-05-28 ENCOUNTER — Encounter (HOSPITAL_COMMUNITY): Payer: Self-pay | Admitting: Emergency Medicine

## 2018-05-28 ENCOUNTER — Inpatient Hospital Stay (HOSPITAL_COMMUNITY): Payer: Medicaid Other

## 2018-05-28 ENCOUNTER — Other Ambulatory Visit: Payer: Self-pay

## 2018-05-28 DIAGNOSIS — J189 Pneumonia, unspecified organism: Secondary | ICD-10-CM | POA: Diagnosis not present

## 2018-05-28 DIAGNOSIS — R338 Other retention of urine: Secondary | ICD-10-CM | POA: Diagnosis not present

## 2018-05-28 DIAGNOSIS — I959 Hypotension, unspecified: Secondary | ICD-10-CM | POA: Diagnosis not present

## 2018-05-28 DIAGNOSIS — K3189 Other diseases of stomach and duodenum: Secondary | ICD-10-CM | POA: Diagnosis present

## 2018-05-28 DIAGNOSIS — K7460 Unspecified cirrhosis of liver: Secondary | ICD-10-CM | POA: Insufficient documentation

## 2018-05-28 DIAGNOSIS — Z881 Allergy status to other antibiotic agents status: Secondary | ICD-10-CM

## 2018-05-28 DIAGNOSIS — K922 Gastrointestinal hemorrhage, unspecified: Secondary | ICD-10-CM

## 2018-05-28 DIAGNOSIS — Z86711 Personal history of pulmonary embolism: Secondary | ICD-10-CM

## 2018-05-28 DIAGNOSIS — Z86718 Personal history of other venous thrombosis and embolism: Secondary | ICD-10-CM

## 2018-05-28 DIAGNOSIS — R339 Retention of urine, unspecified: Secondary | ICD-10-CM | POA: Diagnosis present

## 2018-05-28 DIAGNOSIS — K269 Duodenal ulcer, unspecified as acute or chronic, without hemorrhage or perforation: Secondary | ICD-10-CM

## 2018-05-28 DIAGNOSIS — M545 Low back pain: Secondary | ICD-10-CM | POA: Diagnosis present

## 2018-05-28 DIAGNOSIS — F419 Anxiety disorder, unspecified: Secondary | ICD-10-CM | POA: Diagnosis present

## 2018-05-28 DIAGNOSIS — K264 Chronic or unspecified duodenal ulcer with hemorrhage: Principal | ICD-10-CM | POA: Diagnosis present

## 2018-05-28 DIAGNOSIS — Z8505 Personal history of malignant neoplasm of liver: Secondary | ICD-10-CM

## 2018-05-28 DIAGNOSIS — R55 Syncope and collapse: Secondary | ICD-10-CM | POA: Diagnosis not present

## 2018-05-28 DIAGNOSIS — C7802 Secondary malignant neoplasm of left lung: Secondary | ICD-10-CM | POA: Diagnosis present

## 2018-05-28 DIAGNOSIS — T39395A Adverse effect of other nonsteroidal anti-inflammatory drugs [NSAID], initial encounter: Secondary | ICD-10-CM | POA: Diagnosis present

## 2018-05-28 DIAGNOSIS — G8929 Other chronic pain: Secondary | ICD-10-CM | POA: Diagnosis present

## 2018-05-28 DIAGNOSIS — F1410 Cocaine abuse, uncomplicated: Secondary | ICD-10-CM | POA: Diagnosis present

## 2018-05-28 DIAGNOSIS — Y92009 Unspecified place in unspecified non-institutional (private) residence as the place of occurrence of the external cause: Secondary | ICD-10-CM

## 2018-05-28 DIAGNOSIS — K08109 Complete loss of teeth, unspecified cause, unspecified class: Secondary | ICD-10-CM | POA: Diagnosis present

## 2018-05-28 DIAGNOSIS — G894 Chronic pain syndrome: Secondary | ICD-10-CM | POA: Diagnosis not present

## 2018-05-28 DIAGNOSIS — M542 Cervicalgia: Secondary | ICD-10-CM | POA: Diagnosis present

## 2018-05-28 DIAGNOSIS — E538 Deficiency of other specified B group vitamins: Secondary | ICD-10-CM | POA: Diagnosis present

## 2018-05-28 DIAGNOSIS — Z91048 Other nonmedicinal substance allergy status: Secondary | ICD-10-CM

## 2018-05-28 DIAGNOSIS — I951 Orthostatic hypotension: Secondary | ICD-10-CM | POA: Diagnosis not present

## 2018-05-28 DIAGNOSIS — K2980 Duodenitis without bleeding: Secondary | ICD-10-CM | POA: Diagnosis present

## 2018-05-28 DIAGNOSIS — E039 Hypothyroidism, unspecified: Secondary | ICD-10-CM | POA: Diagnosis present

## 2018-05-28 DIAGNOSIS — F1210 Cannabis abuse, uncomplicated: Secondary | ICD-10-CM | POA: Diagnosis present

## 2018-05-28 DIAGNOSIS — A419 Sepsis, unspecified organism: Secondary | ICD-10-CM | POA: Diagnosis not present

## 2018-05-28 DIAGNOSIS — F1010 Alcohol abuse, uncomplicated: Secondary | ICD-10-CM | POA: Diagnosis present

## 2018-05-28 DIAGNOSIS — Z79899 Other long term (current) drug therapy: Secondary | ICD-10-CM

## 2018-05-28 DIAGNOSIS — D62 Acute posthemorrhagic anemia: Secondary | ICD-10-CM | POA: Diagnosis present

## 2018-05-28 DIAGNOSIS — K449 Diaphragmatic hernia without obstruction or gangrene: Secondary | ICD-10-CM | POA: Diagnosis present

## 2018-05-28 DIAGNOSIS — Z880 Allergy status to penicillin: Secondary | ICD-10-CM

## 2018-05-28 DIAGNOSIS — Z9981 Dependence on supplemental oxygen: Secondary | ICD-10-CM

## 2018-05-28 DIAGNOSIS — B1920 Unspecified viral hepatitis C without hepatic coma: Secondary | ICD-10-CM | POA: Diagnosis present

## 2018-05-28 DIAGNOSIS — K219 Gastro-esophageal reflux disease without esophagitis: Secondary | ICD-10-CM | POA: Diagnosis present

## 2018-05-28 DIAGNOSIS — R195 Other fecal abnormalities: Secondary | ICD-10-CM

## 2018-05-28 DIAGNOSIS — J439 Emphysema, unspecified: Secondary | ICD-10-CM | POA: Diagnosis present

## 2018-05-28 DIAGNOSIS — J181 Lobar pneumonia, unspecified organism: Secondary | ICD-10-CM | POA: Diagnosis not present

## 2018-05-28 DIAGNOSIS — I9589 Other hypotension: Secondary | ICD-10-CM

## 2018-05-28 DIAGNOSIS — Z79890 Hormone replacement therapy (postmenopausal): Secondary | ICD-10-CM

## 2018-05-28 DIAGNOSIS — I252 Old myocardial infarction: Secondary | ICD-10-CM

## 2018-05-28 DIAGNOSIS — Y95 Nosocomial condition: Secondary | ICD-10-CM | POA: Diagnosis not present

## 2018-05-28 DIAGNOSIS — R1013 Epigastric pain: Secondary | ICD-10-CM | POA: Diagnosis not present

## 2018-05-28 DIAGNOSIS — Z85038 Personal history of other malignant neoplasm of large intestine: Secondary | ICD-10-CM

## 2018-05-28 DIAGNOSIS — J9621 Acute and chronic respiratory failure with hypoxia: Secondary | ICD-10-CM | POA: Diagnosis not present

## 2018-05-28 DIAGNOSIS — Z8673 Personal history of transient ischemic attack (TIA), and cerebral infarction without residual deficits: Secondary | ICD-10-CM

## 2018-05-28 DIAGNOSIS — K7210 Chronic hepatic failure without coma: Secondary | ICD-10-CM | POA: Diagnosis present

## 2018-05-28 DIAGNOSIS — D649 Anemia, unspecified: Secondary | ICD-10-CM

## 2018-05-28 DIAGNOSIS — I10 Essential (primary) hypertension: Secondary | ICD-10-CM | POA: Diagnosis present

## 2018-05-28 DIAGNOSIS — B1710 Acute hepatitis C without hepatic coma: Secondary | ICD-10-CM | POA: Diagnosis not present

## 2018-05-28 DIAGNOSIS — R509 Fever, unspecified: Secondary | ICD-10-CM | POA: Diagnosis not present

## 2018-05-28 DIAGNOSIS — Z885 Allergy status to narcotic agent status: Secondary | ICD-10-CM

## 2018-05-28 DIAGNOSIS — K921 Melena: Secondary | ICD-10-CM | POA: Diagnosis present

## 2018-05-28 DIAGNOSIS — Z7901 Long term (current) use of anticoagulants: Secondary | ICD-10-CM

## 2018-05-28 DIAGNOSIS — K2970 Gastritis, unspecified, without bleeding: Secondary | ICD-10-CM | POA: Diagnosis present

## 2018-05-28 DIAGNOSIS — Z8546 Personal history of malignant neoplasm of prostate: Secondary | ICD-10-CM

## 2018-05-28 DIAGNOSIS — F17210 Nicotine dependence, cigarettes, uncomplicated: Secondary | ICD-10-CM | POA: Diagnosis present

## 2018-05-28 HISTORY — DX: Enterocolitis due to Clostridium difficile, not specified as recurrent: A04.72

## 2018-05-28 LAB — PREPARE RBC (CROSSMATCH)

## 2018-05-28 LAB — CBC
HCT: 27.9 % — ABNORMAL LOW (ref 39.0–52.0)
Hemoglobin: 8.7 g/dL — ABNORMAL LOW (ref 13.0–17.0)
MCH: 28.5 pg (ref 26.0–34.0)
MCHC: 31.2 g/dL (ref 30.0–36.0)
MCV: 91.5 fL (ref 78.0–100.0)
Platelets: 487 10*3/uL — ABNORMAL HIGH (ref 150–400)
RBC: 3.05 MIL/uL — ABNORMAL LOW (ref 4.22–5.81)
RDW: 15.3 % (ref 11.5–15.5)
WBC: 6.7 10*3/uL (ref 4.0–10.5)

## 2018-05-28 LAB — BASIC METABOLIC PANEL
ANION GAP: 10 (ref 5–15)
BUN: 28 mg/dL — ABNORMAL HIGH (ref 6–20)
CALCIUM: 9 mg/dL (ref 8.9–10.3)
CO2: 23 mmol/L (ref 22–32)
Chloride: 108 mmol/L (ref 98–111)
Creatinine, Ser: 0.99 mg/dL (ref 0.61–1.24)
GFR calc Af Amer: >60 mL/min (ref >60)
GLUCOSE: 89 mg/dL (ref 70–99)
Potassium: 4.5 mmol/L (ref 3.5–5.1)
SODIUM: 141 mmol/L (ref 135–145)

## 2018-05-28 LAB — I-STAT TROPONIN, ED: Troponin i, poc: 0 ng/mL (ref 0.00–0.08)

## 2018-05-28 LAB — POC OCCULT BLOOD, ED: Fecal Occult Bld: POSITIVE — AB

## 2018-05-28 LAB — PROTIME-INR
INR: 1.76
PROTHROMBIN TIME: 20.4 s — AB (ref 11.4–15.2)

## 2018-05-28 LAB — ABO/RH: ABO/RH(D): O NEG

## 2018-05-28 LAB — MRSA PCR SCREENING: MRSA BY PCR: NEGATIVE

## 2018-05-28 MED ORDER — ONDANSETRON HCL 4 MG PO TABS: 4.0000 mg | ORAL_TABLET | Freq: Four times a day (QID) | ORAL | Status: DC | PRN

## 2018-05-28 MED ORDER — CIPROFLOXACIN IN D5W 400 MG/200ML IV SOLN
400.0000 mg | Freq: Two times a day (BID) | INTRAVENOUS | Status: DC
  Administered 2018-05-28: 400 mg via INTRAVENOUS
  Filled 2018-05-28 (×2): qty 200

## 2018-05-28 MED ORDER — CYCLOBENZAPRINE HCL 10 MG PO TABS
10.0000 mg | ORAL_TABLET | Freq: Three times a day (TID) | ORAL | Status: DC | PRN
  Administered 2018-05-29 – 2018-05-31 (×4): 10 mg via ORAL
  Filled 2018-05-28 (×4): qty 1

## 2018-05-28 MED ORDER — LEVOTHYROXINE SODIUM 50 MCG PO TABS
50.0000 ug | ORAL_TABLET | Freq: Every day | ORAL | Status: DC
  Administered 2018-05-29 – 2018-05-31 (×3): 50 ug via ORAL
  Filled 2018-05-28 (×3): qty 1

## 2018-05-28 MED ORDER — ACETAMINOPHEN 325 MG PO TABS
650.0000 mg | ORAL_TABLET | Freq: Four times a day (QID) | ORAL | Status: DC | PRN
  Administered 2018-05-28 – 2018-05-30 (×2): 650 mg via ORAL
  Filled 2018-05-28 (×2): qty 2

## 2018-05-28 MED ORDER — DULOXETINE HCL 60 MG PO CPEP
60.0000 mg | ORAL_CAPSULE | Freq: Two times a day (BID) | ORAL | Status: DC
  Administered 2018-05-28 – 2018-05-31 (×6): 60 mg via ORAL
  Filled 2018-05-28 (×6): qty 1

## 2018-05-28 MED ORDER — SODIUM CHLORIDE 0.9 % IV BOLUS: 1000.0000 mL | Freq: Once | INTRAVENOUS | Status: DC

## 2018-05-28 MED ORDER — ONDANSETRON HCL 4 MG/2ML IJ SOLN: 4.0000 mg | Freq: Four times a day (QID) | INTRAMUSCULAR | Status: DC | PRN

## 2018-05-28 MED ORDER — OXYCODONE-ACETAMINOPHEN 5-325 MG PO TABS
1.0000 | ORAL_TABLET | Freq: Four times a day (QID) | ORAL | Status: DC | PRN
  Administered 2018-05-28 – 2018-05-31 (×10): 1 via ORAL
  Filled 2018-05-28 (×12): qty 1

## 2018-05-28 MED ORDER — SODIUM CHLORIDE 0.9 % IV SOLN
INTRAVENOUS | Status: AC
  Administered 2018-05-28: 20:00:00 via INTRAVENOUS

## 2018-05-28 MED ORDER — GABAPENTIN 400 MG PO CAPS
400.0000 mg | ORAL_CAPSULE | Freq: Three times a day (TID) | ORAL | Status: DC
  Administered 2018-05-28 – 2018-05-31 (×8): 400 mg via ORAL
  Filled 2018-05-28 (×8): qty 1

## 2018-05-28 MED ORDER — NICOTINE 7 MG/24HR TD PT24
7.0000 mg | MEDICATED_PATCH | Freq: Every day | TRANSDERMAL | Status: DC
  Administered 2018-05-28 – 2018-05-31 (×4): 7 mg via TRANSDERMAL
  Filled 2018-05-28 (×4): qty 1

## 2018-05-28 MED ORDER — TAMSULOSIN HCL 0.4 MG PO CAPS
0.4000 mg | ORAL_CAPSULE | Freq: Two times a day (BID) | ORAL | Status: DC
  Administered 2018-05-28 – 2018-05-31 (×6): 0.4 mg via ORAL
  Filled 2018-05-28 (×6): qty 1

## 2018-05-28 MED ORDER — SODIUM CHLORIDE 0.9 % IV SOLN
10.0000 mL/h | Freq: Once | INTRAVENOUS | Status: AC
  Administered 2018-05-28: 10 mL/h via INTRAVENOUS

## 2018-05-28 MED ORDER — SODIUM CHLORIDE 0.9 % IV SOLN
80.0000 mg | Freq: Once | INTRAVENOUS | Status: AC
  Administered 2018-05-28: 80 mg via INTRAVENOUS
  Filled 2018-05-28 (×2): qty 80

## 2018-05-28 MED ORDER — ACETAMINOPHEN 650 MG RE SUPP: 650.0000 mg | Freq: Four times a day (QID) | RECTAL | Status: DC | PRN

## 2018-05-28 MED ORDER — HYDROMORPHONE HCL 1 MG/ML IJ SOLN: 0.5000 mg | Freq: Once | INTRAMUSCULAR | Status: DC

## 2018-05-28 MED ORDER — SODIUM CHLORIDE 0.9 % IV SOLN
8.0000 mg/h | INTRAVENOUS | Status: DC
  Administered 2018-05-28: 8 mg/h via INTRAVENOUS
  Filled 2018-05-28 (×4): qty 80

## 2018-05-28 MED ORDER — OCTREOTIDE ACETATE 50 MCG/ML IJ SOLN
50.0000 ug | Freq: Once | INTRAMUSCULAR | Status: DC
  Filled 2018-05-28: qty 1

## 2018-05-28 MED ORDER — ALBUTEROL SULFATE (2.5 MG/3ML) 0.083% IN NEBU: 3.0000 mL | INHALATION_SOLUTION | Freq: Four times a day (QID) | RESPIRATORY_TRACT | Status: DC | PRN

## 2018-05-28 MED ORDER — OCTREOTIDE LOAD VIA INFUSION: 50.0000 ug | Freq: Once | INTRAVENOUS | Status: DC

## 2018-05-28 MED ORDER — SODIUM CHLORIDE 0.9% IV SOLUTION
Freq: Once | INTRAVENOUS | Status: AC
  Administered 2018-05-28: 23:00:00 via INTRAVENOUS

## 2018-05-28 NOTE — ED Provider Notes (Signed)
[facility] EMERGENCY DEPARTMENT Provider Note   CSN: [id number] Arrival date & time: 05/28/18  1134     History   Chief Complaint Chief Complaint  Patient presents with  . Back Pain  . Loss of Consciousness    HPI Robert [name] is a 57 y.o. male.  Patient c/o chronic low back and neck pain, hx ddd. States takes pain meds for same. States for past few weeks feels faint/lightheaded when stands. States usually he will stand against wall and slide down so as to avoid hurting self. Pt is very difficult historian - level 5 caveat. Denies head injury. No headache. No new numbness or weakness to extremity. No fever or chills. States compliant w home meds including anticoagulant therapy - denies any abnormal bruising or bleeding.   The history is provided by the patient and the spouse.  Back Pain   Pertinent negatives include no chest pain, no fever, no numbness, no headaches, no abdominal pain and no weakness.  Loss of Consciousness   Associated symptoms include back pain. Pertinent negatives include abdominal pain, chest pain, confusion, fever, headaches, vomiting and weakness.    Past Medical History:  Diagnosis Date  . Anxiety   . Arthritis    "I'm eat up w/it"  . Asthma   . B12 deficiency    "give myself shots"  . Chronic liver failure ([place])   . Chronic lower back pain   . COPD (chronic obstructive pulmonary disease) ([place])   . Deep vein thrombosis (HCC)    "several"  . Depression   . GERD (gastroesophageal reflux disease)   . Hepatitis C   . History of blood transfusion 2013   "related to kidneys shutting down"  . Hypertension   . Hypothyroidism   . Intermittent self-catheterization of bladder   . Liver cancer ([place])   . Metastatic lung cancer    "left"  . Migraines    "2-3/day" (05/27/2014)  . Myocardial infarction ([place]) 2007; ~ 2011  . Oxygen dependent    3L; 24/7" (05/27/2014)  . Partial small bowel obstruction ([place])   . Pneumonia    "several times"  . Pulmonary emboli (HCC)    "several"  . Shortness of breath    "all the time"  . Stroke [facility]) ~ 03/2012   "couldn't use my left hand for ~ 6 months" (05/27/2014)  . Stroke [facility]) 05/25/2014   "not able to use my left hand again" (05/27/2014)    Patient Active Problem List   Diagnosis Date Noted  . Disturbance of skin sensation 05/29/2014  . Cellulitis 05/27/2014  . Cellulitis of left hand 05/27/2014  . Chronically on opiate therapy 05/27/2014  . Chronic anticoagulation 05/27/2014  . COPD (chronic obstructive pulmonary disease) ([place]) 04/17/2012  . Leg pain, right 04/17/2012  . Low back pain 04/17/2012  . Hepatitis C 04/17/2012  . Adynamic ileus ([place]) 04/17/2012  . Liver cancer ([place])   . Lung cancer ([place])   . Pulmonary emboli [facility])     Past Surgical History:  Procedure Laterality Date  . INCISION AND DRAINAGE ABSCESS Left 05/28/2014   Procedure: INCISION AND DRAINAGE ABSCESS LEFT WRIST;  Surgeon: Leanora Cover, MD;  Location: [facility];  Service: Orthopedics;  Laterality: Left;  . KNEE SURGERY Left ~ 1972   "cut top of my kneecap off"  . MULTIPLE TOOTH EXTRACTIONS  11/2010   "26"  . TONSILLECTOMY AND ADENOIDECTOMY  1974  . TRANSURETHRAL RESECTION OF PROSTATE  2012; 2014  Home Medications    Prior to Admission medications   Medication Sig Start Date End Date Taking? Authorizing Provider  albuterol (PROVENTIL HFA;VENTOLIN HFA) 108 (90 BASE) MCG/ACT inhaler Inhale 1-2 puffs into the lungs every 6 (six) hours as needed for wheezing or shortness of breath. 09/20/14   Quintella Reichert, MD  cyclobenzaprine (FLEXERIL) 10 MG tablet Take 1 tablet (10 mg total) by mouth 3 (three) times daily as needed for muscle spasms. 01/08/17   [name], MD  DULoxetine (CYMBALTA) 60 MG capsule Take 1 capsule (60 mg total) by mouth 2 (two) times daily. 06/07/14   Veryl Speak, MD  gabapentin (NEURONTIN) 400 MG capsule Take 1 capsule (400 mg total) by mouth 3 (three) times daily.  06/07/14   Veryl Speak, MD  ibuprofen (ADVIL,MOTRIN) 400 MG tablet Take 1 tablet (400 mg total) by mouth every 8 (eight) hours as needed. 01/08/17   [name], MD  levothyroxine (SYNTHROID, LEVOTHROID) 50 MCG tablet Take 1 tablet (50 mcg total) by mouth daily before breakfast. 06/07/14   Veryl Speak, MD  lidocaine (LIDODERM) 5 % Place 1 patch onto the skin daily. Remove & Discard patch within 12 hours or as directed by MD Patient not taking: Reported on 01/08/2017 10/13/14   Orpah Greek, MD  naproxen (NAPROSYN) 500 MG tablet Take 1 tablet (500 mg total) by mouth 2 (two) times daily. 11/07/16   Horton, Barbette Hair, MD  omeprazole (PRILOSEC) 20 MG capsule Take 1 capsule (20 mg total) by mouth 2 (two) times daily. 06/07/14   Veryl Speak, MD  oxyCODONE (ROXICODONE) 5 MG immediate release tablet Take 1 tablet (5 mg total) by mouth every 6 (six) hours as needed for severe pain. Patient not taking: Reported on 01/08/2017 10/02/16   Lorayne Bender, PA-C  oxyCODONE-acetaminophen (PERCOCET/ROXICET) 5-325 MG per tablet Take 1 tablet by mouth every 6 (six) hours as needed for moderate pain or severe pain. Patient not taking: Reported on 01/08/2017 09/20/14   Quintella Reichert, MD  Rivaroxaban (XARELTO) 15 MG TABS tablet Take 15 mg by mouth 2 (two) times daily with a meal. Patient states he is taking the 15mg  once a day verified with the patient     [provider]  tamsulosin (FLOMAX) 0.4 MG CAPS capsule Take 1 capsule (0.4 mg total) by mouth at bedtime. Patient taking differently: Take 0.4 mg by mouth 2 (two) times daily.  06/07/14   Veryl Speak, MD    Family History Family History  Problem Relation Age of Onset  . Cancer Mother   . Cancer Paternal Aunt   . Cancer Maternal Aunt     Social History Social History   Tobacco Use  . Smoking status: Current Every Day Smoker    Packs/day: 0.50    Years: 41.00    Pack years: 20.50    Types: Cigarettes  . Smokeless tobacco: Former Dance movement psychotherapist Use Topics  . Alcohol use: Yes    Comment: 8 drinks a week  . Drug use: No     Allergies   Penicillins and Morphine and related   Review of Systems Review of Systems  Constitutional: Negative for fever.  HENT: Negative for sore throat.   Eyes: Negative for redness.  Respiratory: Negative for shortness of breath.   Cardiovascular: Positive for syncope. Negative for chest pain.  Gastrointestinal: Negative for abdominal pain, blood in stool and vomiting.  Genitourinary: Negative for flank pain and hematuria.  Musculoskeletal: Positive for back pain and neck pain.  Skin: Negative for rash.  Neurological: Negative for weakness, numbness and headaches.  Hematological:       Denies abnormal bruising or bleeding.   Psychiatric/Behavioral: Negative for confusion.     Physical Exam Updated Vital Signs BP 99/60 (BP Location: Left Arm)   Pulse (!) 119   Temp 98.4 F (36.9 C) (Oral)   Resp 18   Ht 1.702 m (5\' 7" )   Wt 68 kg   SpO2 100%   BMI 23.49 kg/m   Physical Exam  Constitutional: He appears well-developed and well-nourished.  HENT:  Head: Atraumatic.  Mouth/Throat: Oropharynx is clear and moist.  Eyes: Pupils are equal, round, and reactive to light. Conjunctivae are normal.  Neck: Neck supple. No tracheal deviation present.  Cardiovascular: Normal rate, regular rhythm, normal heart sounds and intact distal pulses. Exam reveals no gallop and no friction rub.  No murmur heard. Pulmonary/Chest: Effort normal and breath sounds normal. No accessory muscle usage. No respiratory distress.  Abdominal: Soft. Bowel sounds are normal. He exhibits no distension. There is no tenderness.  No pulsatile mass.   Genitourinary:  Genitourinary Comments: No cva tenderness. Black stool. Heme positive.   Musculoskeletal: He exhibits no edema.  Neurological: He is alert.  Speech clear/fluent. Motor/sens grossly intact bil.   Skin: Skin is warm and dry. No rash noted.   Psychiatric: He has a normal mood and affect.  Nursing note and vitals reviewed.    ED Treatments / Results  Labs (all labs ordered are listed, but only abnormal results are displayed) Results for orders placed or performed during the hospital encounter of 22/29/79  Basic metabolic panel  Result Value Ref Range   Sodium 141 135 - 145 mmol/L   Potassium 4.5 3.5 - 5.1 mmol/L   Chloride 108 98 - 111 mmol/L   CO2 23 22 - 32 mmol/L   Glucose, Bld 89 70 - 99 mg/dL   BUN 28 (H) 6 - 20 mg/dL   Creatinine, Ser 0.99 0.61 - 1.24 mg/dL   Calcium 9.0 8.9 - 10.3 mg/dL   GFR calc non Af Amer >60 >60 mL/min   GFR calc Af Amer >60 >60 mL/min   Anion gap 10 5 - 15  CBC  Result Value Ref Range   WBC 6.7 4.0 - 10.5 K/uL   RBC 3.05 (L) 4.22 - 5.81 MIL/uL   Hemoglobin 8.7 (L) 13.0 - 17.0 g/dL   HCT 27.9 (L) 39.0 - 52.0 %   MCV 91.5 78.0 - 100.0 fL   MCH 28.5 26.0 - 34.0 pg   MCHC 31.2 30.0 - 36.0 g/dL   RDW 15.3 11.5 - 15.5 %   Platelets 487 (H) 150 - 400 K/uL  I-stat troponin, ED  Result Value Ref Range   Troponin i, poc 0.00 0.00 - 0.08 ng/mL   Comment 3          POC occult blood, ED Provider will collect  Result Value Ref Range   Fecal Occult Bld POSITIVE (A) NEGATIVE   EKG EKG Interpretation  Date/Time:  Wednesday May 28 2018 11:45:57 EDT Ventricular Rate:  124 PR Interval:  114 QRS Duration: 74 QT Interval:  328 QTC Calculation: 471 R Axis:   77 Text Interpretation:  Sinus tachycardia Otherwise normal ECG Confirmed by Lajean Saver [phone]) on 05/28/2018 12:06:00 PM   Radiology Dg Chest 2 View  Result Date: 05/28/2018 CLINICAL DATA:  Chest pain and syncopal episode EXAM: CHEST - 2 VIEW COMPARISON:  01/07/2017 FINDINGS: Cardiac shadow is  stable. Lungs are mildly hyperinflated bilaterally without focal infiltrate or sizable effusion. No acute bony abnormality is seen. IMPRESSION: Mild COPD without acute abnormality. Electronically Signed   By: Inez Catalina M.D.   On:  05/28/2018 13:19    Procedures Procedures (including critical care time)  Medications Ordered in ED Medications  HYDROmorphone (DILAUDID) injection 0.5 mg (has no administration in time range)  sodium chloride 0.9 % bolus 1,000 mL (has no administration in time range)     Initial Impression / Assessment and Plan / ED Course  I have reviewed the triage vital signs and the nursing notes.  Pertinent labs & imaging results that were available during my care of the patient were reviewed by me and considered in my medical decision making (see chart for details).  Iv ns. Labs.   Reviewed nursing notes and prior charts for additional history.   Labs reviewed-  hgb is 8.7 today, and bun elevated - prior hgb 11.4 8 days ago - an on re-entering room pt taking 3 of his own naprosyn - all c/w gi bleeding. Stool is heme pos, black in color.   protonix 80 mg iv. Iv ns bolus. Given low bp and tachycardia, will transfuse.   Transfuse 2 units prbc.  Recheck bp improved.   Medical team consulted for admission.  CRITICAL CARE  RE: syncope, acute gi bleeding likely related to nsaid use, hypotension, prbc transfusion Performed by: Mirna Mires Total critical care time: 40 minutes Critical care time was exclusive of separately billable procedures and treating other patients. Critical care was necessary to treat or prevent imminent or life-threatening deterioration. Critical care was time spent personally by me on the following activities: development of treatment plan with patient and/or surrogate as well as nursing, discussions with consultants, evaluation of patient's response to treatment, examination of patient, obtaining history from patient or surrogate, ordering and performing treatments and interventions, ordering and review of laboratory studies, ordering and review of radiographic studies, pulse oximetry and re-evaluation of patient's condition.     Final Clinical Impressions(s) / ED  Diagnoses   Final diagnoses:  None    ED Discharge Orders    None       Lajean Saver, MD 05/28/18 1429
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
## 2018-05-28 NOTE — Progress Notes (Signed)
Pt with first unit of PRBCs infusing when MD discontinued order for 2 units of blood. Called provider who stated, they didn't want to discontinue to blood but in fact wanted pt to receive 2 units of blood transfusions. Called blood bank who stated to document on blood admin what provider said. RN to finish infusing unit of blood. Will continue to monitor pt.

## 2018-05-28 NOTE — Plan of Care (Signed)
  Problem: Education: Goal: Ability to identify signs and symptoms of gastrointestinal bleeding will improve Outcome: Progressing   

## 2018-05-28 NOTE — Progress Notes (Signed)
Pt asking for pain medication upon arrival to unit from ED. Paged provider on call for family medicine. Awaiting further orders. Will continue to monitor pt.
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
## 2018-05-28 NOTE — Progress Notes (Signed)
Dear Doctor: Robert [name] or PCP This patient has been identified as a candidate for PICC for the following reason (s): IV therapy over 48 hours and poor veins/poor circulatory system (CHF, COPD, emphysema, diabetes, steroid use, IV drug abuse, etc.)  Pt. Has very poor veins needing several IVRN using ultrasound to obtain PIV.  If you agree, please write an order for the indicated device. For any questions contact the Vascular Access Team at [phone] if no answer, please leave a message.  Thank you for supporting the early vascular access assessment program.

## 2018-05-29 ENCOUNTER — Encounter (HOSPITAL_COMMUNITY): Admission: EM | Disposition: A | Discharge status: Skilled Nursing Facility | Payer: Self-pay | Source: Home / Self Care

## 2018-05-29 ENCOUNTER — Inpatient Hospital Stay (HOSPITAL_COMMUNITY): Payer: Medicaid Other

## 2018-05-29 ENCOUNTER — Inpatient Hospital Stay (HOSPITAL_COMMUNITY): Payer: Medicaid Other | Admitting: Anesthesiology

## 2018-05-29 ENCOUNTER — Encounter (HOSPITAL_COMMUNITY): Payer: Self-pay | Admitting: *Deleted

## 2018-05-29 ENCOUNTER — Telehealth: Payer: Self-pay

## 2018-05-29 DIAGNOSIS — K3189 Other diseases of stomach and duodenum: Secondary | ICD-10-CM

## 2018-05-29 DIAGNOSIS — K7460 Unspecified cirrhosis of liver: Secondary | ICD-10-CM

## 2018-05-29 DIAGNOSIS — R55 Syncope and collapse: Secondary | ICD-10-CM

## 2018-05-29 DIAGNOSIS — K449 Diaphragmatic hernia without obstruction or gangrene: Secondary | ICD-10-CM

## 2018-05-29 DIAGNOSIS — G894 Chronic pain syndrome: Secondary | ICD-10-CM

## 2018-05-29 DIAGNOSIS — K2970 Gastritis, unspecified, without bleeding: Secondary | ICD-10-CM

## 2018-05-29 HISTORY — PX: ESOPHAGOGASTRODUODENOSCOPY (EGD) WITH PROPOFOL: SHX5813

## 2018-05-29 HISTORY — PX: BIOPSY: SHX5522

## 2018-05-29 LAB — CBC
HEMATOCRIT: 28.3 % — AB (ref 39.0–52.0)
HEMOGLOBIN: 9.3 g/dL — AB (ref 13.0–17.0)
MCH: 28.8 pg (ref 26.0–34.0)
MCHC: 32.9 g/dL (ref 30.0–36.0)
MCV: 87.6 fL (ref 78.0–100.0)
Platelets: 418 10*3/uL — ABNORMAL HIGH (ref 150–400)
RBC: 3.23 MIL/uL — AB (ref 4.22–5.81)
RDW: 15.8 % — ABNORMAL HIGH (ref 11.5–15.5)
WBC: 6.7 10*3/uL (ref 4.0–10.5)

## 2018-05-29 LAB — URINALYSIS, ROUTINE W REFLEX MICROSCOPIC
Bilirubin Urine: NEGATIVE
GLUCOSE, UA: NEGATIVE mg/dL
HGB URINE DIPSTICK: NEGATIVE
Ketones, ur: NEGATIVE mg/dL
LEUKOCYTES UA: NEGATIVE
Nitrite: NEGATIVE
PROTEIN: NEGATIVE mg/dL
SPECIFIC GRAVITY, URINE: 1.015 (ref 1.005–1.030)
pH: 6 (ref 5.0–8.0)

## 2018-05-29 LAB — RAPID URINE DRUG SCREEN, HOSP PERFORMED
Amphetamines: NOT DETECTED
BARBITURATES: NOT DETECTED
BENZODIAZEPINES: NOT DETECTED
COCAINE: NOT DETECTED
OPIATES: NOT DETECTED
TETRAHYDROCANNABINOL: NOT DETECTED

## 2018-05-29 LAB — HIV ANTIBODY (ROUTINE TESTING W REFLEX): HIV Screen 4th Generation wRfx: NONREACTIVE

## 2018-05-29 LAB — COMPREHENSIVE METABOLIC PANEL
ALBUMIN: 2.6 g/dL — AB (ref 3.5–5.0)
ALT: 22 U/L (ref 0–44)
ANION GAP: 6 (ref 5–15)
AST: 29 U/L (ref 15–41)
Alkaline Phosphatase: 45 U/L (ref 38–126)
BILIRUBIN TOTAL: 1.4 mg/dL — AB (ref 0.3–1.2)
BUN: 25 mg/dL — ABNORMAL HIGH (ref 6–20)
CO2: 23 mmol/L (ref 22–32)
Calcium: 8.3 mg/dL — ABNORMAL LOW (ref 8.9–10.3)
Chloride: 109 mmol/L (ref 98–111)
Creatinine, Ser: 1.12 mg/dL (ref 0.61–1.24)
Glucose, Bld: 80 mg/dL (ref 70–99)
Potassium: 4.3 mmol/L (ref 3.5–5.1)
Sodium: 138 mmol/L (ref 135–145)
TOTAL PROTEIN: 5 g/dL — AB (ref 6.5–8.1)

## 2018-05-29 LAB — IRON AND TIBC
Iron: 242 ug/dL — ABNORMAL HIGH (ref 45–182)
SATURATION RATIOS: 90 % — AB (ref 17.9–39.5)
TIBC: 270 ug/dL (ref 250–450)
UIBC: 28 ug/dL

## 2018-05-29 LAB — ECHOCARDIOGRAM COMPLETE
Height: 67 in
WEIGHTICAEL: 2400 [oz_av]

## 2018-05-29 LAB — RETICULOCYTES
RBC.: 3.23 MIL/uL — AB (ref 4.22–5.81)
RETIC CT PCT: 2.9 % (ref 0.4–3.1)
Retic Count, Absolute: 93.7 10*3/uL (ref 19.0–186.0)

## 2018-05-29 LAB — FERRITIN: FERRITIN: 61 ng/mL (ref 24–336)

## 2018-05-29 LAB — FOLATE: Folate: 22.9 ng/mL (ref >5.9)

## 2018-05-29 LAB — VITAMIN B12: VITAMIN B 12: 223 pg/mL (ref 180–914)

## 2018-05-29 SURGERY — ESOPHAGOGASTRODUODENOSCOPY (EGD) WITH PROPOFOL
Anesthesia: Monitor Anesthesia Care

## 2018-05-29 MED ORDER — PROPOFOL 10 MG/ML IV BOLUS
INTRAVENOUS | Status: DC | PRN
  Administered 2018-05-29: 100 mg via INTRAVENOUS
  Administered 2018-05-29: 20 mg via INTRAVENOUS

## 2018-05-29 MED ORDER — PANTOPRAZOLE SODIUM 40 MG PO TBEC
40.0000 mg | DELAYED_RELEASE_TABLET | Freq: Two times a day (BID) | ORAL | Status: DC
  Administered 2018-05-29 – 2018-05-31 (×5): 40 mg via ORAL
  Filled 2018-05-29 (×5): qty 1

## 2018-05-29 MED ORDER — LACTATED RINGERS IV SOLN
INTRAVENOUS | Status: DC | PRN
  Administered 2018-05-29: 08:00:00 via INTRAVENOUS

## 2018-05-29 MED ORDER — DIPHENHYDRAMINE HCL 50 MG/ML IJ SOLN
12.5000 mg | Freq: Four times a day (QID) | INTRAMUSCULAR | Status: DC | PRN
  Administered 2018-05-29: 12.5 mg via INTRAVENOUS
  Filled 2018-05-29: qty 1

## 2018-05-29 MED ORDER — PROPOFOL 500 MG/50ML IV EMUL
INTRAVENOUS | Status: DC | PRN
  Administered 2018-05-29: 75 ug/kg/min via INTRAVENOUS

## 2018-05-29 MED ORDER — LIDOCAINE HCL (CARDIAC) PF 100 MG/5ML IV SOSY
PREFILLED_SYRINGE | INTRAVENOUS | Status: DC | PRN
  Administered 2018-05-29: 20 mg via INTRATRACHEAL

## 2018-05-29 MED ORDER — LACTATED RINGERS IV SOLN
INTRAVENOUS | Status: AC | PRN
  Administered 2018-05-29: 1000 mL via INTRAVENOUS

## 2018-05-29 SURGICAL SUPPLY — 14 items

## 2018-05-29 NOTE — Discharge Summary (Signed)
[facility] Discharge Summary  Patient name: Robert [name] Medical record number: [id number] Date of birth: Nov 04, 1960 Age: 57 y.o. Gender: male Date of Admission: 05/28/2018  Date of Discharge:05/31/18 Admitting Physician: Alveda Reasons, MD  Primary Care Provider: Patient, No Pcp Per Consultants: GI   Indication for Hospitalization: symptomatic anemia with acute GI bleed   Discharge Diagnoses/Problem List:  Acute GI Bleed with syncopal episode  Chronic Anemia Acute Urinary retention  Emphysema/COPD  Chronic Neck & Back Pain  Hypothyroidism  Tobacco Use  History of Polysubstance Abuse  History of PE on Xarelto   Disposition: to SNF  Discharge Condition: stable, improved   Physical Exam  Constitutional: He is oriented to person, place, and time.  HENT:  Mouth/Throat: No oropharyngeal exudate.  Eyes: Pupils are equal, round, and reactive to light. EOM are normal. Right eye exhibits no discharge. Left eye exhibits no discharge. No scleral icterus.  Neck: No thyromegaly present.  Chronic neck pain with decreased range of motion 2/2 to pain   Cardiovascular: Normal rate, regular rhythm, normal heart sounds and intact distal pulses. Exam reveals no gallop and no friction rub.  No murmur heard. Pulmonary/Chest: No stridor.  Abdominal: Soft. Bowel sounds are normal. He exhibits no distension and no mass. There is tenderness. There is no rebound.  Musculoskeletal: He exhibits tenderness.  Lymphadenopathy:    He has no cervical adenopathy.  Neurological: He is oriented to person, place, and time.  Skin: Skin is warm and dry. No rash noted. No erythema.  Vitals reviewed.  Brief Hospital Course:  Robert [name] was admitted on 05/27/18 after presenting with symptomatic anemia and syncopal episode that was likely 2/2 to acute GI bleed in the setting of NSAID use. Patient reported taking up to 6 NSAID capsules per day which likely contributed to his  bleeding episode.  Upon presenting to the ED, he was found to have a Hbg of 8.7 from previous 11.8. In the ED, vitals were notable for tachycardia to 119 and hypotension as low as 81/56 for which he was administered 2 liters of normal saline followed by 2 units of pRBCs. His post transfusion Hgb was 9.3. The patient was also started on IV Ciprofloxacin for prophylaxis in a patient with acute GI bleed with potential cirrhosis given his history of HepC. He was unable to tolerate this antibiotic and it was subsequently discontinued. He was also started on Octreotide IV and Protonix 80mg  gtt for protection in case of esophageal varices given his history of alcohol abuse which was later converted to IV and PO respectively. EGD revealed a non-bleeding duodenal ulcer and no evidence of esophageal bleeding.  Cardiac echo results showed LV EF: 60% -  65%.  Pathology report from the EGD biopsy pending.   Robert [name] reported not urinating for 2 days on the date of admission so he underwent a bladder scan and was found to have 230mL of urine in his bladder but was able to urinate soon after with a PVR of 19mL. He continued his home dose of Flomax 0.4mg  daily and had good urine output throughout rest of admission, foley was not needed.  Patient stable for discharge from GI standpoint with follow up appointment scheduled.  He had an ambulation trial on RA with desaturation, so will be discharged on 2 L of supplemental oxygen.  Patient was medically stable at time of discharge to SNF.    Issues for Follow Up:  1. Please obtain CBC to check Hgb  2. UOP, ensure patient is not retaining urine (has history of this)   Significant Procedures:   -EGD 8/29   Significant Labs and Imaging:  Recent Labs  Lab 05/28/18 1155 05/29/18 0443 05/30/18 0246  WBC 6.7 6.7 6.0  HGB 8.7* 9.3* 9.4*  HCT 27.9* 28.3* 29.5*  PLT 487* 418* 422*   Recent Labs  Lab 05/28/18 1155 05/29/18 0443 05/30/18 0246  NA 141 138 139  K 4.5 4.3  3.8  CL 108 109 108  CO2 23 23 24   GLUCOSE 89 80 119*  BUN 28* 25* 15  CREATININE 0.99 1.12 1.05  CALCIUM 9.0 8.3* 8.4*  ALKPHOS  --  45  --   AST  --  29  --   ALT  --  22  --   ALBUMIN  --  2.6*  --    Results/Tests Pending at Time of Discharge:  EGD 05/29/18 pathology from biopsy pending   Discharge Medications:  Allergies as of 05/30/2018      Reactions   Ciprofloxacin Hives   Penicillins Itching   Has patient had a PCN reaction causing immediate rash, facial/tongue/throat swelling, SOB or lightheadedness with hypotension: YES Has patient had a PCN reaction causing severe rash involving mucus membranes or skin necrosis: NO Has patient had a PCN reaction that required hospitalization NO Has patient had a PCN reaction occurring within the last 10 years: NO If all of the above answers are "NO", then may proceed with Cephalosporin use.   Adhesive  [tape] Rash   Morphine And Related Itching, Rash      Medication List    STOP taking these medications   ibuprofen 400 MG tablet Commonly known as:  ADVIL,MOTRIN   naproxen 500 MG tablet Commonly known as:  NAPROSYN     TAKE these medications   albuterol 108 (90 Base) MCG/ACT inhaler Commonly known as:  PROVENTIL HFA;VENTOLIN HFA Inhale 1-2 puffs into the lungs every 6 (six) hours as needed for wheezing or shortness of breath.   COMBIVENT RESPIMAT 20-100 MCG/ACT Aers respimat Generic drug:  Ipratropium-Albuterol Inhale 1 puff into the lungs every 6 (six) hours.   cyclobenzaprine 10 MG tablet Commonly known as:  FLEXERIL Take 1 tablet (10 mg total) by mouth 3 (three) times daily as needed for muscle spasms.   diclofenac sodium 1 % Gel Commonly known as:  VOLTAREN Apply 2 g topically 4 (four) times daily.   DULoxetine 60 MG capsule Commonly known as:  CYMBALTA Take 1 capsule (60 mg total) by mouth 2 (two) times daily.   FERROUS SULFATE IRON 200 (65 Fe) MG Tabs Generic drug:  Ferrous Sulfate Dried Take 1 tablet by  mouth at bedtime.   gabapentin 300 MG capsule Commonly known as:  NEURONTIN Take 600 mg by mouth 3 (three) times daily.   gabapentin 400 MG capsule Commonly known as:  NEURONTIN Take 1 capsule (400 mg total) by mouth 3 (three) times daily.   levothyroxine 50 MCG tablet Commonly known as:  SYNTHROID, LEVOTHROID Take 1 tablet (50 mcg total) by mouth daily before breakfast.   lidocaine 5 % Commonly known as:  LIDODERM Place 1 patch onto the skin daily. Remove & Discard patch within 12 hours or as directed by MD   methocarbamol 750 MG tablet Commonly known as:  ROBAXIN Take 750 mg by mouth 3 (three) times daily as needed for pain.   omeprazole 20 MG capsule Commonly known as:  PRILOSEC Take 1 capsule (20 mg total) by mouth  2 (two) times daily. What changed:  when to take this   oxyCODONE 5 MG immediate release tablet Commonly known as:  Oxy IR/ROXICODONE Take 1 tablet (5 mg total) by mouth every 6 (six) hours as needed for severe pain.   oxyCODONE-acetaminophen 5-325 MG tablet Commonly known as:  PERCOCET/ROXICET Take 1 tablet by mouth every 6 (six) hours as needed for moderate pain or severe pain.   Rivaroxaban 15 MG Tabs tablet Commonly known as:  XARELTO Take 15 mg by mouth 2 (two) times daily with a meal. Patient states he is taking the 15mg  once a day verified with the patient   tamsulosin 0.4 MG Caps capsule Commonly known as:  FLOMAX Take 1 capsule (0.4 mg total) by mouth at bedtime. What changed:  when to take this   tiotropium 18 MCG inhalation capsule Commonly known as:  SPIRIVA Place 18 mcg into inhaler and inhale daily.   vitamin B-12 50 MCG tablet Commonly known as:  CYANOCOBALAMIN Take 200 mcg by mouth daily.            Durable Medical Equipment  (From admission, onward)         Start     Ordered   05/30/18 1547  For home use only DME oxygen  Once    Question Answer Comment  Mode or (Route) Nasal cannula   Liters per Minute 2   Frequency  Continuous (stationary and portable oxygen unit needed)   Oxygen delivery system Gas      05/30/18 1547          Discharge Instructions: Please refer to Patient Instructions section of EMR for full details.  Patient was counseled important signs and symptoms that should prompt return to medical care, changes in medications, dietary instructions, activity restrictions, and follow up appointments.   You were admitted to the hospital for bleeding in your gastrointestinal system that is likely due to taking too many NSAIDs for pain management. Please follow up for an additional endoscopy and colonoscopy in 8 weeks to check for healing of the ulcer found during your endoscopy on 05/29/18. Avoid taking NSAIDS and try to use Tylenol for pain management instead.   Follow-Up Appointments: Follow-up Information    Gerrit Heck V, DO Follow up on 06/25/2018.   Specialty:  Gastroenterology Why:  Appt at 1:30 PM. Please arrive 15 minutes early for appointment.  Contact information: [address] [place]        Garwin Brothers, MD Follow up on 06/11/2018.   Specialty:  Internal Medicine Why:  Follow up with PCP as previously scheduled. Contact information: [place] [address] [phone]           Gastroenterology: Your appointment is scheduled for June 25, 2018 at 1:30 pm.  The office is located at [address] [place], [place], [address].  Lovenia Kim, MD 05/30/2018, 4:04 PM

## 2018-05-29 NOTE — Anesthesia Preprocedure Evaluation (Addendum)
Anesthesia Evaluation  Patient identified by MRN, date of birth, ID band Patient awake    Reviewed: Allergy & Precautions, H&P , NPO status , Patient's Chart, lab work & pertinent test results  History of Anesthesia Complications Negative for: history of anesthetic complications  Airway Mallampati: III  TM Distance: >3 FB Neck ROM: Full  Mouth opening: Limited Mouth Opening  Dental  (+) Edentulous Lower, Edentulous Upper   Pulmonary shortness of breath, asthma , COPD,  oxygen dependent, Current Smoker, PE  Hx lung cancer    breath sounds clear to auscultation       Cardiovascular hypertension, + Past MI and + DVT   Rhythm:Regular Rate:Normal   '16 TTE (Care Everywhere) - Mild concentric left ventricular hypertrophy. EF 60-65%. Abnormal septal motion due to RV dilatation/pressure overload. Mildly dilated right ventricle. Mild TR. Mild-moderate pulmonary hypertension.    Neuro/Psych  Headaches, PSYCHIATRIC DISORDERS Anxiety Depression CVA, Residual Symptoms    GI/Hepatic GERD  Medicated and Controlled,(+) Hepatitis -, C Chronic liver failure    Endo/Other  Hypothyroidism   Renal/GU negative Renal ROS    Intermittent self-catheritization     Musculoskeletal  (+) Arthritis ,   Abdominal   Peds  Hematology  (+) anemia ,   Anesthesia Other Findings   Reproductive/Obstetrics                           Anesthesia Physical Anesthesia Plan  ASA: IV  Anesthesia Plan: MAC   Post-op Pain Management:    Induction: Intravenous  PONV Risk Score and Plan: 1 and Propofol infusion and Treatment may vary due to age or medical condition  Airway Management Planned: Nasal Cannula and Natural Airway  Additional Equipment: None  Intra-op Plan:   Post-operative Plan:   Informed Consent: I have reviewed the patients History and Physical, chart, labs and discussed the procedure including the  risks, benefits and alternatives for the proposed anesthesia with the patient or authorized representative who has indicated his/her understanding and acceptance.   Dental Advisory Given  Plan Discussed with: CRNA and Anesthesiologist  Anesthesia Plan Comments:       Anesthesia Quick Evaluation

## 2018-05-29 NOTE — Progress Notes (Signed)
OT Cancellation    05/29/18 1420  OT Visit Information  Last OT Received On 05/29/18  Reason Eval/Treat Not Completed Patient at procedure or test/ unavailable (working wtih PT)  Maurie Boettcher, OT/L  OT Clinical Specialist [phone]

## 2018-05-29 NOTE — Progress Notes (Addendum)
Family Medicine Teaching Service Daily Progress Note Intern Pager: [phone]  Patient name: Robert Robert Medical record number: [id number] Date of birth: [birth year] Age: 57 y.o. Gender: male  Primary Care Provider: Patient, Robert Robert Per Consultants: GI Code Status: FULL   Pt Overview and Major Events to Date:  Symptomatic anemia, syncopal episdoe 2/2 to acute GI bleed   Assessment and Plan: [name] a 57 y.o.malepresenting with symptomatic anemia in the setting of acute GI bleed. PMH is significant for history of PE (on Xarelto at home), h/o lung, liver andprostate cancer,Hep C, COPD, hypothyroidism, andchronic neck and back pain2/2 DDD.  Symptomatic anemia with syncope, likely 2/2 acute GI bleed  Stable. Hgb on admission 8.7>2 units rbcs>9.4 today. EGD shows small hiatal hernia, gastritis.  Solitary, nonbleeding duodenal ulcer without bleeding stigmata.  Duodenitis with erythema.  Biopsies obtained of the gastritis and duodenitis.  Pathology pending.  Robert evidence for portal hypertensive gastropathy, gastric or esophageal varices.   -GI recs: advance diet as tolerated, protonix BID x2 months,  repeat upper endoscopy in 8 weeks with colonoscopy.   -monitor Hgb   -holding home xarelto; will discuss discontinuing along with risks and benefits with patient   -avoid NSAIDs  -D/c IV Octreotide  -zofran prn  -vitals per unit routine  AcuteUrinary Retention, Resolved Does not seem to remain an active issue at this time.  Spontaneous urine output has been adequate during this admission, daily total 2571mL to date.  -monitor urine output -strict I/Os -continue Flomax 0.4mg    Emphysema/COPD, stable  Patient has history ofmetastatic lung cancer.Smoking history of 3PPD for 45 years and currently smoking one packevery three days. Patient was saturating 100% on room air during exam. On ipratropium-albuterol at home. Patient breathing comfortably on 3L of O2 in  room 05/29/18. On 05/30/18, patient still on 3L overnight    -attempt to wean off of 3L O2 to RA as patient was not on supplemental oxygen at home for 1 year prior to this admission  -if unable to wean oxygen, will complete walk test to  -DuoNebs PRN  -Albuterol PRN q 6 hours  Chronic neck and back pain, Stable   Patient has experienced chronic back and neck pain for years for which he was previously prescribednarcotics.Patient continues to have neck and hip pain with significant right leg pain. Taking 400mg  NeurontinTIDfor nerve painand flexeril 10 mg. -Percocet 5 q 6 hrs PRN  -continueNeurontin400mg  TID  -Flexeril 10mg  TID PRN  -Tylenol 650mg  PRN   Hyperuremia, Resolved BUN elevated on admission to 28up from 5 in 12/2017,likely due to acute bleed. BUN down from 28>25>15 on 05/30/18.   -monitor on daily BMP  Hypothyroidism, Stable  Home on Synthroid 50 mcg QD  -continue home Synthroid 52mcg QD  Tobacco use Smoking history of 3PPD for 45 years and currently smoking onepackevery three days. Nicotine patch offered and pt agreeable.  -7mg  transdermal nicotine patch  History of Polysubstance Abuse  Patient reports prior use of heroin and drinking 1/5 of a pint daily. Patient reports stopping usage 8 months ago after attending rehabilitation.  -Negative UDS on admission.   FEN/GI:regular diet   Prophylaxis:Home xarelto held;SCDs in setting of GI bleed  Disposition:d/c to SNF per OT recommendations pending oxygen wean or passed walk test, pending SNF placement   Subjective:  Overnight, [name] had a low BP of 76/56 for which he was given a bolus of NS and improved to 103/91 with Robert additional events.  Upon interview this morning,  [name] states that he is still experiencing pain in his neck and back due to not having his pain meds because of his hypotension this morning.  He denies fevers, chills, abdominal pain, nausea, vomiting.   Objective: Temp:   [97.4 F (36.3 C)-98.3 F (36.8 C)] 97.7 F (36.5 C) (08/30 1232) Pulse Rate:  [70-75] 74 (08/30 0505) Resp:  [16-18] 18 (08/30 0501) BP: (76-107)/(56-91) 92/62 (08/30 1232) SpO2:  [100 %] 100 % (08/30 0504) on 3Liters [name]   Physical Exam: General: 57 yo male, lying in bed  Cardiovascular: RRR without MRG  Respiratory: decreased breath sounds bilaterally, on 3L O2 per [name]  Abdomen: soft with right upper and lower quadrant tenderness, + BS  Extremities: non-cyanotic, non-edematous   Laboratory: Recent Labs  Lab 05/28/18 1155 05/29/18 0443 05/30/18 0246  WBC 6.7 6.7 6.0  HGB 8.7* 9.3* 9.4*  HCT 27.9* 28.3* 29.5*  PLT 487* 418* 422*   Recent Labs  Lab 05/28/18 1155 05/29/18 0443 05/30/18 0246  NA 141 138 139  K 4.5 4.3 3.8  CL 108 109 108  CO2 23 23 24   BUN 28* 25* 15  CREATININE 0.99 1.12 1.05  CALCIUM 9.0 8.3* 8.4*  PROT  --  5.0*  --   BILITOT  --  1.4*  --   ALKPHOS  --  45  --   ALT  --  22  --   AST  --  29  --   GLUCOSE 89 80 119*   Imaging/Diagnostic Tests:  Echocardiogram 05/29/18 LV EF: 60% -   65%, Moderately dilated right ventricle with normal systoplic   function, normal RVSP.    [name] MS 4 05/30/2018, 1:51 PM Acting Intern pager: [phone]  Patient seen along with MS4 student [name].  I personally evaluated this patient along with the student, and verified all aspects of the history, physical exam, and medical decision making as documented by the student. I agree with the student's documentation and have made all necessary edits.  Lovenia Kim MD  [place], PGY-3

## 2018-05-29 NOTE — Telephone Encounter (Signed)
Cr C, I tried to call him but his voicemail was full.  I did schedule an appointment for him to see you on 06-25-18 in [facility].  I also placed a recall for the Endo/Colon to be scheduled in 8 weeks.  I have sent a letter to him with this information. Pam

## 2018-05-29 NOTE — Progress Notes (Addendum)
Family Medicine Teaching Service Daily Progress Note Acting Intern Pager: [phone]  Patient name: Robert [name] Medical record number: [id number] Date of birth: [birth year] Age: 57 y.o. Gender: male  Primary Care Provider: Patient, No Pcp Per Consultants: GI Code Status: Full Code  Pt Overview and Major Events to Date:  Symptomatic anemia, syncopal episdoe 2/2 to acute GI bleed   Assessment and Plan: Robert [name] is a 57 y.o. male presenting following a symptomatic anemia in the setting of acute GI bleed.   PMH is significant for history of PE (on Xarelto at home), h/o lung, liver and prostate cancer, Hep C, COPD, hypothyroidism, and chronic neck and back pain 2/2 DDD.     Symptomatic anemia with syncope, likely 2/2 acute GI bleed  Patient reports feeling dizzy and lightheaded prior to his syncopal episode. Likely secondary to taking up to 6 tabs of 220 mg Naproxen a day instead of his previous narcotic prescriptions for 1 month.  Also at risk with Xarelto 15 mg daily for h/x of PE.  Hgb on admission 8.7> now 9.3 s/p 2 units of RBCs, retics appropriate at 2.9, INR 1.76, Ferritin 61, Iron elevated at 242.  RUQ U/S showed no evidence of cirrhosis nor active bleeding.  EGD this morning with small hiatal hernia, mild nonerosive antral gastritis, and 44mm nonbleeding ulcer in duodenum. -GI recs: f/u biopsy, clear liquid diet>advance as tolerated tomorrow, continue protonix 40 mg BID x8 weeks, repeat upper endoscopy in 8 weeks with colonoscopy  -monitor Hgb   -holding home xarelto; avoid NSAIDs  -continue po protonix 40 mg BID   -continue IV octreotide for 2 additional days  -Echocardiogram  -zofran prn  -vitals per unit routine  Acute Urinary Retention Overnight bladder scan with 200 cc urine.  Pt spontaneously voided with PVR 0 ml on recheck.  Does not seem to remain an active issue at this time.   -monitor urine output -consider placing foley cath if retaining   -strict  I/Os -continue Flomax 0.4mg    Emphysema/COPD Patient has history of metastatic lung cancer. Smoking history of 3PPD for 45 years and currently smoking one pack every three days. Patient was saturating 100% on room air during exam. On ipratropium-albuterol at home.  Patient breathing comfortably on 3L of O2 in room 05/29/18 -DuoNebs PRN  -Albuterol PRN q 6 hours   Chronic neck and back pain  Stable.  Patient has experienced chronic back and neck pain for years for which he was previously prescribed narcotics.  Previous provider no longer accepting medicaid and he has been off his narcotics for about 1 month. Used to take oxycodone 5 mg Q6 prn and percocet 5-325 Q6 prn.   Has been using NSAIDS instead.  Patient continues to have neck and hip pain with significant right leg pain. Taking 400mg  Neurontin TID for nerve pain and flexeril 10 mg.  -Percocet 5 q 6 hrs PRN  -continue Neurontin 400mg  TID  -Flexeril 10mg  TID PRN  -Tylenol 650mg  PRN    Hyperuremia  Downtrending. BUN elevated on admission to 28 up from 5 in 12/2017, likely due to acute bleed. BUN down from 28 to 25.   -monitor on daily BMP  Hypothyroidism Stable.  Last TSH at home on Synthroid 50 mcg QD  -continue home Synthroid 51mcg QD  Tobacco use Smoking history of 3PPD for 45 years and currently smoking one pack every three days. Nicotine patch offered and pt agreeable.  -7mg  transdermal nicotine patch   History of Polysubstance  Abuse  Patient reports prior use of heroin and drinking 1/5 of a pint daily. Patient reports stopping usage 8 months ago after attending rehabilitation.  Negative UDS on admission.    FEN/GI: Clear liquid diet > ADAT  Prophylaxis: Home xarelto held; SCDs in setting of GI bleed   Disposition: d/c to home pending GI evaluation and cessation of bleeding   Subjective:  Patient was noted to have some low Bps overnight as low as 88/60 that improved to sys in 100s following transfusion.  This  morning patient states he is still in substantial pain due to his back and has some epigastric tenderness with minimal SOB on the oxygen. He is also ready to start eating, denies any emesis, dark stools, fevers, chills, dizziness and has not tried to ambulate as of yet.    Objective: Temp:  [97.3 F (36.3 C)-98.6 F (37 C)] 98.6 F (37 C) (08/29 0844) Pulse Rate:  [72-119] 76 (08/29 0850) Resp:  [8-21] 11 (08/29 0850) BP: (81-128)/(54-83) 95/67 (08/29 0850) SpO2:  [89 %-100 %] 100 % (08/29 0850) Weight:  [68 kg] 68 kg (08/28 1213)   Physical Exam: General: 57 yo male lying in bed Cardiovascular: RRR without murmurs gallops or rubs   Respiratory: CTAB without wheezing  Abdomen: soft with some epigastric tenderness + BS Extremities: non edematous, non-cyanotic Skin: warm and dry   Laboratory: Recent Labs  Lab 05/28/18 1155 05/29/18 0443  WBC 6.7 6.7  HGB 8.7* 9.3*  HCT 27.9* 28.3*  PLT 487* 418*   Recent Labs  Lab 05/28/18 1155 05/29/18 0443  NA 141 138  K 4.5 4.3  CL 108 109  CO2 23 23  BUN 28* 25*  CREATININE 0.99 1.12  CALCIUM 9.0 8.3*  PROT  --  5.0*  BILITOT  --  1.4*  ALKPHOS  --  45  ALT  --  22  AST  --  29  GLUCOSE 89 80    Imaging/Diagnostic Tests: RUQ [place] : unremarkable  CXR: mild COPD without acute process   Pattricia Boss, Medical Student 05/29/2018, 9:49 AM Acting Intern pager: [phone]  Patient seen along with MS3 student [name]. I personally evaluated this patient along with her, and verified all aspects of the history, physical exam, and medical decision making as documented above. I agree with the student's documentation and have made all necessary edits.  Lovenia Kim MD [place] PGY3

## 2018-05-29 NOTE — Progress Notes (Signed)
Patient began to complain of being really itchy few minutes after hanging Cipro. RN immediatly stopped the antibiotic. Patient vitals signs are 104/90, HR 70, 100% O2, and temp is 97.5. Patient does not appear in any distress. Patient stated "the itch is 75% gone." Paged provider. IV Benadryl ordered to be given.

## 2018-05-29 NOTE — Progress Notes (Signed)
  Echocardiogram 2D Echocardiogram has been performed.  Robert [name] 05/29/2018, 2:19 PM

## 2018-05-29 NOTE — Op Note (Addendum)
[facility] Patient Name: Robert [name] Procedure Date : 05/29/2018 MRN: [id number] Attending MD: Gerrit Heck , MD Date of Birth: [birth year] CSN: [id number] Age: 57 Admit Type: Inpatient Procedure:                Upper GI endoscopy Indications:              Epigastric abdominal pain, Acute post hemorrhagic                            anemia, Heme positive stool, Melena                           57 year old male admitted with near syncopal event                            with recent abdominal pain and dark stools and                            admission evaluation notable for FOBT positive                            stools, normocytic anemia. Providers:                Gerrit Heck, MD, Kingsley Plan, RN, Elspeth Cho Tech., Technician, Neldon Newport                            CRNA, CRNA Referring MD:              Medicines:                Monitored Anesthesia Care Complications:            No immediate complications. Estimated Blood Loss:     Estimated blood loss was minimal. Procedure:                Pre-Anesthesia Assessment:                           - Prior to the procedure, a History and Physical                            was performed, and patient medications and                            allergies were reviewed. The patient's tolerance of                            previous anesthesia was also reviewed. The risks                            and benefits of the procedure and the sedation                            options and risks were discussed  with the patient.                            All questions were answered, and informed consent                            was obtained. Prior Anticoagulants: The patient has                            taken Xarelto (rivaroxaban), last dose was 1 day                            prior to procedure. ASA Grade Assessment: IV - A                            patient with severe systemic  disease that is a                            constant threat to life. After reviewing the risks                            and benefits, the patient was deemed in                            satisfactory condition to undergo the procedure.                           After obtaining informed consent, the endoscope was                            passed under direct vision. Throughout the                            procedure, the patient's blood pressure, pulse, and                            oxygen saturations were monitored continuously. The                            GIF-H190 ([id number]) Olympus adult EGD was introduced                            through the mouth, and advanced to the second part                            of duodenum. The upper GI endoscopy was                            accomplished without difficulty. The patient                            tolerated the procedure well. Scope In: Scope Out: Findings:      A small hiatal hernia was present.  The upper third of the esophagus, middle third of the esophagus and       lower third of the esophagus were normal.      Localized mild inflammation characterized by erythema was found in the       gastric antrum. Biopsies were taken with a cold forceps for Helicobacter       pylori testing. Estimated blood loss was minimal.      The cardia, gastric fundus and gastric body were normal.      One non-bleeding cratered duodenal ulcer with no stigmata of bleeding       was found in the duodenal bulb. The lesion was 4 mm in largest       dimension. Biopsies were taken with a cold forceps for histology.       Estimated blood loss was minimal.      Localized mildly erythematous mucosa without active bleeding and with no       stigmata of bleeding was found in the duodenal bulb. Biopsies were taken       with a cold forceps for histology.      The second portion of the duodenum was normal. Impression:               - Small hiatal  hernia.                           - Normal upper third of esophagus, middle third of                            esophagus and lower third of esophagus.                           - Gastritis. Biopsied.                           - Normal cardia, gastric fundus and gastric body.                           - One non-bleeding duodenal ulcer with no stigmata                            of bleeding. Biopsied.                           - Erythematous duodenopathy. Biopsied.                           - Normal second portion of the duodenum. Recommendation:           - Return patient to hospital ward for ongoing care.                           - Clear liquid diet today.                           - Await pathology results.                           - Repeat upper endoscopy in 8 weeks to check  healing.                           - Use Protonix (pantoprazole) 40 mg PO BID for 8                            weeks.                           - Recommend outpatient colonoscopy at appointment                            to be scheduled for routine colon cancer screening.                            This can be coordinated to be done at the time of                            repeat EGD as above. Procedure Code(s):        --- Professional ---                           [phone], Esophagogastroduodenoscopy, flexible,                            transoral; with biopsy, single or multiple Diagnosis Code(s):        --- Professional ---                           K44.9, Diaphragmatic hernia without obstruction or                            gangrene                           K29.70, Gastritis, unspecified, without bleeding                           K26.9, Duodenal ulcer, unspecified as acute or                            chronic, without hemorrhage or perforation                           K31.89, Other diseases of stomach and duodenum                           R10.13, Epigastric pain                            D62, Acute posthemorrhagic anemia                           R19.5, Other fecal abnormalities                           K92.1, Melena (includes Hematochezia) CPT copyright 2017 American  Medical Association. All rights reserved. The codes documented in this report are preliminary and upon coder review may  be revised to meet current compliance requirements. Gerrit Heck, MD 05/29/2018 8:50:18 AM Number of Addenda: 0

## 2018-05-29 NOTE — Telephone Encounter (Signed)
Hi Dr C, His appointment is at 1:30 pm.  His letter went out today but if you need me to change that time I will just send another letter.  Please let me know.  Thanks. Pam
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
## 2018-05-29 NOTE — Care Management Note (Signed)
Case Management Note  Patient Details  Name: Robert [name] MRN: [id number] Date of Birth: 01/21/1961  Subjective/Objective:  Pt admitted on 05/28/18 with symptomatic anemia and presyncope.  PTA, pt resided in a halfway house alone; states he "falls all the time."  He requires assistance with all ADLS.                Action/Plan: PT/OT recommending SNF, and CSW consulted to facilitate dc to SNF upon medical stability.  CM consulted for PCP; pt states he has an appointment with Dr. Garwin Brothers on September 11 ([place]).    Expected Discharge Date:                  Expected Discharge Plan:  Skilled Nursing Facility  In-House Referral:  Clinical Social Work  Discharge planning Services     Post Acute Care Choice:    Choice offered to:     DME Arranged:    DME Agency:     HH Arranged:    [place] Agency:     Status of Service:  In process, will continue to follow  If discussed at Long Length of Stay Meetings, dates discussed:    Additional Comments:  [name], RN, BSN  Trauma/Neuro ICU Case Manager [phone]

## 2018-05-29 NOTE — Progress Notes (Signed)
Pt tolerated clear and full liquids without nausea, pain or distress. Will advance diet as ordered.

## 2018-05-29 NOTE — Progress Notes (Signed)
Pt with a low bp of 88/60. Pt receiving second unit of blood. Paged provider Adrienne Mocha to inform. Per provider, continue to monitor pt. Will continue to monitor pt.

## 2018-05-29 NOTE — Telephone Encounter (Signed)
-----   Message from [place], DO sent at 05/29/2018  9:17 AM EDT ----- Regarding: Inpatient follow-up scope to schedule Can you please provide me a follow-up appointment for this patient for me for 2-4 weeks as well as an appointment for repeat EGD to assess ulcer healing and colonoscopy (same day is fine) for routine CRC screening in 8+ weeks?   Thank you!
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
## 2018-05-29 NOTE — Progress Notes (Signed)
  EGD completed in the endoscopy suite this morning without complications.  Please see endoscopy report in chart for full detail briefly:  - Small hiatal hernia - Mild nonerosive antral gastritis.  This was biopsied.  Otherwise normal stomach - 4 mm clean-based, crater, nonbleeding ulcer in the duodenal bulb.  No high risk stigmata of bleeding.  No endoscopic intervention indicated based on appearance.  This was biopsied.  There is mild erythema noted in the duodenal bulb as well. - Normal portion of the duodenum.  Recommendations: - Return patient to hospital ward for ongoing care.  - Clear liquid diet today and can slowly advance as tolerated tomorrow. - Await pathology results.  - Repeat upper endoscopy in 8 weeks to check healing.  - Use Protonix (pantoprazole) 40 mg PO BID for 8 weeks.  - Recommend outpatient colonoscopy at appointment to be scheduled for routine colon cancer screening. This can be coordinated to be done at the time of repeat EGD as above.   Of note right upper quadrant ultrasound completed overnight and essentially normal without any radiographic evidence of cirrhosis and no duct abnormalities.  EGD today also without endoscopic evidence of portal hypertension (portal hypertensive gastropathy, varices).  Iron studies without evidence of iron deficiency, however these were drawn after PRBC infusion and therefore difficult to interpret.  Labs otherwise with some suggestion of impaired hepatic synthetic function, to include albumin 2.6, T bili 1.4, although etiology for these lab abnormalities certainly multifactorial. INR 1.76, although on Xarelto which can mildly influence INR.  Recommend repeat labs as an outpatient.  Otherwise, as an inpatient and for abundance of caution, can continue octreotide for a total of 2-3 days.  He did not tolerate Cipro overnight and he has a prior penicillin allergy.   Gerrit Heck, DO, [facility]

## 2018-05-29 NOTE — Anesthesia Procedure Notes (Signed)
Procedure Name: MAC Date/Time: 05/29/2018 8:25 AM Performed by: Neldon Newport, CRNA Pre-anesthesia Checklist: Timeout performed, Patient being monitored, Suction available, Emergency Drugs available and Patient identified Patient Re-evaluated:Patient Re-evaluated prior to induction Oxygen Delivery Method: Nasal cannula

## 2018-05-29 NOTE — Anesthesia Postprocedure Evaluation (Signed)
Anesthesia Post Note  Patient: Robert [name]  Procedure(s) Performed: ESOPHAGOGASTRODUODENOSCOPY (EGD) WITH PROPOFOL (N/A ) BIOPSY     Patient location during evaluation: PACU Anesthesia Type: MAC Level of consciousness: awake and alert Pain management: pain level controlled Vital Signs Assessment: post-procedure vital signs reviewed and stable Respiratory status: spontaneous breathing, nonlabored ventilation and respiratory function stable Cardiovascular status: stable and blood pressure returned to baseline Anesthetic complications: no    Last Vitals:  Vitals:   05/29/18 0844 05/29/18 0850  BP: (!) 86/54 95/67  Pulse: 79 76  Resp: (!) 8 11  Temp: 37 C   SpO2: 100% 100%    Last Pain:  Vitals:   05/29/18 0850  TempSrc:   PainSc: 0-No pain                 Audry Pili

## 2018-05-29 NOTE — Progress Notes (Signed)
Cancellation Note    05/29/18 0700  OT Visit Information  Last OT Received On 05/29/18  Reason Eval/Treat Not Completed Patient at procedure or test/ unavailable  Maurie Boettcher, OT/L  OT Clinical Specialist [phone]

## 2018-05-29 NOTE — Transfer of Care (Signed)
Immediate Anesthesia Transfer of Care Note  Patient: Robert [name]  Procedure(s) Performed: ESOPHAGOGASTRODUODENOSCOPY (EGD) WITH PROPOFOL (N/A ) BIOPSY  Patient Location: Endoscopy Unit  Anesthesia Type:MAC  Level of Consciousness: awake, alert  and oriented  Airway & Oxygen Therapy: Patient Spontanous Breathing and Patient connected to nasal cannula oxygen  Post-op Assessment: Report given to RN, Post -op Vital signs reviewed and stable and Patient moving all extremities X 4  Post vital signs: Reviewed and stable  Last Vitals:  Vitals Value Taken Time  BP    Temp    Pulse    Resp    SpO2      Last Pain:  Vitals:   05/29/18 0758  TempSrc: Oral  PainSc: 6       Patients Stated Pain Goal: 8 (31/54/00 8676)  Complications: No apparent anesthesia complications

## 2018-05-30 ENCOUNTER — Encounter (HOSPITAL_COMMUNITY): Payer: Self-pay | Admitting: Gastroenterology

## 2018-05-30 DIAGNOSIS — K269 Duodenal ulcer, unspecified as acute or chronic, without hemorrhage or perforation: Secondary | ICD-10-CM

## 2018-05-30 DIAGNOSIS — R1013 Epigastric pain: Secondary | ICD-10-CM

## 2018-05-30 LAB — CBC
HEMATOCRIT: 29.5 % — AB (ref 39.0–52.0)
HEMOGLOBIN: 9.4 g/dL — AB (ref 13.0–17.0)
MCH: 28.7 pg (ref 26.0–34.0)
MCHC: 31.9 g/dL (ref 30.0–36.0)
MCV: 89.9 fL (ref 78.0–100.0)
Platelets: 422 10*3/uL — ABNORMAL HIGH (ref 150–400)
RBC: 3.28 MIL/uL — AB (ref 4.22–5.81)
RDW: 15.9 % — ABNORMAL HIGH (ref 11.5–15.5)
WBC: 6 10*3/uL (ref 4.0–10.5)

## 2018-05-30 LAB — BASIC METABOLIC PANEL
Anion gap: 7 (ref 5–15)
BUN: 15 mg/dL (ref 6–20)
CALCIUM: 8.4 mg/dL — AB (ref 8.9–10.3)
CO2: 24 mmol/L (ref 22–32)
Chloride: 108 mmol/L (ref 98–111)
Creatinine, Ser: 1.05 mg/dL (ref 0.61–1.24)
Glucose, Bld: 119 mg/dL — ABNORMAL HIGH (ref 70–99)
POTASSIUM: 3.8 mmol/L (ref 3.5–5.1)
Sodium: 139 mmol/L (ref 135–145)

## 2018-05-30 LAB — HCV RNA QUANT
HCV QUANT: 2270000 [IU]/mL (ref >50)
HCV Quantitative Log: 6.356 log10 IU/mL (ref >1.70)

## 2018-05-30 MED ORDER — SODIUM CHLORIDE 0.9 % IV BOLUS
500.0000 mL | Freq: Once | INTRAVENOUS | Status: AC
  Administered 2018-05-30: 500 mL via INTRAVENOUS

## 2018-05-30 MED ORDER — LIDOCAINE 5 % EX PTCH
1.0000 | MEDICATED_PATCH | CUTANEOUS | Status: DC
  Administered 2018-05-30: 1 via TRANSDERMAL
  Filled 2018-05-30: qty 1

## 2018-05-30 NOTE — Progress Notes (Addendum)
Daily Rounding Note  05/30/2018, 11:57 AM  LOS: 2 days   SUBJECTIVE:   Chief complaint: Continues to complain vociferously of his spine pain in his neck and lower back.  Abdomen feels fine.  No nausea.      OBJECTIVE:         Vital signs in last 24 hours:    Temp:  [97.4 F (36.3 C)-98.3 F (36.8 C)] 98.3 F (36.8 C) (08/30 0800) Pulse Rate:  [70-75] 74 (08/30 0505) Resp:  [16-18] 18 (08/30 0501) BP: (76-107)/(56-91) 103/91 (08/30 0800) SpO2:  [100 %] 100 % (08/30 0504) Last BM Date: 05/28/18 Filed Weights   05/28/18 1213  Weight: 68 kg   General: Chronically ill looking.  Looks older for age Heart: RRR. Chest: Clear bilaterally but reduced breath sounds. Abdomen: Not tender or distended.  Active bowel sounds. Extremities: No CCE. Neuro/Psych: Alert and oriented not confused.Marland Kitchen  Speech is a little bit slow.  Intake/Output from previous day: 08/29 0701 - 08/30 0700 In: 1036.1 [P.O.:720; I.V.:316.1] Out: 2527 [Urine:2525; Blood:2]  Intake/Output this shift: Total I/O In: 360 [P.O.:360] Out: -   Lab Results: Recent Labs    05/28/18 1155 05/29/18 0443 05/30/18 0246  WBC 6.7 6.7 6.0  HGB 8.7* 9.3* 9.4*  HCT 27.9* 28.3* 29.5*  PLT 487* 418* 422*   BMET Recent Labs    05/28/18 1155 05/29/18 0443 05/30/18 0246  NA 141 138 139  K 4.5 4.3 3.8  CL 108 109 108  CO2 23 23 24   GLUCOSE 89 80 119*  BUN 28* 25* 15  CREATININE 0.99 1.12 1.05  CALCIUM 9.0 8.3* 8.4*   LFT Recent Labs    05/29/18 0443  PROT 5.0*  ALBUMIN 2.6*  AST 29  ALT 22  ALKPHOS 45  BILITOT 1.4*   PT/INR Recent Labs    05/28/18 1609  LABPROT 20.4*  INR 1.76   Hepatitis Panel No results for input(s): HEPBSAG, HCVAB, HEPAIGM, HEPBIGM in the last 72 hours.  Studies/Results: Dg Chest 2 View  Result Date: 05/28/2018 CLINICAL DATA:  Chest pain and syncopal episode EXAM: CHEST - 2 VIEW COMPARISON:  01/07/2017 FINDINGS:  Cardiac shadow is stable. Lungs are mildly hyperinflated bilaterally without focal infiltrate or sizable effusion. No acute bony abnormality is seen. IMPRESSION: Mild COPD without acute abnormality. Electronically Signed   By: Inez Catalina M.D.   On: 05/28/2018 13:19   US Abdomen Limited Ruq  Result Date: 05/28/2018 CLINICAL DATA:  Hepatitis-C.  Question cirrhosis. EXAM: ULTRASOUND ABDOMEN LIMITED RIGHT UPPER QUADRANT COMPARISON:  04/18/2012 CT FINDINGS: Gallbladder: No gallstones or wall thickening visualized. No sonographic Murphy sign noted by sonographer. Common bile duct: Diameter: Normal caliber, 4 mm Liver: No focal lesion identified. Within normal limits in parenchymal echogenicity. Portal vein is patent on color Doppler imaging with normal direction of blood flow towards the liver. IMPRESSION: Unremarkable right upper quadrant ultrasound. Electronically Signed   By: Rolm Baptise M.D.   On: 05/28/2018 21:39    ASSESMENT:   *     Anemia, FOBT positive, epigastric pain in patient taking excessive amounts of Naprosyn. Hgb slightly improved 05/29/2018 EGD revealed small hiatal hernia, gastritis.  Solitary, nonbleeding duodenal ulcer without bleeding stigmata.  Duodenitis with erythema.  Biopsies obtained of the gastritis and duodenitis.  Pathology pending.  No evidence for portal hypertensive gastropathy, gastric or esophageal varices  *   Cirrhosis of the liver on CT in 2016.  Untreated hep C.  *  Repair of hip fracture 03/2018.   PLAN   *   Protonix or other PPI twice daily for 2 months.    *    At some point in the future he should undergo routine screening colonoscopy. He has undergone colonoscopy in 2010, for family history of colon polyps.  There was a poor prep and it was recommended repeating in 1 year which has not happened.  *    Hep C virus quantitative studies is processing.   At some point in the future, if his life stabilizes, he ought to be referred to infectious disease  for treatment of his HCV  *    From GI standpoint he is okay to discharge.  There is a follow-up arranged for 25 September with Dr. Bryan Lemma.   Robert [name]  05/30/2018, 11:57 AM Phone [phone]   Attending physician's note   I have taken an interval history, reviewed the chart and examined the patient. I agree with the Advanced Practitioner's note, impression, and recommendations as outlined. I have arranged for follow-up with me in the GI Clinic. Will sign off at this time, but please do not hesitate to contact the on-call GI with additional questions or concerns.    [address] DO, FACG [phone] office

## 2018-05-30 NOTE — Progress Notes (Signed)
RN paged provider on call to make aware of low BP of 76/56 manually and 89/69 by dinamap, awaiting response from MD. P.J. Linus Mako, RN

## 2018-05-30 NOTE — Progress Notes (Signed)
Patient has a bed at [facility] when medically stable.  Percell Locus [place] LCSW [phone]

## 2018-05-30 NOTE — NC FL2 (Signed)
[place] LEVEL OF CARE SCREENING TOOL     IDENTIFICATION  Patient Name: Robert [name] Birthdate: 1961/05/04 Sex: male Admission Date (Current Location): 05/28/2018  [facility] and [place] Number:  [employer] and Address:  [facility] [place]. [facility] [place] [address], [place], [place] [address]      Provider Number: [id number]  Attending Physician Name and Address:  Alveda Reasons, MD  Relative Name and Phone Number:       Current Level of Care: Hospital Recommended Level of Care: [place] Prior Approval Number:    Date Approved/Denied:   PASRR Number: [id number]   Pt name in system is Robert [name]  Discharge Plan: SNF    Current Diagnoses: Patient Active Problem List   Diagnosis Date Noted  . Chronic pain syndrome   . Acute GI bleeding 05/28/2018  . Arterial hypotension   . Symptomatic anemia   . Liver cirrhosis ([place])   . Disturbance of skin sensation 05/29/2014  . Cellulitis 05/27/2014  . Cellulitis of left hand 05/27/2014  . Chronically on opiate therapy 05/27/2014  . Chronic anticoagulation 05/27/2014  . COPD (chronic obstructive pulmonary disease) ([place]) 04/17/2012  . Leg pain, right 04/17/2012  . Low back pain 04/17/2012  . Hepatitis C 04/17/2012  . Adynamic ileus ([place]) 04/17/2012  . Liver cancer ([place])   . Lung cancer ([employer])   . Pulmonary emboli ([facility])     Orientation RESPIRATION BLADDER Height & Weight     Self, Time, Situation, Place  O2(Nasal cannula 3L) Continent Weight: 150 lb (68 kg) Height:  5\' 7"  (170.2 cm)  BEHAVIORAL SYMPTOMS/MOOD NEUROLOGICAL BOWEL NUTRITION STATUS  (NA)   Continent Diet(Please see DC summary)  AMBULATORY STATUS COMMUNICATION OF NEEDS Skin   Extensive Assist Verbally Surgical wounds(Right hip incision)                       Personal Care Assistance Level of Assistance  Bathing, Feeding, Dressing Bathing Assistance: Maximum assistance Feeding assistance:  Independent Dressing Assistance: Limited assistance     Functional Limitations Info  Sight, Hearing, Speech Sight Info: Adequate Hearing Info: Adequate Speech Info: Adequate    SPECIAL CARE FACTORS FREQUENCY  PT (By licensed PT), OT (By licensed OT)     PT Frequency: 3x per week OT Frequency: 3x per week            Contractures Contractures Info: Not present    Additional Factors Info  Allergies, Code Status, Isolation Precautions, Psychotropic Code Status Info: Full Code Allergies Info: Allergies:  Ciprofloxacin, Penicillins, Adhesive Tape, Morphine And Related Psychotropic Info: Cymbalta   Isolation Precautions Info: MRSA Positive by PCR     Current Medications (05/30/2018):  This is the current hospital active medication list Current Facility-Administered Medications  Medication Dose Route Frequency Provider Last Rate Last Dose  . acetaminophen (TYLENOL) tablet 650 mg  650 mg Oral Q6H PRN Cirigliano, Vito V, DO   650 mg at 05/28/18 2128   Or  . acetaminophen (TYLENOL) suppository 650 mg  650 mg Rectal Q6H PRN Cirigliano, Vito V, DO      . albuterol (PROVENTIL) (2.5 MG/3ML) 0.083% nebulizer solution 3 mL  3 mL Inhalation Q6H PRN Cirigliano, Vito V, DO      . cyclobenzaprine (FLEXERIL) tablet 10 mg  10 mg Oral TID PRN Cirigliano, Vito V, DO   10 mg at 05/30/18 0831  . diphenhydrAMINE (BENADRYL) injection 12.5 mg  12.5 mg Intravenous Q6H  PRN Cirigliano, Vito V, DO   12.5 mg at 05/29/18 0510  . DULoxetine (CYMBALTA) DR capsule 60 mg  60 mg Oral BID Cirigliano, Vito V, DO   60 mg at 05/30/18 0831  . gabapentin (NEURONTIN) capsule 400 mg  400 mg Oral TID Cirigliano, Vito V, DO   400 mg at 05/30/18 0831  . levothyroxine (SYNTHROID, LEVOTHROID) tablet 50 mcg  50 mcg Oral QAC breakfast Cirigliano, Vito V, DO   50 mcg at 05/30/18 0831  . nicotine (NICODERM CQ - dosed in mg/24 hr) patch 7 mg  7 mg Transdermal Daily Cirigliano, Vito V, DO   7 mg at 05/30/18 0830  . ondansetron  (ZOFRAN) tablet 4 mg  4 mg Oral Q6H PRN Cirigliano, Vito V, DO       Or  . ondansetron (ZOFRAN) injection 4 mg  4 mg Intravenous Q6H PRN Cirigliano, Vito V, DO      . oxyCODONE-acetaminophen (PERCOCET/ROXICET) 5-325 MG per tablet 1 tablet  1 tablet Oral Q6H PRN Cirigliano, Vito V, DO   1 tablet at 05/30/18 0755  . pantoprazole (PROTONIX) EC tablet 40 mg  40 mg Oral BID Cirigliano, Vito V, DO   40 mg at 05/30/18 0831  . tamsulosin (FLOMAX) capsule 0.4 mg  0.4 mg Oral BID Cirigliano, Vito V, DO   0.4 mg at 05/30/18 9509     Discharge Medications: Please see discharge summary for a list of discharge medications.  Relevant Imaging Results:  Relevant Lab Results:   Additional Information SSN [id number]  [facility], LCSW
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
## 2018-05-30 NOTE — Clinical Social Work Note (Signed)
Clinical Social Work Assessment  Patient Details  Name: Robert [name] MRN: [id number] Date of Birth: 01-Jan-1961  Date of referral:  05/30/18               Reason for consult:  Facility Placement                Permission sought to share information with:  Facility Sport and exercise psychologist, Family Supports Permission granted to share information::  Yes, Verbal Permission Granted  Name::        Agency::  SNFs  Relationship::     Contact Information:     Housing/Transportation Living arrangements for the past 2 months:  [place] of Information:  Patient Patient Interpreter Needed:  None Criminal Activity/Legal Involvement Pertinent to Current Situation/Hospitalization:  No - Comment as needed Significant Relationships:  None Lives with:  Roommate Do you feel safe going back to the place where you live?  No Need for family participation in patient care:  No (Coment)  Care giving concerns:  CSW received consult for possible SNF placement at time of discharge. CSW spoke with patient regarding PT recommendation of SNF placement at time of discharge. Patient reported that he lives in a halfway house and given patient's current physical needs and fall risk he cannot take care of himself. Patient expressed understanding of PT recommendation and is agreeable to SNF placement at time of discharge. CSW to continue to follow and assist with discharge planning needs.   Social Worker assessment / plan:  CSW spoke with patient concerning possibility of rehab at [facility] before returning home.  Employment status:  Disabled (Comment on whether or not currently receiving Disability) Insurance information:  Medicaid In [place] PT Recommendations:  [place] / Referral to community resources:  [place]  Patient/Family's Response to care:  Patient recognizes need for rehab before returning home and is agreeable to a SNF in [place]. CSW  explained limited Medicaid bed offers and will have a liaison come to evaluate patient. He states he receives disability and is willing to sign check over to SNF and stay a minimum of 30 days.   Patient/Family's Understanding of and Emotional Response to Diagnosis, Current Treatment, and Prognosis:  Patient/family is realistic regarding therapy needs and expressed being hopeful for SNF placement. Patient expressed understanding of CSW role and discharge process as well as medical condition. No questions/concerns about plan or treatment.    Emotional Assessment Appearance:  Appears stated age Attitude/Demeanor/Rapport:  Complaining Affect (typically observed):  Accepting, Appropriate Orientation:  Oriented to Self, Oriented to Place, Oriented to  Time, Oriented to Situation Alcohol / Substance use:  Not Applicable Psych involvement (Current and /or in the community):  No (Comment)  Discharge Needs  Concerns to be addressed:  Care Coordination Readmission within the last 30 days:  No Current discharge risk:  Dependent with Mobility Barriers to Discharge:  Continued Medical Work up   [employer], [place] 05/30/2018, 10:57 AM

## 2018-05-30 NOTE — Progress Notes (Signed)
SATURATION QUALIFICATIONS: (This note is used to comply with regulatory documentation for home oxygen)  Patient Saturations on Room Air at Rest = 96%  Patient Saturations on Room Air while Ambulating = 89%  Patient Saturations on 2 Liters of oxygen while Ambulating = 98%  Please briefly explain why patient needs home oxygen: Pt states he wears O2 @ 2L at home

## 2018-05-31 ENCOUNTER — Emergency Department (HOSPITAL_COMMUNITY): Payer: Medicaid Other

## 2018-05-31 ENCOUNTER — Inpatient Hospital Stay (HOSPITAL_COMMUNITY)
Admission: EM | Admit: 2018-05-31 | Discharge: 2018-06-07 | Disposition: A | Discharge status: Skilled Nursing Facility | Payer: Medicaid Other | Source: Home / Self Care | Attending: Internal Medicine | Admitting: Internal Medicine

## 2018-05-31 ENCOUNTER — Encounter (HOSPITAL_COMMUNITY): Payer: Self-pay | Admitting: Emergency Medicine

## 2018-05-31 DIAGNOSIS — K269 Duodenal ulcer, unspecified as acute or chronic, without hemorrhage or perforation: Secondary | ICD-10-CM

## 2018-05-31 DIAGNOSIS — I951 Orthostatic hypotension: Secondary | ICD-10-CM | POA: Diagnosis present

## 2018-05-31 DIAGNOSIS — J189 Pneumonia, unspecified organism: Secondary | ICD-10-CM | POA: Diagnosis present

## 2018-05-31 DIAGNOSIS — J9621 Acute and chronic respiratory failure with hypoxia: Secondary | ICD-10-CM

## 2018-05-31 DIAGNOSIS — I959 Hypotension, unspecified: Secondary | ICD-10-CM | POA: Diagnosis present

## 2018-05-31 DIAGNOSIS — R338 Other retention of urine: Secondary | ICD-10-CM

## 2018-05-31 DIAGNOSIS — R509 Fever, unspecified: Secondary | ICD-10-CM

## 2018-05-31 DIAGNOSIS — A419 Sepsis, unspecified organism: Secondary | ICD-10-CM | POA: Diagnosis present

## 2018-05-31 DIAGNOSIS — D649 Anemia, unspecified: Secondary | ICD-10-CM

## 2018-05-31 DIAGNOSIS — R000 Tachycardia, unspecified: Secondary | ICD-10-CM | POA: Diagnosis present

## 2018-05-31 LAB — CBC WITH DIFFERENTIAL/PLATELET
BASOS ABS: 0 10*3/uL (ref 0.0–0.1)
Basophils Relative: 0 %
EOS PCT: 1 %
Eosinophils Absolute: 0.2 10*3/uL (ref 0.0–0.7)
HEMATOCRIT: 30.2 % — AB (ref 39.0–52.0)
Hemoglobin: 10.2 g/dL — ABNORMAL LOW (ref 13.0–17.0)
LYMPHS ABS: 1 10*3/uL (ref 0.7–4.0)
LYMPHS PCT: 7 %
MCH: 29.8 pg (ref 26.0–34.0)
MCHC: 33.8 g/dL (ref 30.0–36.0)
MCV: 88.3 fL (ref 78.0–100.0)
Monocytes Absolute: 1.9 10*3/uL — ABNORMAL HIGH (ref 0.1–1.0)
Monocytes Relative: 12 %
NEUTROS ABS: 12.4 10*3/uL — AB (ref 1.7–7.7)
Neutrophils Relative %: 80 %
PLATELETS: 490 10*3/uL — AB (ref 150–400)
RBC: 3.42 MIL/uL — ABNORMAL LOW (ref 4.22–5.81)
RDW: 15.7 % — AB (ref 11.5–15.5)
WBC: 15.6 10*3/uL — ABNORMAL HIGH (ref 4.0–10.5)

## 2018-05-31 LAB — BASIC METABOLIC PANEL
ANION GAP: 9 (ref 5–15)
BUN: 13 mg/dL (ref 6–20)
CALCIUM: 8.4 mg/dL — AB (ref 8.9–10.3)
CO2: 24 mmol/L (ref 22–32)
Chloride: 106 mmol/L (ref 98–111)
Creatinine, Ser: 1.08 mg/dL (ref 0.61–1.24)
Glucose, Bld: 102 mg/dL — ABNORMAL HIGH (ref 70–99)
Potassium: 4.2 mmol/L (ref 3.5–5.1)
SODIUM: 139 mmol/L (ref 135–145)

## 2018-05-31 LAB — URINALYSIS, ROUTINE W REFLEX MICROSCOPIC
Bilirubin Urine: NEGATIVE
GLUCOSE, UA: NEGATIVE mg/dL
Hgb urine dipstick: NEGATIVE
Ketones, ur: NEGATIVE mg/dL
Leukocytes, UA: NEGATIVE
NITRITE: NEGATIVE
PH: 7 (ref 5.0–8.0)
Protein, ur: NEGATIVE mg/dL
SPECIFIC GRAVITY, URINE: 1.011 (ref 1.005–1.030)

## 2018-05-31 LAB — COMPREHENSIVE METABOLIC PANEL
ALBUMIN: 3.2 g/dL — AB (ref 3.5–5.0)
ALT: 26 U/L (ref 0–44)
ANION GAP: 11 (ref 5–15)
AST: 39 U/L (ref 15–41)
Alkaline Phosphatase: 53 U/L (ref 38–126)
BILIRUBIN TOTAL: 1 mg/dL (ref 0.3–1.2)
BUN: 16 mg/dL (ref 6–20)
CHLORIDE: 100 mmol/L (ref 98–111)
CO2: 25 mmol/L (ref 22–32)
Calcium: 8.5 mg/dL — ABNORMAL LOW (ref 8.9–10.3)
Creatinine, Ser: 1.08 mg/dL (ref 0.61–1.24)
GFR calc Af Amer: >60 mL/min (ref >60)
GFR calc non Af Amer: >60 mL/min (ref >60)
GLUCOSE: 94 mg/dL (ref 70–99)
POTASSIUM: 4.5 mmol/L (ref 3.5–5.1)
SODIUM: 136 mmol/L (ref 135–145)
TOTAL PROTEIN: 6 g/dL — AB (ref 6.5–8.1)

## 2018-05-31 LAB — CBC
HEMATOCRIT: 31 % — AB (ref 39.0–52.0)
Hemoglobin: 9.7 g/dL — ABNORMAL LOW (ref 13.0–17.0)
MCH: 29 pg (ref 26.0–34.0)
MCHC: 31.3 g/dL (ref 30.0–36.0)
MCV: 92.5 fL (ref 78.0–100.0)
PLATELETS: 383 10*3/uL (ref 150–400)
RBC: 3.35 MIL/uL — ABNORMAL LOW (ref 4.22–5.81)
RDW: 15.6 % — AB (ref 11.5–15.5)
WBC: 5.4 10*3/uL (ref 4.0–10.5)

## 2018-05-31 LAB — I-STAT CG4 LACTIC ACID, ED: Lactic Acid, Venous: 1.67 mmol/L (ref 0.5–1.9)

## 2018-05-31 MED ORDER — LEVOTHYROXINE SODIUM 50 MCG PO TABS
50.0000 ug | ORAL_TABLET | Freq: Every day | ORAL | Status: DC
  Administered 2018-06-01 – 2018-06-07 (×7): 50 ug via ORAL
  Filled 2018-05-31 (×7): qty 1

## 2018-05-31 MED ORDER — METHOCARBAMOL 500 MG PO TABS
750.0000 mg | ORAL_TABLET | Freq: Three times a day (TID) | ORAL | Status: DC | PRN
  Administered 2018-06-01 – 2018-06-04 (×6): 750 mg via ORAL
  Filled 2018-05-31 (×6): qty 2

## 2018-05-31 MED ORDER — PANTOPRAZOLE SODIUM 40 MG PO TBEC: 40.0000 mg | DELAYED_RELEASE_TABLET | Freq: Two times a day (BID) | ORAL | 0 refills | Status: DC

## 2018-05-31 MED ORDER — DULOXETINE HCL 30 MG PO CPEP
60.0000 mg | ORAL_CAPSULE | Freq: Two times a day (BID) | ORAL | Status: DC
  Administered 2018-05-31 – 2018-06-07 (×14): 60 mg via ORAL
  Filled 2018-05-31 (×14): qty 2

## 2018-05-31 MED ORDER — TAMSULOSIN HCL 0.4 MG PO CAPS
0.4000 mg | ORAL_CAPSULE | Freq: Two times a day (BID) | ORAL | Status: DC
  Administered 2018-05-31 – 2018-06-03 (×7): 0.4 mg via ORAL
  Filled 2018-05-31 (×7): qty 1

## 2018-05-31 MED ORDER — ALBUTEROL SULFATE (2.5 MG/3ML) 0.083% IN NEBU
2.5000 mg | INHALATION_SOLUTION | Freq: Four times a day (QID) | RESPIRATORY_TRACT | Status: DC
  Administered 2018-06-01: 2.5 mg via RESPIRATORY_TRACT
  Filled 2018-05-31: qty 3

## 2018-05-31 MED ORDER — VANCOMYCIN HCL IN DEXTROSE 1-5 GM/200ML-% IV SOLN
1000.0000 mg | Freq: Once | INTRAVENOUS | Status: AC
  Administered 2018-05-31: 1000 mg via INTRAVENOUS
  Filled 2018-05-31: qty 200

## 2018-05-31 MED ORDER — SODIUM CHLORIDE 0.9 % IV SOLN
1.0000 g | Freq: Once | INTRAVENOUS | Status: AC
  Administered 2018-05-31: 1 g via INTRAVENOUS
  Filled 2018-05-31: qty 1

## 2018-05-31 MED ORDER — SODIUM CHLORIDE 0.9 % IV BOLUS
1000.0000 mL | Freq: Once | INTRAVENOUS | Status: AC
  Administered 2018-05-31: 1000 mL via INTRAVENOUS

## 2018-05-31 MED ORDER — OXYCODONE-ACETAMINOPHEN 5-325 MG PO TABS
2.0000 | ORAL_TABLET | Freq: Once | ORAL | Status: AC
  Administered 2018-05-31: 2 via ORAL
  Filled 2018-05-31: qty 2

## 2018-05-31 MED ORDER — FERROUS SULFATE 325 (65 FE) MG PO TABS
325.0000 mg | ORAL_TABLET | Freq: Every day | ORAL | Status: DC
  Administered 2018-05-31 – 2018-06-06 (×7): 325 mg via ORAL
  Filled 2018-05-31 (×7): qty 1

## 2018-05-31 MED ORDER — SODIUM CHLORIDE 0.9 % IV SOLN
INTRAVENOUS | Status: AC
  Administered 2018-05-31 – 2018-06-01 (×2): via INTRAVENOUS

## 2018-05-31 MED ORDER — GABAPENTIN 300 MG PO CAPS
600.0000 mg | ORAL_CAPSULE | Freq: Three times a day (TID) | ORAL | Status: DC
  Administered 2018-05-31 – 2018-06-02 (×5): 600 mg via ORAL
  Filled 2018-05-31 (×5): qty 2

## 2018-05-31 MED ORDER — ALBUTEROL SULFATE (2.5 MG/3ML) 0.083% IN NEBU
2.5000 mg | INHALATION_SOLUTION | Freq: Four times a day (QID) | RESPIRATORY_TRACT | Status: DC | PRN
  Administered 2018-06-02: 2.5 mg via RESPIRATORY_TRACT
  Filled 2018-05-31: qty 3

## 2018-05-31 MED ORDER — PANTOPRAZOLE SODIUM 40 MG PO TBEC
40.0000 mg | DELAYED_RELEASE_TABLET | Freq: Two times a day (BID) | ORAL | Status: DC
  Administered 2018-05-31 – 2018-06-07 (×14): 40 mg via ORAL
  Filled 2018-05-31 (×14): qty 1

## 2018-05-31 MED ORDER — TIOTROPIUM BROMIDE MONOHYDRATE 18 MCG IN CAPS
18.0000 ug | ORAL_CAPSULE | Freq: Every day | RESPIRATORY_TRACT | Status: DC
  Administered 2018-06-02 – 2018-06-07 (×5): 18 ug via RESPIRATORY_TRACT
  Filled 2018-05-31 (×2): qty 5

## 2018-05-31 MED ORDER — LACTATED RINGERS IV BOLUS
1000.0000 mL | Freq: Once | INTRAVENOUS | Status: AC
  Administered 2018-05-31: 1000 mL via INTRAVENOUS

## 2018-05-31 MED ORDER — ALBUTEROL SULFATE HFA 108 (90 BASE) MCG/ACT IN AERS: 2.0000 | INHALATION_SPRAY | Freq: Four times a day (QID) | RESPIRATORY_TRACT | Status: DC | PRN

## 2018-05-31 MED ORDER — ACETAMINOPHEN 325 MG PO TABS: 650.0000 mg | ORAL_TABLET | Freq: Once | ORAL | Status: DC

## 2018-05-31 MED ORDER — SODIUM CHLORIDE 0.9 % IV SOLN
1.0000 g | Freq: Three times a day (TID) | INTRAVENOUS | Status: DC
  Administered 2018-06-01 – 2018-06-02 (×4): 1 g via INTRAVENOUS
  Filled 2018-05-31 (×5): qty 1

## 2018-05-31 MED ORDER — VITAMIN B-12 100 MCG PO TABS
200.0000 ug | ORAL_TABLET | Freq: Every day | ORAL | Status: DC
  Administered 2018-06-01: 200 ug via ORAL
  Filled 2018-05-31 (×2): qty 2

## 2018-05-31 NOTE — H&P (Signed)
TRH H&P   Patient Demographics:    Robert [name], is a 57 y.o. male  MRN: [id number]   DOB - 08/12/1961  Admit Date - 05/31/2018  Outpatient Primary MD for the patient is Patient, No Pcp Per  Referring MD/NP/PA:  Dr. Rex Kras  Outpatient Specialists:      Patient coming from: SNF  Chief Complaint  Patient presents with  . Cough  . Fever      HPI:    Robert [name]  is a 57 y.o. male, w Hypothyroidism, Hep C cirrhosis Hypertension, CVA, h/o PE/ DVT (recurrent), Asthma/ Copd on home o2, just discharged from [facility] for symptomatic anemia from acute GI bleed due to NSAID use causing PUD (duodenal).  Pt apparently was discharged on home o2 due to desaturation with ambulation today.  Pt represents this evening with fever from SNF as well as cough.   In ED,  T 102 P 104, Bp 85/53  Pox 97%  On o2  Wbc 15.6, hgb 10.2, Plt 490,  Na 136, K 4.5, Bun 16, Creatinine 1.08 Alb 3.2 Ast 39, Alt 26, Alk phos 53, T. Bili 1.0  Lactic acid 1.67 Urinalysis negative  Blood culture x2 pending  CXR  IMPRESSION: Interval development of patchy consolidative opacities within the right mid and lower lung and left lung base concerning for pneumonia in the appropriate clinical setting. Followup PA and lateral chest  X-ray is recommended in 3-4 weeks following trial of antibiotic therapy to ensure resolution and exclude underlying malignancy.  Pt will be admitted for sepsis (fever, tachycardia, leukocytosis and hypotension), secondary to Hcap, and anemia (stable)       Review of systems:    In addition to the HPI above, No Fever-chills, No Headache, No changes with Vision or hearing, No problems swallowing food or Liquids, No Chest pain, No Abdominal pain, No Nausea or Vommitting, Bowel movements are regular, No Blood in stool or Urine, No dysuria, No new skin rashes or bruises, No new joints  pains-aches,  No new weakness, tingling, numbness in any extremity, No recent weight gain or loss, No polyuria, polydypsia or polyphagia, No significant Mental Stressors.  A full 10 point Review of Systems was done, except as stated above, all other Review of Systems were negative.   With Past History of the following :    Past Medical History:  Diagnosis Date  . Anxiety   . Arthritis    "I'm eat up w/it"  . Asthma   . B12 deficiency    "give myself shots"  . C. difficile colitis 2011  . Chronic liver failure ([place])   . Chronic lower back pain   . COPD (chronic obstructive pulmonary disease) ([place])   . Deep vein thrombosis (HCC)    "several"  . Depression   . GERD (gastroesophageal reflux disease)   . Hepatitis C 2011   genotype 1a.  never treated.    Marland Kitchen History of blood transfusion 2013   "related to kidneys shutting down"  . Hypertension   . Hypothyroidism   . Intermittent self-catheterization of bladder   . Liver cancer ([place])   . Metastatic lung cancer    "left"  . Migraines    "2-3/day" (05/27/2014)  . Myocardial infarction ([place]) 2007; ~ 2011  . Oxygen dependent    3L; 24/7" (05/27/2014)  . Partial small bowel obstruction ([place])   . Pneumonia    "several times"  . Pulmonary emboli (HCC)    "several"  . Shortness of breath    "all the time"  . Stroke [facility]) ~ 03/2012   "couldn't use my left hand for ~ 6 months" (05/27/2014)  . Stroke [facility]) 05/25/2014   "not able to use my left hand again" (05/27/2014)      Past Surgical History:  Procedure Laterality Date  . BIOPSY  05/29/2018   Procedure: BIOPSY;  Surgeon: Lavena Bullion, DO;  Location: [facility];  Service: Gastroenterology;;  . ESOPHAGOGASTRODUODENOSCOPY (EGD) WITH PROPOFOL N/A 05/29/2018   Procedure: ESOPHAGOGASTRODUODENOSCOPY (EGD) WITH PROPOFOL;  Surgeon: Lavena Bullion, DO;  Location: [facility];  Service: Gastroenterology;  Laterality: N/A;  . INCISION AND DRAINAGE ABSCESS Left 05/28/2014    Procedure: INCISION AND DRAINAGE ABSCESS LEFT WRIST;  Surgeon: Leanora Cover, MD;  Location: [facility];  Service: Orthopedics;  Laterality: Left;  . KNEE SURGERY Left ~ 1972   "cut top of my kneecap off"  . MULTIPLE TOOTH EXTRACTIONS  11/2010   "26"  . TONSILLECTOMY AND ADENOIDECTOMY  1974  . TRANSURETHRAL RESECTION OF PROSTATE  2012; 2014      Social History:     Social History   Tobacco Use  . Smoking status: Current Every Day Smoker    Packs/day: 0.50    Years: 41.00    Pack years: 20.50    Types: Cigarettes  . Smokeless tobacco: Former Network engineer Use Topics  . Alcohol use: Yes    Comment: 8 drinks a week     Lives - at SNF  Mobility - walks by self   Family History :     Family History  Problem Relation Age of Onset  . Cancer Mother   . Cancer Paternal Aunt   . Cancer Maternal Aunt       Home Medications:   Prior to Admission medications   Medication Sig Start Date End Date Taking? Authorizing Provider  albuterol (PROVENTIL HFA;VENTOLIN HFA) 108 (90 Base) MCG/ACT inhaler Inhale 2 puffs into the lungs every 6 (six) hours as needed for wheezing or shortness of breath.   Yes [provider]  diclofenac sodium (VOLTAREN) 1 % GEL Apply 2 g topically 4 (four) times daily.   Yes [provider]  DULoxetine (CYMBALTA) 60 MG capsule Take 1 capsule (60 mg total) by mouth 2 (two) times daily. 06/07/14  Yes Delo, Nathaneil Canary, MD  FERROUS SULFATE IRON 200 (65 Fe) MG TABS Take 1 tablet by mouth at bedtime. 04/09/18  Yes [provider]  gabapentin (NEURONTIN) 300 MG capsule Take 600 mg by mouth 3 (three) times daily.   Yes [provider]  Ipratropium-Albuterol (COMBIVENT RESPIMAT) 20-100 MCG/ACT AERS respimat Inhale 1 puff into the lungs every 6 (six) hours.   Yes [provider]  levothyroxine (SYNTHROID, LEVOTHROID) 50 MCG tablet Take 1 tablet (50 mcg total) by mouth daily before breakfast. 06/07/14  Yes Delo, Nathaneil Canary, MD  methocarbamol  (ROBAXIN) 750 MG  tablet Take 750 mg by mouth 3 (three) times daily as needed for pain. 04/04/18  Yes [provider]  pantoprazole (PROTONIX) 40 MG tablet Take 1 tablet (40 mg total) by mouth 2 (two) times daily. 05/31/18  Yes Rory Percy, DO  tamsulosin (FLOMAX) 0.4 MG CAPS capsule Take 1 capsule (0.4 mg total) by mouth at bedtime. Patient taking differently: Take 0.4 mg by mouth 2 (two) times daily.  06/07/14  Yes Delo, Nathaneil Canary, MD  tiotropium (SPIRIVA) 18 MCG inhalation capsule Place 18 mcg into inhaler and inhale daily.   Yes [provider]  vitamin B-12 (CYANOCOBALAMIN) 50 MCG tablet Take 200 mcg by mouth daily.   Yes [provider]  albuterol (PROVENTIL HFA;VENTOLIN HFA) 108 (90 BASE) MCG/ACT inhaler Inhale 1-2 puffs into the lungs every 6 (six) hours as needed for wheezing or shortness of breath. Patient not taking: Reported on 05/28/2018 09/20/14   Quintella Reichert, MD  cyclobenzaprine (FLEXERIL) 10 MG tablet Take 1 tablet (10 mg total) by mouth 3 (three) times daily as needed for muscle spasms. Patient not taking: Reported on 05/28/2018 01/08/17   [name], MD  gabapentin (NEURONTIN) 400 MG capsule Take 1 capsule (400 mg total) by mouth 3 (three) times daily. Patient not taking: Reported on 05/28/2018 06/07/14   Veryl Speak, MD  lidocaine (LIDODERM) 5 % Place 1 patch onto the skin daily. Remove & Discard patch within 12 hours or as directed by MD Patient not taking: Reported on 01/08/2017 10/13/14   Orpah Greek, MD  oxyCODONE (ROXICODONE) 5 MG immediate release tablet Take 1 tablet (5 mg total) by mouth every 6 (six) hours as needed for severe pain. Patient not taking: Reported on 01/08/2017 10/02/16   Lorayne Bender, PA-C  oxyCODONE-acetaminophen (PERCOCET/ROXICET) 5-325 MG per tablet Take 1 tablet by mouth every 6 (six) hours as needed for moderate pain or severe pain. Patient not taking: Reported on 01/08/2017 09/20/14   Quintella Reichert, MD      Allergies:     Allergies  Allergen Reactions  . Ciprofloxacin Hives  . Penicillins Itching    Has patient had a PCN reaction causing immediate rash, facial/tongue/throat swelling, SOB or lightheadedness with hypotension: YES Has patient had a PCN reaction causing severe rash involving mucus membranes or skin necrosis: NO Has patient had a PCN reaction that required hospitalization NO Has patient had a PCN reaction occurring within the last 10 years: NO If all of the above answers are "NO", then may proceed with Cephalosporin use.  . Adhesive  [Tape] Rash  . Morphine And Related Itching and Rash     Physical Exam:   Vitals  Blood pressure (!) 85/53, pulse (!) 104, temperature (!) 102 F (38.9 C), temperature source Rectal, resp. rate (!) 33, height 5' 7" (1.702 m), weight 68 kg, SpO2 97 %.   1. General  lying in bed in NAD,    2. Normal affect and insight, Not Suicidal or Homicidal, Awake Alert, Oriented X 3.  3. No F.N deficits, ALL C.Nerves Intact, Strength 5/5 all 4 extremities, Sensation intact all 4 extremities, Plantars down going.  4. Ears and Eyes appear Normal, Conjunctivae clear, PERRLA. Moist Oral Mucosa.  5. Supple Neck, No JVD, No cervical lymphadenopathy appriciated, No Carotid Bruits.  6. Symmetrical Chest wall movement, + bibasilar crackles, no wheezing  7. RRR, No Gallops, Rubs or Murmurs, No Parasternal Heave.  8. Positive Bowel Sounds, Abdomen Soft, No tenderness, No organomegaly appriciated,No rebound -guarding or rigidity.  9.  No Cyanosis, Normal Skin Turgor, No Skin Rash or Bruise.  10. Good muscle tone,  joints appear normal , no effusions, Normal ROM.  11. No Palpable Lymph Nodes in Neck or Axillae     Data Review:    CBC Recent Labs  Lab 05/28/18 1155 05/29/18 0443 05/30/18 0246 05/31/18 0504 05/31/18 1834  WBC 6.7 6.7 6.0 5.4 15.6*  HGB 8.7* 9.3* 9.4* 9.7* 10.2*  HCT 27.9* 28.3* 29.5* 31.0* 30.2*  PLT 487* 418* 422* 383 490*  MCV  91.5 87.6 89.9 92.5 88.3  MCH 28.5 28.8 28.7 29.0 29.8  MCHC 31.2 32.9 31.9 31.3 33.8  RDW 15.3 15.8* 15.9* 15.6* 15.7*  LYMPHSABS  --   --   --   --  1.0  MONOABS  --   --   --   --  1.9*  EOSABS  --   --   --   --  0.2  BASOSABS  --   --   --   --  0.0   ------------------------------------------------------------------------------------------------------------------  Chemistries  Recent Labs  Lab 05/28/18 1155 05/29/18 0443 05/30/18 0246 05/31/18 0504 05/31/18 1834  NA 141 138 139 139 136  K 4.5 4.3 3.8 4.2 4.5  CL 108 109 108 106 100  CO2 _0 GLUCOSE 89 80 119* 102* 94  BUN 28* 25* _1 CREATININE 0.99 1.12 1.05 1.08 1.08  CALCIUM 9.0 8.3* 8.4* 8.4* 8.5*  AST  --  29  --   --  39  ALT  --  22  --   --  26  ALKPHOS  --  45  --   --  53  BILITOT  --  1.4*  --   --  1.0   ------------------------------------------------------------------------------------------------------------------ estimated creatinine clearance is 70.6 mL/min (by C-G formula based on SCr of 1.08 mg/dL). ------------------------------------------------------------------------------------------------------------------ No results for input(s): TSH, T4TOTAL, T3FREE, THYROIDAB in the last 72 hours.  Invalid input(s): FREET3  Coagulation profile Recent Labs  Lab 05/28/18 1609  INR 1.76   ------------------------------------------------------------------------------------------------------------------- No results for input(s): DDIMER in the last 72 hours. -------------------------------------------------------------------------------------------------------------------  Cardiac Enzymes No results for input(s): CKMB, TROPONINI, MYOGLOBIN in the last 168 hours.  Invalid input(s): CK ------------------------------------------------------------------------------------------------------------------ No results found for:  BNP   ---------------------------------------------------------------------------------------------------------------  Urinalysis    Component Value Date/Time   COLORURINE YELLOW 05/31/2018 1951   APPEARANCEUR CLEAR 05/31/2018 1951   LABSPEC 1.011 05/31/2018 1951   PHURINE 7.0 05/31/2018 1951   GLUCOSEU NEGATIVE 05/31/2018 1951   HGBUR NEGATIVE 05/31/2018 1951   BILIRUBINUR NEGATIVE 05/31/2018 [place] NEGATIVE 05/31/2018 1951   PROTEINUR NEGATIVE 05/31/2018 1951   UROBILINOGEN 0.2 05/27/2014 1652   NITRITE NEGATIVE 05/31/2018 1951   LEUKOCYTESUR NEGATIVE 05/31/2018 1951    ----------------------------------------------------------------------------------------------------------------   Imaging Results:    Dg Chest 2 View  Result Date: 05/31/2018 CLINICAL DATA:  Cough.  Low-grade fever. EXAM: CHEST - 2 VIEW COMPARISON:  Chest radiograph 05/28/2018 FINDINGS: Monitoring leads overlie the patient. Stable cardiac and mediastinal contours. Interval development of patchy consolidative opacities within the right mid and lower lung and left lung base. No pleural effusion or pneumothorax. Thoracic spine degenerative changes. IMPRESSION: Interval development of patchy consolidative opacities within the right mid and lower lung and left lung base concerning for pneumonia in the appropriate clinical setting. Followup PA and lateral chest X-ray is recommended in 3-4 weeks following trial of antibiotic therapy to ensure resolution and exclude underlying malignancy. Electronically Signed  By: Lovey Newcomer M.D.   On: 05/31/2018 19:51       Assessment & Plan:    Principal Problem:   HCAP (healthcare-associated pneumonia) Active Problems:   Hypotension   Anemia   Sepsis ([place])   Tachycardia   Fever    Sepsis (fever, tachycardia, leukocytosis) Blood culture x2 Sputum gram stain culture vanco iv, pharmacy to dose cefepime iv pharmacy to dose (due to pcn allergy)  Hcap Blood  culture x2 Sputum gram stain culture Urine strep antigen Urine legionella antigen abx as above  Tachycardia Tele Trop I q6h x3 Check TSH Will not repeat cardiac echo unless trop + since just had 05/29/2018 with EF=60-65%  Hypotension Ns iv at 100 mL per hour  PUD (duodenal) Cont PPI  Anemia Check cbc in am  Copd / Asthma Cont spiriva 1puff qday DC combivent since use of this with spiriva will actually reduce lung function.  Cont albuterol  Anxiety Cont Cymbalta 16m po qday  Hypothyroidism Cont Levothyroxine  Bph Cont flomax 0.465mpo qhs, may need to hold if hypotension continues  Chronic pain Cont Gabapentin HOLD oxycodone due to hypotension     DVT Prophylaxis  - SCDs  AM Labs Ordered, also please review Full Orders  Family Communication: Admission, patients condition and plan of care including tests being ordered have been discussed with the patient  who indicate understanding and agree with the plan and Code Status.  Code Status  FULL CODE  Likely DC to   home  Condition Critical  Consults called:   none  Admission status: inpatient,  Pt will require hospitalization for sepsis secondary to Hcap and hypotension.  Pt will require iv abx, and thus will require 2 nites or more of stay and inpatient status  Time spent in minutes : [id number] JaJani [email or web address] on 05/31/2018 at 9:35 PM  Between 7am to 7pm - Pager - [phone] After 7pm go to [email or web address] - password [email or web address] - Office  [phone]

## 2018-05-31 NOTE — ED Notes (Signed)
Bed: XY58 Expected date:  Expected time:  Means of arrival:  Comments: Hold for Resc A

## 2018-05-31 NOTE — Discharge Summary (Signed)
[facility] Discharge Summary  Patient name: Robert [name]  Medical record number: [id number] Date of birth: 04/26/1961          Age: 57 y.o.    Gender: male Date of Admission: 05/28/2018                      Date of Discharge:05/31/18 Admitting Physician: Alveda Reasons, MD  Primary Care Provider: Patient, No Pcp Per Consultants: GI   Indication for Hospitalization: symptomatic anemia with acute GI bleed   Discharge Diagnoses/Problem List:  Acute GI Bleed with syncopal episode  Chronic Anemia Acute Urinary retention  Emphysema/COPD  Chronic Neck & Back Pain  Hypothyroidism  Tobacco Use  History of Polysubstance Abuse  History of PE on Xarelto   Disposition: to SNF  Discharge Condition: stable, improved   Physical Exam  Constitutional: He is oriented to person, place, and time.  HENT:  Mouth/Throat: No oropharyngeal exudate.  Eyes: Pupils are equal, round, and reactive to light. EOM are normal. Right eye exhibits no discharge. Left eye exhibits no discharge. No scleral icterus.  Neck: No thyromegaly present.  Chronic neck pain with decreased range of motion 2/2 to pain   Cardiovascular: Normal rate, regular rhythm, normal heart sounds and intact distal pulses. Exam reveals no gallop and no friction rub.  No murmur heard. Pulmonary/Chest: No stridor.  Abdominal: Soft. Bowel sounds are normal. He exhibits no distension and no mass. There is tenderness. There is no rebound.  Musculoskeletal: He exhibits tenderness.  Lymphadenopathy:    He has no cervical adenopathy.  Neurological: He is oriented to person, place, and time.  Skin: Skin is warm and dry. No rash noted. No erythema.  Vitals reviewed.  Brief Hospital Course:  Mr. Burkitt was admitted on 05/27/18 after presenting with symptomatic anemia and syncopal episode that was likely 2/2 to acute GI bleed in the setting of NSAID use. Patient reported taking up to 6 NSAID capsules  per day which likely contributed to his bleeding episode.  Upon presenting to the ED, he was found to have a Hbg of 8.7 from previous 11.8. In the ED, vitals were notable for tachycardia to 119 and hypotension as low as 81/56 for which he was administered 2 liters of normal saline followed by 2 units of pRBCs. His post transfusion Hgb was 9.3. The patient was also started on IV Ciprofloxacin for prophylaxis in a patient with acute GI bleed with potential cirrhosis given his history of HepC. He was unable to tolerate this antibiotic and it was subsequently discontinued. He was also started on Octreotide IV and Protonix 80mg  gtt for protection in case of esophageal varices given his history of alcohol abuse which was later converted to IV and PO respectively. EGD revealed a non-bleeding duodenal ulcer and no evidence of esophageal bleeding.  Cardiac echo results showed LV EF: 60% - 65%.  Pathology report from the EGD biopsy pending.   Mr. Winker reported not urinating for 2 days on the date of admission so he underwent a bladder scan and was found to have 279mL of urine in his bladder but was able to urinate soon after with a PVR of 53mL. He continued his home dose of Flomax 0.4mg  daily and had good urine output throughout rest of admission, foley was not needed.  Patient stable for discharge from GI standpoint with follow up appointment scheduled.  He had an ambulation trial on RA with desaturation, so will be discharged  on 2 L of supplemental oxygen.  Patient was medically stable at time of discharge to SNF.    Issues for Follow Up:  1. Please obtain CBC to check Hgb  2. UOP, ensure patient is not retaining urine (has history of this)  3. Xarelto was discontinued at discharge. PE was several years ago, thought to be provoked. Risks of bleeding with duodenal ulcer were thought to outweigh benefits of continued anticoagulation at this time.  Significant Procedures:   -EGD 8/29   Significant Labs and  Imaging:  Last Labs        Recent Labs  Lab 05/28/18 1155 05/29/18 0443 05/30/18 0246  WBC 6.7 6.7 6.0  HGB 8.7* 9.3* 9.4*  HCT 27.9* 28.3* 29.5*  PLT 487* 418* 422*     Last Labs        Recent Labs  Lab 05/28/18 1155 05/29/18 0443 05/30/18 0246  NA 141 138 139  K 4.5 4.3 3.8  CL 108 109 108  CO2 23 23 24   GLUCOSE 89 80 119*  BUN 28* 25* 15  CREATININE 0.99 1.12 1.05  CALCIUM 9.0 8.3* 8.4*  ALKPHOS  --  45  --   AST  --  29  --   ALT  --  22  --   ALBUMIN  --  2.6*  --      Results/Tests Pending at Time of Discharge:  EGD 05/29/18 pathology from biopsy pending   Discharge Medications:       Allergies as of 05/30/2018      Reactions   Ciprofloxacin Hives   Penicillins Itching   Has patient had a PCN reaction causing immediate rash, facial/tongue/throat swelling, SOB or lightheadedness with hypotension: YES Has patient had a PCN reaction causing severe rash involving mucus membranes or skin necrosis: NO Has patient had a PCN reaction that required hospitalization NO Has patient had a PCN reaction occurring within the last 10 years: NO If all of the above answers are "NO", then may proceed with Cephalosporin use.   Adhesive  [tape] Rash   Morphine And Related Itching, Rash          Medication List        STOP taking these medications       ibuprofen 400 MG tablet Commonly known as:  ADVIL,MOTRIN   naproxen 500 MG tablet Commonly known as:  NAPROSYN             TAKE these medications       albuterol 108 (90 Base) MCG/ACT inhaler Commonly known as:  PROVENTIL HFA;VENTOLIN HFA Inhale 1-2 puffs into the lungs every 6 (six) hours as needed for wheezing or shortness of breath.   COMBIVENT RESPIMAT 20-100 MCG/ACT Aers respimat Generic drug:  Ipratropium-Albuterol Inhale 1 puff into the lungs every 6 (six) hours.   cyclobenzaprine 10 MG tablet Commonly known as:  FLEXERIL Take 1 tablet (10 mg total) by mouth 3 (three) times daily as  needed for muscle spasms.   diclofenac sodium 1 % Gel Commonly known as:  VOLTAREN Apply 2 g topically 4 (four) times daily.   DULoxetine 60 MG capsule Commonly known as:  CYMBALTA Take 1 capsule (60 mg total) by mouth 2 (two) times daily.   FERROUS SULFATE IRON 200 (65 Fe) MG Tabs Generic drug:  Ferrous Sulfate Dried Take 1 tablet by mouth at bedtime.   gabapentin 300 MG capsule Commonly known as:  NEURONTIN Take 600 mg by mouth 3 (three) times daily.  gabapentin 400 MG capsule Commonly known as:  NEURONTIN Take 1 capsule (400 mg total) by mouth 3 (three) times daily.   levothyroxine 50 MCG tablet Commonly known as:  SYNTHROID, LEVOTHROID Take 1 tablet (50 mcg total) by mouth daily before breakfast.   lidocaine 5 % Commonly known as:  LIDODERM Place 1 patch onto the skin daily. Remove & Discard patch within 12 hours or as directed by MD   methocarbamol 750 MG tablet Commonly known as:  ROBAXIN Take 750 mg by mouth 3 (three) times daily as needed for pain.   omeprazole 20 MG capsule Commonly known as:  PRILOSEC Take 1 capsule (20 mg total) by mouth 2 (two) times daily. What changed:  when to take this   oxyCODONE 5 MG immediate release tablet Commonly known as:  Oxy IR/ROXICODONE Take 1 tablet (5 mg total) by mouth every 6 (six) hours as needed for severe pain.   oxyCODONE-acetaminophen 5-325 MG tablet Commonly known as:  PERCOCET/ROXICET Take 1 tablet by mouth every 6 (six) hours as needed for moderate pain or severe pain.   Rivaroxaban 15 MG Tabs tablet Commonly known as:  XARELTO Take 15 mg by mouth 2 (two) times daily with a meal. Patient states he is taking the 15mg  once a day verified with the patient   tamsulosin 0.4 MG Caps capsule Commonly known as:  FLOMAX Take 1 capsule (0.4 mg total) by mouth at bedtime. What changed:  when to take this   tiotropium 18 MCG inhalation capsule Commonly known as:  SPIRIVA Place 18 mcg into inhaler and  inhale daily.   vitamin B-12 50 MCG tablet Commonly known as:  CYANOCOBALAMIN Take 200 mcg by mouth daily.                        Durable Medical Equipment  (From admission, onward)                        Start     Ordered    05/30/18 1547  For home use only DME oxygen  Once    Question Answer Comment  Mode or (Route) Nasal cannula   Liters per Minute 2   Frequency Continuous (stationary and portable oxygen unit needed)   Oxygen delivery system Gas      05/30/18 1547            Discharge Instructions: Please refer to Patient Instructions section of EMR for full details.  Patient was counseled important signs and symptoms that should prompt return to medical care, changes in medications, dietary instructions, activity restrictions, and follow up appointments.   You were admitted to the hospital for bleeding in your gastrointestinal system that is likely due to taking too many NSAIDs for pain management. Please follow up for an additional endoscopy and colonoscopy in 8 weeks to check for healing of the ulcer found during your endoscopy on 05/29/18. Avoid taking NSAIDS and try to use Tylenol for pain management instead.   Follow-Up Appointments: Follow-up Information    Gerrit Heck V, DO Follow up on 06/25/2018.   Specialty:  Gastroenterology Why:  Appt at 1:30 PM. Please arrive 15 minutes early for appointment.  Contact information: [address]        Garwin Brothers, MD Follow up on 06/11/2018.   Specialty:  Internal Medicine Why:  Follow up with PCP as previously scheduled. Contact information: [address] [place] [address] [phone]           Gastroenterology: Your appointment is scheduled for June 25, 2018 at 1:30 pm. The office is located at [address] [place], [place], [address].  Lovenia Kim, MD

## 2018-05-31 NOTE — ED Notes (Signed)
Bed: RESA Expected date: 05/31/18 Expected time: 5:35 PM Means of arrival: Ambulance Comments: Fever

## 2018-05-31 NOTE — Progress Notes (Addendum)
Patient will Discharge To: [facility] Anticipated DC Date:05/31/18 Family Notified: Ralene Ok, sister [phone] Transport By: Corey Harold   Per MD patient ready for DC to [facility] . RN, patient, patient's family, and facility notified of DC. Assessment, Fl2/Pasrr, and Discharge Summary sent to facility. RN given number for report [phone], Room # 227 B). DC packet on chart. Ambulance transport requested for patient.   CSW signing off.  Reed Breech LCSWA [phone]

## 2018-05-31 NOTE — Progress Notes (Signed)
ED TO INPATIENT HANDOFF REPORT  Name/Age/Gender Robert [name] 57 y.o. male  Code Status    Code Status Orders  (From admission, onward)         Start     Ordered   05/31/18 2155  Full code  Continuous     05/31/18 2155        Code Status History    Date Active Date Inactive Code Status Order ID Comments User Context   05/28/2018 1710 05/31/2018 1742 Full Code [id number]  Lovenia Kim, MD Inpatient   05/27/2014 2153 05/31/2014 1644 Full Code [id number]  Berle Mull, MD Inpatient      Home/SNF/Other Home  Chief Complaint Cough;Fever  Level of Care/Admitting Diagnosis ED Disposition    ED Disposition Condition Comment   Admit  Hospital Area: [facility] [[id number]]  Level of Care: Stepdown [14]  Admit to SDU based on following criteria: Hemodynamic compromise or significant risk of instability:  Patient requiring short term acute titration and management of vasoactive drips, and invasive monitoring (i.e., CVP and Arterial line).  Diagnosis: HCAP (healthcare-associated pneumonia) [662947]  Admitting Physician: Jani Gravel [[id number]]  Attending Physician: Jani Gravel [phone]  Estimated length of stay: past midnight tomorrow  Certification:: I certify this patient will need inpatient services for at least 2 midnights  PT Class (Do Not Modify): Inpatient [101]  PT Acc Code (Do Not Modify): Private [1]       Medical History Past Medical History:  Diagnosis Date  . Anxiety   . Arthritis    "I'm eat up w/it"  . Asthma   . B12 deficiency    "give myself shots"  . C. difficile colitis 2011  . Chronic liver failure ([place])   . Chronic lower back pain   . COPD (chronic obstructive pulmonary disease) ([place])   . Deep vein thrombosis ([facility])    "several"  . Depression   . GERD (gastroesophageal reflux disease)   . Hepatitis C 2011   genotype 1a.  never treated.    Marland Kitchen History of blood transfusion 2013   "related to kidneys shutting down"  . Hypertension    . Hypothyroidism   . Intermittent self-catheterization of bladder   . Liver cancer ([place])   . Metastatic lung cancer    "left"  . Migraines    "2-3/day" (05/27/2014)  . Myocardial infarction ([place]) 2007; ~ 2011  . Oxygen dependent    3L; 24/7" (05/27/2014)  . Partial small bowel obstruction ([place])   . Pneumonia    "several times"  . Pulmonary emboli ([facility])    "several"  . Shortness of breath    "all the time"  . Stroke [facility]) ~ 03/2012   "couldn't use my left hand for ~ 6 months" (05/27/2014)  . Stroke [facility]) 05/25/2014   "not able to use my left hand again" (05/27/2014)    Allergies Allergies  Allergen Reactions  . Ciprofloxacin Hives  . Penicillins Itching    Has patient had a PCN reaction causing immediate rash, facial/tongue/throat swelling, SOB or lightheadedness with hypotension: YES Has patient had a PCN reaction causing severe rash involving mucus membranes or skin necrosis: NO Has patient had a PCN reaction that required hospitalization NO Has patient had a PCN reaction occurring within the last 10 years: NO If all of the above answers are "NO", then may proceed with Cephalosporin use.  . Adhesive  [Tape] Rash  . Morphine And Related Itching and Rash    IV Location/Drains/Wounds Patient Lines/Drains/Airways Status  Active Line/Drains/Airways    Name:   Placement date:   Placement time:   Site:   Days:   Peripheral IV 05/31/18 Left Forearm   05/31/18    2108    Forearm   less than 1   Incision (Closed) 05/28/18 Hip Right   05/28/18    1909     3          Labs/Imaging Results for orders placed or performed during the hospital encounter of 05/31/18 (from the past 48 hour(s))  Comprehensive metabolic panel     Status: Abnormal   Collection Time: 05/31/18  6:34 PM  Result Value Ref Range   Sodium 136 135 - 145 mmol/L   Potassium 4.5 3.5 - 5.1 mmol/L    Comment: MODERATE HEMOLYSIS   Chloride 100 98 - 111 mmol/L   CO2 25 22 - 32 mmol/L   Glucose, Bld 94 70 - 99  mg/dL   BUN 16 6 - 20 mg/dL   Creatinine, Ser 1.08 0.61 - 1.24 mg/dL   Calcium 8.5 (L) 8.9 - 10.3 mg/dL   Total Protein 6.0 (L) 6.5 - 8.1 g/dL   Albumin 3.2 (L) 3.5 - 5.0 g/dL   AST 39 15 - 41 U/L   ALT 26 0 - 44 U/L   Alkaline Phosphatase 53 38 - 126 U/L   Total Bilirubin 1.0 0.3 - 1.2 mg/dL   GFR calc non Af Amer >60 >60 mL/min   GFR calc Af Amer >60 >60 mL/min    Comment: (NOTE) The eGFR has been calculated using the CKD EPI equation. This calculation has not been validated in all clinical situations. eGFR's persistently <60 mL/min signify possible Chronic Kidney Disease.    Anion gap 11 5 - 15    Comment: Performed at [facility], [place] [address], [place], [place] [address]  CBC with Differential     Status: Abnormal   Collection Time: 05/31/18  6:34 PM  Result Value Ref Range   WBC 15.6 (H) 4.0 - 10.5 K/uL   RBC 3.42 (L) 4.22 - 5.81 MIL/uL   Hemoglobin 10.2 (L) 13.0 - 17.0 g/dL   HCT 30.2 (L) 39.0 - 52.0 %   MCV 88.3 78.0 - 100.0 fL   MCH 29.8 26.0 - 34.0 pg   MCHC 33.8 30.0 - 36.0 g/dL   RDW 15.7 (H) 11.5 - 15.5 %   Platelets 490 (H) 150 - 400 K/uL   Neutrophils Relative % 80 %   Neutro Abs 12.4 (H) 1.7 - 7.7 K/uL   Lymphocytes Relative 7 %   Lymphs Abs 1.0 0.7 - 4.0 K/uL   Monocytes Relative 12 %   Monocytes Absolute 1.9 (H) 0.1 - 1.0 K/uL   Eosinophils Relative 1 %   Eosinophils Absolute 0.2 0.0 - 0.7 K/uL   Basophils Relative 0 %   Basophils Absolute 0.0 0.0 - 0.1 K/uL    Comment: Performed at [facility], [place] [address] [place], [place] [address]  I-Stat CG4 Lactic Acid, ED     Status: None   Collection Time: 05/31/18  7:00 PM  Result Value Ref Range   Lactic Acid, Venous 1.67 0.5 - 1.9 mmol/L  Urinalysis, Routine w reflex microscopic     Status: None   Collection Time: 05/31/18  7:51 PM  Result Value Ref Range   Color, Urine YELLOW YELLOW   APPearance CLEAR CLEAR   Specific Gravity, Urine 1.011 1.005 - 1.030   pH  7.0 5.0 -  8.0   Glucose, UA NEGATIVE NEGATIVE mg/dL   Hgb urine dipstick NEGATIVE NEGATIVE   Bilirubin Urine NEGATIVE NEGATIVE   Ketones, ur NEGATIVE NEGATIVE mg/dL   Protein, ur NEGATIVE NEGATIVE mg/dL   Nitrite NEGATIVE NEGATIVE   Leukocytes, UA NEGATIVE NEGATIVE    Comment: Performed at [facility] [address], [place], [place] [address]   Dg Chest 2 View  Result Date: 05/31/2018 CLINICAL DATA:  Cough.  Low-grade fever. EXAM: CHEST - 2 VIEW COMPARISON:  Chest radiograph 05/28/2018 FINDINGS: Monitoring leads overlie the patient. Stable cardiac and mediastinal contours. Interval development of patchy consolidative opacities within the right mid and lower lung and left lung base. No pleural effusion or pneumothorax. Thoracic spine degenerative changes. IMPRESSION: Interval development of patchy consolidative opacities within the right mid and lower lung and left lung base concerning for pneumonia in the appropriate clinical setting. Followup PA and lateral chest X-ray is recommended in 3-4 weeks following trial of antibiotic therapy to ensure resolution and exclude underlying malignancy. Electronically Signed   By: Lovey Newcomer M.D.   On: 05/31/2018 19:51    Pending Labs Unresulted Labs (From admission, onward)    Start     Ordered   06/01/18 0500  CBC  Tomorrow morning,   R     05/31/18 2155   06/01/18 0500  Comprehensive metabolic panel  Tomorrow morning,   R     05/31/18 2155   05/31/18 2155  Legionella Pneumophila Serogp 1 Ur Ag  Once,   R     05/31/18 2155   05/31/18 2154  Culture, blood (routine x 2) Call MD if unable to obtain prior to antibiotics being given  BLOOD CULTURE X 2,   R    Comments:  If blood cultures drawn in Emergency Department - Do not draw and cancel order   Question:  Patient immune status  Answer:  Normal   05/31/18 2155   05/31/18 2154  Culture, sputum-assessment  Once,   R    Question:  Patient immune status  Answer:  Normal    05/31/18 2155   05/31/18 2154  Gram stain  Once,   R    Question:  Patient immune status  Answer:  Normal   05/31/18 2155   05/31/18 2154  HIV antibody (Routine Screening)  Once,   R     05/31/18 2155   05/31/18 2154  Strep pneumoniae urinary antigen  Once,   R     05/31/18 2155   05/31/18 1834  Culture, blood (routine x 2)  BLOOD CULTURE X 2,   STAT     05/31/18 1834   05/31/18 1834  Urine culture  STAT,   STAT     05/31/18 1834          Vitals/Pain Today's Vitals   05/31/18 2047 05/31/18 2100 05/31/18 2130 05/31/18 2143  BP: (!) 88/47     Pulse:   (!) 106   Resp: (!) 23 19 (!) 29   Temp:      TempSrc:      SpO2:   100%   Weight:      Height:      PainSc:    9     Isolation Precautions No active isolations  Medications Medications  vancomycin (VANCOCIN) IVPB 1000 mg/200 mL premix (1,000 mg Intravenous New Bag/Given 05/31/18 2139)  0.9 %  sodium chloride infusion (has no administration in time range)  ceFEPIme (MAXIPIME) 1 g in sodium chloride 0.9 %  100 mL IVPB (has no administration in time range)  oxyCODONE-acetaminophen (PERCOCET/ROXICET) 5-325 MG per tablet 2 tablet (2 tablets Oral Given 05/31/18 2042)  ceFEPIme (MAXIPIME) 1 g in sodium chloride 0.9 % 100 mL IVPB (0 g Intravenous Stopped 05/31/18 2117)  lactated ringers bolus 1,000 mL (1,000 mLs Intravenous New Bag/Given 05/31/18 2040)    Mobility walks with device

## 2018-05-31 NOTE — Progress Notes (Signed)
Nsg Discharge Note  Admit Date:  05/28/2018 Discharge date: 05/31/2018   Robert [name] to be D/C'd Skilled nursing facility per MD order.  AVS completed.  Copy for chart, and copy for patient signed, and dated. Patient/caregiver able to verbalize understanding.  Discharge Medication: Allergies as of 05/31/2018      Reactions   Ciprofloxacin Hives   Penicillins Itching   Has patient had a PCN reaction causing immediate rash, facial/tongue/throat swelling, SOB or lightheadedness with hypotension: YES Has patient had a PCN reaction causing severe rash involving mucus membranes or skin necrosis: NO Has patient had a PCN reaction that required hospitalization NO Has patient had a PCN reaction occurring within the last 10 years: NO If all of the above answers are "NO", then may proceed with Cephalosporin use.   Adhesive  [tape] Rash   Morphine And Related Itching, Rash      Medication List    STOP taking these medications   ibuprofen 400 MG tablet Commonly known as:  ADVIL,MOTRIN   naproxen 500 MG tablet Commonly known as:  NAPROSYN   omeprazole 20 MG capsule Commonly known as:  PRILOSEC   Rivaroxaban 15 MG Tabs tablet Commonly known as:  XARELTO     TAKE these medications   albuterol 108 (90 Base) MCG/ACT inhaler Commonly known as:  PROVENTIL HFA;VENTOLIN HFA Inhale 1-2 puffs into the lungs every 6 (six) hours as needed for wheezing or shortness of breath.   COMBIVENT RESPIMAT 20-100 MCG/ACT Aers respimat Generic drug:  Ipratropium-Albuterol Inhale 1 puff into the lungs every 6 (six) hours.   cyclobenzaprine 10 MG tablet Commonly known as:  FLEXERIL Take 1 tablet (10 mg total) by mouth 3 (three) times daily as needed for muscle spasms.   diclofenac sodium 1 % Gel Commonly known as:  VOLTAREN Apply 2 g topically 4 (four) times daily.   DULoxetine 60 MG capsule Commonly known as:  CYMBALTA Take 1 capsule (60 mg total) by mouth 2 (two) times daily.   FERROUS  SULFATE IRON 200 (65 Fe) MG Tabs Generic drug:  Ferrous Sulfate Dried Take 1 tablet by mouth at bedtime.   gabapentin 300 MG capsule Commonly known as:  NEURONTIN Take 600 mg by mouth 3 (three) times daily.   gabapentin 400 MG capsule Commonly known as:  NEURONTIN Take 1 capsule (400 mg total) by mouth 3 (three) times daily.   levothyroxine 50 MCG tablet Commonly known as:  SYNTHROID, LEVOTHROID Take 1 tablet (50 mcg total) by mouth daily before breakfast.   lidocaine 5 % Commonly known as:  LIDODERM Place 1 patch onto the skin daily. Remove & Discard patch within 12 hours or as directed by MD Notes to patient:  Wear patch for 12 hours on and then remove for 12 hours.    methocarbamol 750 MG tablet Commonly known as:  ROBAXIN Take 750 mg by mouth 3 (three) times daily as needed for pain.   oxyCODONE 5 MG immediate release tablet Commonly known as:  Oxy IR/ROXICODONE Take 1 tablet (5 mg total) by mouth every 6 (six) hours as needed for severe pain.   oxyCODONE-acetaminophen 5-325 MG tablet Commonly known as:  PERCOCET/ROXICET Take 1 tablet by mouth every 6 (six) hours as needed for moderate pain or severe pain.   pantoprazole 40 MG tablet Commonly known as:  PROTONIX Take 1 tablet (40 mg total) by mouth 2 (two) times daily.   tamsulosin 0.4 MG Caps capsule Commonly known as:  FLOMAX Take 1 capsule (0.4  mg total) by mouth at bedtime. What changed:  when to take this   tiotropium 18 MCG inhalation capsule Commonly known as:  SPIRIVA Place 18 mcg into inhaler and inhale daily.   vitamin B-12 50 MCG tablet Commonly known as:  CYANOCOBALAMIN Take 200 mcg by mouth daily.            Durable Medical Equipment  (From admission, onward)         Start     Ordered   05/30/18 1547  For home use only DME oxygen  Once    Question Answer Comment  Mode or (Route) Nasal cannula   Liters per Minute 2   Frequency Continuous (stationary and portable oxygen unit needed)    Oxygen delivery system Gas      05/30/18 1547          Discharge Assessment: Vitals:   05/31/18 0544 05/31/18 1313  BP: 98/66 102/61  Pulse: 79 (!) 105  Resp: 18 16  Temp: 98 F (36.7 C) 97.6 F (36.4 C)  SpO2: 98% 99%   Skin clean, dry and intact without evidence of skin break down, no evidence of skin tears noted. IV catheter discontinued intact. Site without signs and symptoms of complications - no redness or edema noted at insertion site, patient denies c/o pain - only slight tenderness at site.  Dressing with slight pressure applied.  D/c Instructions-Education: Discharge instructions given to patient/family with verbalized understanding. D/c education completed with patient/family including follow up instructions, medication list, d/c activities limitations if indicated, with other d/c instructions as indicated by MD - patient able to verbalize understanding, all questions fully answered. Patient instructed to return to ED, call 911, or call MD for any changes in condition.   Upon EMS arrival, patient verbalized feeling dizzy and SOB. Vital signs obtained and showed patient had temp of 100.3. Dr. Ouida Sills of service spoke with and stated that patient is ok to be discharged. Patient escorted via stretcher and two EMS.   Hiram Comber, RN 05/31/2018 4:37 PM

## 2018-05-31 NOTE — Progress Notes (Signed)
Family Medicine Teaching Service Daily Progress Note Intern Pager: [phone]  Patient name: Huxton Glaus Medical record number: [id number] Date of birth: 1961-04-22 Age: 57 y.o. Gender: male  Primary Care Provider: Patient, No Pcp Per Consultants: GI Code Status: FULL   Pt Overview and Major Events to Date:  Symptomatic anemia, syncopal episdoe 2/2 to acute GI bleed   Assessment and Plan: Jaecion Dempster a 57 y.o.malepresenting with symptomatic anemia in the setting of acute GI bleed. PMH is significant for history of PE (on Xarelto at home), h/o lung, liver andprostate cancer,Hep C, COPD, hypothyroidism, andchronic neck and back pain2/2 DDD.  Symptomatic anemia with syncope (resolved), likely 2/2 acute GI bleed  Patient admitted with syncopal episode, however symptoms resolved now after blood transfusion this admission. Hgb 9.4 yesterday, 9.7 today. Anemia is likely secondary to duodenal ulcer noted on EGD.  No symptoms anemia symptoms this AM other than general weakness with ambulation. - monitor Hgb daily while inpatient, would recheck at follow up after DC   -holding home xarelto, DC at discharge as his PE was ~7 years ago -vitals per unit routine - patient stable for DC to SNF when bed is available  Duodenal Ulcer/Duodenitis GI following. EGD shows small hiatal hernia, gastritis.  Solitary, nonbleeding duodenal ulcer without bleeding stigmata.  Duodenitis with erythema.  Biopsies obtained of the gastritis and duodenitis.  Pathology pending.  No evidence for portal hypertensive gastropathy, gastric or esophageal varices.   - follow up path report - appreciate GI recs -zofran prn  -GI recs: advance diet as tolerated, protonix BID x2 months,  repeat upper endoscopy in 8 weeks with colonoscopy.  - DC xarelto at discharge -avoid NSAIDs   AcuteUrinary Retention, Resolved Does not seem to remain an active issue at this time.  Spontaneous urine output has  been adequate during this admission. 800 ml output yesterday. -monitor urine output -strict I/Os -continue Flomax 0.4mg    Emphysema/COPD, stable  Patient has history ofmetastatic lung cancer.Smoking history of 3PPD for 45 years and currently smoking one packevery three days. Requiring 2L by O2 which will be continued at discharge to SNF. WAT at SNF. - Wean O2 as tolerated after discharge to SNF -DuoNebs PRN  -Albuterol PRN q 6 hours  Chronic neck and back pain, Stable   Chronic. Taking 400mg  NeurontinTIDfor nerve painand flexeril 10 mg. -Percocet 5 q 6 hrs PRN  -continueNeurontin400mg  TID  -Flexeril 10mg  TID PRN  -Tylenol 650mg  PRN   Hyperuremia, Resolved BUN elevated on admission to 28up from 5 in 12/2017,likely due to acute bleed.  BUN gradually improving. Pending for AM of 8/31. -monitor on daily BMP  Hypothyroidism, Stable  -continue home Synthroid 14mcg QD  Tobacco use Smoking history of 3PPD for 45 years and currently smoking onepackevery three days. Nicotine patch offered and pt agreeable.  -7mg  transdermal nicotine patch  History of Polysubstance Abuse  Patient reports prior use of heroin and drinking 1/5 of a pint daily. Patient reports stopping usage 8 months ago after attending rehabilitation.  -Negative UDS on admission.   FEN/GI:regular diet   Prophylaxis:Home xarelto held;SCDs in setting of GI bleed  Disposition:d/c to SNF per OT recommendations pending oxygen wean or passed walk test, pending SNF placement   Subjective:  No acute events overnight.   Patient's main complaint this AM is his chronic back pain which he states will not be fixed by PT. Discussed benefit of PT after a hospitalization to help with strength and mobility.  Objective: Temp:  [97.6  F (36.4 C)-98.3 F (36.8 C)] 98 F (36.7 C) (08/31 0544) Pulse Rate:  [70-79] 79 (08/31 0544) Resp:  [18] 18 (08/31 0544) BP: (76-103)/(56-91) 98/66 (08/31 0544) SpO2:   [98 %-100 %] 98 % (08/31 0544) on 3Liters [place]   Physical Exam:  General: NAD, appears older than stated age Cardiovascular: RRR, no m/r/g Respiratory: CTA bil, no W/R/R Abdomen:  Soft with right upper and lower quadrant tenderness, + BS  Extremities: warm and well-perfused  Laboratory: Recent Labs  Lab 05/28/18 1155 05/29/18 0443 05/30/18 0246  WBC 6.7 6.7 6.0  HGB 8.7* 9.3* 9.4*  HCT 27.9* 28.3* 29.5*  PLT 487* 418* 422*   Recent Labs  Lab 05/28/18 1155 05/29/18 0443 05/30/18 0246  NA 141 138 139  K 4.5 4.3 3.8  CL 108 109 108  CO2 23 23 24   BUN 28* 25* 15  CREATININE 0.99 1.12 1.05  CALCIUM 9.0 8.3* 8.4*  PROT  --  5.0*  --   BILITOT  --  1.4*  --   ALKPHOS  --  45  --   ALT  --  22  --   AST  --  29  --   GLUCOSE 89 80 119*   Imaging/Diagnostic Tests:  Echocardiogram 05/29/18 LV EF: 60% -   65%, Moderately dilated right ventricle with normal systoplic   function, normal RVSP.   Everrett Coombe, MD PGY-3 [place] [facility]

## 2018-05-31 NOTE — ED Provider Notes (Signed)
[facility] DEPT Provider Note   CSN: [id number] Arrival date & time: 05/31/18  1742     History   Chief Complaint Chief Complaint  Patient presents with  . Cough  . Fever    HPI Robert [name] is a 57 y.o. male.  57 year old male with extensive past medical history including COPD, hepatitis C, PUD c/b GI bleed, PE/DVT, alcoholic cirrhosis, chronic pain who p/w cough and fever.  The patient was admitted to the hospital on 8/28 for syncopal episode related to GI bleed.  He was discovered to have peptic ulcer, treated and discharged from the hospital today.  He was transported by [facility] back to nursing facility. When he arrived there, they noted his temperature was 100 and he was coughing. They felt that he was unsafe to keep there thus they sent him back to ED. he reports a few day history of cough associated with shortness of breath.  He tells me that he is supposed to be chronically on oxygen but his oxygen tank was taken away from him 2 years ago.  Of note, he was discharged home on 2 L oxygen today.  He complains of low back pain that has been severe and constant this week.  On further questioning, he states that he has had low back pain for 30 years.  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  The history is provided by the patient and the EMS personnel.  Cough   Fever   Associated symptoms include cough.    Past Medical History:  Diagnosis Date  . Anxiety   . Arthritis    "I'm eat up w/it"  . Asthma   . B12 deficiency    "give myself shots"  . C. difficile colitis 2011  . Chronic liver failure ([place])   . Chronic lower back pain   . COPD (chronic obstructive pulmonary disease) ([place])   . Deep vein thrombosis (HCC)    "several"  . Depression   . GERD (gastroesophageal reflux disease)   . Hepatitis C 2011   genotype 1a.  never treated.    Marland Kitchen History of blood transfusion 2013   "related to kidneys  shutting down"  . Hypertension   . Hypothyroidism   . Intermittent self-catheterization of bladder   . Liver cancer ([place])   . Metastatic lung cancer    "left"  . Migraines    "2-3/day" (05/27/2014)  . Myocardial infarction ([place]) 2007; ~ 2011  . Oxygen dependent    3L; 24/7" (05/27/2014)  . Partial small bowel obstruction ([place])   . Pneumonia    "several times"  . Pulmonary emboli (HCC)    "several"  . Shortness of breath    "all the time"  . Stroke [facility]) ~ 03/2012   "couldn't use my left hand for ~ 6 months" (05/27/2014)  . Stroke [facility]) 05/25/2014   "not able to use my left hand again" (05/27/2014)    Patient Active Problem List   Diagnosis Date Noted  . Duodenal ulcer   . Chronic pain syndrome   . Acute GI bleeding 05/28/2018  . Arterial hypotension   . Symptomatic anemia   . Liver cirrhosis ([place])   . Disturbance of skin sensation 05/29/2014  . Cellulitis 05/27/2014  . Cellulitis of left hand 05/27/2014  . Chronically on opiate therapy 05/27/2014  . Chronic anticoagulation 05/27/2014  . COPD (chronic obstructive pulmonary disease) ([place]) 04/17/2012  . Leg pain,  right 04/17/2012  . Low back pain 04/17/2012  . Hepatitis C 04/17/2012  . Adynamic ileus ([place]) 04/17/2012  . Liver cancer ([place])   . Lung cancer ([place])   . Pulmonary emboli [facility])     Past Surgical History:  Procedure Laterality Date  . BIOPSY  05/29/2018   Procedure: BIOPSY;  Surgeon: Lavena Bullion, DO;  Location: [facility];  Service: Gastroenterology;;  . ESOPHAGOGASTRODUODENOSCOPY (EGD) WITH PROPOFOL N/A 05/29/2018   Procedure: ESOPHAGOGASTRODUODENOSCOPY (EGD) WITH PROPOFOL;  Surgeon: Lavena Bullion, DO;  Location: [place];  Service: Gastroenterology;  Laterality: N/A;  . INCISION AND DRAINAGE ABSCESS Left 05/28/2014   Procedure: INCISION AND DRAINAGE ABSCESS LEFT WRIST;  Surgeon: Leanora Cover, MD;  Location: [place];  Service: Orthopedics;  Laterality: Left;  . KNEE SURGERY Left ~ 1972   "cut top  of my kneecap off"  . MULTIPLE TOOTH EXTRACTIONS  11/2010   "26"  . TONSILLECTOMY AND ADENOIDECTOMY  1974  . TRANSURETHRAL RESECTION OF PROSTATE  2012; 2014        Home Medications    Prior to Admission medications   Medication Sig Start Date End Date Taking? Authorizing Provider  albuterol (PROVENTIL HFA;VENTOLIN HFA) 108 (90 BASE) MCG/ACT inhaler Inhale 1-2 puffs into the lungs every 6 (six) hours as needed for wheezing or shortness of breath. Patient not taking: Reported on 05/28/2018 09/20/14   Quintella Reichert, MD  cyclobenzaprine (FLEXERIL) 10 MG tablet Take 1 tablet (10 mg total) by mouth 3 (three) times daily as needed for muscle spasms. Patient not taking: Reported on 05/28/2018 01/08/17   [name], MD  diclofenac sodium (VOLTAREN) 1 % GEL Apply 2 g topically 4 (four) times daily.    [provider]  DULoxetine (CYMBALTA) 60 MG capsule Take 1 capsule (60 mg total) by mouth 2 (two) times daily. 06/07/14   Veryl Speak, MD  FERROUS SULFATE IRON 200 (65 Fe) MG TABS Take 1 tablet by mouth at bedtime. 04/09/18   [provider]  gabapentin (NEURONTIN) 300 MG capsule Take 600 mg by mouth 3 (three) times daily.    [provider]  gabapentin (NEURONTIN) 400 MG capsule Take 1 capsule (400 mg total) by mouth 3 (three) times daily. Patient not taking: Reported on 05/28/2018 06/07/14   Veryl Speak, MD  Ipratropium-Albuterol (COMBIVENT RESPIMAT) 20-100 MCG/ACT AERS respimat Inhale 1 puff into the lungs every 6 (six) hours.    [provider]  levothyroxine (SYNTHROID, LEVOTHROID) 50 MCG tablet Take 1 tablet (50 mcg total) by mouth daily before breakfast. 06/07/14   Veryl Speak, MD  lidocaine (LIDODERM) 5 % Place 1 patch onto the skin daily. Remove & Discard patch within 12 hours or as directed by MD Patient not taking: Reported on 01/08/2017 10/13/14   Orpah Greek, MD  methocarbamol (ROBAXIN) 750 MG tablet Take 750 mg by mouth 3 (three) times daily  as needed for pain. 04/04/18   [provider]  oxyCODONE (ROXICODONE) 5 MG immediate release tablet Take 1 tablet (5 mg total) by mouth every 6 (six) hours as needed for severe pain. Patient not taking: Reported on 01/08/2017 10/02/16   Lorayne Bender, PA-C  oxyCODONE-acetaminophen (PERCOCET/ROXICET) 5-325 MG per tablet Take 1 tablet by mouth every 6 (six) hours as needed for moderate pain or severe pain. Patient not taking: Reported on 01/08/2017 09/20/14   Quintella Reichert, MD  pantoprazole (PROTONIX) 40 MG tablet Take 1 tablet (40 mg total) by mouth 2 (two) times daily. 05/31/18  Rory Percy, DO  tamsulosin (FLOMAX) 0.4 MG CAPS capsule Take 1 capsule (0.4 mg total) by mouth at bedtime. Patient taking differently: Take 0.4 mg by mouth 2 (two) times daily.  06/07/14   Veryl Speak, MD  tiotropium (SPIRIVA) 18 MCG inhalation capsule Place 18 mcg into inhaler and inhale daily.    [provider]  vitamin B-12 (CYANOCOBALAMIN) 50 MCG tablet Take 200 mcg by mouth daily.    [provider]    Family History Family History  Problem Relation Age of Onset  . Cancer Mother   . Cancer Paternal Aunt   . Cancer Maternal Aunt     Social History Social History   Tobacco Use  . Smoking status: Current Every Day Smoker    Packs/day: 0.50    Years: 41.00    Pack years: 20.50    Types: Cigarettes  . Smokeless tobacco: Former Network engineer Use Topics  . Alcohol use: Yes    Comment: 8 drinks a week  . Drug use: No     Allergies   Ciprofloxacin; Penicillins; Adhesive  [tape]; and Morphine and related   Review of Systems Review of Systems  Constitutional: Positive for fever.  Respiratory: Positive for cough.    All other systems reviewed and are negative except that which was mentioned in HPI   Physical Exam Updated Vital Signs BP 125/73 (BP Location: Right Arm)   Pulse (!) 116   Temp 100.1 F (37.8 C) (Oral)   Resp 20   Ht 5\' 7"  (1.702 m)   Wt 68 kg    SpO2 98%   BMI 23.48 kg/m   Physical Exam  Constitutional: He is oriented to person, place, and time. He appears well-developed. No distress.  Disheveled, chronically ill-appearing  HENT:  Head: Normocephalic and atraumatic.  Moist mucous membranes  Eyes: Pupils are equal, round, and reactive to light. Conjunctivae are normal.  Neck: Neck supple.  Cardiovascular: Regular rhythm and normal heart sounds. Tachycardia present.  No murmur heard. Pulmonary/Chest: Effort normal.  Diminished breath sounds bilaterally, no wheezing  Abdominal: Soft. Bowel sounds are normal. He exhibits no distension. There is no tenderness.  Musculoskeletal: He exhibits no edema.  Neurological: He is alert and oriented to person, place, and time.  Fluent speech  Skin: Skin is warm and dry.  Psychiatric: He has a normal mood and affect.  Nursing note and vitals reviewed.    ED Treatments / Results  Labs (all labs ordered are listed, but only abnormal results are displayed) Labs Reviewed  COMPREHENSIVE METABOLIC PANEL - Abnormal; Notable for the following components:      Result Value   Calcium 8.5 (*)    Total Protein 6.0 (*)    Albumin 3.2 (*)    All other components within normal limits  CBC WITH DIFFERENTIAL/PLATELET - Abnormal; Notable for the following components:   WBC 15.6 (*)    RBC 3.42 (*)    Hemoglobin 10.2 (*)    HCT 30.2 (*)    RDW 15.7 (*)    Platelets 490 (*)    Neutro Abs 12.4 (*)    Monocytes Absolute 1.9 (*)    All other components within normal limits  CULTURE, BLOOD (ROUTINE X 2)  CULTURE, BLOOD (ROUTINE X 2)  URINE CULTURE  EXPECTORATED SPUTUM ASSESSMENT W REFEX TO RESP CULTURE  GRAM STAIN  URINALYSIS, ROUTINE W REFLEX MICROSCOPIC  HIV ANTIBODY (ROUTINE TESTING)  STREP PNEUMONIAE URINARY ANTIGEN  LEGIONELLA PNEUMOPHILA SEROGP 1 UR AG  CBC  COMPREHENSIVE METABOLIC PANEL  I-STAT CG4 LACTIC ACID, ED  I-STAT CG4 LACTIC ACID, ED    EKG EKG  Interpretation  Date/Time:  Saturday May 31 2018 19:20:01 EDT Ventricular Rate:  109 PR Interval:    QRS Duration: 90 QT Interval:  336 QTC Calculation: 453 R Axis:   64 Text Interpretation:  Sinus tachycardia Ventricular premature complex Baseline wander in lead(s) V2 V6 similar to previous Confirmed by Theotis Burrow [phone]) on 05/31/2018 7:41:12 PM   Radiology Dg Chest 2 View  Result Date: 05/31/2018 CLINICAL DATA:  Cough.  Low-grade fever. EXAM: CHEST - 2 VIEW COMPARISON:  Chest radiograph 05/28/2018 FINDINGS: Monitoring leads overlie the patient. Stable cardiac and mediastinal contours. Interval development of patchy consolidative opacities within the right mid and lower lung and left lung base. No pleural effusion or pneumothorax. Thoracic spine degenerative changes. IMPRESSION: Interval development of patchy consolidative opacities within the right mid and lower lung and left lung base concerning for pneumonia in the appropriate clinical setting. Followup PA and lateral chest X-ray is recommended in 3-4 weeks following trial of antibiotic therapy to ensure resolution and exclude underlying malignancy. Electronically Signed   By: Lovey Newcomer M.D.   On: 05/31/2018 19:51    Procedures .Critical Care Performed by: Sharlett Iles, MD Authorized by: Sharlett Iles, MD   Critical care provider statement:    Critical care time (minutes):  30   Critical care time was exclusive of:  Separately billable procedures and treating other patients   Critical care was necessary to treat or prevent imminent or life-threatening deterioration of the following conditions:  Sepsis   Critical care was time spent personally by me on the following activities:  Development of treatment plan with patient or surrogate, evaluation of patient's response to treatment, examination of patient, obtaining history from patient or surrogate, ordering and performing treatments and interventions, ordering  and review of laboratory studies, ordering and review of radiographic studies, re-evaluation of patient's condition and review of old charts   (including critical care time)  Medications Ordered in ED Medications - No data to display   Initial Impression / Assessment and Plan / ED Course  I have reviewed the triage vital signs and the nursing notes.  Pertinent labs & imaging results that were available during my care of the patient were reviewed by me and considered in my medical decision making (see chart for details).    Pt was alert on exam, O2 sat 99% on 3L. T 100.1. Mild tachycardia, normal BP. No respiratory distress.  Initial lactate normal.  Patient later developed fever, obtain blood and urine cultures, gave IV fluid bolus and Tylenol.  Chest x-ray shows multifocal pneumonia.  Gave HCAP coverage w/ Vanc and cefepime.  He remains comfortable with stable O2 saturation and no respiratory distress on repeat examination.  Initially discussed with family medicine teaching service, Dr. Marcello Moores, as patient was originally admitted to their service over at [facility].  They deferred to [facility] given that patient is currently located at [facility].  Discussed with hospitalist Dr. Maudie Mercury, appreciate his assistance.  Patient admitted for further care.  Final Clinical Impressions(s) / ED Diagnoses   Final diagnoses:  Multifocal pneumonia    ED Discharge Orders    None       Janson Lamar, Wenda Overland, MD 06/01/18 [phone]

## 2018-05-31 NOTE — Progress Notes (Signed)
A consult was received from an ED physician for vancomycin per pharmacy dosing.  The patient's profile has been reviewed for ht/wt/allergies/indication/available labs.   A one time order has been placed for vancomycin 1g IV.  Further antibiotics/pharmacy consults should be ordered by admitting physician if indicated.                       Thank you, Romeo Rabon, PharmD. Mobile: [phone]. 05/31/2018,8:25 PM.

## 2018-06-01 DIAGNOSIS — J9621 Acute and chronic respiratory failure with hypoxia: Secondary | ICD-10-CM

## 2018-06-01 LAB — BPAM RBC
BLOOD PRODUCT EXPIRATION DATE: 201909052359
Blood Product Expiration Date: 201909052359
Blood Product Expiration Date: 201909232359
ISSUE DATE / TIME: 201908281843
ISSUE DATE / TIME: 201908282211
UNIT TYPE AND RH: 9500
UNIT TYPE AND RH: 9500
Unit Type and Rh: 9500

## 2018-06-01 LAB — CBC
HEMATOCRIT: 27.1 % — AB (ref 39.0–52.0)
HEMOGLOBIN: 8.8 g/dL — AB (ref 13.0–17.0)
MCH: 29 pg (ref 26.0–34.0)
MCHC: 32.5 g/dL (ref 30.0–36.0)
MCV: 89.4 fL (ref 78.0–100.0)
Platelets: 423 10*3/uL — ABNORMAL HIGH (ref 150–400)
RBC: 3.03 MIL/uL — ABNORMAL LOW (ref 4.22–5.81)
RDW: 15.8 % — ABNORMAL HIGH (ref 11.5–15.5)
WBC: 16.1 10*3/uL — ABNORMAL HIGH (ref 4.0–10.5)

## 2018-06-01 LAB — TYPE AND SCREEN
ABO/RH(D): O NEG
Antibody Screen: NEGATIVE
UNIT DIVISION: 0
Unit division: 0
Unit division: 0

## 2018-06-01 LAB — COMPREHENSIVE METABOLIC PANEL
ALT: 20 U/L (ref 0–44)
AST: 24 U/L (ref 15–41)
Albumin: 2.7 g/dL — ABNORMAL LOW (ref 3.5–5.0)
Alkaline Phosphatase: 51 U/L (ref 38–126)
Anion gap: 7 (ref 5–15)
BILIRUBIN TOTAL: 0.4 mg/dL (ref 0.3–1.2)
BUN: 13 mg/dL (ref 6–20)
CO2: 25 mmol/L (ref 22–32)
Calcium: 8.2 mg/dL — ABNORMAL LOW (ref 8.9–10.3)
Chloride: 111 mmol/L (ref 98–111)
Creatinine, Ser: 0.94 mg/dL (ref 0.61–1.24)
GLUCOSE: 114 mg/dL — AB (ref 70–99)
Potassium: 3.6 mmol/L (ref 3.5–5.1)
Sodium: 143 mmol/L (ref 135–145)
Total Protein: 5.1 g/dL — ABNORMAL LOW (ref 6.5–8.1)

## 2018-06-01 LAB — MRSA PCR SCREENING: MRSA by PCR: NEGATIVE

## 2018-06-01 LAB — HEMOGLOBIN AND HEMATOCRIT, BLOOD
HCT: 26.5 % — ABNORMAL LOW (ref 39.0–52.0)
Hemoglobin: 8.7 g/dL — ABNORMAL LOW (ref 13.0–17.0)

## 2018-06-01 LAB — HIV ANTIBODY (ROUTINE TESTING W REFLEX): HIV Screen 4th Generation wRfx: NONREACTIVE

## 2018-06-01 LAB — STREP PNEUMONIAE URINARY ANTIGEN: Strep Pneumo Urinary Antigen: NEGATIVE

## 2018-06-01 MED ORDER — SODIUM CHLORIDE 0.9 % IV BOLUS
1000.0000 mL | Freq: Once | INTRAVENOUS | Status: AC
  Administered 2018-06-01: 1000 mL via INTRAVENOUS

## 2018-06-01 MED ORDER — ALBUTEROL SULFATE (2.5 MG/3ML) 0.083% IN NEBU
2.5000 mg | INHALATION_SOLUTION | Freq: Four times a day (QID) | RESPIRATORY_TRACT | Status: DC
  Administered 2018-06-01 (×3): 2.5 mg via RESPIRATORY_TRACT
  Filled 2018-06-01 (×3): qty 3

## 2018-06-01 MED ORDER — VANCOMYCIN HCL IN DEXTROSE 750-5 MG/150ML-% IV SOLN
750.0000 mg | Freq: Two times a day (BID) | INTRAVENOUS | Status: DC
  Administered 2018-06-01: 750 mg via INTRAVENOUS
  Filled 2018-06-01: qty 150

## 2018-06-01 MED ORDER — ALBUTEROL SULFATE (2.5 MG/3ML) 0.083% IN NEBU
2.5000 mg | INHALATION_SOLUTION | Freq: Three times a day (TID) | RESPIRATORY_TRACT | Status: DC
  Administered 2018-06-02 – 2018-06-07 (×17): 2.5 mg via RESPIRATORY_TRACT
  Filled 2018-06-01 (×17): qty 3

## 2018-06-01 MED ORDER — OXYCODONE HCL 5 MG PO TABS
5.0000 mg | ORAL_TABLET | Freq: Four times a day (QID) | ORAL | Status: DC | PRN
  Administered 2018-06-01 – 2018-06-03 (×8): 5 mg via ORAL
  Filled 2018-06-01 (×9): qty 1

## 2018-06-01 NOTE — Progress Notes (Signed)
Pharmacy Antibiotic Note  Robert [name] is a 57 y.o. male admitted on 05/31/2018 with pneumonia.  Pharmacy has been consulted for Vancomycin dosing.  Plan: Vancomycin 1gm iv x1, then 750mg  iv q12hr  Goal AUC = 400 - 500 for all indications, except meningitis (goal AUC > 500 and Cmin 15-20 mcg/mL)   Height: 5\' 7"  (170.2 cm) Weight: 149 lb 14.6 oz (68 kg) IBW/kg (Calculated) : 66.1  Temp (24hrs), Avg:99.1 F (37.3 C), Min:97.6 F (36.4 C), Max:102 F (38.9 C)  Recent Labs  Lab 05/29/18 0443 05/30/18 0246 05/31/18 0504 05/31/18 1834 05/31/18 1900 06/01/18 0116 06/01/18 0425  WBC 6.7 6.0 5.4 15.6*  --  16.1*  --   CREATININE 1.12 1.05 1.08 1.08  --   --  0.94  LATICACIDVEN  --   --   --   --  1.67  --   --     Estimated Creatinine Clearance: 81.1 mL/min (by C-G formula based on SCr of 0.94 mg/dL).    Allergies  Allergen Reactions  . Ciprofloxacin Hives  . Penicillins Itching    Has patient had a PCN reaction causing immediate rash, facial/tongue/throat swelling, SOB or lightheadedness with hypotension: YES Has patient had a PCN reaction causing severe rash involving mucus membranes or skin necrosis: NO Has patient had a PCN reaction that required hospitalization NO Has patient had a PCN reaction occurring within the last 10 years: NO If all of the above answers are "NO", then may proceed with Cephalosporin use.  . Adhesive  [Tape] Rash  . Morphine And Related Itching and Rash    Antimicrobials this admission: Vancomycin 05/31/2018 >> Cefepime 05/31/2018 >>   Dose adjustments this admission: -  Microbiology results: -  Thank you for allowing pharmacy to be a part of this patient's care.  Nani Skillern Crowford 06/01/2018 5:59 AM

## 2018-06-01 NOTE — Progress Notes (Signed)
[facility] PROGRESS NOTE    Progress Note  Robert [name]  VFI:[id number] DOB: 1960-12-10 DOA: 05/31/2018 PCP: Patient, No Pcp Per     Brief Narrative:   Robert [name] is an 57 y.o. male past medical history of hypothyroidism, hepatitis C with cirrhosis history of PE and DVT  and COPD on home O2 discharged from: Today for symptomatic anemia and upper GI bleed due to NSAIDs causing a duodenal ulcer apparently was discharged home and was desaturating with ambulation came into the ED was found to have a fever hypoxic with a white count of 15, chest x-ray showed patchy consolidation in the right mid and lower lobe  Assessment/Plan:   Acute on chronic respiratory failure with hypoxia due to HCAP (healthcare-associated pneumonia): He was continue on oxygen, was started empirically on IV vancomycin and cefepime. Had a temperature of 102 at 7 PM yesterday we will continue to monitor fever curve. Culture data is pending.  Sepsis Healthcare is associated pneumonia: Started empirically on IV vancomycin and cefepime he was given 2 L bolus of normal saline but his blood pressure continues to be borderline.  He is on no antihypertensive medications at home so this blood pressure measurements we are seeing might be close to his baseline. We will give him an additional bolus of normal saline urine antigens are pending. Tachycardia is improved.  He continues to have a persistent leukocytosis we will repeat CBC in the morning.  Normocytic anemia: There is been a mild drop in hemoglobin from 10-18 likely hemodilution all, will continue IV fluids recheck a CBC in the morning.    History of peptic ulcer disease: Continue PPI twice daily.    DVT prophylaxis: SCD Family Communication:none Disposition Plan/Barrier to D/C: home in 2-3 days Code Status:     Code Status Orders  (From admission, onward)         Start     Ordered   05/31/18 2155  Full code  Continuous     05/31/18  2155        Code Status History    Date Active Date Inactive Code Status Order ID Comments User Context   05/28/2018 1710 05/31/2018 1742 Full Code [id number]  Lovenia Kim, MD Inpatient   05/27/2014 2153 05/31/2014 1644 Full Code [id number]  Berle Mull, MD Inpatient        IV Access:    Peripheral IV   Procedures and diagnostic studies:   Dg Chest 2 View  Result Date: 05/31/2018 CLINICAL DATA:  Cough.  Low-grade fever. EXAM: CHEST - 2 VIEW COMPARISON:  Chest radiograph 05/28/2018 FINDINGS: Monitoring leads overlie the patient. Stable cardiac and mediastinal contours. Interval development of patchy consolidative opacities within the right mid and lower lung and left lung base. No pleural effusion or pneumothorax. Thoracic spine degenerative changes. IMPRESSION: Interval development of patchy consolidative opacities within the right mid and lower lung and left lung base concerning for pneumonia in the appropriate clinical setting. Followup PA and lateral chest X-ray is recommended in 3-4 weeks following trial of antibiotic therapy to ensure resolution and exclude underlying malignancy. Electronically Signed   By: Lovey Newcomer M.D.   On: 05/31/2018 19:51     Medical Consultants:    None.  Anti-Infectives:   IV vancomycin and Zosyn.  Subjective:    Robert [name]   Objective:    Vitals:   06/01/18 0341 06/01/18 0400 06/01/18 0500 06/01/18 0600  BP:   94/61 96/75  Pulse:  74 72 72  Resp:  19 18 (!) 24  Temp: 98.4 F (36.9 C) 98.4 F (36.9 C)    TempSrc: Oral Oral    SpO2:  99% 100% 99%  Weight:      Height:        Intake/Output Summary (Last 24 hours) at 06/01/2018 0758 Last data filed at 06/01/2018 0501 Gross per 24 hour  Intake 47.6 ml  Output 1200 ml  Net -1152.4 ml   Filed Weights   05/31/18 1805 05/31/18 2300  Weight: 68 kg 68 kg    Exam: General exam: In no acute distress. Respiratory system: Good air movement and crackles on the right lower  lobe Cardiovascular system: S1 & S2 heard, RRR. No JVD. Gastrointestinal system: Abdomen is nondistended, soft and nontender.  Central nervous system: Alert and oriented. No focal neurological deficits. Extremities: No pedal edema. Skin: No rashes, lesions or ulcers Psychiatry: Judgement and insight appear normal. Mood & affect appropriate.    Data Reviewed:    Labs: Basic Metabolic Panel: Recent Labs  Lab 05/29/18 0443 05/30/18 0246 05/31/18 0504 05/31/18 1834 06/01/18 0425  NA 138 139 139 136 143  K 4.3 3.8 4.2 4.5 3.6  CL 109 108 106 100 111  CO2 23 24 24 25 25   GLUCOSE 80 119* 102* 94 114*  BUN 25* 15 13 16 13   CREATININE 1.12 1.05 1.08 1.08 0.94  CALCIUM 8.3* 8.4* 8.4* 8.5* 8.2*   GFR Estimated Creatinine Clearance: 81.1 mL/min (by C-G formula based on SCr of 0.94 mg/dL). Liver Function Tests: Recent Labs  Lab 05/29/18 0443 05/31/18 1834 06/01/18 0425  AST 29 39 24  ALT 22 26 20   ALKPHOS 45 53 51  BILITOT 1.4* 1.0 0.4  PROT 5.0* 6.0* 5.1*  ALBUMIN 2.6* 3.2* 2.7*   No results for input(s): LIPASE, AMYLASE in the last 168 hours. No results for input(s): AMMONIA in the last 168 hours. Coagulation profile Recent Labs  Lab 05/28/18 1609  INR 1.76    CBC: Recent Labs  Lab 05/29/18 0443 05/30/18 0246 05/31/18 0504 05/31/18 1834 06/01/18 0116 06/01/18 0713  WBC 6.7 6.0 5.4 15.6* 16.1*  --   NEUTROABS  --   --   --  12.4*  --   --   HGB 9.3* 9.4* 9.7* 10.2* 8.8* 8.7*  HCT 28.3* 29.5* 31.0* 30.2* 27.1* 26.5*  MCV 87.6 89.9 92.5 88.3 89.4  --   PLT 418* 422* 383 490* 423*  --    Cardiac Enzymes: No results for input(s): CKTOTAL, CKMB, CKMBINDEX, TROPONINI in the last 168 hours. BNP (last 3 results) No results for input(s): PROBNP in the last 8760 hours. CBG: No results for input(s): GLUCAP in the last 168 hours. D-Dimer: No results for input(s): DDIMER in the last 72 hours. Hgb A1c: No results for input(s): HGBA1C in the last 72 hours. Lipid  Profile: No results for input(s): CHOL, HDL, LDLCALC, TRIG, CHOLHDL, LDLDIRECT in the last 72 hours. Thyroid function studies: No results for input(s): TSH, T4TOTAL, T3FREE, THYROIDAB in the last 72 hours.  Invalid input(s): FREET3 Anemia work up: No results for input(s): VITAMINB12, FOLATE, FERRITIN, TIBC, IRON, RETICCTPCT in the last 72 hours. Sepsis Labs: Recent Labs  Lab 05/30/18 0246 05/31/18 0504 05/31/18 1834 05/31/18 1900 06/01/18 0116  WBC 6.0 5.4 15.6*  --  16.1*  LATICACIDVEN  --   --   --  1.67  --    Microbiology Recent Results (from the past 240 hour(s))  MRSA PCR Screening  Status: None   Collection Time: 05/28/18  6:00 PM  Result Value Ref Range Status   MRSA by PCR NEGATIVE NEGATIVE Final    Comment:        The GeneXpert MRSA Assay (FDA approved for NASAL specimens only), is one component of a comprehensive MRSA colonization surveillance program. It is not intended to diagnose MRSA infection nor to guide or monitor treatment for MRSA infections. Performed at [facility], [place] [address], [place], [place] [address]   Culture, blood (routine x 2)     Status: None (Preliminary result)   Collection Time: 05/31/18  6:34 PM  Result Value Ref Range Status   Specimen Description   Final    BLOOD BLOOD LEFT ARM Performed at [facility] [address] [place], [place] [address]    Special Requests   Final    BOTTLES DRAWN AEROBIC AND ANAEROBIC Blood Culture adequate volume Performed at [facility] [address] [place], [place] [address]    Culture   Final    NO GROWTH < 12 HOURS Performed at [facility] [address], [place], [place] [address]    Report Status PENDING  Incomplete  Culture, blood (routine x 2)     Status: None (Preliminary result)   Collection Time: 05/31/18  6:39 PM  Result Value Ref Range Status   Specimen Description   Final    BLOOD BLOOD RIGHT HAND Performed at [facility] [address], [place], [place] [address]    Special Requests   Final    BOTTLES DRAWN AEROBIC AND ANAEROBIC Blood Culture adequate volume Performed at [facility] [address] [place], [place] [address]    Culture   Final    NO GROWTH < 12 HOURS Performed at [facility] [address], [place], [place] [address]    Report Status PENDING  Incomplete  MRSA PCR Screening     Status: None   Collection Time: 06/01/18  1:59 AM  Result Value Ref Range Status   MRSA by PCR NEGATIVE NEGATIVE Final    Comment:        The GeneXpert MRSA Assay (FDA approved for NASAL specimens only), is one component of a comprehensive MRSA colonization surveillance program. It is not intended to diagnose MRSA infection nor to guide or monitor treatment for MRSA infections. Performed at [facility], [place] [address], [place], [place] [address]      Medications:   . albuterol  2.5 mg Nebulization Q6H WA  . DULoxetine  60 mg Oral BID  . ferrous sulfate  325 mg Oral QHS  . gabapentin  600 mg Oral TID  . levothyroxine  50 mcg Oral QAC breakfast  . pantoprazole  40 mg Oral BID  . tamsulosin  0.4 mg Oral BID  . tiotropium  18 mcg Inhalation Daily  . vitamin B-12  200 mcg Oral Daily   Continuous Infusions: . sodium chloride 100 mL/hr at 05/31/18 2300  . ceFEPime (MAXIPIME) IV Stopped (06/01/18 0531)  . vancomycin       LOS: 1 day   Charlynne Cousins  [facility] Pager [phone]  *Please refer to [email or web address], password [email or web address] to get updated schedule on who will round on this patient, as hospitalists switch teams weekly. If 7PM-7AM, please contact night-coverage at [email or web address], password [email or web address] for any overnight needs.  06/01/2018, 7:58 AM

## 2018-06-02 ENCOUNTER — Other Ambulatory Visit: Payer: Self-pay

## 2018-06-02 LAB — CBC
HCT: 25.8 % — ABNORMAL LOW (ref 39.0–52.0)
Hemoglobin: 8.5 g/dL — ABNORMAL LOW (ref 13.0–17.0)
MCH: 29.4 pg (ref 26.0–34.0)
MCHC: 32.9 g/dL (ref 30.0–36.0)
MCV: 89.3 fL (ref 78.0–100.0)
PLATELETS: 379 10*3/uL (ref 150–400)
RBC: 2.89 MIL/uL — AB (ref 4.22–5.81)
RDW: 15.9 % — AB (ref 11.5–15.5)
WBC: 11.6 10*3/uL — ABNORMAL HIGH (ref 4.0–10.5)

## 2018-06-02 LAB — URINE CULTURE: Culture: NO GROWTH

## 2018-06-02 MED ORDER — LEVOFLOXACIN 500 MG PO TABS
500.0000 mg | ORAL_TABLET | Freq: Every day | ORAL | Status: DC
  Administered 2018-06-02 – 2018-06-04 (×3): 500 mg via ORAL
  Filled 2018-06-02 (×3): qty 1

## 2018-06-02 MED ORDER — GABAPENTIN 400 MG PO CAPS
400.0000 mg | ORAL_CAPSULE | Freq: Three times a day (TID) | ORAL | Status: DC
  Administered 2018-06-02 – 2018-06-07 (×16): 400 mg via ORAL
  Filled 2018-06-02 (×16): qty 1

## 2018-06-02 MED ORDER — GUAIFENESIN-DM 100-10 MG/5ML PO SYRP
5.0000 mL | ORAL_SOLUTION | ORAL | Status: DC | PRN
  Administered 2018-06-02: 09:00:00 via ORAL
  Administered 2018-06-02 – 2018-06-06 (×7): 5 mL via ORAL
  Filled 2018-06-02 (×8): qty 10

## 2018-06-02 NOTE — Progress Notes (Signed)
[facility] PROGRESS NOTE    Progress Note  Robert [name]  ZRA:[id number] DOB: Feb 09, 1961 DOA: 05/31/2018 PCP: Patient, No Pcp Per     Brief Narrative:   Robert [name] is an 57 y.o. male past medical history of hypothyroidism, hepatitis C with cirrhosis history of PE and DVT  and COPD on home O2 discharged from: Today for symptomatic anemia and upper GI bleed due to NSAIDs causing a duodenal ulcer apparently was discharged home and was desaturating with ambulation came into the ED was found to have a fever hypoxic with a white count of 15, chest x-ray showed patchy consolidation in the right mid and lower lobe  Assessment/Plan:   Acute on chronic respiratory failure with hypoxia due to HCAP (healthcare-associated pneumonia): Saturations have remained stable, we will transition his IV antibiotics to oral Levaquin.  He has defervesced culture data has remained negative. He has remained.  Greater than 90% on 2 L of oxygen which is his baseline  Sepsis [facility] is associated pneumonia: Fluid resuscitated on admission his vital signs remained stable. We will transition his antibiotics to oral Levaquin.  Normocytic anemia: There is been a mild drop in hemoglobin from 10-18 likely hemodilution all, will continue IV fluids recheck a CBC in the morning.    History of peptic ulcer disease: Continue PPI twice daily.    DVT prophylaxis: SCD Family Communication:none Disposition Plan/Barrier to D/C: home in a.m. Code Status:     Code Status Orders  (From admission, onward)         Start     Ordered   05/31/18 2155  Full code  Continuous     05/31/18 2155        Code Status History    Date Active Date Inactive Code Status Order ID Comments User Context   05/28/2018 1710 05/31/2018 1742 Full Code [id number]  Lovenia Kim, MD Inpatient   05/27/2014 2153 05/31/2014 1644 Full Code [id number]  Berle Mull, MD Inpatient        IV Access:    Peripheral  IV   Procedures and diagnostic studies:   Dg Chest 2 View  Result Date: 05/31/2018 CLINICAL DATA:  Cough.  Low-grade fever. EXAM: CHEST - 2 VIEW COMPARISON:  Chest radiograph 05/28/2018 FINDINGS: Monitoring leads overlie the patient. Stable cardiac and mediastinal contours. Interval development of patchy consolidative opacities within the right mid and lower lung and left lung base. No pleural effusion or pneumothorax. Thoracic spine degenerative changes. IMPRESSION: Interval development of patchy consolidative opacities within the right mid and lower lung and left lung base concerning for pneumonia in the appropriate clinical setting. Followup PA and lateral chest X-ray is recommended in 3-4 weeks following trial of antibiotic therapy to ensure resolution and exclude underlying malignancy. Electronically Signed   By: Lovey Newcomer M.D.   On: 05/31/2018 19:51     Medical Consultants:    None.  Anti-Infectives:   Oral Levaquin.  Subjective:    Robert [name] is no new complaints today except for his cough he relates his breathing is better  Objective:    Vitals:   06/02/18 0500 06/02/18 0600 06/02/18 0734 06/02/18 0801  BP: (!) 153/87 (!) 144/91    Pulse: (!) 108 (!) 106    Resp:  (!) 22    Temp:   98.4 F (36.9 C)   TempSrc:   Oral   SpO2: 97% 96%  100%  Weight:      Height:  Intake/Output Summary (Last 24 hours) at 06/02/2018 0809 Last data filed at 06/02/2018 0600 Gross per 24 hour  Intake 1477.49 ml  Output 2350 ml  Net -872.51 ml   Filed Weights   05/31/18 1805 05/31/18 2300  Weight: 68 kg 68 kg    Exam: General exam: In no acute distress. Respiratory system: Good air movement and continues to have persistent crackles in the right lower lobe. Cardiovascular system: S1 & S2 heard, RRR. No JVD. Gastrointestinal system: Abdomen is nondistended, soft and nontender.  Central nervous system: Alert and oriented. No focal neurological  deficits. Extremities: No pedal edema. Skin: No rashes, lesions or ulcers Psychiatry: Judgement and insight appear normal. Mood & affect appropriate.    Data Reviewed:    Labs: Basic Metabolic Panel: Recent Labs  Lab 05/29/18 0443 05/30/18 0246 05/31/18 0504 05/31/18 1834 06/01/18 0425  NA 138 139 139 136 143  K 4.3 3.8 4.2 4.5 3.6  CL 109 108 106 100 111  CO2 23 24 24 25 25   GLUCOSE 80 119* 102* 94 114*  BUN 25* 15 13 16 13   CREATININE 1.12 1.05 1.08 1.08 0.94  CALCIUM 8.3* 8.4* 8.4* 8.5* 8.2*   GFR Estimated Creatinine Clearance: 81.1 mL/min (by C-G formula based on SCr of 0.94 mg/dL). Liver Function Tests: Recent Labs  Lab 05/29/18 0443 05/31/18 1834 06/01/18 0425  AST 29 39 24  ALT 22 26 20   ALKPHOS 45 53 51  BILITOT 1.4* 1.0 0.4  PROT 5.0* 6.0* 5.1*  ALBUMIN 2.6* 3.2* 2.7*   No results for input(s): LIPASE, AMYLASE in the last 168 hours. No results for input(s): AMMONIA in the last 168 hours. Coagulation profile Recent Labs  Lab 05/28/18 1609  INR 1.76    CBC: Recent Labs  Lab 05/30/18 0246 05/31/18 0504 05/31/18 1834 06/01/18 0116 06/01/18 0713 06/02/18 0346  WBC 6.0 5.4 15.6* 16.1*  --  11.6*  NEUTROABS  --   --  12.4*  --   --   --   HGB 9.4* 9.7* 10.2* 8.8* 8.7* 8.5*  HCT 29.5* 31.0* 30.2* 27.1* 26.5* 25.8*  MCV 89.9 92.5 88.3 89.4  --  89.3  PLT 422* 383 490* 423*  --  379   Cardiac Enzymes: No results for input(s): CKTOTAL, CKMB, CKMBINDEX, TROPONINI in the last 168 hours. BNP (last 3 results) No results for input(s): PROBNP in the last 8760 hours. CBG: No results for input(s): GLUCAP in the last 168 hours. D-Dimer: No results for input(s): DDIMER in the last 72 hours. Hgb A1c: No results for input(s): HGBA1C in the last 72 hours. Lipid Profile: No results for input(s): CHOL, HDL, LDLCALC, TRIG, CHOLHDL, LDLDIRECT in the last 72 hours. Thyroid function studies: No results for input(s): TSH, T4TOTAL, T3FREE, THYROIDAB in the  last 72 hours.  Invalid input(s): FREET3 Anemia work up: No results for input(s): VITAMINB12, FOLATE, FERRITIN, TIBC, IRON, RETICCTPCT in the last 72 hours. Sepsis Labs: Recent Labs  Lab 05/31/18 0504 05/31/18 1834 05/31/18 1900 06/01/18 0116 06/02/18 0346  WBC 5.4 15.6*  --  16.1* 11.6*  LATICACIDVEN  --   --  1.67  --   --    Microbiology Recent Results (from the past 240 hour(s))  MRSA PCR Screening     Status: None   Collection Time: 05/28/18  6:00 PM  Result Value Ref Range Status   MRSA by PCR NEGATIVE NEGATIVE Final    Comment:        The GeneXpert MRSA Assay (FDA  approved for NASAL specimens only), is one component of a comprehensive MRSA colonization surveillance program. It is not intended to diagnose MRSA infection nor to guide or monitor treatment for MRSA infections. Performed at [facility], [place] [address] [place], [place] [address]   Culture, blood (routine x 2)     Status: None (Preliminary result)   Collection Time: 05/31/18  6:34 PM  Result Value Ref Range Status   Specimen Description   Final    BLOOD BLOOD LEFT ARM Performed at [facility] [address] [place], [place] [address]    Special Requests   Final    BOTTLES DRAWN AEROBIC AND ANAEROBIC Blood Culture adequate volume Performed at [facility] [address] [place], [place] [address]    Culture   Final    NO GROWTH 2 DAYS Performed at [facility] [address], [place], [place] [address]    Report Status PENDING  Incomplete  Culture, blood (routine x 2)     Status: None (Preliminary result)   Collection Time: 05/31/18  6:39 PM  Result Value Ref Range Status   Specimen Description   Final    BLOOD BLOOD RIGHT HAND Performed at [facility] [address] [place], [place] [address]    Special Requests   Final    BOTTLES DRAWN AEROBIC AND ANAEROBIC Blood Culture adequate volume Performed at [facility] [address], [place], [place] [address]    Culture   Final    NO GROWTH 2 DAYS Performed at [facility] [address], [place], [place] [address]    Report Status PENDING  Incomplete  MRSA PCR Screening     Status: None   Collection Time: 06/01/18  1:59 AM  Result Value Ref Range Status   MRSA by PCR NEGATIVE NEGATIVE Final    Comment:        The GeneXpert MRSA Assay (FDA approved for NASAL specimens only), is one component of a comprehensive MRSA colonization surveillance program. It is not intended to diagnose MRSA infection nor to guide or monitor treatment for MRSA infections. Performed at [facility], [place] [address] [place],  [address]      Medications:   . albuterol  2.5 mg Nebulization TID  . DULoxetine  60 mg Oral BID  . ferrous sulfate  325 mg Oral QHS  . gabapentin  600 mg Oral TID  . levothyroxine  50 mcg Oral QAC breakfast  . pantoprazole  40 mg Oral BID  . tamsulosin  0.4 mg Oral BID  . tiotropium  18 mcg Inhalation Daily  . vitamin B-12  200 mcg Oral Daily   Continuous Infusions: . ceFEPime (MAXIPIME) IV Stopped (06/02/18 0550)     LOS: 2 days   Charlynne Cousins  [facility] Pager [phone]  *Please refer to [email or web address], password [email or web address] to get updated schedule on who will round on this patient, as hospitalists switch teams weekly. If 7PM-7AM, please contact night-coverage at [email or web address], password [email or web address] for any overnight needs.  06/02/2018, 8:09 AM

## 2018-06-02 NOTE — Care Management Note (Signed)
Case Management Note  Patient Details  Name: Robert [name] MRN: [id number] Date of Birth: Feb 28, 1961  Subjective/Objective:                   57 y.o. male past medical history of hypothyroidism, hepatitis C with cirrhosis history of PE and DVT  and COPD on home O2 discharged from: Today for symptomatic anemia and upper GI bleed due to NSAIDs causing a duodenal ulcer apparently was discharged home and was desaturating with ambulation came into the ED was found to have a fever hypoxic with a white count of 15, chest x-ray showed patchy consolidation in the right mid and lower lobe  Action/Plan: Goal Length of Stay: Ambulatory or 2 days  Note: Goal Length of Stay assumes optimal recovery, decision making, and care. Patients may be discharged to a lower level of care (either later than or sooner than the goal) when it is appropriate for their clinical status and care needs. Discharge Readiness Return to top of Pneumonia RRG - ISC  Discharge readiness is indicated by patient meeting Recovery Milestones, including ALL of the following: ? Hemodynamic stability- 99.3,112, resp 27/bp 153/96 ? Tachypnea absent hr-112 ? Hypoxemia absent o2 via nasal cannual ? Afebrile, or temperature acceptable for next level of care no ? Oxygen absent or at baseline need no ? Mental status at baseline yes ? Antibiotic regimen acceptable for next level of care/yes ? Ambulatory/yes ? Oral hydration, medications, and diet ? Reg. Diet, no ivs flds ? pna in both lower lobes via cxr.   Expected Discharge Date:  06/05/18               Expected Discharge Plan:  Skilled Nursing Facility  In-House Referral:  Clinical Social Work  Discharge planning Services  CM Consult  Post Acute Care Choice:    Choice offered to:     DME Arranged:    DME Agency:     HH Arranged:    [place] Agency:     Status of Service:  In process, will continue to follow  If discussed at Long Length of Stay Meetings, dates discussed:     Additional Comments:  Leeroy Cha, RN 06/02/2018, 12:50 PM

## 2018-06-03 ENCOUNTER — Encounter: Payer: Self-pay | Admitting: Gastroenterology

## 2018-06-03 LAB — LEGIONELLA PNEUMOPHILA SEROGP 1 UR AG: L. pneumophila Serogp 1 Ur Ag: NEGATIVE

## 2018-06-03 MED ORDER — NICOTINE 7 MG/24HR TD PT24
7.0000 mg | MEDICATED_PATCH | Freq: Every day | TRANSDERMAL | Status: DC
  Administered 2018-06-03 (×2): 7 mg via TRANSDERMAL
  Filled 2018-06-03 (×2): qty 1

## 2018-06-03 MED ORDER — SODIUM CHLORIDE 0.9 % IV BOLUS
1000.0000 mL | Freq: Once | INTRAVENOUS | Status: AC
  Administered 2018-06-03: 1000 mL via INTRAVENOUS

## 2018-06-03 MED ORDER — SODIUM CHLORIDE 0.9 % IV SOLN
INTRAVENOUS | Status: DC
  Administered 2018-06-03 – 2018-06-07 (×8): via INTRAVENOUS

## 2018-06-03 MED ORDER — OXYCODONE HCL 5 MG PO TABS
5.0000 mg | ORAL_TABLET | ORAL | Status: DC | PRN
  Administered 2018-06-03 – 2018-06-04 (×6): 5 mg via ORAL
  Filled 2018-06-03 (×6): qty 1

## 2018-06-03 NOTE — Progress Notes (Signed)
[facility] PROGRESS NOTE    Progress Note  Robert [name]  CVE:[id number] DOB: [birth year] DOA: 05/31/2018 PCP: Patient, No Pcp Per     Brief Narrative:   Robert [name] is an 57 y.o. male past medical history of hypothyroidism, hepatitis C with cirrhosis history of PE and DVT  and COPD on home O2 discharged from: Today for symptomatic anemia and upper GI bleed due to NSAIDs causing a duodenal ulcer apparently was discharged home and was desaturating with ambulation came into the ED was found to have a fever hypoxic with a white count of 15, chest x-ray showed patchy consolidation in the right mid and lower lobe  Assessment/Plan:   Acute on chronic respiratory failure with hypoxia due to HCAP (healthcare-associated pneumonia): Tolerating abx, some nausea. Cont levaquin. He relates he is nauseated not able to tolerate his food.  Sepsis [facility] is associated pneumonia: Fluid resuscitated on admission his vital signs remained stable. We will transition his antibiotics to oral Levaquin.  Normocytic anemia: There is been a mild drop in hemoglobin from 10-18 likely hemodilution all, will continue IV fluids recheck a CBC in the morning.    History of peptic ulcer disease: Continue PPI twice daily.  Nausea likely due to orthostatic hypotension: Check orthostatic, fluid resuscitate him and recheck in am. Will observe for an additional 24 hrs.  DVT prophylaxis: SCD Family Communication:none Disposition Plan/Barrier to D/C: home in a.m. Code Status:     Code Status Orders  (From admission, onward)         Start     Ordered   05/31/18 2155  Full code  Continuous     05/31/18 2155        Code Status History    Date Active Date Inactive Code Status Order ID Comments User Context   05/28/2018 1710 05/31/2018 1742 Full Code [id number]  Lovenia Kim, MD Inpatient   05/27/2014 2153 05/31/2014 1644 Full Code [id number]  Berle Mull, MD Inpatient        IV  Access:    Peripheral IV   Procedures and diagnostic studies:   No results found.   Medical Consultants:    None.  Anti-Infectives:   Oral Levaquin.  Subjective:    Robert [name] is feeling nauseated dizzy upon standing.  Objective:    Vitals:   06/02/18 1401 06/02/18 2106 06/03/18 0448 06/03/18 0724  BP: 112/78  108/72   Pulse: 93  (!) 104   Resp:   20   Temp: 98.7 F (37.1 C)  98.5 F (36.9 C)   TempSrc: Oral  Oral   SpO2: 96% 97% 96% 96%  Weight:      Height:        Intake/Output Summary (Last 24 hours) at 06/03/2018 1034 Last data filed at 06/03/2018 0100 Gross per 24 hour  Intake 240 ml  Output 1700 ml  Net -1460 ml   Filed Weights   05/31/18 1805 05/31/18 2300  Weight: 68 kg 68 kg    Exam: General exam: In no acute distress. Respiratory system: Good air movement and continues to have persistent crackles in the right lower lobe. Cardiovascular system: S1 & S2 heard, RRR. No JVD. Gastrointestinal system: Abdomen is nondistended, soft and nontender.  Central nervous system: Alert and oriented. No focal neurological deficits. Extremities: No pedal edema. Skin: No rashes, lesions or ulcers Psychiatry: Judgement and insight appear normal. Mood & affect appropriate.    Data Reviewed:    Labs: Basic Metabolic Panel: Recent  Labs  Lab 05/29/18 0443 05/30/18 0246 05/31/18 0504 05/31/18 1834 06/01/18 0425  NA 138 139 139 136 143  K 4.3 3.8 4.2 4.5 3.6  CL 109 108 106 100 111  CO2 23 24 24 25 25   GLUCOSE 80 119* 102* 94 114*  BUN 25* 15 13 16 13   CREATININE 1.12 1.05 1.08 1.08 0.94  CALCIUM 8.3* 8.4* 8.4* 8.5* 8.2*   GFR Estimated Creatinine Clearance: 81.1 mL/min (by C-G formula based on SCr of 0.94 mg/dL). Liver Function Tests: Recent Labs  Lab 05/29/18 0443 05/31/18 1834 06/01/18 0425  AST 29 39 24  ALT 22 26 20   ALKPHOS 45 53 51  BILITOT 1.4* 1.0 0.4  PROT 5.0* 6.0* 5.1*  ALBUMIN 2.6* 3.2* 2.7*   No results for  input(s): LIPASE, AMYLASE in the last 168 hours. No results for input(s): AMMONIA in the last 168 hours. Coagulation profile Recent Labs  Lab 05/28/18 1609  INR 1.76    CBC: Recent Labs  Lab 05/30/18 0246 05/31/18 0504 05/31/18 1834 06/01/18 0116 06/01/18 0713 06/02/18 0346  WBC 6.0 5.4 15.6* 16.1*  --  11.6*  NEUTROABS  --   --  12.4*  --   --   --   HGB 9.4* 9.7* 10.2* 8.8* 8.7* 8.5*  HCT 29.5* 31.0* 30.2* 27.1* 26.5* 25.8*  MCV 89.9 92.5 88.3 89.4  --  89.3  PLT 422* 383 490* 423*  --  379   Cardiac Enzymes: No results for input(s): CKTOTAL, CKMB, CKMBINDEX, TROPONINI in the last 168 hours. BNP (last 3 results) No results for input(s): PROBNP in the last 8760 hours. CBG: No results for input(s): GLUCAP in the last 168 hours. D-Dimer: No results for input(s): DDIMER in the last 72 hours. Hgb A1c: No results for input(s): HGBA1C in the last 72 hours. Lipid Profile: No results for input(s): CHOL, HDL, LDLCALC, TRIG, CHOLHDL, LDLDIRECT in the last 72 hours. Thyroid function studies: No results for input(s): TSH, T4TOTAL, T3FREE, THYROIDAB in the last 72 hours.  Invalid input(s): FREET3 Anemia work up: No results for input(s): VITAMINB12, FOLATE, FERRITIN, TIBC, IRON, RETICCTPCT in the last 72 hours. Sepsis Labs: Recent Labs  Lab 05/31/18 0504 05/31/18 1834 05/31/18 1900 06/01/18 0116 06/02/18 0346  WBC 5.4 15.6*  --  16.1* 11.6*  LATICACIDVEN  --   --  1.67  --   --    Microbiology Recent Results (from the past 240 hour(s))  MRSA PCR Screening     Status: None   Collection Time: 05/28/18  6:00 PM  Result Value Ref Range Status   MRSA by PCR NEGATIVE NEGATIVE Final    Comment:        The GeneXpert MRSA Assay (FDA approved for NASAL specimens only), is one component of a comprehensive MRSA colonization surveillance program. It is not intended to diagnose MRSA infection nor to guide or monitor treatment for MRSA infections. Performed at [facility], [place] [address] [place], [place] [address]   Culture, blood (routine x 2)     Status: None (Preliminary result)   Collection Time: 05/31/18  6:34 PM  Result Value Ref Range Status   Specimen Description   Final    BLOOD BLOOD LEFT ARM Performed at [facility] [address], [place], [place] [address]    Special Requests   Final    BOTTLES DRAWN AEROBIC AND ANAEROBIC Blood Culture adequate volume Performed at [facility] [address] [place], [place]  [address]    Culture   Final    NO GROWTH 2 DAYS Performed at [facility], [place] [address], [place], [place] [address]    Report Status PENDING  Incomplete  Culture, blood (routine x 2)     Status: None (Preliminary result)   Collection Time: 05/31/18  6:39 PM  Result Value Ref Range Status   Specimen Description   Final    BLOOD BLOOD RIGHT HAND Performed at [facility] [address] [place], [place] [address]    Special Requests   Final    BOTTLES DRAWN AEROBIC AND ANAEROBIC Blood Culture adequate volume Performed at [facility] [address], [place], [place] [address]    Culture   Final    NO GROWTH 2 DAYS Performed at [facility] [address], [place], [place] [address]    Report Status PENDING  Incomplete  Urine culture     Status: None   Collection Time: 05/31/18  7:51 PM  Result Value Ref Range Status   Specimen Description   Final    URINE, RANDOM Performed at [facility] [address], [place], [place] [address]    Special Requests   Final    NONE Performed at [facility], [place] [address], [place], [place] [address]    Culture   Final    NO GROWTH Performed at [facility], [place] [address], [place], [place] [address]    Report Status 06/02/2018 FINAL  Final  MRSA PCR Screening     Status: None   Collection Time: 06/01/18  1:59 AM  Result Value Ref  Range Status   MRSA by PCR NEGATIVE NEGATIVE Final    Comment:        The GeneXpert MRSA Assay (FDA approved for NASAL specimens only), is one component of a comprehensive MRSA colonization surveillance program. It is not intended to diagnose MRSA infection nor to guide or monitor treatment for MRSA infections. Performed at [facility], [place] [address], [place], [place] [address]      Medications:   . albuterol  2.5 mg Nebulization TID  . DULoxetine  60 mg Oral BID  . ferrous sulfate  325 mg Oral QHS  . gabapentin  400 mg Oral TID  . levofloxacin  500 mg Oral Daily  . levothyroxine  50 mcg Oral QAC breakfast  . nicotine  7 mg Transdermal QHS  . pantoprazole  40 mg Oral BID  . tamsulosin  0.4 mg Oral BID  . tiotropium  18 mcg Inhalation Daily   Continuous Infusions: . sodium chloride    . sodium chloride       LOS: 3 days   Charlynne Cousins  [facility] Pager [phone]  *Please refer to [email or web address], password [email or web address] to get updated schedule on who will round on this patient, as hospitalists switch teams weekly. If 7PM-7AM, please contact night-coverage at [email or web address], password [email or web address] for any overnight needs.  06/03/2018, 10:34 AM

## 2018-06-03 NOTE — Clinical Social Work Note (Signed)
Patient assessed on 8/30. No significant changes since last assessment.   Patient to dc to [facility] when stable.   LCSW will follow.   Carolin Coy [place] CSW [phone]  Clinical Social Work Assessment  Patient Details  Name: Robert [name] MRN: [id number] Date of Birth: November 03, 1960  Date of referral:                  Reason for consult:                   Permission sought to share information with:    Permission granted to share information::     Name::        Agency::     Relationship::     Contact Information:     Housing/Transportation Living arrangements for the past 2 months:    Source of Information:    Patient Interpreter Needed:    Criminal Activity/Legal Involvement Pertinent to Current Situation/Hospitalization:    Significant Relationships:    Lives with:    Do you feel safe going back to the place where you live?    Need for family participation in patient care:     Care giving concerns:  CSW received consult for possible SNF placement at time of discharge. CSW spoke with patient regarding PT recommendation of SNF placement at time of discharge. Patient reported that he lives in a halfway house and given patient's current physical needs and fall risk he cannot take care of himself. Patient expressed understanding of PT recommendation and is agreeable to SNF placement at time of discharge. CSW to continue to follow and assist with discharge planning needs.   Social Worker assessment / plan:  CSW spoke with patient concerning possibility of rehab at [facility] before returning home.  Employment status:    Insurance information:    PT Recommendations:    Information / Referral to community resources:     Patient/Family's Response to care:  Patient recognizes need for rehab before returning home and is agreeable to a SNF in [place]. CSW explained limited Medicaid bed offers and will have a liaison come to evaluate patient. He states he  receives disability and is willing to sign check over to SNF and stay a minimum of 30 days.   Patient/Family's Understanding of and Emotional Response to Diagnosis, Current Treatment, and Prognosis:  Patient/family is realistic regarding therapy needs and expressed being hopeful for SNF placement. Patient expressed understanding of CSW role and discharge process as well as medical condition. No questions/concerns about plan or treatment.    Emotional Assessment Appearance:    Attitude/Demeanor/Rapport:    Affect (typically observed):    Orientation:    Alcohol / Substance use:    Psych involvement (Current and /or in the community):     Discharge Needs  Concerns to be addressed:    Readmission within the last 30 days:    Current discharge risk:    Barriers to Discharge:      Servando Snare, LCSW 06/03/2018, 1:07 PM

## 2018-06-03 NOTE — Progress Notes (Signed)
Patient will dc to [facility] when medically stable. LCSW will follow.   Carolin Coy [place]

## 2018-06-04 ENCOUNTER — Other Ambulatory Visit: Payer: Self-pay

## 2018-06-04 DIAGNOSIS — D649 Anemia, unspecified: Secondary | ICD-10-CM

## 2018-06-04 DIAGNOSIS — K269 Duodenal ulcer, unspecified as acute or chronic, without hemorrhage or perforation: Secondary | ICD-10-CM

## 2018-06-04 DIAGNOSIS — J9621 Acute and chronic respiratory failure with hypoxia: Secondary | ICD-10-CM

## 2018-06-04 DIAGNOSIS — I951 Orthostatic hypotension: Secondary | ICD-10-CM | POA: Diagnosis present

## 2018-06-04 DIAGNOSIS — Z1211 Encounter for screening for malignant neoplasm of colon: Secondary | ICD-10-CM

## 2018-06-04 DIAGNOSIS — R338 Other retention of urine: Secondary | ICD-10-CM

## 2018-06-04 DIAGNOSIS — J189 Pneumonia, unspecified organism: Secondary | ICD-10-CM

## 2018-06-04 LAB — CBC WITH DIFFERENTIAL/PLATELET
BASOS PCT: 1 %
Basophils Absolute: 0 10*3/uL (ref 0.0–0.1)
EOS ABS: 0.4 10*3/uL (ref 0.0–0.7)
Eosinophils Relative: 6 %
HCT: 28 % — ABNORMAL LOW (ref 39.0–52.0)
Hemoglobin: 9.3 g/dL — ABNORMAL LOW (ref 13.0–17.0)
Lymphocytes Relative: 17 %
Lymphs Abs: 1 10*3/uL (ref 0.7–4.0)
MCH: 29.1 pg (ref 26.0–34.0)
MCHC: 33.2 g/dL (ref 30.0–36.0)
MCV: 87.5 fL (ref 78.0–100.0)
MONO ABS: 1.1 10*3/uL — AB (ref 0.1–1.0)
Monocytes Relative: 19 %
Neutro Abs: 3.4 10*3/uL (ref 1.7–7.7)
Neutrophils Relative %: 57 %
Platelets: 483 10*3/uL — ABNORMAL HIGH (ref 150–400)
RBC: 3.2 MIL/uL — ABNORMAL LOW (ref 4.22–5.81)
RDW: 15.5 % (ref 11.5–15.5)
WBC: 6 10*3/uL (ref 4.0–10.5)

## 2018-06-04 LAB — BASIC METABOLIC PANEL
Anion gap: 7 (ref 5–15)
BUN: 9 mg/dL (ref 6–20)
CALCIUM: 8.8 mg/dL — AB (ref 8.9–10.3)
CO2: 25 mmol/L (ref 22–32)
CREATININE: 0.88 mg/dL (ref 0.61–1.24)
Chloride: 109 mmol/L (ref 98–111)
GFR calc Af Amer: >60 mL/min (ref >60)
GFR calc non Af Amer: >60 mL/min (ref >60)
GLUCOSE: 88 mg/dL (ref 70–99)
Potassium: 4 mmol/L (ref 3.5–5.1)
Sodium: 141 mmol/L (ref 135–145)

## 2018-06-04 MED ORDER — TAMSULOSIN HCL 0.4 MG PO CAPS: 0.4000 mg | ORAL_CAPSULE | Freq: Every day | ORAL | Status: DC

## 2018-06-04 MED ORDER — NICOTINE 14 MG/24HR TD PT24
14.0000 mg | MEDICATED_PATCH | Freq: Every day | TRANSDERMAL | Status: DC
  Administered 2018-06-04 – 2018-06-07 (×4): 14 mg via TRANSDERMAL
  Filled 2018-06-04 (×4): qty 1

## 2018-06-04 MED ORDER — LEVOFLOXACIN 750 MG PO TABS
750.0000 mg | ORAL_TABLET | Freq: Every day | ORAL | Status: DC
  Administered 2018-06-05 – 2018-06-07 (×3): 750 mg via ORAL
  Filled 2018-06-04 (×3): qty 1

## 2018-06-04 MED ORDER — LEVOFLOXACIN 250 MG PO TABS
250.0000 mg | ORAL_TABLET | Freq: Once | ORAL | Status: AC
  Administered 2018-06-04: 250 mg via ORAL
  Filled 2018-06-04: qty 1

## 2018-06-04 MED ORDER — LIDOCAINE 5 % EX PTCH
1.0000 | MEDICATED_PATCH | CUTANEOUS | Status: DC
  Administered 2018-06-04 – 2018-06-07 (×4): 1 via TRANSDERMAL
  Filled 2018-06-04 (×4): qty 1

## 2018-06-04 MED ORDER — OXYCODONE HCL 5 MG PO TABS
10.0000 mg | ORAL_TABLET | Freq: Four times a day (QID) | ORAL | Status: DC | PRN
  Administered 2018-06-04 – 2018-06-05 (×4): 10 mg via ORAL
  Filled 2018-06-04 (×5): qty 2

## 2018-06-04 MED ORDER — SODIUM CHLORIDE 0.9 % IV BOLUS
1000.0000 mL | Freq: Once | INTRAVENOUS | Status: AC
  Administered 2018-06-04: 1000 mL via INTRAVENOUS

## 2018-06-04 NOTE — Evaluation (Signed)
Physical Therapy Evaluation Patient Details Name: Robert [name] MRN: [id number] DOB: [birth year] Today's Date: 06/04/2018   History of Present Illness  57 y.o. male with PMHx significant for Hypothyroidism, Hep C, cirrhosis, Hypertension, CVA, h/o PE/ DVT (recurrent), Asthma/ Copd on home O2, chronic neck and back pain, just discharged from [facility] for symptomatic anemia from acute GI bleed due to NSAID use causing PUD (duodenal) and admitted 05/31/18 for acute on chronic respiratory failure with hypoxia due to HCAP  Clinical Impression  Pt admitted with above diagnosis. Pt currently with functional limitations due to the deficits listed below (see PT Problem List).  Pt will benefit from skilled PT to increase their independence and safety with mobility to allow discharge to the venue listed below.  Pt reports hx of R THA approximately 2 months ago and has been having R LE weakness and spasms with standing which has limited his mobility.  Pt reports pain and buckling with standing too long.  Pt states he just returned from bathroom and had to wait a few minutes on toilet before being able to stand up again (also did not have nursing with him so bed alarm set prior to leaving room) so he declined standing activity at this time.  Orthostatics also obtained by RN prior to visit.  Pt reports hx of dizziness with mobility for about 4-5 years.  Pt also had quad cane in room and uses on his R side, which therapist educated as incorrect use for R LE however pt prefers this method. Pt reports he plans to d/c to SNF for safety and to improve his mobility prior to d/c home.       Follow Up Recommendations SNF;Supervision for mobility/OOB    Equipment Recommendations  None recommended by PT    Recommendations for Other Services       Precautions / Restrictions Precautions Precautions: Fall Precaution Comments: orthostatic  SpO2 96% on room air with sitting however pt reports SOB and self reapplied 2L O2 [place]  (states he is supposed to be wearing all the time however did not have nasal cannula in place on arrival)     Mobility  Bed Mobility Overal bed mobility: Needs Assistance Bed Mobility: Supine to Sit;Sit to Supine     Supine to sit: Supervision;HOB elevated Sit to supine: HOB elevated;Supervision      Transfers                 General transfer comment: NT due to pt fatigue   Ambulation/Gait                Stairs            Wheelchair Mobility    Modified Rankin (Stroke Patients Only)       Balance Overall balance assessment: Needs assistance;History of Falls Sitting-balance support: No upper extremity supported;Feet supported Sitting balance-Leahy Scale: Fair                                       Pertinent Vitals/Pain Pain Assessment: 0-10 Pain Score: 7  Pain Location: all over Pain Descriptors / Indicators: Aching;Discomfort;Grimacing Pain Intervention(s): Limited activity within patient's tolerance;Repositioned;Monitored during session    Home Living Family/patient expects to be discharged to:: Skilled nursing facility   Available Help at Discharge: Available PRN/intermittently;Friend(s) Type of Home: House Home Access: Ramped entrance     Home Layout: One level Home Equipment: Gilford Rile -  4 wheels;Cane - quad      Prior Function Level of Independence: Needs assistance   Gait / Transfers Assistance Needed: Uses rollator for household ambulation or cane. Stays in room most of the time, limited mobility. pt reports multiple falls in the past month           Hand Dominance        Extremity/Trunk Assessment   Upper Extremity Assessment Upper Extremity Assessment: Generalized weakness;RUE deficits/detail RUE Deficits / Details: reports R shoulder pain lately after fall about a week ago, also hx of cervical neck surgery    Lower Extremity Assessment Lower Extremity Assessment: RLE deficits/detail;LLE  deficits/detail RLE Deficits / Details: muscle spasms present with any MMT of R LE in sitting, pt unable to maintain resistance RLE Sensation: decreased light touch LLE Deficits / Details: grossly at least 3+/5 LLE Sensation: decreased light touch       Communication   Communication: No difficulties  Cognition Arousal/Alertness: Awake/alert Behavior During Therapy: WFL for tasks assessed/performed Overall Cognitive Status: No family/caregiver present to determine baseline cognitive functioning                                 General Comments: most likely baseline, tangential with speech, very talkative      General Comments      Exercises     Assessment/Plan    PT Assessment Patient needs continued PT services  PT Problem List Decreased strength;Decreased mobility;Decreased activity tolerance;Cardiopulmonary status limiting activity;Pain;Decreased balance;Impaired sensation;Decreased knowledge of use of DME       PT Treatment Interventions Functional mobility training;Balance training;Patient/family education;Gait training;Therapeutic activities;Therapeutic exercise;Wheelchair mobility training;DME instruction    PT Goals (Current goals can be found in the Care Plan section)  Acute Rehab PT Goals PT Goal Formulation: With patient Time For Goal Achievement: 06/18/18 Potential to Achieve Goals: Good    Frequency Min 2X/week   Barriers to discharge        Co-evaluation               AM-PAC PT "6 Clicks" Daily Activity  Outcome Measure Difficulty turning over in bed (including adjusting bedclothes, sheets and blankets)?: None Difficulty moving from lying on back to sitting on the side of the bed? : None Difficulty sitting down on and standing up from a chair with arms (e.g., wheelchair, bedside commode, etc,.)?: A Little Help needed moving to and from a bed to chair (including a wheelchair)?: A Little Help needed walking in hospital room?: A  Lot Help needed climbing 3-5 steps with a railing? : A Lot 6 Click Score: 18    End of Session Equipment Utilized During Treatment: Oxygen Activity Tolerance: Patient limited by fatigue Patient left: in bed;with call bell/phone within reach;with bed alarm set Nurse Communication: Mobility status PT Visit Diagnosis: Difficulty in walking, not elsewhere classified (R26.2);History of falling (Z91.81)    Time: 7902-4097 PT Time Calculation (min) (ACUTE ONLY): 22 min   Charges:   PT Evaluation $PT Eval Low Complexity: 1 Low         Carmelia Bake, PT, DPT 06/04/2018 Pager: [phone]   York Ram E 06/04/2018, 3:57 PM

## 2018-06-04 NOTE — Progress Notes (Signed)
Patient had 475 cc's post void residual.  Dr. Grandville Silos notified with order to in and out cath prn.

## 2018-06-04 NOTE — Progress Notes (Signed)
PROGRESS NOTE    Antonius Hartlage  ZOX:[id number] DOB: 1961/06/18 DOA: 05/31/2018 PCP: Patient, No Pcp Per    Brief Narrative:  Essam Lowdermilk is an 57 y.o. male past medical history of hypothyroidism, hepatitis C with cirrhosis history of PE and DVT  and COPD on home O2 discharged for symptomatic anemia and upper GI bleed due to NSAIDs causing a duodenal ulcer apparently was discharged home and was desaturating with ambulation came into the ED was found to have a fever hypoxic with a white count of 15, chest x-ray showed patchy consolidation in the right mid and lower lobe   Assessment & Plan:   Principal Problem:   HCAP (healthcare-associated pneumonia) Active Problems:   Hypotension   Normocytic anemia   Sepsis ([place])   Tachycardia   Fever   Acute on chronic respiratory failure with hypoxia (HCC)   Orthostasis   Multifocal pneumonia   Acute urinary retention  1 acute on chronic respiratory failure with hypoxia secondary to multilobar pneumonia/HCAP/sepsis Patient admitted with hypoxia, fever, leukocytosis, chest x-ray consistent with multifocal pneumonia.  Blood cultures negative to date.  MRSA PCR negative.  Sputum Gram stain and cultures pending.  Fever curve trending down.  Leukocytosis trending down.  Patient was on IV vancomycin IV cefepime and has been transitioned to oral Levaquin.  Increase Levaquin dose to 750 mg daily.  Continue albuterol nebs.  Continue Spiriva.  2.  Orthostatic hypotension Patient noted to have significant orthostasis this morning with complaints of dizziness and lightheadedness and symptomatic.  Check a CBC to follow H&H as patient noted to have recent duodenal ulcer with anemia.  Discontinue Flomax as patient was on this twice daily.  Give her IV fluid bolus.  Place on IV fluids. TED hose. Follow.  3.  Peptic ulcer disease/duodenal ulcer secondary to NSAID use Patient with no overt bleeding.  Check a CBC to follow H&H.  Continue PPI twice  daily.  4.  Anemia Likely secondary to problem #3.  Check a CBC.  Follow H&H.  5.  Chronic pain Patient states has a history of chronic pain secondary to cervical disc disease and chronic low back pain.  Patient states was on oxycodone 10 mg as needed however currently on 5 mg as needed.  Increase back to home regimen of oxycodone 10 mg as needed for pain.  Resume Lidoderm patch as patient was on days prior to admission.  Pain management.  Follow.  6.  History of urinary retention Patient noted to be on Flomax twice daily during this hospitalization.  Patient with good urine output however concerned that patient is not emptying out his bladder well.  Patient also noted to have significant orthostatic hypotension as such Flomax has been discontinued.  Postvoid residual approximately 400 cc per RN.  Due to symptomatic orthostatic hypotension Flomax has been discontinued.  I and O cath as needed.Check PVR.  Will likely need outpatient follow-up with urology.    DVT prophylaxis: SCDs Code Status: Full Family Communication: Updated patient.  No family at bedside. Disposition Plan: Likely skilled nursing facility versus home with home health once orthostasis has resolved and patient asymptomatic..   Consultants:   None  Procedures:   Chest x-ray 05/31/2018    Antimicrobials:   IV cefepime 05/31/2018>>>> 06/02/2018  IV vancomycin 05/31/2018>>>> 06/01/2018  Levaquin 06/02/2018   Subjective: Patient complain of significant chronic back and neck pain.  Patient noted to be significantly orthostatic by RN this morning with complaints of dizziness and lightheadedness.  No shortness of breath.  No chest pain.  No overt bleeding.  Objective: Vitals:   06/03/18 1942 06/03/18 2009 06/04/18 0749 06/04/18 1354  BP:  (!) 150/92  (!) 143/120  Pulse:  88  86  Resp:  18    Temp:  (!) 97.5 F (36.4 C)  98.6 F (37 C)  TempSrc:    Oral  SpO2: 99% 100% 97% 97%  Weight:      Height:         Intake/Output Summary (Last 24 hours) at 06/04/2018 1738 Last data filed at 06/04/2018 1700 Gross per 24 hour  Intake 3095.44 ml  Output 2875 ml  Net 220.44 ml   Filed Weights   05/31/18 1805 05/31/18 2300  Weight: 68 kg 68 kg    Examination:  General exam: Appears calm and comfortable  Respiratory system: Clear to auscultation. Respiratory effort normal. Cardiovascular system: S1 & S2 heard, RRR. No JVD, murmurs, rubs, gallops or clicks. No pedal edema. Gastrointestinal system: Abdomen is nondistended, soft and nontender. No organomegaly or masses felt. Normal bowel sounds heard. Central nervous system: Alert and oriented. No focal neurological deficits. Extremities: Symmetric 5 x 5 power. Skin: No rashes, lesions or ulcers Psychiatry: Judgement and insight appear normal. Mood & affect appropriate.     Data Reviewed: I have personally reviewed following labs and imaging studies  CBC: Recent Labs  Lab 05/31/18 0504 05/31/18 1834 06/01/18 0116 06/01/18 0713 06/02/18 0346 06/04/18 0910  WBC 5.4 15.6* 16.1*  --  11.6* 6.0  NEUTROABS  --  12.4*  --   --   --  3.4  HGB 9.7* 10.2* 8.8* 8.7* 8.5* 9.3*  HCT 31.0* 30.2* 27.1* 26.5* 25.8* 28.0*  MCV 92.5 88.3 89.4  --  89.3 87.5  PLT 383 490* 423*  --  379 378*   Basic Metabolic Panel: Recent Labs  Lab 05/30/18 0246 05/31/18 0504 05/31/18 1834 06/01/18 0425 06/04/18 0627  NA 139 139 136 143 141  K 3.8 4.2 4.5 3.6 4.0  CL 108 106 100 111 109  CO2 24 24 25 25 25   GLUCOSE 119* 102* 94 114* 88  BUN 15 13 16 13 9   CREATININE 1.05 1.08 1.08 0.94 0.88  CALCIUM 8.4* 8.4* 8.5* 8.2* 8.8*   GFR: Estimated Creatinine Clearance: 86.6 mL/min (by C-G formula based on SCr of 0.88 mg/dL). Liver Function Tests: Recent Labs  Lab 05/29/18 0443 05/31/18 1834 06/01/18 0425  AST 29 39 24  ALT 22 26 20   ALKPHOS 45 53 51  BILITOT 1.4* 1.0 0.4  PROT 5.0* 6.0* 5.1*  ALBUMIN 2.6* 3.2* 2.7*   No results for input(s): LIPASE,  AMYLASE in the last 168 hours. No results for input(s): AMMONIA in the last 168 hours. Coagulation Profile: No results for input(s): INR, PROTIME in the last 168 hours. Cardiac Enzymes: No results for input(s): CKTOTAL, CKMB, CKMBINDEX, TROPONINI in the last 168 hours. BNP (last 3 results) No results for input(s): PROBNP in the last 8760 hours. HbA1C: No results for input(s): HGBA1C in the last 72 hours. CBG: No results for input(s): GLUCAP in the last 168 hours. Lipid Profile: No results for input(s): CHOL, HDL, LDLCALC, TRIG, CHOLHDL, LDLDIRECT in the last 72 hours. Thyroid Function Tests: No results for input(s): TSH, T4TOTAL, FREET4, T3FREE, THYROIDAB in the last 72 hours. Anemia Panel: No results for input(s): VITAMINB12, FOLATE, FERRITIN, TIBC, IRON, RETICCTPCT in the last 72 hours. Sepsis Labs: Recent Labs  Lab 05/31/18 1900  LATICACIDVEN 1.67  Recent Results (from the past 240 hour(s))  MRSA PCR Screening     Status: None   Collection Time: 05/28/18  6:00 PM  Result Value Ref Range Status   MRSA by PCR NEGATIVE NEGATIVE Final    Comment:        The GeneXpert MRSA Assay (FDA approved for NASAL specimens only), is one component of a comprehensive MRSA colonization surveillance program. It is not intended to diagnose MRSA infection nor to guide or monitor treatment for MRSA infections. Performed at [facility], [place] [address] [place], [place] [address]   Culture, blood (routine x 2)     Status: None (Preliminary result)   Collection Time: 05/31/18  6:34 PM  Result Value Ref Range Status   Specimen Description   Final    BLOOD BLOOD LEFT ARM Performed at [place] [address] [place], [place] [address]    Special Requests   Final    BOTTLES DRAWN AEROBIC AND ANAEROBIC Blood Culture adequate volume Performed at [place] [address], [place], [place] [address]    Culture   Final    NO GROWTH 4  DAYS Performed at [facility], [place] [address] [place], [place] [address]    Report Status PENDING  Incomplete  Culture, blood (routine x 2)     Status: None (Preliminary result)   Collection Time: 05/31/18  6:39 PM  Result Value Ref Range Status   Specimen Description   Final    BLOOD BLOOD RIGHT HAND Performed at [place] [address] [place], [place] [address]    Special Requests   Final    BOTTLES DRAWN AEROBIC AND ANAEROBIC Blood Culture adequate volume Performed at [place] [address] [place], [place] [address]    Culture   Final    NO GROWTH 4 DAYS Performed at [facility], [place] [address], [place], [place] [address]    Report Status PENDING  Incomplete  Urine culture     Status: None   Collection Time: 05/31/18  7:51 PM  Result Value Ref Range Status   Specimen Description   Final    URINE, RANDOM Performed at [place] [address] [place], [place] [address]    Special Requests   Final    NONE Performed at [facility], [place] [address] [place], [place] [address]    Culture   Final    NO GROWTH Performed at [facility], [place] [address], [place], [place] [address]    Report Status 06/02/2018 FINAL  Final  MRSA PCR Screening     Status: None   Collection Time: 06/01/18  1:59 AM  Result Value Ref Range Status   MRSA by PCR NEGATIVE NEGATIVE Final    Comment:        The GeneXpert MRSA Assay (FDA approved for NASAL specimens only), is one component of a comprehensive MRSA colonization surveillance program. It is not intended to diagnose MRSA infection nor to guide or monitor treatment for MRSA infections. Performed at [facility], [place] [address] [place],  [address]          Radiology Studies: No results found.      Scheduled Meds: . albuterol  2.5 mg Nebulization TID  . DULoxetine  60 mg Oral BID  .  ferrous sulfate  325 mg Oral QHS  . gabapentin  400 mg  Oral TID  . levofloxacin  250 mg Oral Once  . [START ON 06/05/2018] levofloxacin  750 mg Oral Daily  . levothyroxine  50 mcg Oral QAC breakfast  . lidocaine  1 patch Transdermal Q24H  . nicotine  14 mg Transdermal Daily  . pantoprazole  40 mg Oral BID  . tiotropium  18 mcg Inhalation Daily   Continuous Infusions: . sodium chloride 100 mL/hr at 06/04/18 1030     LOS: 4 days    Time spent: 35 mins    Irine Seal, MD [facility] Pager [phone]  If 7PM-7AM, please contact night-coverage [email or web address] Password [email or web address] 06/04/2018, 5:38 PM

## 2018-06-05 LAB — CULTURE, BLOOD (ROUTINE X 2)
CULTURE: NO GROWTH
Culture: NO GROWTH
SPECIAL REQUESTS: ADEQUATE
SPECIAL REQUESTS: ADEQUATE

## 2018-06-05 LAB — CBC WITH DIFFERENTIAL/PLATELET
BASOS ABS: 0 10*3/uL (ref 0.0–0.1)
BASOS PCT: 1 %
EOS PCT: 5 %
Eosinophils Absolute: 0.4 10*3/uL (ref 0.0–0.7)
HCT: 30.4 % — ABNORMAL LOW (ref 39.0–52.0)
HEMOGLOBIN: 10.1 g/dL — AB (ref 13.0–17.0)
Lymphocytes Relative: 19 %
Lymphs Abs: 1.4 10*3/uL (ref 0.7–4.0)
MCH: 29.1 pg (ref 26.0–34.0)
MCHC: 33.2 g/dL (ref 30.0–36.0)
MCV: 87.6 fL (ref 78.0–100.0)
Monocytes Absolute: 1.1 10*3/uL — ABNORMAL HIGH (ref 0.1–1.0)
Monocytes Relative: 15 %
NEUTROS PCT: 60 %
Neutro Abs: 4.4 10*3/uL (ref 1.7–7.7)
Platelets: 468 10*3/uL — ABNORMAL HIGH (ref 150–400)
RBC: 3.47 MIL/uL — AB (ref 4.22–5.81)
RDW: 15.4 % (ref 11.5–15.5)
WBC: 7.3 10*3/uL (ref 4.0–10.5)

## 2018-06-05 LAB — BASIC METABOLIC PANEL
Anion gap: 9 (ref 5–15)
BUN: 9 mg/dL (ref 6–20)
CHLORIDE: 105 mmol/L (ref 98–111)
CO2: 28 mmol/L (ref 22–32)
CREATININE: 0.83 mg/dL (ref 0.61–1.24)
Calcium: 8.9 mg/dL (ref 8.9–10.3)
GFR calc Af Amer: >60 mL/min (ref >60)
GFR calc non Af Amer: >60 mL/min (ref >60)
GLUCOSE: 93 mg/dL (ref 70–99)
Potassium: 3.6 mmol/L (ref 3.5–5.1)
Sodium: 142 mmol/L (ref 135–145)

## 2018-06-05 MED ORDER — POLYETHYLENE GLYCOL 3350 17 G PO PACK
17.0000 g | PACK | Freq: Two times a day (BID) | ORAL | Status: DC
  Administered 2018-06-05 – 2018-06-06 (×2): 17 g via ORAL
  Filled 2018-06-05 (×4): qty 1

## 2018-06-05 MED ORDER — HYDROXYZINE HCL 25 MG PO TABS
25.0000 mg | ORAL_TABLET | Freq: Three times a day (TID) | ORAL | Status: DC | PRN
  Administered 2018-06-05 – 2018-06-06 (×2): 25 mg via ORAL
  Filled 2018-06-05 (×2): qty 1

## 2018-06-05 MED ORDER — OXYCODONE HCL 5 MG PO TABS
10.0000 mg | ORAL_TABLET | ORAL | Status: DC | PRN
  Administered 2018-06-05 – 2018-06-07 (×11): 10 mg via ORAL
  Filled 2018-06-05 (×11): qty 2

## 2018-06-05 MED ORDER — SENNOSIDES-DOCUSATE SODIUM 8.6-50 MG PO TABS
1.0000 | ORAL_TABLET | Freq: Two times a day (BID) | ORAL | Status: DC
  Administered 2018-06-05 – 2018-06-06 (×2): 1 via ORAL
  Filled 2018-06-05 (×4): qty 1

## 2018-06-05 NOTE — Progress Notes (Signed)
OT Cancellation Note  Patient Details Name: Robert [name] MRN: [id number] DOB: 1961/04/18   Cancelled Treatment:    Reason Eval/Treat Not Completed: Other (comment). Pt reports feeling "terrible, just awful" "can't get my blood pressure under control." Pt noted to be very anxious with fast, repetitive speech. "I feel like I'm going to have a nervous breakdown." Nursing notified. Plan to reattempt later today.   Tyrone Schimke OTR/L Pager: [phone]  06/05/2018, 10:15 AM

## 2018-06-05 NOTE — Progress Notes (Signed)
Pt voided 600cc, clear yellow odorless urine. PVR 400 cc. Pt states he does not feel like he has to void and again and does not want to be cath at the moment. Pt states he will void later. Will recheck bladder scan and if pt has abdominal distention  With next Q2h rounds.

## 2018-06-05 NOTE — Progress Notes (Signed)
Pt. just starting dinner tray, in no stated/apparent distress, made aware to notify when done if needed before RT makes stop back on pm rounds for aerosol tx.

## 2018-06-05 NOTE — Progress Notes (Signed)
PROGRESS NOTE    Robert [name]  MOL:[id number] DOB: October 18, 1960 DOA: 05/31/2018 PCP: Patient, No Pcp Per    Brief Narrative:  Robert [name] is an 57 y.o. male past medical history of hypothyroidism, hepatitis C with cirrhosis history of PE and DVT  and COPD on home O2 discharged for symptomatic anemia and upper GI bleed due to NSAIDs causing a duodenal ulcer apparently was discharged home and was desaturating with ambulation came into the ED was found to have a fever hypoxic with a white count of 15, chest x-ray showed patchy consolidation in the right mid and lower lobe   Assessment & Plan:   Principal Problem:   HCAP (healthcare-associated pneumonia) Active Problems:   Hypotension   Normocytic anemia   Sepsis ([place])   Tachycardia   Fever   Acute on chronic respiratory failure with hypoxia (HCC)   Orthostasis   Multifocal pneumonia   Acute urinary retention  1 acute on chronic respiratory failure with hypoxia secondary to multilobar pneumonia/HCAP/sepsis Patient admitted with hypoxia, fever, leukocytosis, chest x-ray consistent with multifocal pneumonia.  Blood cultures negative to date.  MRSA PCR negative.  Sputum Gram stain and cultures pending.  Fever curve trending down.  Leukocytosis trendedd down.  Patient was on IV vancomycin IV cefepime and has been transitioned to oral Levaquin.  Increased Levaquin dose to 750 mg daily on 06/04/2018.Marland Kitchen  Continue albuterol nebs.  Continue Spiriva.  2.  Orthostatic hypotension Patient noted to have significant orthostasis the morning of 06/04/2018 with significant symptoms of dizziness and lightheadedness.  Patient still orthostatic today 06/05/2018 however symptoms slowly improving with still some dizziness and some lightheadedness from supine to sitting position.  Flomax has been discontinued.  Hemoglobin stable.  Patient on Levaquin for pneumonia and afebrile.  Continue IV fluids, TED hose.  Follow.  Repeat orthostatics in the morning.   Follow.  3.  Peptic ulcer disease/duodenal ulcer secondary to NSAID use Patient with no overt bleeding.  Hemoglobin 10.1.  Continue PPI twice daily.  Follow.  4.  Anemia Likely secondary to problem #3.  Hemoglobin currently at 10.1.  Follow.    5.  Chronic pain Patient states has a history of chronic pain secondary to cervical disc disease and chronic low back pain.  Patient states was on oxycodone 10 mg as needed however was on 5 mg as needed.  Continue oxycodone 10 mg as needed for pain.  Continue Lidoderm patch.  Continue Cymbalta.  Continue Neurontin.  Outpatient follow-up. Follow.  6.  History of urinary retention Patient noted to be on Flomax twice daily during this hospitalization.  Patient with good urine output however concerned that patient was not emptying out his bladder well.  Patient also noted to have significant orthostatic hypotension as such Flomax has been discontinued.  Postvoid residual approximately 400 cc per RN.  Due to symptomatic orthostatic hypotension Flomax has been discontinued.  I and O cath as needed.patient noted to void 375 cc of clear yellow odorless urine early this morning with a postvoid residual of 441 cc.  Patient with normal renal function.  No signs of hydronephrosis.  Continue I andO cath as needed.  Will need outpatient follow-up with urology.  Case discussed with urology over the telephone.    DVT prophylaxis: SCDs Code Status: Full Family Communication: Updated patient and family at bedside. Disposition Plan: Likely skilled nursing facility versus home with home health once orthostasis has resolved and patient asymptomatic..   Consultants:   None  Procedures:  Chest x-ray 05/31/2018    Antimicrobials:   IV cefepime 05/31/2018>>>> 06/02/2018  IV vancomycin 05/31/2018>>>> 06/01/2018  Levaquin 06/02/2018   Subjective: Patient with complaints of chronic back and neck pain however states some improvement after adjustment with pain  medications.  Patient still orthostatic however symptoms improving.  No chest pain.  No shortness of breath.  No overt bleeding.  Patient complaining of itching.  Objective: Vitals:   06/04/18 1919 06/04/18 2209 06/05/18 0412 06/05/18 0900  BP:  (!) 153/98 (!) 135/92   Pulse:   89   Resp:   20   Temp:   98 F (36.7 C)   TempSrc:      SpO2: 97% 100% 97% 98%  Weight:      Height:        Intake/Output Summary (Last 24 hours) at 06/05/2018 1204 Last data filed at 06/05/2018 1000 Gross per 24 hour  Intake 3936.79 ml  Output 3975 ml  Net -38.21 ml   Filed Weights   05/31/18 1805 05/31/18 2300  Weight: 68 kg 68 kg    Examination:  General exam: NAD Respiratory system: CTAB.  No wheezes, no crackles, no rhonchi.  Respiratory effort normal. Cardiovascular system: Regular rate and rhythm no murmurs rubs or gallops.  No JVD.  No lower extremity edema.  Gastrointestinal system: Abdomen is soft, nontender, nondistended, positive bowel sounds.  No rebound.  No guarding. Central nervous system: Alert and oriented. No focal neurological deficits. Extremities: Symmetric 5 x 5 power. Skin: No rashes, lesions or ulcers Psychiatry: Judgement and insight appear normal. Mood & affect appropriate.     Data Reviewed: I have personally reviewed following labs and imaging studies  CBC: Recent Labs  Lab 05/31/18 1834 06/01/18 0116 06/01/18 0713 06/02/18 0346 06/04/18 0910 06/05/18 0636  WBC 15.6* 16.1*  --  11.6* 6.0 7.3  NEUTROABS 12.4*  --   --   --  3.4 4.4  HGB 10.2* 8.8* 8.7* 8.5* 9.3* 10.1*  HCT 30.2* 27.1* 26.5* 25.8* 28.0* 30.4*  MCV 88.3 89.4  --  89.3 87.5 87.6  PLT 490* 423*  --  379 483* 588*   Basic Metabolic Panel: Recent Labs  Lab 05/31/18 0504 05/31/18 1834 06/01/18 0425 06/04/18 0627 06/05/18 0636  NA 139 136 143 141 142  K 4.2 4.5 3.6 4.0 3.6  CL 106 100 111 109 105  CO2 24 25 25 25 28   GLUCOSE 102* 94 114* 88 93  BUN 13 16 13 9 9   CREATININE 1.08 1.08 0.94  0.88 0.83  CALCIUM 8.4* 8.5* 8.2* 8.8* 8.9   GFR: Estimated Creatinine Clearance: 91.8 mL/min (by C-G formula based on SCr of 0.83 mg/dL). Liver Function Tests: Recent Labs  Lab 05/31/18 1834 06/01/18 0425  AST 39 24  ALT 26 20  ALKPHOS 53 51  BILITOT 1.0 0.4  PROT 6.0* 5.1*  ALBUMIN 3.2* 2.7*   No results for input(s): LIPASE, AMYLASE in the last 168 hours. No results for input(s): AMMONIA in the last 168 hours. Coagulation Profile: No results for input(s): INR, PROTIME in the last 168 hours. Cardiac Enzymes: No results for input(s): CKTOTAL, CKMB, CKMBINDEX, TROPONINI in the last 168 hours. BNP (last 3 results) No results for input(s): PROBNP in the last 8760 hours. HbA1C: No results for input(s): HGBA1C in the last 72 hours. CBG: No results for input(s): GLUCAP in the last 168 hours. Lipid Profile: No results for input(s): CHOL, HDL, LDLCALC, TRIG, CHOLHDL, LDLDIRECT in the last 72 hours. Thyroid Function  Tests: No results for input(s): TSH, T4TOTAL, FREET4, T3FREE, THYROIDAB in the last 72 hours. Anemia Panel: No results for input(s): VITAMINB12, FOLATE, FERRITIN, TIBC, IRON, RETICCTPCT in the last 72 hours. Sepsis Labs: Recent Labs  Lab 05/31/18 1900  LATICACIDVEN 1.67    Recent Results (from the past 240 hour(s))  MRSA PCR Screening     Status: None   Collection Time: 05/28/18  6:00 PM  Result Value Ref Range Status   MRSA by PCR NEGATIVE NEGATIVE Final    Comment:        The GeneXpert MRSA Assay (FDA approved for NASAL specimens only), is one component of a comprehensive MRSA colonization surveillance program. It is not intended to diagnose MRSA infection nor to guide or monitor treatment for MRSA infections. Performed at [facility], [place] [address] [place], [place] [address]   Culture, blood (routine x 2)     Status: None   Collection Time: 05/31/18  6:34 PM  Result Value Ref Range Status   Specimen Description   Final    BLOOD BLOOD LEFT  ARM Performed at [place] [address] [place], [place] [address]    Special Requests   Final    BOTTLES DRAWN AEROBIC AND ANAEROBIC Blood Culture adequate volume Performed at [place] [address], [place], [place] [address]    Culture   Final    NO GROWTH 5 DAYS Performed at [facility], [place] [address] [place], [place] [address]    Report Status 06/05/2018 FINAL  Final  Culture, blood (routine x 2)     Status: None   Collection Time: 05/31/18  6:39 PM  Result Value Ref Range Status   Specimen Description   Final    BLOOD BLOOD RIGHT HAND Performed at [place] [address], [place], [place] [address]    Special Requests   Final    BOTTLES DRAWN AEROBIC AND ANAEROBIC Blood Culture adequate volume Performed at [place] [address] [place], [place] [address]    Culture   Final    NO GROWTH 5 DAYS Performed at [facility], [place] [address], [place], [place] [address]    Report Status 06/05/2018 FINAL  Final  Urine culture     Status: None   Collection Time: 05/31/18  7:51 PM  Result Value Ref Range Status   Specimen Description   Final    URINE, RANDOM Performed at [place] [address], [place], [place] [address]    Special Requests   Final    NONE Performed at [facility], [place] [address] [place], [place] [address]    Culture   Final    NO GROWTH Performed at [facility], [name] [address] [place], [place] [address]    Report Status 06/02/2018 FINAL  Final  MRSA PCR Screening     Status: None   Collection Time: 06/01/18  1:59 AM  Result Value Ref Range Status   MRSA by PCR NEGATIVE NEGATIVE Final    Comment:        The GeneXpert MRSA Assay (FDA approved for NASAL specimens only), is one component of a comprehensive MRSA colonization surveillance program. It is not intended to diagnose  MRSA infection nor to guide or monitor treatment for MRSA infections. Performed at [facility], [place] [address] [place],  [address]  Radiology Studies: No results found.      Scheduled Meds: . albuterol  2.5 mg Nebulization TID  . DULoxetine  60 mg Oral BID  . ferrous sulfate  325 mg Oral QHS  . gabapentin  400 mg Oral TID  . levofloxacin  750 mg Oral Daily  . levothyroxine  50 mcg Oral QAC breakfast  . lidocaine  1 patch Transdermal Q24H  . nicotine  14 mg Transdermal Daily  . pantoprazole  40 mg Oral BID  . polyethylene glycol  17 g Oral BID  . senna-docusate  1 tablet Oral BID  . tiotropium  18 mcg Inhalation Daily   Continuous Infusions: . sodium chloride 100 mL/hr at 06/05/18 0746     LOS: 5 days    Time spent: 35 mins    Irine Seal, MD [facility] Pager [phone]  If 7PM-7AM, please contact night-coverage [email or web address] Password [email or web address] 06/05/2018, 12:04 PM

## 2018-06-05 NOTE — Progress Notes (Signed)
Pt voided 375cc clear, yellow, odorless urine. PVR 441cc. Pt refused cath again, he states he will attempt to urinate again. abd non-distended, no c/o pain, burning with urination.

## 2018-06-05 NOTE — Progress Notes (Signed)
Patient has bed at [facility] when medically stable.    Carolin Coy [place]

## 2018-06-05 NOTE — NC FL2 (Signed)
[place] LEVEL OF CARE SCREENING TOOL     IDENTIFICATION  Patient Name: Robert [name] Birthdate: July 22, 1961 Sex: male Admission Date (Current Location): 05/31/2018  [facility] and [place] Number:  [employer] and Address:  [facility] [place]. [facility], [place] [address], [place], [place] [address]      Provider Number: [id number]  Attending Physician Name and Address:  Eugenie Filler, MD  Relative Name and Phone Number:       Current Level of Care: Hospital Recommended Level of Care: [place] Prior Approval Number:    Date Approved/Denied: 06/05/18 PASRR Number: [id number]  Discharge Plan: SNF    Current Diagnoses: Patient Active Problem List   Diagnosis Date Noted  . Orthostasis 06/04/2018  . Multifocal pneumonia   . Acute urinary retention   . Acute on chronic respiratory failure with hypoxia ([place]) 06/01/2018  . HCAP (healthcare-associated pneumonia) 05/31/2018  . Sepsis ([place]) 05/31/2018  . Tachycardia 05/31/2018  . Fever 05/31/2018  . Duodenal ulcer   . Chronic pain syndrome   . Acute GI bleeding 05/28/2018  . Hypotension   . Normocytic anemia   . Liver cirrhosis ([place])   . Disturbance of skin sensation 05/29/2014  . Cellulitis 05/27/2014  . Cellulitis of left hand 05/27/2014  . Chronically on opiate therapy 05/27/2014  . Chronic anticoagulation 05/27/2014  . COPD (chronic obstructive pulmonary disease) ([place]) 04/17/2012  . Leg pain, right 04/17/2012  . Low back pain 04/17/2012  . Hepatitis C 04/17/2012  . Adynamic ileus ([place]) 04/17/2012  . Liver cancer ([place])   . Lung cancer ([place])   . Pulmonary emboli (H[facility])     Orientation RESPIRATION BLADDER Height & Weight     Self, Time, Situation, Place  O2 Continent Weight: 149 lb 14.6 oz (68 kg) Height:  5\' 7"  (170.2 cm)  BEHAVIORAL SYMPTOMS/MOOD NEUROLOGICAL BOWEL NUTRITION STATUS      Continent Diet(See dc summary)  AMBULATORY STATUS COMMUNICATION OF NEEDS  Skin   Extensive Assist Verbally Surgical wounds(right hip incision )                       Personal Care Assistance Level of Assistance  Bathing, Feeding, Dressing Bathing Assistance: Limited assistance Feeding assistance: Independent Dressing Assistance: Limited assistance     Functional Limitations Info  Sight, Hearing, Speech Sight Info: Adequate Hearing Info: Adequate Speech Info: Adequate    SPECIAL CARE FACTORS FREQUENCY  PT (By licensed PT), OT (By licensed OT)     PT Frequency: 5x/week OT Frequency: 5x/week            Contractures Contractures Info: Not present    Additional Factors Info  Allergies, Code Status Code Status Info: Full Allergies Info: Ciprofloxacin, Penicillins, Adhesive  Tape, Morphine And Related           Current Medications (06/05/2018):  This is the current hospital active medication list Current Facility-Administered Medications  Medication Dose Route Frequency Provider Last Rate Last Dose  . 0.9 %  sodium chloride infusion   Intravenous Continuous Eugenie Filler, MD 100 mL/hr at 06/05/18 0746    . albuterol (PROVENTIL) (2.5 MG/3ML) 0.083% nebulizer solution 2.5 mg  2.5 mg Nebulization Q6H PRN Charlynne Cousins, MD   2.5 mg at 06/02/18 0440  . albuterol (PROVENTIL) (2.5 MG/3ML) 0.083% nebulizer solution 2.5 mg  2.5 mg Nebulization TID Charlynne Cousins, MD   2.5 mg at 06/05/18 0900  . DULoxetine (CYMBALTA) DR capsule  60 mg  60 mg Oral BID Charlynne Cousins, MD   60 mg at 06/04/18 2210  . ferrous sulfate tablet 325 mg  325 mg Oral QHS Charlynne Cousins, MD   325 mg at 06/04/18 2210  . gabapentin (NEURONTIN) capsule 400 mg  400 mg Oral TID Charlynne Cousins, MD   400 mg at 06/04/18 2210  . guaiFENesin-dextromethorphan (ROBITUSSIN DM) 100-10 MG/5ML syrup 5 mL  5 mL Oral Q4H PRN Charlynne Cousins, MD   5 mL at 06/04/18 2216  . levofloxacin (LEVAQUIN) tablet 750 mg  750 mg Oral Daily Eugenie Filler, MD      .  levothyroxine (SYNTHROID, LEVOTHROID) tablet 50 mcg  50 mcg Oral QAC breakfast Charlynne Cousins, MD   50 mcg at 06/05/18 0743  . lidocaine (LIDODERM) 5 % 1 patch  1 patch Transdermal Q24H Eugenie Filler, MD   1 patch at 06/04/18 1322  . methocarbamol (ROBAXIN) tablet 750 mg  750 mg Oral TID PRN Charlynne Cousins, MD   750 mg at 06/04/18 2216  . nicotine (NICODERM CQ - dosed in mg/24 hours) patch 14 mg  14 mg Transdermal Daily Eugenie Filler, MD   14 mg at 06/04/18 1322  . oxyCODONE (Oxy IR/ROXICODONE) immediate release tablet 10 mg  10 mg Oral Q6H PRN Eugenie Filler, MD   10 mg at 06/05/18 0743  . pantoprazole (PROTONIX) EC tablet 40 mg  40 mg Oral BID Charlynne Cousins, MD   40 mg at 06/04/18 2210  . polyethylene glycol (MIRALAX / GLYCOLAX) packet 17 g  17 g Oral BID Eugenie Filler, MD      . senna-docusate (Senokot-S) tablet 1 tablet  1 tablet Oral BID Eugenie Filler, MD      . tiotropium [facility]) inhalation capsule 18 mcg  18 mcg Inhalation Daily Charlynne Cousins, MD   18 mcg at 06/05/18 0900     Discharge Medications: Please see discharge summary for a list of discharge medications.  Relevant Imaging Results:  Relevant Lab Results:   Additional Information SSN [id number]  Servando Snare, LCSW

## 2018-06-06 LAB — BASIC METABOLIC PANEL
Anion gap: 9 (ref 5–15)
BUN: 10 mg/dL (ref 6–20)
CHLORIDE: 106 mmol/L (ref 98–111)
CO2: 26 mmol/L (ref 22–32)
Calcium: 8.5 mg/dL — ABNORMAL LOW (ref 8.9–10.3)
Creatinine, Ser: 0.87 mg/dL (ref 0.61–1.24)
GFR calc Af Amer: >60 mL/min (ref >60)
GFR calc non Af Amer: >60 mL/min (ref >60)
GLUCOSE: 89 mg/dL (ref 70–99)
POTASSIUM: 4 mmol/L (ref 3.5–5.1)
Sodium: 141 mmol/L (ref 135–145)

## 2018-06-06 NOTE — Evaluation (Signed)
Occupational Therapy Evaluation Patient Details Name: Robert [name] MRN: [id number] DOB: 30-Nov-1960 Today's Date: 06/06/2018    History of Present Illness 57 y.o. male with PMHx significant for Hypothyroidism, Hep C, cirrhosis, Hypertension, CVA, h/o PE/ DVT (recurrent), Asthma/ Copd on home O2, chronic neck and back pain, just discharged from [facility] for symptomatic anemia from acute GI bleed due to NSAID use causing PUD (duodenal) and admitted 05/31/18 for acute on chronic respiratory failure with hypoxia due to HCAP   Clinical Impression   Pt admitted with the above. Pt currently with functional limitations due to the deficits listed below (see OT Problem List).  Pt will benefit from skilled OT to increase their safety and independence with ADL and functional mobility for ADL to facilitate discharge to venue listed below.      Follow Up Recommendations  SNF;Supervision/Assistance - 24 hour    Equipment Recommendations  3 in 1 bedside commode       Precautions / Restrictions Precautions Precautions: Fall Precaution Comments: home O2 Restrictions Weight Bearing Restrictions: No      Mobility Bed Mobility Overal bed mobility: Needs Assistance Bed Mobility: Supine to Sit;Sit to Supine     Supine to sit: Supervision Sit to supine: Supervision   General bed mobility comments: Increased time to get to EOB. Use of rail. Guarded movement.   Transfers Overall transfer level: Needs assistance Equipment used: None;Standard walker Transfers: Stand Pivot Transfers;Sit to/from Stand Sit to Stand: Mod assist Stand pivot transfers: Min assist       General transfer comment: min assist fo4r safety, pt at times impulsive    Balance Overall balance assessment: Needs assistance;History of Falls Sitting-balance support: No upper extremity supported;Feet supported Sitting balance-Leahy Scale: Fair                                     ADL either performed or assessed  with clinical judgement   ADL Overall ADL's : Needs assistance/impaired Eating/Feeding: Modified independent   Grooming: Set up;Sitting   Upper Body Bathing: Set up;Sitting   Lower Body Bathing: Sit to/from stand;Maximal assistance   Upper Body Dressing : Set up;Sitting   Lower Body Dressing: Sit to/from stand;Maximal assistance   Toilet Transfer: RW;Ambulation;Maximal assistance;+2 for safety/equipment;+2 for physical assistance   Toileting- Clothing Manipulation and Hygiene: Maximal assistance;Sit to/from stand;Cueing for safety;Cueing for sequencing       Functional mobility during ADLs: Moderate assistance;Rolling walker;Cueing for safety(+2 for safety) General ADL Comments: May benefit from AE     Vision Baseline Vision/History: Wears glasses              Pertinent Vitals/Pain Pain Assessment: 0-10 Pain Score: 6  Faces Pain Scale: Hurts even more Pain Location: neck, R shoulder, R hip, R thigh, R knee, L groin and his back Pain Descriptors / Indicators: Aching;Discomfort;Grimacing Pain Intervention(s): Monitored during session;Limited activity within patient's tolerance;Repositioned     Hand Dominance     Extremity/Trunk Assessment Upper Extremity Assessment RUE Deficits / Details: reports R shoulder pain lately after fall about a week ago, also hx of cervical neck surgery   Lower Extremity Assessment RLE Deficits / Details: muscle spasms present with any MMT of R LE in sitting, pt unable to maintain resistance RLE Sensation: decreased light touch LLE Deficits / Details: grossly at least 3+/5 LLE Sensation: decreased light touch       Communication Communication Communication: No difficulties   Cognition  Arousal/Alertness: Awake/alert Behavior During Therapy: WFL for tasks assessed/performed Overall Cognitive Status: Within Functional Limits for tasks assessed                                 General Comments: very talkative    General  Comments       Exercises     Shoulder Instructions      Home Living Family/patient expects to be discharged to:: Skilled nursing facility   Available Help at Discharge: Available PRN/intermittently;Friend(s) Type of Home: House Home Access: Ramped entrance     Home Layout: One level               Home Equipment: Walker - 4 wheels;Cane - quad          Prior Functioning/Environment Level of Independence: Needs assistance  Gait / Transfers Assistance Needed: Uses rollator for household ambulation or cane. Stays in room most of the time, limited mobility. pt reports multiple falls in the past month              OT Problem List: Decreased strength;Decreased range of motion;Decreased activity tolerance;Impaired balance (sitting and/or standing);Decreased safety awareness;Decreased knowledge of use of DME or AE;Cardiopulmonary status limiting activity;Impaired sensation;Pain      OT Treatment/Interventions: Self-care/ADL training;Therapeutic exercise;Energy conservation;DME and/or AE instruction;Therapeutic activities;Patient/family education;Balance training    OT Goals(Current goals can be found in the care plan section) Acute Rehab OT Goals Patient Stated Goal: to get rid of pain OT Goal Formulation: With patient Time For Goal Achievement: 06/20/18  OT Frequency: Min 2X/week   Barriers to D/C:               AM-PAC PT "6 Clicks" Daily Activity     Outcome Measure Help from another person eating meals?: None Help from another person taking care of personal grooming?: None Help from another person toileting, which includes using toliet, bedpan, or urinal?: A Lot Help from another person bathing (including washing, rinsing, drying)?: A Little Help from another person to put on and taking off regular upper body clothing?: A Little Help from another person to put on and taking off regular lower body clothing?: A Lot 6 Click Score: 18   End of Session Equipment  Utilized During Treatment: Gait belt;Rolling walker;Oxygen(2L) Nurse Communication: Mobility status  Activity Tolerance: Patient tolerated treatment well Patient left: in chair;with call bell/phone within reach  OT Visit Diagnosis: Unsteadiness on feet (R26.81);Other abnormalities of gait and mobility (R26.89);Muscle weakness (generalized) (M62.81);History of falling (Z91.81);Pain Pain - part of body: Shoulder;Arm;Hip;Knee;Leg(neck back)                Time: 7494-4967 OT Time Calculation (min): 15 min Charges:  OT General Charges $OT Visit: 1 Visit OT Evaluation $OT Eval Moderate Complexity: [place]  Betsy Pries 06/06/2018, 5:08 PM

## 2018-06-06 NOTE — Progress Notes (Signed)
Physical Therapy Treatment Patient Details Name: Robert [name] MRN: [id number] DOB: 11/25/1960 Today's Date: 06/06/2018    History of Present Illness 57 y.o. male with PMHx significant for Hypothyroidism, Hep C, cirrhosis, Hypertension, CVA, h/o PE/ DVT (recurrent), Asthma/ Copd on home O2, chronic neck and back pain, just discharged from [facility] for symptomatic anemia from acute GI bleed due to NSAID use causing PUD (duodenal) and admitted 05/31/18 for acute on chronic respiratory failure with hypoxia due to HCAP    PT Comments    Pt required MAX encouragement to participate.  Pt repeated "I can't walk cause of my back .  However pt inconsistent with his story about his limitations due to back however during session, pt bent down a few time to adjust wheelchair pedals with no c/o's, pt was twist trunk during transfers with no c/o's and pt was getting OOB on his own with no pain c/o's.  Assisted OOB to wheelchair.  Pt self performed TTWB R LE and noted decreased functional use/support R UE (bad shoulder). Pt continued to decline gait.  Rolled in wheelchair, static standing at window at end of hallway then back to his room and assisted to recliner.  Multiple pillows for comfort.     Follow Up Recommendations  SNF     Equipment Recommendations  None recommended by PT    Recommendations for Other Services       Precautions / Restrictions Precautions Precautions: Fall Precaution Comments: home O2 Restrictions Weight Bearing Restrictions: No    Mobility  Bed Mobility Overal bed mobility: Needs Assistance Bed Mobility: Supine to Sit;Sit to Supine     Supine to sit: Supervision Sit to supine: Supervision   General bed mobility comments: Increased time to get to EOB. Use of rail. Guarded movement.   Transfers Overall transfer level: Needs assistance Equipment used: None;Standard walker Transfers: Stand Pivot Transfers;Sit to/from Stand Sit to Stand: Min assist Stand pivot  transfers: Min assist       General transfer comment: min assist fo4r safety, pt at times impulsive  Ambulation/Gait Ambulation/Gait assistance: Min assist Gait Distance (Feet): 1 Feet Assistive device: None       General Gait Details: self limiting, declined to try.  Noted self TTWB R LE due to hip pain.  Static standing at hallway window x 6 min self NWB R LE with heavy lean on window sile.   Stairs             Information systems manager mobility: Yes Wheelchair propulsion: Left upper extremity Wheelchair parts: Needs assistance Distance: unable to use R UE die to "bad shoulder" plus minimal use of B LE's  Wheelchair Assistance Details (indicate cue type and reason): limited  Modified Rankin (Stroke Patients Only)       Balance                                            Cognition Arousal/Alertness: Awake/alert Behavior During Therapy: WFL for tasks assessed/performed Overall Cognitive Status: Within Functional Limits for tasks assessed                                 General Comments: very talkative and fast Counrty like at time difficult to understand      Exercises      General Comments  Pertinent Vitals/Pain Pain Assessment: Faces Faces Pain Scale: Hurts even more Pain Location: neck, R shoulder, R hip, R thigh, R knee, L groin and his back Pain Descriptors / Indicators: Aching;Discomfort;Grimacing Pain Intervention(s): Monitored during session    Home Living                      Prior Function            PT Goals (current goals can now be found in the care plan section) Progress towards PT goals: Progressing toward goals    Frequency    Min 2X/week      PT Plan Current plan remains appropriate    Co-evaluation              AM-PAC PT "6 Clicks" Daily Activity  Outcome Measure  Difficulty turning over in bed (including adjusting bedclothes, sheets and  blankets)?: A Little Difficulty moving from lying on back to sitting on the side of the bed? : A Little Difficulty sitting down on and standing up from a chair with arms (e.g., wheelchair, bedside commode, etc,.)?: A Little Help needed moving to and from a bed to chair (including a wheelchair)?: A Little Help needed walking in hospital room?: A Little Help needed climbing 3-5 steps with a railing? : A Lot 6 Click Score: 17    End of Session Equipment Utilized During Treatment: Oxygen Activity Tolerance: Other (comment) Patient left: in chair;with call bell/phone within reach Nurse Communication: Mobility status PT Visit Diagnosis: Difficulty in walking, not elsewhere classified (R26.2);History of falling (Z91.81)     Time: 3887-1959 PT Time Calculation (min) (ACUTE ONLY): 26 min  Charges:  $Therapeutic Activity: 8-22 mins $Wheel Chair Management: 8-22 mins                     Rica Koyanagi  PTA [facility]  Acute  Rehab Pager      [phone]

## 2018-06-06 NOTE — Progress Notes (Signed)
PROGRESS NOTE    Robert [name]  EPP:[id number] DOB: Nov 11, 1960 DOA: 05/31/2018 PCP: Patient, No Pcp Per    Brief Narrative:  Robert [name] is an 57 y.o. male past medical history of hypothyroidism, hepatitis C with cirrhosis history of PE and DVT  and COPD on home O2 discharged for symptomatic anemia and upper GI bleed due to NSAIDs causing a duodenal ulcer apparently was discharged home and was desaturating with ambulation came into the ED was found to have a fever hypoxic with a white count of 15, chest x-ray showed patchy consolidation in the right mid and lower lobe   Assessment & Plan:   Principal Problem:   HCAP (healthcare-associated pneumonia) Active Problems:   Hypotension   Normocytic anemia   Sepsis ([place])   Tachycardia   Fever   Acute on chronic respiratory failure with hypoxia (HCC)   Orthostasis   Multifocal pneumonia   Acute urinary retention  1 acute on chronic respiratory failure with hypoxia secondary to multilobar pneumonia/HCAP/sepsis Patient admitted with hypoxia, fever, leukocytosis, chest x-ray consistent with multifocal pneumonia.  Blood cultures negative to date.  MRSA PCR negative.  Sputum Gram stain and cultures pending.  Fever curve trending down.  Leukocytosis trended down.  Patient was on IV vancomycin IV cefepime and has been transitioned to oral Levaquin.  Increased Levaquin dose to 750 mg daily on 06/04/2018.Marland Kitchen  Continue albuterol nebs.  Continue Spiriva.  2.  Orthostatic hypotension Patient noted to have significant orthostasis the morning of 06/04/2018 with significant symptoms of dizziness and lightheadedness.  Patient still orthostatic today 06/05/2018 however symptoms slowly improving with still some dizziness and some lightheadedness from supine to sitting position.  Flomax has been discontinued.  Hemoglobin stable.  Patient on Levaquin for pneumonia and afebrile.  Continue IV fluids, TED hose.  Follow.  Repeat orthostatics in the morning.   Follow.  3.  Peptic ulcer disease/duodenal ulcer secondary to NSAID use Patient with no overt bleeding.  Hemoglobin 10.1.  Continue PPI twice daily.  Follow.  4.  Anemia H&H stable.  Follow.    5.  Chronic pain Patient states has a history of chronic pain secondary to cervical disc disease and chronic low back pain.  Patient states was on oxycodone 10 mg as needed however was on 5 mg as needed.  Continue oxycodone 10 mg as needed for pain.  Continue Lidoderm patch.  Continue Cymbalta.  Continue Neurontin.  Outpatient follow-up. Follow.  6.  History of urinary retention Patient noted to be on Flomax twice daily during this hospitalization.  Patient with good urine output however concerned that patient was not emptying out his bladder well.  Patient also noted to have significant orthostatic hypotension as such Flomax has been discontinued.  Postvoid residual approximately 400 cc per RN.  Due to symptomatic orthostatic hypotension Flomax has been discontinued.  I and O cath as needed.patient noted to void 375 cc of clear yellow odorless urine early this morning with a postvoid residual of 441 cc.  Patient with normal renal function.  No signs of hydronephrosis.  Continue I andO cath as needed.  Will need outpatient follow-up with urology.  Case discussed with urology over the telephone.    DVT prophylaxis: SCDs Code Status: Full Family Communication: Updated patient and family at bedside. Disposition Plan: Likely skilled nursing facility versus home with home health once orthostasis has resolved and patient asymptomatic..   Consultants:   None  Procedures:   Chest x-ray 05/31/2018    Antimicrobials:  IV cefepime 05/31/2018>>>> 06/02/2018  IV vancomycin 05/31/2018>>>> 06/01/2018  Levaquin 06/02/2018   Subjective: Patient with complaints of chronic back and neck pain.  Patient still orthostatic however symptoms slowly improving.  No shortness of breath.  No chest pain.  No overt  bleeding.    Objective: Vitals:   06/05/18 2049 06/05/18 2117 06/06/18 0425 06/06/18 0759  BP:  127/84 120/79   Pulse:  (!) 102 92   Resp:  20 19   Temp:  97.6 F (36.4 C) 98.3 F (36.8 C)   TempSrc:      SpO2: 96% 99% (!) 85% 95%  Weight:      Height:        Intake/Output Summary (Last 24 hours) at 06/06/2018 1312 Last data filed at 06/06/2018 1000 Gross per 24 hour  Intake 3406.9 ml  Output 1300 ml  Net 2106.9 ml   Filed Weights   05/31/18 1805 05/31/18 2300  Weight: 68 kg 68 kg    Examination:  General exam: NAD Respiratory system: Lungs clear to auscultation bilaterally.  No wheezes, no crackles, no rhonchi.  Normal respiratory effort.   Cardiovascular system: RRR no murmurs rubs or gallops.  No JVD.  No lower extremity edema.  Regular rate and rhythm no murmurs rubs or gallops.  No JVD.  No lower extremity edema. Gastrointestinal system: Abdomen is soft, nontender, nondistended, positive bowel sounds.  No rebound.  No guarding.   Central nervous system: Alert and oriented. No focal neurological deficits. Extremities: Symmetric 5 x 5 power. Skin: No rashes, lesions or ulcers Psychiatry: Judgement and insight appear normal. Mood & affect appropriate.     Data Reviewed: I have personally reviewed following labs and imaging studies  CBC: Recent Labs  Lab 05/31/18 1834 06/01/18 0116 06/01/18 0713 06/02/18 0346 06/04/18 0910 06/05/18 0636  WBC 15.6* 16.1*  --  11.6* 6.0 7.3  NEUTROABS 12.4*  --   --   --  3.4 4.4  HGB 10.2* 8.8* 8.7* 8.5* 9.3* 10.1*  HCT 30.2* 27.1* 26.5* 25.8* 28.0* 30.4*  MCV 88.3 89.4  --  89.3 87.5 87.6  PLT 490* 423*  --  379 483* 174*   Basic Metabolic Panel: Recent Labs  Lab 05/31/18 1834 06/01/18 0425 06/04/18 0627 06/05/18 0636 06/06/18 0602  NA 136 143 141 142 141  K 4.5 3.6 4.0 3.6 4.0  CL 100 111 109 105 106  CO2 25 25 25 28 26   GLUCOSE 94 114* 88 93 89  BUN 16 13 9 9 10   CREATININE 1.08 0.94 0.88 0.83 0.87  CALCIUM  8.5* 8.2* 8.8* 8.9 8.5*   GFR: Estimated Creatinine Clearance: 87.6 mL/min (by C-G formula based on SCr of 0.87 mg/dL). Liver Function Tests: Recent Labs  Lab 05/31/18 1834 06/01/18 0425  AST 39 24  ALT 26 20  ALKPHOS 53 51  BILITOT 1.0 0.4  PROT 6.0* 5.1*  ALBUMIN 3.2* 2.7*   No results for input(s): LIPASE, AMYLASE in the last 168 hours. No results for input(s): AMMONIA in the last 168 hours. Coagulation Profile: No results for input(s): INR, PROTIME in the last 168 hours. Cardiac Enzymes: No results for input(s): CKTOTAL, CKMB, CKMBINDEX, TROPONINI in the last 168 hours. BNP (last 3 results) No results for input(s): PROBNP in the last 8760 hours. HbA1C: No results for input(s): HGBA1C in the last 72 hours. CBG: No results for input(s): GLUCAP in the last 168 hours. Lipid Profile: No results for input(s): CHOL, HDL, LDLCALC, TRIG, CHOLHDL, LDLDIRECT in the  last 72 hours. Thyroid Function Tests: No results for input(s): TSH, T4TOTAL, FREET4, T3FREE, THYROIDAB in the last 72 hours. Anemia Panel: No results for input(s): VITAMINB12, FOLATE, FERRITIN, TIBC, IRON, RETICCTPCT in the last 72 hours. Sepsis Labs: Recent Labs  Lab 05/31/18 1900  LATICACIDVEN 1.67    Recent Results (from the past 240 hour(s))  MRSA PCR Screening     Status: None   Collection Time: 05/28/18  6:00 PM  Result Value Ref Range Status   MRSA by PCR NEGATIVE NEGATIVE Final    Comment:        The GeneXpert MRSA Assay (FDA approved for NASAL specimens only), is one component of a comprehensive MRSA colonization surveillance program. It is not intended to diagnose MRSA infection nor to guide or monitor treatment for MRSA infections. Performed at [facility], [place] [address] [place], [place] [address]   Culture, blood (routine x 2)     Status: None   Collection Time: 05/31/18  6:34 PM  Result Value Ref Range Status   Specimen Description   Final    BLOOD BLOOD LEFT ARM Performed at  [facility] [address], [place], [place] [address]    Special Requests   Final    BOTTLES DRAWN AEROBIC AND ANAEROBIC Blood Culture adequate volume Performed at [facility] [address], [place], [place] [address]    Culture   Final    NO GROWTH 5 DAYS Performed at [facility], [place] [address] [place], [place] [address]    Report Status 06/05/2018 FINAL  Final  Culture, blood (routine x 2)     Status: None   Collection Time: 05/31/18  6:39 PM  Result Value Ref Range Status   Specimen Description   Final    BLOOD BLOOD RIGHT HAND Performed at [facility] [address] [place], [place] [address]    Special Requests   Final    BOTTLES DRAWN AEROBIC AND ANAEROBIC Blood Culture adequate volume Performed at [facility] [address] [place], [place] [address]    Culture   Final    NO GROWTH 5 DAYS Performed at [facility], [place] [address], [place], [place] [address]    Report Status 06/05/2018 FINAL  Final  Urine culture     Status: None   Collection Time: 05/31/18  7:51 PM  Result Value Ref Range Status   Specimen Description   Final    URINE, RANDOM Performed at [facility] [address], [place], [place] [address]    Special Requests   Final    NONE Performed at [facility], [place] [address], [place], [place] [address]    Culture   Final    NO GROWTH Performed at [facility], [place] [address], [place], [place] [address]    Report Status 06/02/2018 FINAL  Final  MRSA PCR Screening     Status: None   Collection Time: 06/01/18  1:59 AM  Result Value Ref Range Status   MRSA by PCR NEGATIVE NEGATIVE Final    Comment:        The GeneXpert MRSA Assay (FDA approved for NASAL specimens only), is one component of a comprehensive MRSA colonization surveillance program. It is not intended to diagnose MRSA infection nor to guide  or monitor treatment for MRSA infections. Performed at [facility], [place] [address] [place],  [address]  Radiology Studies: No results found.      Scheduled Meds: . albuterol  2.5 mg Nebulization TID  . DULoxetine  60 mg Oral BID  . ferrous sulfate  325 mg Oral QHS  . gabapentin  400 mg Oral TID  . levofloxacin  750 mg Oral Daily  . levothyroxine  50 mcg Oral QAC breakfast  . lidocaine  1 patch Transdermal Q24H  . nicotine  14 mg Transdermal Daily  . pantoprazole  40 mg Oral BID  . polyethylene glycol  17 g Oral BID  . senna-docusate  1 tablet Oral BID  . tiotropium  18 mcg Inhalation Daily   Continuous Infusions: . sodium chloride 100 mL/hr at 06/06/18 0325     LOS: 6 days    Time spent: 35 mins    Irine Seal, MD [facility] Pager [phone]  If 7PM-7AM, please contact night-coverage [email or web address] Password [email or web address] 06/06/2018, 1:12 PM

## 2018-06-07 DIAGNOSIS — A419 Sepsis, unspecified organism: Secondary | ICD-10-CM

## 2018-06-07 LAB — CBC
HCT: 27.5 % — ABNORMAL LOW (ref 39.0–52.0)
HEMOGLOBIN: 9 g/dL — AB (ref 13.0–17.0)
MCH: 28.8 pg (ref 26.0–34.0)
MCHC: 32.7 g/dL (ref 30.0–36.0)
MCV: 87.9 fL (ref 78.0–100.0)
Platelets: 458 10*3/uL — ABNORMAL HIGH (ref 150–400)
RBC: 3.13 MIL/uL — AB (ref 4.22–5.81)
RDW: 15.9 % — ABNORMAL HIGH (ref 11.5–15.5)
WBC: 7 10*3/uL (ref 4.0–10.5)

## 2018-06-07 LAB — BASIC METABOLIC PANEL
Anion gap: 8 (ref 5–15)
BUN: 11 mg/dL (ref 6–20)
CO2: 27 mmol/L (ref 22–32)
CREATININE: 0.96 mg/dL (ref 0.61–1.24)
Calcium: 8.5 mg/dL — ABNORMAL LOW (ref 8.9–10.3)
Chloride: 106 mmol/L (ref 98–111)
GFR calc Af Amer: >60 mL/min (ref >60)
GFR calc non Af Amer: >60 mL/min (ref >60)
Glucose, Bld: 94 mg/dL (ref 70–99)
POTASSIUM: 4 mmol/L (ref 3.5–5.1)
SODIUM: 141 mmol/L (ref 135–145)

## 2018-06-07 MED ORDER — LIDOCAINE 5 % EX PTCH: 1.0000 | MEDICATED_PATCH | CUTANEOUS | 0 refills | Status: AC

## 2018-06-07 MED ORDER — SENNOSIDES-DOCUSATE SODIUM 8.6-50 MG PO TABS: 1.0000 | ORAL_TABLET | Freq: Two times a day (BID) | ORAL | Status: AC

## 2018-06-07 MED ORDER — POLYETHYLENE GLYCOL 3350 17 G PO PACK: 17.0000 g | PACK | Freq: Two times a day (BID) | ORAL | 0 refills | Status: AC

## 2018-06-07 MED ORDER — HYDROXYZINE HCL 25 MG PO TABS: 25.0000 mg | ORAL_TABLET | Freq: Three times a day (TID) | ORAL | 0 refills | Status: DC | PRN

## 2018-06-07 MED ORDER — NICOTINE 14 MG/24HR TD PT24: 14.0000 mg | MEDICATED_PATCH | Freq: Every day | TRANSDERMAL | 0 refills | Status: AC

## 2018-06-07 MED ORDER — OXYCODONE HCL 10 MG PO TABS: 10.0000 mg | ORAL_TABLET | ORAL | 0 refills | Status: DC | PRN

## 2018-06-07 NOTE — Progress Notes (Signed)
Report called to [facility] RN at [facility]. Awaiting transportation.

## 2018-06-07 NOTE — Progress Notes (Signed)
Pt. discharged to [facility]. Left with  [facility] via stretcher, no respiratory distress noted. O2 at 3L/[place].

## 2018-06-07 NOTE — Progress Notes (Signed)
Patient discharging to [facility] room 227B.  [facility] has been called for transportation.       Pricilla Holm, MSW, [facility] Social Work [phone]

## 2018-06-07 NOTE — Clinical Social Work Placement (Signed)
Patient discharging to [facility].  CSW confirmed bed at facility and faxed appropriate documents.  Patient will transport by [facility].  Patient will be going to room 227B.   RN call report to [phone]  CLINICAL SOCIAL WORK PLACEMENT  NOTE  Date:  06/07/2018  Patient Details  Name: Ramondo Dietze MRN: [id number] Date of Birth: 21-Aug-1961  Clinical Social Work is seeking post-discharge placement for this patient at the [place] level of care (*CSW will initial, date and re-position this form in  chart as items are completed):  Yes   Patient/family provided with [facility] Work Department's list of facilities offering this level of care within the geographic area requested by the patient (or if unable, by the patient's family).  Yes   Patient/family informed of their freedom to choose among providers that offer the needed level of care, that participate in Medicare, Medicaid or managed care program needed by the patient, have an available bed and are willing to accept the patient.  Yes   Patient/family informed of [facility]'s ownership interest in [facility] and [facility], as well as of the fact that they are under no obligation to receive care at these facilities.  PASRR submitted to EDS on       PASRR number received on       Existing PASRR number confirmed on       FL2 transmitted to all facilities in geographic area requested by pt/family on       FL2 transmitted to all facilities within larger geographic area on       Patient informed that his/her managed care company has contracts with or will negotiate with certain facilities, including the following:        Yes   Patient/family informed of bed offers received.  Patient chooses bed at [facility])     Physician recommends and patient chooses bed at [facility])    Patient to be transferred to [facility]) on 06/07/18.  Patient to be transferred to  facility by [facility])     Patient family notified on 06/07/18 of transfer.  Name of family member notified:  Patient     PHYSICIAN       Additional Comment:    _______________________________________________ Pricilla Holm, LCSW 06/07/2018, 3:12 PM

## 2018-06-07 NOTE — Discharge Summary (Signed)
Physician Discharge Summary  Robert [name] BPZ:[id number] DOB: 09-Jul-1961 DOA: 05/31/2018  PCP: Patient, No Pcp Per  Admit date: 05/31/2018 Discharge date: 06/07/2018  Time spent: 60 minutes  Recommendations for Outpatient Follow-up:  1. Follow-up with MD at skilled nursing facility. 2. Follow-up with orthopedics as scheduled. 3. Follow-up with urology as needed for urinary retention.  Patient may I and O cath as needed.   Discharge Diagnoses:  Principal Problem:   HCAP (healthcare-associated pneumonia) Active Problems:   Hypotension   Normocytic anemia   Sepsis ([place])   Tachycardia   Fever   Acute on chronic respiratory failure with hypoxia (HCC)   Orthostasis   Multifocal pneumonia   Acute urinary retention   Discharge Condition: Stable and improved.  Diet recommendation: Heart healthy  Filed Weights   05/31/18 1805 05/31/18 2300  Weight: 68 kg 68 kg    History of present illness:  Per Dr.Kim Caio Devera  is a 57 y.o. male, w Hypothyroidism, Hep C cirrhosis Hypertension, CVA, h/o PE/ DVT (recurrent), Asthma/ Copd on home o2, just discharged from [facility] for symptomatic anemia from acute GI bleed due to NSAID use causing PUD (duodenal).  Pt apparently was discharged on home o2 due to desaturation with ambulation on day of admission.  Pt presented  with fever from SNF as well as cough.   In ED,  T 102 P 104, Bp 85/53  Pox 97%  On o2  Wbc 15.6, hgb 10.2, Plt 490,  Na 136, K 4.5, Bun 16, Creatinine 1.08 Alb 3.2 Ast 39, Alt 26, Alk phos 53, T. Bili 1.0  Lactic acid 1.67 Urinalysis negative  Blood culture x2 pending  CXR  IMPRESSION: Interval development of patchy consolidative opacities within the right mid and lower lung and left lung base concerning for pneumonia in the appropriate clinical setting. Followup PA and lateral chest  X-ray is recommended in 3-4 weeks following trial of antibiotic therapy to ensure resolution and exclude underlying  malignancy.  Pt admitted for sepsis (fever, tachycardia, leukocytosis and hypotension), secondary to Hcap, and anemia (stable)   Hospital Course:  1 acute on chronic respiratory failure with hypoxia secondary to multilobar pneumonia/HCAP/sepsis Patient admitted with hypoxia, fever, leukocytosis, chest x-ray consistent with multifocal pneumonia.  Blood cultures negative to date.  MRSA PCR negative.  Sputum Gram stain and cultures were done with no growth to date.  Patient's fever curve trended down.  MRSA PCR was negative.  Blood cultures obtained were negative.  Patient was initially on IV vancomycin IV cefepime and subsequently transition to oral Levaquin.  Patient received a full course of antibiotic treatment by day of discharge and hypoxia had improved and patient was back on his baseline home O2 at 3 L nasal cannula sats of 99%.  Patient was also maintained on Spiriva as well as albuterol nebs.  Patient be discharged in stable and improved condition.  No further antibiotics needed.  2.  Orthostatic hypotension Patient noted to have significant orthostasis the morning of 06/04/2018 with significant symptoms of dizziness and lightheadedness.    It was felt patient's orthostasis was likely secondary to ongoing Flomax use as well as dehydration.  Flomax was subsequently discontinued.  Hemoglobin was checked which remained stabilized.  Patient remained afebrile.  Patient was on antibiotic treatment for pneumonia and remained afebrile.  Patient was placed on IV fluids and TED hose applied.  Patient improved clinically slowly on a daily basis.  Patient symptoms improved and was asymptomatic by day of discharge.  Patient's  Flomax has been subsequently discontinued.  Outpatient follow-up.    3.  Peptic ulcer disease/duodenal ulcer secondary to NSAID use Patient with no overt bleeding during this hospitalization.  Hemoglobin stabilized at 10.1.  Patient was maintained on PPI twice daily.  Outpatient  follow-up.  4.  Anemia H&H stable.     5.  Chronic pain Patient stated has a history of chronic pain secondary to cervical disc disease and chronic low back pain.  Patient stated was on oxycodone 10 mg as needed however was on 5 mg as needed during the hospitalization and in significant pain.  Patient's oxycodone was increased back to his home regimen of 10 mg as needed for pain.  Lidoderm patch was attached.  Patient maintained on Cymbalta and Neurontin as well.  Patient stated had a outpatient follow-up with orthopedics early next week for further evaluation and management of his low back pain and cervical disc disease.  Patient was discharged to a skilled nursing facility.  Outpatient follow-up with orthopedics as scheduled.   6.  History of urinary retention Patient noted to be on Flomax twice daily prior and initially during this hospitalization.  Patient with good urine output however concerned that patient was not emptying out his bladder well.  Patient also noted to have significant orthostatic hypotension, and as such Flomax discontinued.  Postvoid residual approximately 400 cc per RN.  Due to symptomatic orthostatic hypotension Flomax discontinued.  I and O cath as needed.patient noted to void 375 cc of clear yellow odorless urine with a postvoid residual of 441 cc.  Patient with normal renal function.  No signs of hydronephrosis.    Patient maintained on I and O cath as needed.  Case was discussed with urology over the phone.  It was recommended that patient follow-up with urology in the outpatient setting however patient stated had multiple appointments and as such wanted to hold off on outpatient follow-up with urology.      Procedures:  Chest x-ray 05/31/2018  Consultations:  None  Discharge Exam: Vitals:   06/07/18 1225 06/07/18 1228  BP: 121/87 95/79  Pulse: 99 (!) 112  Resp: 19 20  Temp:    SpO2: 98% 99%    General: NAD Cardiovascular: RRR Respiratory:  CTAB  Discharge Instructions   Discharge Instructions    Diet general   Complete by:  As directed    Increase activity slowly   Complete by:  As directed      Allergies as of 06/07/2018      Reactions   Ciprofloxacin Hives   Penicillins Itching   Has patient had a PCN reaction causing immediate rash, facial/tongue/throat swelling, SOB or lightheadedness with hypotension: YES Has patient had a PCN reaction causing severe rash involving mucus membranes or skin necrosis: NO Has patient had a PCN reaction that required hospitalization NO Has patient had a PCN reaction occurring within the last 10 years: NO If all of the above answers are "NO", then may proceed with Cephalosporin use.   Adhesive  [tape] Rash   Morphine And Related Itching, Rash      Medication List    STOP taking these medications   cyclobenzaprine 10 MG tablet Commonly known as:  FLEXERIL   oxyCODONE-acetaminophen 5-325 MG tablet Commonly known as:  PERCOCET/ROXICET   tamsulosin 0.4 MG Caps capsule Commonly known as:  FLOMAX     TAKE these medications   albuterol 108 (90 Base) MCG/ACT inhaler Commonly known as:  PROVENTIL HFA;VENTOLIN HFA Inhale 1-2 puffs into  the lungs every 6 (six) hours as needed for wheezing or shortness of breath. What changed:  Another medication with the same name was removed. Continue taking this medication, and follow the directions you see here.   COMBIVENT RESPIMAT 20-100 MCG/ACT Aers respimat Generic drug:  Ipratropium-Albuterol Inhale 1 puff into the lungs every 6 (six) hours.   diclofenac sodium 1 % Gel Commonly known as:  VOLTAREN Apply 2 g topically 4 (four) times daily.   DULoxetine 60 MG capsule Commonly known as:  CYMBALTA Take 1 capsule (60 mg total) by mouth 2 (two) times daily.   FERROUS SULFATE IRON 200 (65 Fe) MG Tabs Generic drug:  Ferrous Sulfate Dried Take 1 tablet by mouth at bedtime.   gabapentin 300 MG capsule Commonly known as:  NEURONTIN Take 600  mg by mouth 3 (three) times daily. What changed:  Another medication with the same name was removed. Continue taking this medication, and follow the directions you see here.   hydrOXYzine 25 MG tablet Commonly known as:  ATARAX/VISTARIL Take 1 tablet (25 mg total) by mouth 3 (three) times daily as needed for anxiety.   levothyroxine 50 MCG tablet Commonly known as:  SYNTHROID, LEVOTHROID Take 1 tablet (50 mcg total) by mouth daily before breakfast.   lidocaine 5 % Commonly known as:  LIDODERM Place 1 patch onto the skin daily. Remove & Discard patch within 12 hours or as directed by MD   methocarbamol 750 MG tablet Commonly known as:  ROBAXIN Take 750 mg by mouth 3 (three) times daily as needed for pain.   nicotine 14 mg/24hr patch Commonly known as:  NICODERM CQ - dosed in mg/24 hours Place 1 patch (14 mg total) onto the skin daily. Start taking on:  06/08/2018   Oxycodone HCl 10 MG Tabs Take 1 tablet (10 mg total) by mouth every 4 (four) hours as needed for severe pain. What changed:    medication strength  how much to take  when to take this   pantoprazole 40 MG tablet Commonly known as:  PROTONIX Take 1 tablet (40 mg total) by mouth 2 (two) times daily.   polyethylene glycol packet Commonly known as:  MIRALAX / GLYCOLAX Take 17 g by mouth 2 (two) times daily.   senna-docusate 8.6-50 MG tablet Commonly known as:  Senokot-S Take 1 tablet by mouth 2 (two) times daily.   tiotropium 18 MCG inhalation capsule Commonly known as:  SPIRIVA Place 18 mcg into inhaler and inhale daily.   vitamin B-12 50 MCG tablet Commonly known as:  CYANOCOBALAMIN Take 200 mcg by mouth daily.            Durable Medical Equipment  (From admission, onward)         Start     Ordered   06/07/18 0832  For home use only DME 3 n 1  Once     06/07/18 0831         Allergies  Allergen Reactions  . Ciprofloxacin Hives  . Penicillins Itching    Has patient had a PCN reaction  causing immediate rash, facial/tongue/throat swelling, SOB or lightheadedness with hypotension: YES Has patient had a PCN reaction causing severe rash involving mucus membranes or skin necrosis: NO Has patient had a PCN reaction that required hospitalization NO Has patient had a PCN reaction occurring within the last 10 years: NO If all of the above answers are "NO", then may proceed with Cephalosporin use.  . Adhesive  [Tape] Rash  .  Morphine And Related Itching and Rash   Follow-up Information    MD Follow up.   Why:  f/u with MD at SNF       orthopedics Follow up.   Why:  f/u with orthpoedics as scheduled.       PCP Follow up.   Why:  f/u as scheduled.           The results of significant diagnostics from this hospitalization (including imaging, microbiology, ancillary and laboratory) are listed below for reference.    Significant Diagnostic Studies: Dg Chest 2 View  Result Date: 05/31/2018 CLINICAL DATA:  Cough.  Low-grade fever. EXAM: CHEST - 2 VIEW COMPARISON:  Chest radiograph 05/28/2018 FINDINGS: Monitoring leads overlie the patient. Stable cardiac and mediastinal contours. Interval development of patchy consolidative opacities within the right mid and lower lung and left lung base. No pleural effusion or pneumothorax. Thoracic spine degenerative changes. IMPRESSION: Interval development of patchy consolidative opacities within the right mid and lower lung and left lung base concerning for pneumonia in the appropriate clinical setting. Followup PA and lateral chest X-ray is recommended in 3-4 weeks following trial of antibiotic therapy to ensure resolution and exclude underlying malignancy. Electronically Signed   By: Lovey Newcomer M.D.   On: 05/31/2018 19:51   Dg Chest 2 View  Result Date: 05/28/2018 CLINICAL DATA:  Chest pain and syncopal episode EXAM: CHEST - 2 VIEW COMPARISON:  01/07/2017 FINDINGS: Cardiac shadow is stable. Lungs are mildly hyperinflated bilaterally  without focal infiltrate or sizable effusion. No acute bony abnormality is seen. IMPRESSION: Mild COPD without acute abnormality. Electronically Signed   By: Inez Catalina M.D.   On: 05/28/2018 13:19   US Abdomen Limited Ruq  Result Date: 05/28/2018 CLINICAL DATA:  Hepatitis-C.  Question cirrhosis. EXAM: ULTRASOUND ABDOMEN LIMITED RIGHT UPPER QUADRANT COMPARISON:  04/18/2012 CT FINDINGS: Gallbladder: No gallstones or wall thickening visualized. No sonographic Murphy sign noted by sonographer. Common bile duct: Diameter: Normal caliber, 4 mm Liver: No focal lesion identified. Within normal limits in parenchymal echogenicity. Portal vein is patent on color Doppler imaging with normal direction of blood flow towards the liver. IMPRESSION: Unremarkable right upper quadrant ultrasound. Electronically Signed   By: Rolm Baptise M.D.   On: 05/28/2018 21:39    Microbiology: Recent Results (from the past 240 hour(s))  MRSA PCR Screening     Status: None   Collection Time: 05/28/18  6:00 PM  Result Value Ref Range Status   MRSA by PCR NEGATIVE NEGATIVE Final    Comment:        The GeneXpert MRSA Assay (FDA approved for NASAL specimens only), is one component of a comprehensive MRSA colonization surveillance program. It is not intended to diagnose MRSA infection nor to guide or monitor treatment for MRSA infections. Performed at [facility], [place] [address] [place], [place] [address]   Culture, blood (routine x 2)     Status: None   Collection Time: 05/31/18  6:34 PM  Result Value Ref Range Status   Specimen Description   Final    BLOOD BLOOD LEFT ARM Performed at [facility] [address], [place], [place] [address]    Special Requests   Final    BOTTLES DRAWN AEROBIC AND ANAEROBIC Blood Culture adequate volume Performed at [facility] [address], [place], [place] [address]    Culture   Final    NO GROWTH 5 DAYS Performed at [facility], [address] [place], [place] [address]    Report Status 06/05/2018 FINAL  Final  Culture, blood (routine x 2)     Status: None   Collection Time: 05/31/18  6:39 PM  Result Value Ref Range Status   Specimen Description   Final    BLOOD BLOOD RIGHT HAND Performed at [facility] [address] [place], [place] [address]    Special Requests   Final    BOTTLES DRAWN AEROBIC AND ANAEROBIC Blood Culture adequate volume Performed at [facility] [address], [place], [place] [address]    Culture   Final    NO GROWTH 5 DAYS Performed at [facility], [place] [address] [place], [place] [address]    Report Status 06/05/2018 FINAL  Final  Urine culture     Status: None   Collection Time: 05/31/18  7:51 PM  Result Value Ref Range Status   Specimen Description   Final    URINE, RANDOM Performed at [facility] [address] [place], [place] [address]    Special Requests   Final    NONE Performed at [facility], [place] [address], [place], [place] [address]    Culture   Final    NO GROWTH Performed at [facility], [place] [address] [place], [place] [address]    Report Status 06/02/2018 FINAL  Final  MRSA PCR Screening     Status: None   Collection Time: 06/01/18  1:59 AM  Result Value Ref Range Status   MRSA by PCR NEGATIVE NEGATIVE Final    Comment:        The GeneXpert MRSA Assay (FDA approved for NASAL specimens only), is one component of a comprehensive MRSA colonization surveillance program. It is not intended to diagnose MRSA infection nor to guide or monitor treatment for MRSA infections. Performed at [facility], [place] [address] [place],  [address]      Labs: Basic Metabolic Panel: Recent Labs  Lab 06/01/18 0425 06/04/18 3300 06/05/18 0636 06/06/18 0602 06/07/18 0608  NA 143 141 142 141 141  K 3.6 4.0 3.6 4.0 4.0  CL 111 109 105  106 106  CO2 _0 GLUCOSE 114* 88 93 89 94  BUN _1 CREATININE 0.94 0.88 0.83 0.87 0.96  CALCIUM 8.2* 8.8* 8.9 8.5* 8.5*   Liver Function Tests: Recent Labs  Lab 05/31/18 1834 06/01/18 0425  AST 39 24  ALT 26 20  ALKPHOS 53 51  BILITOT 1.0 0.4  PROT 6.0* 5.1*  ALBUMIN 3.2* 2.7*   No results for input(s): LIPASE, AMYLASE in the last 168 hours. No results for input(s): AMMONIA in the last 168 hours. CBC: Recent Labs  Lab 05/31/18 1834 06/01/18 0116 06/01/18 0713 06/02/18 0346 06/04/18 0910 06/05/18 0636 06/07/18 0608  WBC 15.6* 16.1*  --  11.6* 6.0 7.3 7.0  NEUTROABS 12.4*  --   --   --  3.4 4.4  --   HGB 10.2* 8.8* 8.7* 8.5* 9.3* 10.1* 9.0*  HCT 30.2* 27.1* 26.5* 25.8* 28.0* 30.4* 27.5*  MCV 88.3 89.4  --  89.3 87.5 87.6 87.9  PLT 490* 423*  --  379 483* 468* 458*   Cardiac Enzymes: No results for input(s): CKTOTAL, CKMB, CKMBINDEX, TROPONINI in the last 168 hours. BNP: BNP (last 3 results) No results for input(s): BNP in the last 8760 hours.  ProBNP (last 3  results) No results for input(s): PROBNP in the last 8760 hours.  CBG: No results for input(s): GLUCAP in the last 168 hours.     Signed:  Irine Seal MD.  [facility] 06/07/2018, 2:31 PM

## 2018-06-10 ENCOUNTER — Other Ambulatory Visit: Payer: Self-pay

## 2018-06-10 ENCOUNTER — Encounter: Payer: Self-pay | Admitting: Adult Health

## 2018-06-10 ENCOUNTER — Non-Acute Institutional Stay (SKILLED_NURSING_FACILITY): Payer: Medicaid Other | Admitting: Adult Health

## 2018-06-10 DIAGNOSIS — J9621 Acute and chronic respiratory failure with hypoxia: Secondary | ICD-10-CM

## 2018-06-10 DIAGNOSIS — F17200 Nicotine dependence, unspecified, uncomplicated: Secondary | ICD-10-CM | POA: Diagnosis not present

## 2018-06-10 DIAGNOSIS — G8929 Other chronic pain: Secondary | ICD-10-CM

## 2018-06-10 DIAGNOSIS — C3490 Malignant neoplasm of unspecified part of unspecified bronchus or lung: Secondary | ICD-10-CM

## 2018-06-10 DIAGNOSIS — J449 Chronic obstructive pulmonary disease, unspecified: Secondary | ICD-10-CM

## 2018-06-10 DIAGNOSIS — R338 Other retention of urine: Secondary | ICD-10-CM

## 2018-06-10 DIAGNOSIS — D649 Anemia, unspecified: Secondary | ICD-10-CM

## 2018-06-10 DIAGNOSIS — K269 Duodenal ulcer, unspecified as acute or chronic, without hemorrhage or perforation: Secondary | ICD-10-CM

## 2018-06-10 DIAGNOSIS — K5903 Drug induced constipation: Secondary | ICD-10-CM

## 2018-06-10 DIAGNOSIS — M5442 Lumbago with sciatica, left side: Secondary | ICD-10-CM

## 2018-06-10 DIAGNOSIS — E034 Atrophy of thyroid (acquired): Secondary | ICD-10-CM

## 2018-06-10 DIAGNOSIS — M5441 Lumbago with sciatica, right side: Secondary | ICD-10-CM

## 2018-06-10 DIAGNOSIS — I2609 Other pulmonary embolism with acute cor pulmonale: Secondary | ICD-10-CM

## 2018-06-10 DIAGNOSIS — T402X5A Adverse effect of other opioids, initial encounter: Secondary | ICD-10-CM

## 2018-06-10 DIAGNOSIS — Z79891 Long term (current) use of opiate analgesic: Secondary | ICD-10-CM

## 2018-06-10 DIAGNOSIS — G894 Chronic pain syndrome: Secondary | ICD-10-CM

## 2018-06-10 MED ORDER — OXYCODONE HCL 10 MG PO TABS: 10.0000 mg | ORAL_TABLET | ORAL | 0 refills | Status: DC | PRN

## 2018-06-10 NOTE — Progress Notes (Signed)
Location:   [facility] Room Number: [place] of Service:  SNF (31)   CODE STATUS: Full Code  Allergies  Allergen Reactions  . Ciprofloxacin Hives  . Penicillins Itching    Has patient had a PCN reaction causing immediate rash, facial/tongue/throat swelling, SOB or lightheadedness with hypotension: YES Has patient had a PCN reaction causing severe rash involving mucus membranes or skin necrosis: NO Has patient had a PCN reaction that required hospitalization NO Has patient had a PCN reaction occurring within the last 10 years: NO If all of the above answers are "NO", then may proceed with Cephalosporin use.  . Adhesive  [Tape] Rash  . Morphine And Related Itching and Rash    Chief Complaint  Patient presents with  . Acute Visit    Transfer in    HPI:  He has been hospitalized from 05-31-18 through 06-07-18 for acute on chronic respiratory failure with hypoxia secondary to multilobular pneumonia and sepsis. He has completed abt. He had significant orthostatic hypotension; his flomax was stopped. He has chronic back pain. 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 His drug screen upon admission to the hospital did not demonstrate any opioids in his sytem. He does complain of back pain; he denies any cough or shortness of breath. There are no reports of fever present. He will continue to be followed for his chronic illnesses including:  Copd;hypothyroidism; anemia.   Past Medical History:  Diagnosis Date  . Anxiety   . Arthritis    "I'm eat up w/it"  . Asthma   . B12 deficiency    "give myself shots"  . C. difficile colitis 2011  . Chronic liver failure ([place])   . Chronic lower back pain   . COPD (chronic obstructive pulmonary disease) ([place])   . Deep vein thrombosis (HCC)    "several"  . Depression   . GERD (gastroesophageal reflux disease)   . Hepatitis C 2011   genotype 1a.  never treated.    Marland Kitchen History of blood transfusion 2013     "related to kidneys shutting down"  . Hypertension   . Hypothyroidism   . Intermittent self-catheterization of bladder   . Liver cancer ([place])   . Metastatic lung cancer    "left"  . Migraines    "2-3/day" (05/27/2014)  . Myocardial infarction ([place]) 2007; ~ 2011  . Oxygen dependent    3L; 24/7" (05/27/2014)  . Partial small bowel obstruction ([place])   . Pneumonia    "several times"  . Pulmonary emboli (HCC)    "several"  . Shortness of breath    "all the time"  . Stroke [facility]) ~ 03/2012   "couldn't use my left hand for ~ 6 months" (05/27/2014)  . Stroke [facility]) 05/25/2014   "not able to use my left hand again" (05/27/2014)    Past Surgical History:  Procedure Laterality Date  . BIOPSY  05/29/2018   Procedure: BIOPSY;  Surgeon: Lavena Bullion, DO;  Location: [facility];  Service: Gastroenterology;;  . ESOPHAGOGASTRODUODENOSCOPY (EGD) WITH PROPOFOL N/A 05/29/2018   Procedure: ESOPHAGOGASTRODUODENOSCOPY (EGD) WITH PROPOFOL;  Surgeon: Lavena Bullion, DO;  [facility];  Service: Gastroenterology;  Laterality: N/A;  . INCISION AND DRAINAGE ABSCESS Left 05/28/2014   Procedure: INCISION AND DRAINAGE ABSCESS LEFT WRIST;  Surgeon: Leanora Cover, MD;  Location: [place];  Service: Orthopedics;  Laterality: Left;  . KNEE SURGERY Left ~ 1972   "cut top of  my kneecap off"  . MULTIPLE TOOTH EXTRACTIONS  11/2010   "26"  . TONSILLECTOMY AND ADENOIDECTOMY  1974  . TRANSURETHRAL RESECTION OF PROSTATE  2012; 2014    Social History   Socioeconomic History  . Marital status: Married    Spouse name: Not on file  . Number of children: Not on file  . Years of education: Not on file  . Highest education level: Not on file  Occupational History  . Not on file  Social Needs  . Financial resource strain: Not on file  . Food insecurity:    Worry: Not on file    Inability: Not on file  . Transportation needs:    Medical: Not on file    Non-medical: Not on file  Tobacco Use  . Smoking  status: Current Every Day Smoker    Packs/day: 0.50    Years: 41.00    Pack years: 20.50    Types: Cigarettes  . Smokeless tobacco: Former Network engineer and Sexual Activity  . Alcohol use: Yes    Comment: 8 drinks a week  . Drug use: No  . Sexual activity: Not on file  Lifestyle  . Physical activity:    Days per week: Not on file    Minutes per session: Not on file  . Stress: Not on file  Relationships  . Social connections:    Talks on phone: Not on file    Gets together: Not on file    Attends religious service: Not on file    Active member of club or organization: Not on file    Attends meetings of clubs or organizations: Not on file    Relationship status: Not on file  . Intimate partner violence:    Fear of current or ex partner: Not on file    Emotionally abused: Not on file    Physically abused: Not on file    Forced sexual activity: Not on file  Other Topics Concern  . Not on file  Social History Narrative  . Not on file   Family History  Problem Relation Age of Onset  . Cancer Mother   . Cancer Paternal Aunt   . Cancer Maternal Aunt       VITAL SIGNS BP 119/63   Pulse 84   Temp (!) 97.1 F (36.2 C)   Wt 144 lb 9.6 oz (65.6 kg)   BMI 22.65 kg/m   Outpatient Encounter Medications as of 06/10/2018  Medication Sig  . albuterol (PROVENTIL HFA;VENTOLIN HFA) 108 (90 BASE) MCG/ACT inhaler Inhale 1-2 puffs into the lungs every 6 (six) hours as needed for wheezing or shortness of breath.  . diclofenac sodium (VOLTAREN) 1 % GEL Apply 2 g topically 4 (four) times daily.  . DULoxetine (CYMBALTA) 60 MG capsule Take 1 capsule (60 mg total) by mouth 2 (two) times daily.  . ferrous sulfate 325 (65 FE) MG tablet Take 325 mg by mouth daily with breakfast.  . gabapentin (NEURONTIN) 300 MG capsule Take 600 mg by mouth 3 (three) times daily.  . hydrOXYzine (ATARAX/VISTARIL) 25 MG tablet Take 1 tablet (25 mg total) by mouth 3 (three) times daily as needed for anxiety.    . Ipratropium-Albuterol (COMBIVENT RESPIMAT) 20-100 MCG/ACT AERS respimat Inhale 1 puff into the lungs every 6 (six) hours.  Marland Kitchen levothyroxine (SYNTHROID, LEVOTHROID) 50 MCG tablet Take 1 tablet (50 mcg total) by mouth daily before breakfast.  . lidocaine (LIDODERM) 5 % Place 1 patch onto the skin daily. Remove &  Discard patch within 12 hours or as directed by MD  . nicotine (NICODERM CQ - DOSED IN MG/24 HOURS) 14 mg/24hr patch Place 1 patch (14 mg total) onto the skin daily.  . Nutritional Supplements (NUTRITIONAL SUPPLEMENT PO) NAS (No Added Salt) diet - Regular texture  . Oxycodone HCl 10 MG TABS Take 1 tablet (10 mg total) by mouth every 4 (four) hours as needed for severe pain.  . pantoprazole (PROTONIX) 40 MG tablet Take 1 tablet (40 mg total) by mouth 2 (two) times daily.  . polyethylene glycol (MIRALAX / GLYCOLAX) packet Take 17 g by mouth 2 (two) times daily.  Marland Kitchen senna-docusate (SENOKOT-S) 8.6-50 MG tablet Take 1 tablet by mouth 2 (two) times daily.  Marland Kitchen tiotropium (SPIRIVA) 18 MCG inhalation capsule Place 18 mcg into inhaler and inhale daily.  . vitamin B-12 (CYANOCOBALAMIN) 500 MCG tablet Take 500 mcg by mouth daily.   . [DISCONTINUED] FERROUS SULFATE IRON 200 (65 Fe) MG TABS Take 1 tablet by mouth at bedtime.  . [DISCONTINUED] methocarbamol (ROBAXIN) 750 MG tablet Take 750 mg by mouth 3 (three) times daily as needed for pain.   No facility-administered encounter medications on file as of 06/10/2018.      SIGNIFICANT DIAGNOSTIC EXAMS  TODAY:   05-28-18: abdominal ultrasound: Unremarkable right upper quadrant ultrasound.  05-29-18: 2-D ECHO:  - Left ventricle: The cavity size was normal. There was mild concentric hypertrophy. Systolic function was normal. The estimated ejection fraction was in the range of 60% to 65%. Wall motion was normal; there were no regional wall motion abnormalities. Doppler parameters are consistent with abnormal left ventricular relaxation (grade 1 diastolic  dysfunction). There was no evidence of elevated ventricular filling pressure by Doppler parameters. - Aortic valve: There was no regurgitation. - Mitral valve: There was trivial regurgitation. - Left atrium: The atrium was normal in size. - Right ventricle: The cavity size was moderately dilated. Wall thickness was normal. - Right atrium: The atrium was normal in size. - Pulmonary arteries: Systolic pressure was within the normal range. - Inferior vena cava: The vessel was normal in size. - Pericardium, extracardiac: There was no pericardial effusion.  05-31-18: chest x-ray:  Interval development of patchy consolidative opacities within the right mid and lower lung and left lung base concerning for pneumonia in the appropriate clinical setting. Followup PA and lateral chest X-ray is recommended in 3-4 weeks following trial of antibiotic therapy to ensure resolution and exclude underlying malignancy.   LABS REVIEWED TODAY:  05-29-18: wbc 6.7; hgb 8.7; hc6 27.9; mcv 91.;5 plt 487; glucose 89; bun 28; creat 0.99; k+ 4.5; na++ 141; ca 9.0 occult blood: + urine drug screen is neg; vit B 12: 223; iron 242; tibc 270; ferritin 61 folate 22.9 HIV nr  05-31-18: wbc 15.6; hgb 10.2; hct 30.2; mcv 88.3; plt 490; glucose 94; bun 16; creat 1.08; k+ 4.5; na++ 136; liver normal albumin 3.2 blood and urine culture: no growth  06-04-18: wbc 6.0; hgb 9.3; hct 28.0; mcv 87.5 pt 483 glucose 93; bun 9; creat 0.83; k+ 3.6; na++ 142; ca 8.9  06-07-18: wbc 7.0; hgb 9.0; hct 27.5; mcv 87.9; plt 458 glucose 94; bun 11; creat 0.96; k+ 4.0; na++ 141; ca 8.5   Review of Systems  Constitutional: Negative for malaise/fatigue.  Respiratory: Negative for cough and shortness of breath.   Cardiovascular: Negative for chest pain, palpitations and leg swelling.  Gastrointestinal: Negative for abdominal pain, constipation and heartburn.  Musculoskeletal: Positive for back pain. Negative for  joint pain and myalgias.  Skin: Negative.     Neurological: Negative for dizziness.  Psychiatric/Behavioral: The patient is not nervous/anxious.    Physical Exam  Constitutional: He is oriented to person, place, and time. No distress.  Frail   Neck: No thyromegaly present.  Cardiovascular: Normal rate, regular rhythm and intact distal pulses.  Murmur heard. 1/6  Pulmonary/Chest: Effort normal. No respiratory distress.  02 dependent  Breath sounds diminished at bases   Abdominal: Soft. Bowel sounds are normal. He exhibits no distension. There is no tenderness.  Musculoskeletal: Normal range of motion. He exhibits no edema.  Lymphadenopathy:    He has no cervical adenopathy.  Neurological: He is alert and oriented to person, place, and time.  Skin: Skin is warm and dry. He is not diaphoretic.  Psychiatric: He has a normal mood and affect.      ASSESSMENT/ PLAN:  TODAY:   1.  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 Current every day smoker: is stable is using nicotine patch 14 mg daily   3.  COPD/acute on chronic respiratory failure with hypoxia/lung cancer : is stable is 02 dependent; will continue albuterol 2 puffs every 6 hours as needed; spiriva 18 mcg daily combivent respimet 1 puff every 6 hours.   4. Normocytic anemia: is stable hgb 9.0; will continue iron daily   5. Duodenal ulcer with acute GI bleeding/liver cancer: is stable will continue protonix 40 mg twice daily   6.  Constipation due to opioid therapy: is stable will continue senna s twice daily and miralax twice daily   7. Pulmonary Emboli: is stable is not a candidate for anticoagulation due to GIB and duodenal ulcer  8. Chronic back pain/chronic pain syndrome/chronically on opiate therapy: pain is somewhat managed: will continue voltaren gel 2 gm four times daily; cymbalta 60 mg twice daily neurontin 600 mg three times daily lidoderm patch daily oxycodone 10 mg every 4 hours as needed    9. Acute urinary retention: he has history of TURP 2 times: his flomax had to be stopped due to orthostatic hypotension. Will monitor    MD is aware of resident's narcotic use and is in agreement with current plan of care. We will attempt to wean resident as apropriate   Ok Edwards NP [facility]  Contact [phone] Monday through Friday 8am- 5pm  After hours call [phone]

## 2018-06-10 NOTE — Telephone Encounter (Signed)
Rx faxed to [facility] (P) [phone], (F) [phone] 

## 2018-06-12 ENCOUNTER — Encounter: Payer: Self-pay | Admitting: Internal Medicine

## 2018-06-12 ENCOUNTER — Non-Acute Institutional Stay (SKILLED_NURSING_FACILITY): Payer: Medicaid Other | Admitting: Internal Medicine

## 2018-06-12 DIAGNOSIS — K269 Duodenal ulcer, unspecified as acute or chronic, without hemorrhage or perforation: Secondary | ICD-10-CM

## 2018-06-12 DIAGNOSIS — R339 Retention of urine, unspecified: Secondary | ICD-10-CM

## 2018-06-12 DIAGNOSIS — J9611 Chronic respiratory failure with hypoxia: Secondary | ICD-10-CM

## 2018-06-12 DIAGNOSIS — G894 Chronic pain syndrome: Secondary | ICD-10-CM

## 2018-06-12 DIAGNOSIS — E039 Hypothyroidism, unspecified: Secondary | ICD-10-CM

## 2018-06-12 DIAGNOSIS — D649 Anemia, unspecified: Secondary | ICD-10-CM

## 2018-06-12 DIAGNOSIS — I6930 Unspecified sequelae of cerebral infarction: Secondary | ICD-10-CM

## 2018-06-12 DIAGNOSIS — K7460 Unspecified cirrhosis of liver: Secondary | ICD-10-CM

## 2018-06-12 DIAGNOSIS — J449 Chronic obstructive pulmonary disease, unspecified: Secondary | ICD-10-CM | POA: Diagnosis not present

## 2018-06-12 DIAGNOSIS — Z8719 Personal history of other diseases of the digestive system: Secondary | ICD-10-CM

## 2018-06-12 DIAGNOSIS — B182 Chronic viral hepatitis C: Secondary | ICD-10-CM

## 2018-06-12 NOTE — Progress Notes (Signed)
Provider:  DR Arletha Grippe Location:  [facility] Room Number: [place] of Service:  SNF (31)  PCP: Patient, No Pcp Per Patient Care Team: Patient, No Pcp Per as PCP - General (General Practice) Harvie Junior, MD as Referring Physician (Specialist)  Extended Emergency Contact Information Primary Emergency Contact: Stanford Breed [place] Phone: [phone] Relation: Aunt Secondary Emergency Contact: Christin Fudge [place] Phone: [phone] Relation: Mother  Code Status: Full Code Goals of Care: Advanced Directive information Advanced Directives 06/12/2018  Does Patient Have a Medical Advance Directive? No  Type of Advance Directive -  Copy of [place] in Chart? -  Would patient like information on creating a medical advance directive? No - Patient declined      Chief Complaint  Patient presents with  . New Admit To SNF    Admission    HPI: Patient is a 57 y.o. male seen today for admission to SNF following hospital stay for multifocal HCAP, urinary retention, A/Chronic respiratory failure, orthostatic hypotension, sepsis, Hep C with cirrhosis, hx recurrent PE/DVT, COPD/asthma on chronic home O2, s/p symptomatic anemia 2/2 acute GIB due to NSAID, PUD. He was tx with IVF and IV abx --> po abx. Urinary retention tx with periodic I&O caths. O/p urology eval recommended. He presents to SNF for short term rehab.  Today he reports severe back pain with associated numbness in LLE and RLE weakness. No N/V. Minimum lower quadrant pain. No hematemesis. He has dyspnea with min exertion. No CP or palpitations.    MALINGERING - He has a past hx drug abuse (pills and illicit but denies IVDA; Etoh abuse). He has tob abuse hx. Reviewed Epic records. Pt suspected to have malingering and has told previous providers that he has metastatic lung cancer and liver cancer and that he was a hospice pt  but took himself off. Suspect pt seeking narcotics per Epic records  Hep C - genotype 1a; he has never been treated. Hep C quant 2.27 million with log 6.356 in Aug 2019    Past Medical History:  Diagnosis Date  . Anxiety   . Arthritis    "I'm eat up w/it"  . Asthma   . B12 deficiency    "give myself shots"  . C. difficile colitis 2011  . Chronic liver failure ([place])   . Chronic lower back pain   . COPD (chronic obstructive pulmonary disease) ([place])   . Deep vein thrombosis (HCC)    "several"  . Depression   . GERD (gastroesophageal reflux disease)   . Hepatitis C 2011   genotype 1a.  never treated.    Marland Kitchen History of blood transfusion 2013   "related to kidneys shutting down"  . Hypertension   . Hypothyroidism   . Intermittent self-catheterization of bladder   . Liver cancer ([place])   . Metastatic lung cancer    "left"  . Migraines    "2-3/day" (05/27/2014)  . Myocardial infarction ([place]) 2007; ~ 2011  . Oxygen dependent    3L; 24/7" (05/27/2014)  . Partial small bowel obstruction ([place])   . Pneumonia    "several times"  . Pulmonary emboli (HCC)    "several"  . Shortness of breath    "all the time"  . Stroke [facility]) ~ 03/2012   "couldn't use my left hand for ~ 6 months" (05/27/2014)  . Stroke [facility]) 05/25/2014   "not able to use my left hand again" (  05/27/2014)   Past Surgical History:  Procedure Laterality Date  . BIOPSY  05/29/2018   Procedure: BIOPSY;  Surgeon: Lavena Bullion, DO;  Location: [facility];  Service: Gastroenterology;;  . ESOPHAGOGASTRODUODENOSCOPY (EGD) WITH PROPOFOL N/A 05/29/2018   Procedure: ESOPHAGOGASTRODUODENOSCOPY (EGD) WITH PROPOFOL;  Surgeon: Lavena Bullion, DO;  Location: [facility];  Service: Gastroenterology;  Laterality: N/A;  . INCISION AND DRAINAGE ABSCESS Left 05/28/2014   Procedure: INCISION AND DRAINAGE ABSCESS LEFT WRIST;  Surgeon: Leanora Cover, MD;  Location: [facility];  Service: Orthopedics;  Laterality: Left;  . KNEE SURGERY Left ~  1972   "cut top of my kneecap off"  . MULTIPLE TOOTH EXTRACTIONS  11/2010   "26"  . TONSILLECTOMY AND ADENOIDECTOMY  1974  . TRANSURETHRAL RESECTION OF PROSTATE  2012; 2014    reports that he has been smoking cigarettes. He has a 20.50 pack-year smoking history. He has quit using smokeless tobacco. He reports that he drinks alcohol. He reports that he does not use drugs. Social History   Socioeconomic History  . Marital status: Married    Spouse name: Not on file  . Number of children: Not on file  . Years of education: Not on file  . Highest education level: Not on file  Occupational History  . Not on file  Social Needs  . Financial resource strain: Not on file  . Food insecurity:    Worry: Not on file    Inability: Not on file  . Transportation needs:    Medical: Not on file    Non-medical: Not on file  Tobacco Use  . Smoking status: Current Every Day Smoker    Packs/day: 0.50    Years: 41.00    Pack years: 20.50    Types: Cigarettes  . Smokeless tobacco: Former Network engineer and Sexual Activity  . Alcohol use: Yes    Comment: 8 drinks a week  . Drug use: No  . Sexual activity: Not on file  Lifestyle  . Physical activity:    Days per week: Not on file    Minutes per session: Not on file  . Stress: Not on file  Relationships  . Social connections:    Talks on phone: Not on file    Gets together: Not on file    Attends religious service: Not on file    Active member of club or organization: Not on file    Attends meetings of clubs or organizations: Not on file    Relationship status: Not on file  . Intimate partner violence:    Fear of current or ex partner: Not on file    Emotionally abused: Not on file    Physically abused: Not on file    Forced sexual activity: Not on file  Other Topics Concern  . Not on file  Social History Narrative  . Not on file    Functional Status Survey:    Family History  Problem Relation Age of Onset  . Cancer Mother     . Cancer Paternal Aunt   . Cancer Maternal Aunt     Health Maintenance  Topic Date Due  . INFLUENZA VACCINE  08/11/2018 (Originally 05/01/2018)  . COLONOSCOPY  06/11/2019 (Originally 05/03/2011)  . TETANUS/TDAP  06/11/2019 (Originally 05/02/1980)  . Hepatitis C Screening  Completed  . HIV Screening  Completed    Allergies  Allergen Reactions  . Ciprofloxacin Hives  . Penicillins Itching    Has patient had a PCN reaction causing immediate  rash, facial/tongue/throat swelling, SOB or lightheadedness with hypotension: YES Has patient had a PCN reaction causing severe rash involving mucus membranes or skin necrosis: NO Has patient had a PCN reaction that required hospitalization NO Has patient had a PCN reaction occurring within the last 10 years: NO If all of the above answers are "NO", then may proceed with Cephalosporin use.  . Adhesive  [Tape] Rash  . Morphine And Related Itching and Rash    Outpatient Encounter Medications as of 06/12/2018  Medication Sig  . albuterol (PROVENTIL HFA;VENTOLIN HFA) 108 (90 Base) MCG/ACT inhaler Inhale 2 puffs into the lungs every 6 (six) hours as needed for wheezing or shortness of breath.  . diclofenac sodium (VOLTAREN) 1 % GEL Apply 2 g topically 4 (four) times daily.  . DULoxetine (CYMBALTA) 60 MG capsule Take 1 capsule (60 mg total) by mouth 2 (two) times daily.  . ferrous sulfate 325 (65 FE) MG tablet Take 325 mg by mouth daily with breakfast.  . gabapentin (NEURONTIN) 300 MG capsule Take 600 mg by mouth 3 (three) times daily.  . hydrOXYzine (ATARAX/VISTARIL) 25 MG tablet Take 1 tablet (25 mg total) by mouth 3 (three) times daily as needed for anxiety.  . Ipratropium-Albuterol (COMBIVENT RESPIMAT) 20-100 MCG/ACT AERS respimat Inhale 1 puff into the lungs every 6 (six) hours.  Marland Kitchen levothyroxine (SYNTHROID, LEVOTHROID) 50 MCG tablet Take 1 tablet (50 mcg total) by mouth daily before breakfast.  . lidocaine (LIDODERM) 5 % Place 1 patch onto the skin  daily. Remove & Discard patch within 12 hours or as directed by MD  . nicotine (NICODERM CQ - DOSED IN MG/24 HOURS) 14 mg/24hr patch Place 1 patch (14 mg total) onto the skin daily.  . Nutritional Supplements (NUTRITIONAL SUPPLEMENT PO) NAS (No Added Salt) diet - Regular texture  . Oxycodone HCl 10 MG TABS Take 1 tablet (10 mg total) by mouth every 4 (four) hours as needed.  . OXYGEN Place 2 L/min into the nose continuous.  . pantoprazole (PROTONIX) 40 MG tablet Take 1 tablet (40 mg total) by mouth 2 (two) times daily.  . polyethylene glycol (MIRALAX / GLYCOLAX) packet Take 17 g by mouth 2 (two) times daily.  Marland Kitchen senna-docusate (SENOKOT-S) 8.6-50 MG tablet Take 1 tablet by mouth 2 (two) times daily.  Marland Kitchen tiotropium (SPIRIVA) 18 MCG inhalation capsule Place 18 mcg into inhaler and inhale daily.  . vitamin B-12 (CYANOCOBALAMIN) 500 MCG tablet Take 500 mcg by mouth daily.   . [DISCONTINUED] albuterol (PROVENTIL HFA;VENTOLIN HFA) 108 (90 BASE) MCG/ACT inhaler Inhale 1-2 puffs into the lungs every 6 (six) hours as needed for wheezing or shortness of breath. (Patient not taking: Reported on 06/12/2018)   No facility-administered encounter medications on file as of 06/12/2018.     Review of Systems  Respiratory: Positive for shortness of breath.   Musculoskeletal: Positive for arthralgias, back pain and myalgias.  Neurological: Positive for numbness.  All other systems reviewed and are negative.   Vitals:   06/12/18 0940  BP: 119/63  Pulse: 84  Resp: 18  Temp: 97.8 F (36.6 C)  SpO2: 99%  Weight: 144 lb 9.6 oz (65.6 kg)  Height: 5\' 7"  (1.702 m)   Body mass index is 22.65 kg/m. Physical Exam  Constitutional: He appears well-developed.  Frail appearing in NAD, sitting on edge of bed. [place] O2 NOT intact  HENT:  Mouth/Throat: Oropharynx is clear and moist.  MMM; no oral thrush  Eyes: Pupils are equal, round, and reactive to light.  No scleral icterus.  Neck: Neck supple. Carotid bruit is not  present. No thyromegaly present.  Cardiovascular: Normal rate, regular rhythm and intact distal pulses. Exam reveals no gallop and no friction rub.  Murmur heard.  Systolic murmur is present with a grade of 1/6. no distal LE swelling. No calf TTP  Pulmonary/Chest: Effort normal. He has decreased breath sounds (b/l at base). He has no wheezes. He has no rales. He exhibits no tenderness.  Abdominal: Soft. Bowel sounds are normal. He exhibits no distension, no abdominal bruit, no pulsatile midline mass and no mass. There is no hepatomegaly. There is tenderness. There is no rebound and no guarding. No hernia.  Musculoskeletal: He exhibits edema and tenderness.  Lymphadenopathy:    He has no cervical adenopathy.  Neurological: He is alert. He has normal reflexes.  Skin: Skin is warm and dry. No rash noted.  Psychiatric: He has a normal mood and affect. His behavior is normal. Judgment and thought content normal. His speech is tangential.    Labs reviewed: Basic Metabolic Panel: Recent Labs    06/05/18 0636 06/06/18 0602 06/07/18 0608  NA 142 141 141  K 3.6 4.0 4.0  CL 105 106 106  CO2 28 26 27   GLUCOSE 93 89 94  BUN 9 10 11   CREATININE 0.83 0.87 0.96  CALCIUM 8.9 8.5* 8.5*   Liver Function Tests: Recent Labs    05/29/18 0443 05/31/18 1834 06/01/18 0425  AST 29 39 24  ALT 22 26 20   ALKPHOS 45 53 51  BILITOT 1.4* 1.0 0.4  PROT 5.0* 6.0* 5.1*  ALBUMIN 2.6* 3.2* 2.7*   No results for input(s): LIPASE, AMYLASE in the last 8760 hours. No results for input(s): AMMONIA in the last 8760 hours. CBC: Recent Labs    05/31/18 1834  06/04/18 0910 06/05/18 0636 06/07/18 0608  WBC 15.6*   < > 6.0 7.3 7.0  NEUTROABS 12.4*  --  3.4 4.4  --   HGB 10.2*   < > 9.3* 10.1* 9.0*  HCT 30.2*   < > 28.0* 30.4* 27.5*  MCV 88.3   < > 87.5 87.6 87.9  PLT 490*   < > 483* 468* 458*   < > = values in this interval not displayed.   Cardiac Enzymes: No results for input(s): CKTOTAL, CKMB,  CKMBINDEX, TROPONINI in the last 8760 hours. BNP: Invalid input(s): POCBNP No results found for: HGBA1C No results found for: TSH Lab Results  Component Value Date   VITAMINB12 223 05/29/2018   Lab Results  Component Value Date   FOLATE 22.9 05/29/2018   Lab Results  Component Value Date   IRON 242 (H) 05/29/2018   TIBC 270 05/29/2018   FERRITIN 61 05/29/2018    Imaging and Procedures obtained prior to SNF admission: Dg Chest 2 View  Result Date: 05/31/2018 CLINICAL DATA:  Cough.  Low-grade fever. EXAM: CHEST - 2 VIEW COMPARISON:  Chest radiograph 05/28/2018 FINDINGS: Monitoring leads overlie the patient. Stable cardiac and mediastinal contours. Interval development of patchy consolidative opacities within the right mid and lower lung and left lung base. No pleural effusion or pneumothorax. Thoracic spine degenerative changes. IMPRESSION: Interval development of patchy consolidative opacities within the right mid and lower lung and left lung base concerning for pneumonia in the appropriate clinical setting. Followup PA and lateral chest X-ray is recommended in 3-4 weeks following trial of antibiotic therapy to ensure resolution and exclude underlying malignancy. Electronically Signed   By: Polly Cobia.D.  On: 05/31/2018 19:51    Assessment/Plan   ICD-10-CM   1. Chronic pain syndrome G89.4   2. Chronic respiratory failure with hypoxia (HCC) J96.11   3. Chronic obstructive pulmonary disease, unspecified COPD type ([place]) J44.9   4. 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 Duodenal ulcer K26.9   6. Cirrhosis of liver without ascites, unspecified hepatic cirrhosis type ([place]) K74.60   7. Normocytic anemia D64.9   8. History of GI bleed Z87.19   9. Acquired hypothyroidism E03.9   10. Chronic hepatitis C without hepatic coma (HCC) B18.2    never treated  11. History of CVA with residual deficit I69.30     Cont current meds as ordered  PT/OT/ST as  ordered  F/u with GI as scheduled  F/u with other specialists as scheduled  Cont [place] O2 as ordered ATC  GOAL: short term rehab and d/c home when medically appropriate. Communicated with pt and nursing.  Labs/tests ordered: none    Dadrian Ballantine S. Perlie Gold  [facility] [address] [place], [place] [address] [phone] Cell (Monday-Friday 8 AM - 5 PM) [phone] After 5 PM and follow prompts

## 2018-06-18 ENCOUNTER — Non-Acute Institutional Stay (SKILLED_NURSING_FACILITY): Payer: Medicaid Other | Admitting: Adult Health

## 2018-06-18 ENCOUNTER — Encounter: Payer: Self-pay | Admitting: Adult Health

## 2018-06-18 DIAGNOSIS — J9621 Acute and chronic respiratory failure with hypoxia: Secondary | ICD-10-CM

## 2018-06-18 DIAGNOSIS — J449 Chronic obstructive pulmonary disease, unspecified: Secondary | ICD-10-CM | POA: Diagnosis not present

## 2018-06-18 DIAGNOSIS — E034 Atrophy of thyroid (acquired): Secondary | ICD-10-CM

## 2018-06-18 DIAGNOSIS — F17200 Nicotine dependence, unspecified, uncomplicated: Secondary | ICD-10-CM | POA: Diagnosis not present

## 2018-06-18 NOTE — Progress Notes (Signed)
Location:   [facility] Room Number: [place] of Service:  SNF (31)   CODE STATUS: Full Code  Allergies  Allergen Reactions  . Ciprofloxacin Hives  . Penicillins Itching    Has patient had a PCN reaction causing immediate rash, facial/tongue/throat swelling, SOB or lightheadedness with hypotension: YES Has patient had a PCN reaction causing severe rash involving mucus membranes or skin necrosis: NO Has patient had a PCN reaction that required hospitalization NO Has patient had a PCN reaction occurring within the last 10 years: NO If all of the above answers are "NO", then may proceed with Cephalosporin use.  . Adhesive  [Tape] Rash  . Morphine And Related Itching and Rash    Chief Complaint  Patient presents with  . Medical Management of Chronic Issues    COPD: acute on chronic respiratory failure; hypothyroidism; smoker. Weekly follow up for the first 30 days post hospitalization.     HPI:  He is a 57 year old short term rehab patient being seen for the management of his chronic illnesses: copd; acute on chronic respiratory failure; hypothyroidism; smoker. He does continue to have back pain with radiation down legs and into groins. He denies any changes in appetite; no anxiety.   Past Medical History:  Diagnosis Date  . Anxiety   . Arthritis    "I'm eat up w/it"  . Asthma   . B12 deficiency    "give myself shots"  . C. difficile colitis 2011  . Chronic liver failure ([place])   . Chronic lower back pain   . COPD (chronic obstructive pulmonary disease) ([place])   . Deep vein thrombosis (HCC)    "several"  . Depression   . GERD (gastroesophageal reflux disease)   . Hepatitis C 2011   genotype 1a.  never treated.    Marland Kitchen History of blood transfusion 2013   "related to kidneys shutting down"  . Hypertension   . Hypothyroidism   . Intermittent self-catheterization of bladder   . Liver cancer ([place])   . Metastatic lung cancer    "left"  . Migraines    "2-3/day" (05/27/2014)  . Myocardial infarction ([place]) 2007; ~ 2011  . Oxygen dependent    3L; 24/7" (05/27/2014)  . Partial small bowel obstruction ([place])   . Pneumonia    "several times"  . Pulmonary emboli (HCC)    "several"  . Shortness of breath    "all the time"  . Stroke [facility]) ~ 03/2012   "couldn't use my left hand for ~ 6 months" (05/27/2014)  . Stroke [facility]) 05/25/2014   "not able to use my left hand again" (05/27/2014)    Past Surgical History:  Procedure Laterality Date  . BIOPSY  05/29/2018   Procedure: BIOPSY;  Surgeon: Lavena Bullion, DO;  Location: [place] [facility];  Service: Gastroenterology;;  . ESOPHAGOGASTRODUODENOSCOPY (EGD) WITH PROPOFOL N/A 05/29/2018   Procedure: ESOPHAGOGASTRODUODENOSCOPY (EGD) WITH PROPOFOL;  Surgeon: Lavena Bullion, DO;  Location: [place];  Service: Gastroenterology;  Laterality: N/A;  . INCISION AND DRAINAGE ABSCESS Left 05/28/2014   Procedure: INCISION AND DRAINAGE ABSCESS LEFT WRIST;  Surgeon: Leanora Cover, MD;  Location: [place];  Service: Orthopedics;  Laterality: Left;  . KNEE SURGERY Left ~ 1972   "cut top of my kneecap off"  . MULTIPLE TOOTH EXTRACTIONS  11/2010   "26"  . TONSILLECTOMY AND ADENOIDECTOMY  1974  . TRANSURETHRAL RESECTION OF PROSTATE  2012; 2014    Social History   Socioeconomic History  .  Marital status: Married    Spouse name: Not on file  . Number of children: Not on file  . Years of education: Not on file  . Highest education level: Not on file  Occupational History  . Not on file  Social Needs  . Financial resource strain: Not on file  . Food insecurity:    Worry: Not on file    Inability: Not on file  . Transportation needs:    Medical: Not on file    Non-medical: Not on file  Tobacco Use  . Smoking status: Current Every Day Smoker    Packs/day: 0.50    Years: 41.00    Pack years: 20.50    Types: Cigarettes  . Smokeless tobacco: Former Network engineer and Sexual Activity  . Alcohol use: Yes     Comment: 8 drinks a week  . Drug use: No  . Sexual activity: Not on file  Lifestyle  . Physical activity:    Days per week: Not on file    Minutes per session: Not on file  . Stress: Not on file  Relationships  . Social connections:    Talks on phone: Not on file    Gets together: Not on file    Attends religious service: Not on file    Active member of club or organization: Not on file    Attends meetings of clubs or organizations: Not on file    Relationship status: Not on file  . Intimate partner violence:    Fear of current or ex partner: Not on file    Emotionally abused: Not on file    Physically abused: Not on file    Forced sexual activity: Not on file  Other Topics Concern  . Not on file  Social History Narrative  . Not on file   Family History  Problem Relation Age of Onset  . Cancer Mother   . Cancer Paternal Aunt   . Cancer Maternal Aunt       VITAL SIGNS BP 119/63   Pulse 84   Temp 97.8 F (36.6 C)   Resp 18   Ht 5\' 7"  (1.702 m)   Wt 144 lb 9.6 oz (65.6 kg)   SpO2 99%   BMI 22.65 kg/m   Outpatient Encounter Medications as of 06/18/2018  Medication Sig  . albuterol (PROVENTIL HFA;VENTOLIN HFA) 108 (90 Base) MCG/ACT inhaler Inhale 2 puffs into the lungs every 6 (six) hours as needed for wheezing or shortness of breath.  . diclofenac sodium (VOLTAREN) 1 % GEL Apply 2 g topically 4 (four) times daily.  . DULoxetine (CYMBALTA) 60 MG capsule Take 1 capsule (60 mg total) by mouth 2 (two) times daily.  . ferrous sulfate 325 (65 FE) MG tablet Take 325 mg by mouth daily with breakfast.  . gabapentin (NEURONTIN) 300 MG capsule Take 600 mg by mouth 3 (three) times daily.  . hydrOXYzine (ATARAX/VISTARIL) 25 MG tablet Take 1 tablet (25 mg total) by mouth 3 (three) times daily as needed for anxiety.  . Ipratropium-Albuterol (COMBIVENT RESPIMAT) 20-100 MCG/ACT AERS respimat Inhale 1 puff into the lungs every 6 (six) hours.  Marland Kitchen levothyroxine (SYNTHROID, LEVOTHROID)  50 MCG tablet Take 1 tablet (50 mcg total) by mouth daily before breakfast.  . lidocaine (LIDODERM) 5 % Place 1 patch onto the skin daily. Remove & Discard patch within 12 hours or as directed by MD  . nicotine (NICODERM CQ - DOSED IN MG/24 HOURS) 14 mg/24hr patch Place 1 patch (  14 mg total) onto the skin daily.  . Nutritional Supplements (NUTRITIONAL SUPPLEMENT PO) NAS (No Added Salt) diet - Regular texture  . Oxycodone HCl 10 MG TABS Take 1 tablet (10 mg total) by mouth every 4 (four) hours as needed.  . OXYGEN Place 2 L/min into the nose continuous.  . pantoprazole (PROTONIX) 40 MG tablet Take 1 tablet (40 mg total) by mouth 2 (two) times daily.  . polyethylene glycol (MIRALAX / GLYCOLAX) packet Take 17 g by mouth 2 (two) times daily.  Marland Kitchen senna-docusate (SENOKOT-S) 8.6-50 MG tablet Take 1 tablet by mouth 2 (two) times daily.  Marland Kitchen tiotropium (SPIRIVA) 18 MCG inhalation capsule Place 18 mcg into inhaler and inhale daily.  . vitamin B-12 (CYANOCOBALAMIN) 500 MCG tablet Take 500 mcg by mouth daily.    No facility-administered encounter medications on file as of 06/18/2018.      SIGNIFICANT DIAGNOSTIC EXAMS  PREVIOUS:   05-28-18: abdominal ultrasound: Unremarkable right upper quadrant ultrasound.  05-29-18: 2-D ECHO:  - Left ventricle: The cavity size was normal. There was mild concentric hypertrophy. Systolic function was normal. The estimated ejection fraction was in the range of 60% to 65%. Wall motion was normal; there were no regional wall motion abnormalities. Doppler parameters are consistent with abnormal left ventricular relaxation (grade 1 diastolic dysfunction). There was no evidence of elevated ventricular filling pressure by Doppler parameters. - Aortic valve: There was no regurgitation. - Mitral valve: There was trivial regurgitation. - Left atrium: The atrium was normal in size. - Right ventricle: The cavity size was moderately dilated. Wall thickness was normal. - Right atrium:  The atrium was normal in size. - Pulmonary arteries: Systolic pressure was within the normal range. - Inferior vena cava: The vessel was normal in size. - Pericardium, extracardiac: There was no pericardial effusion.  05-31-18: chest x-ray:  Interval development of patchy consolidative opacities within the right mid and lower lung and left lung base concerning for pneumonia in the appropriate clinical setting. Followup PA and lateral chest X-ray is recommended in 3-4 weeks following trial of antibiotic therapy to ensure resolution and exclude underlying malignancy.  NO NEW EXAM    LABS REVIEWED PREVIOUS:  05-29-18: wbc 6.7; hgb 8.7; hc6 27.9; mcv 91.;5 plt 487; glucose 89; bun 28; creat 0.99; k+ 4.5; na++ 141; ca 9.0 occult blood: + urine drug screen is neg; vit B 12: 223; iron 242; tibc 270; ferritin 61 folate 22.9 HIV nr  05-31-18: wbc 15.6; hgb 10.2; hct 30.2; mcv 88.3; plt 490; glucose 94; bun 16; creat 1.08; k+ 4.5; na++ 136; liver normal albumin 3.2 blood and urine culture: no growth  06-04-18: wbc 6.0; hgb 9.3; hct 28.0; mcv 87.5 pt 483 glucose 93; bun 9; creat 0.83; k+ 3.6; na++ 142; ca 8.9  06-07-18: wbc 7.0; hgb 9.0; hct 27.5; mcv 87.9; plt 458 glucose 94; bun 11; creat 0.96; k+ 4.0; na++ 141; ca 8.5   NO NEW LABS.    Review of Systems  Constitutional: Negative for malaise/fatigue.  Respiratory: Negative for cough and shortness of breath.   Cardiovascular: Negative for chest pain, palpitations and leg swelling.  Gastrointestinal: Negative for abdominal pain, constipation and heartburn.  Musculoskeletal: Positive for myalgias. Negative for back pain and joint pain.       Back leg and groin pain   Skin: Negative.   Neurological: Negative for dizziness.  Psychiatric/Behavioral: The patient is not nervous/anxious.     Physical Exam  Constitutional: He is oriented to person, place, and  time. No distress.  Frail   Neck: No thyromegaly present.  Cardiovascular: Normal rate, regular  rhythm and intact distal pulses.  Murmur heard. 1/6  Pulmonary/Chest: Effort normal. No respiratory distress.  02 dependent Breath sounds diminished in the bases  Abdominal: Soft. Bowel sounds are normal. He exhibits no distension. There is no tenderness.  Musculoskeletal: Normal range of motion. He exhibits no edema.  Lymphadenopathy:    He has no cervical adenopathy.  Neurological: He is alert and oriented to person, place, and time.  Skin: Skin is warm and dry. He is not diaphoretic.  Psychiatric: He has a normal mood and affect.     ASSESSMENT/ PLAN:  TODAY:   1.  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 Current every day smoker: is stable is using nicotine patch 14 mg daily   3.  COPD/acute on chronic respiratory failure with hypoxia/lung cancer : is stable is 02 dependent; will continue albuterol 2 puffs every 6 hours as needed; spiriva 18 mcg daily combivent respimet 1 puff every 6 hours.   PREVIOUS  4. Normocytic anemia: is stable hgb 9.0; will continue iron daily   5. Duodenal ulcer with acute GI bleeding/liver cancer: is stable will continue protonix 40 mg twice daily   6.  Constipation due to opioid therapy: is stable will continue senna s twice daily and miralax twice daily   7. Pulmonary Emboli: is stable is not a candidate for anticoagulation due to GIB and duodenal ulcer  8. Chronic back pain/chronic pain syndrome/chronically on opiate therapy: pain is not well managed: will continue voltaren gel 2 gm four times daily; cymbalta 60 mg twice daily neurontin 600 mg three times daily lidoderm patch daily oxycodone 10 mg every 4 hours as needed   9. Acute urinary retention: he has history of TURP 2 times: his flomax had to be stopped due to orthostatic hypotension. Will monitor   MD is aware of resident's narcotic use and is in agreement with current plan of care. We will attempt to wean resident as  apropriate   Ok Edwards NP [facility]  Contact [phone] Monday through Friday 8am- 5pm  After hours call [phone]

## 2018-06-19 ENCOUNTER — Ambulatory Visit: Payer: Self-pay | Admitting: Gastroenterology

## 2018-06-24 ENCOUNTER — Non-Acute Institutional Stay (SKILLED_NURSING_FACILITY): Payer: Medicaid Other | Admitting: Adult Health

## 2018-06-24 ENCOUNTER — Encounter: Payer: Self-pay | Admitting: Adult Health

## 2018-06-24 DIAGNOSIS — K5903 Drug induced constipation: Secondary | ICD-10-CM | POA: Diagnosis not present

## 2018-06-24 DIAGNOSIS — I2609 Other pulmonary embolism with acute cor pulmonale: Secondary | ICD-10-CM | POA: Diagnosis not present

## 2018-06-24 DIAGNOSIS — M5441 Lumbago with sciatica, right side: Secondary | ICD-10-CM

## 2018-06-24 DIAGNOSIS — T402X5A Adverse effect of other opioids, initial encounter: Secondary | ICD-10-CM

## 2018-06-24 DIAGNOSIS — F17200 Nicotine dependence, unspecified, uncomplicated: Secondary | ICD-10-CM | POA: Insufficient documentation

## 2018-06-24 DIAGNOSIS — E034 Atrophy of thyroid (acquired): Secondary | ICD-10-CM | POA: Insufficient documentation

## 2018-06-24 DIAGNOSIS — M5442 Lumbago with sciatica, left side: Secondary | ICD-10-CM

## 2018-06-24 DIAGNOSIS — G894 Chronic pain syndrome: Secondary | ICD-10-CM | POA: Diagnosis not present

## 2018-06-24 DIAGNOSIS — G8929 Other chronic pain: Secondary | ICD-10-CM

## 2018-06-24 NOTE — Progress Notes (Signed)
Location:   [facility] Room Number: [place] of Service:  SNF (31)   CODE STATUS: Full Code  Allergies  Allergen Reactions  . Ciprofloxacin Hives  . Penicillins Itching    Has patient had a PCN reaction causing immediate rash, facial/tongue/throat swelling, SOB or lightheadedness with hypotension: YES Has patient had a PCN reaction causing severe rash involving mucus membranes or skin necrosis: NO Has patient had a PCN reaction that required hospitalization NO Has patient had a PCN reaction occurring within the last 10 years: NO If all of the above answers are "NO", then may proceed with Cephalosporin use.  . Adhesive  [Tape] Rash  . Morphine And Related Itching and Rash    Chief Complaint  Patient presents with  . Medical Management of Chronic Issues    Chronic back pain; PE; constipation. Weekly follow up for the first 30 days post hospitalization.     HPI:  He is a 57 year old short term rehab patient being seen for the management of his chronic illnesses: back pain; PE; constipation. He denies any constipation; no changes in appetite. He does have uncontrolled back pain which radiates to bilateral hips and groins. We have discussed his pain management. Increasing the oxycodone or making it routine will not help his pain management. We will setup a pain clinic consult for him.   Past Medical History:  Diagnosis Date  . Abnormal posture   . Acute and chronic respiratory failure with hypoxia ([place])   . Anxiety   . Arthritis    "I'm eat up w/it"  . Asthma   . B12 deficiency    "give myself shots"  . C. difficile colitis 2011  . Cerebral infarction ( [place])   . Chronic liver failure ([place])   . Chronic lower back pain   . Chronic pain syndrome   . Chronic viral hepatitis C ([place])   . Cirrhosis ([place])   . COPD (chronic obstructive pulmonary disease) ([place])   . Deep vein thrombosis (HCC)    "several"  . Depression   . Gastrointestinal hemorrhage    unspecified   . GERD (gastroesophageal reflux disease)   . Hepatitis C 2011   genotype 1a.  never treated.    Marland Kitchen History of blood transfusion 2013   "related to kidneys shutting down"  . Hypertension   . Hypothyroidism   . Intermittent self-catheterization of bladder   . Liver cancer ([place])   . Metastatic lung cancer    "left"  . Migraines    "2-3/day" (05/27/2014)  . Muscle weakness   . Myocardial infarction ([place]) 2007; ~ 2011  . Other abnormalities of gait and mobility   . Oxygen dependent    3L; 24/7" (05/27/2014)  . Partial small bowel obstruction ([place])   . Personal history of transient ischemic attack (TIA), and cerebral infarction without residual deficits   . Pneumonia    "several times"  . Pulmonary emboli (HCC)    "several"  . Shortness of breath    "all the time"  . Stroke [facility]) ~ 03/2012   "couldn't use my left hand for ~ 6 months" (05/27/2014)  . Stroke [facility]) 05/25/2014   "not able to use my left hand again" (05/27/2014)  . Unspecified asthma with (acute) exacerbation     Past Surgical History:  Procedure Laterality Date  . BIOPSY  05/29/2018   Procedure: BIOPSY;  Surgeon: Lavena Bullion, DO;  Location: [place] [facility];  Service: Gastroenterology;;  . ESOPHAGOGASTRODUODENOSCOPY (EGD) WITH  PROPOFOL N/A 05/29/2018   Procedure: ESOPHAGOGASTRODUODENOSCOPY (EGD) WITH PROPOFOL;  Surgeon: Lavena Bullion, DO;  Location: [place];  Service: Gastroenterology;  Laterality: N/A;  . INCISION AND DRAINAGE ABSCESS Left 05/28/2014   Procedure: INCISION AND DRAINAGE ABSCESS LEFT WRIST;  Surgeon: Leanora Cover, MD;  Location: [place];  Service: Orthopedics;  Laterality: Left;  . KNEE SURGERY Left ~ 1972   "cut top of my kneecap off"  . MULTIPLE TOOTH EXTRACTIONS  11/2010   "26"  . NECK SURGERY     x4   . TONSILLECTOMY AND ADENOIDECTOMY  1974  . TOTAL HIP ARTHROPLASTY Right 03/2018  . TRANSURETHRAL RESECTION OF PROSTATE  2012; 2014    Social History   Socioeconomic History    . Marital status: Married    Spouse name: Not on file  . Number of children: 3  . Years of education: Not on file  . Highest education level: Not on file  Occupational History  . Not on file  Social Needs  . Financial resource strain: Not on file  . Food insecurity:    Worry: Not on file    Inability: Not on file  . Transportation needs:    Medical: Not on file    Non-medical: Not on file  Tobacco Use  . Smoking status: Former Smoker    Packs/day: 0.50    Years: 41.00    Pack years: 20.50    Types: Cigarettes  . Smokeless tobacco: Former Systems developer  . Tobacco comment: quit in February 2019  Substance and Sexual Activity  . Alcohol use: Yes    Comment: 8 drinks a week  . Drug use: No  . Sexual activity: Not on file  Lifestyle  . Physical activity:    Days per week: Not on file    Minutes per session: Not on file  . Stress: Not on file  Relationships  . Social connections:    Talks on phone: Not on file    Gets together: Not on file    Attends religious service: Not on file    Active member of club or organization: Not on file    Attends meetings of clubs or organizations: Not on file    Relationship status: Not on file  . Intimate partner violence:    Fear of current or ex partner: Not on file    Emotionally abused: Not on file    Physically abused: Not on file    Forced sexual activity: Not on file  Other Topics Concern  . Not on file  Social History Narrative  . Not on file   Family History  Problem Relation Age of Onset  . Breast cancer Mother   . Cancer Paternal Aunt   . Cancer Maternal Aunt        think it is breast      VITAL SIGNS BP 124/88   Pulse 88   Temp (!) 97 F (36.1 C)   Resp 16   Ht 5\' 7"  (1.702 m)   Wt 148 lb 3.2 oz (67.2 kg)   SpO2 96%   BMI 23.21 kg/m   Outpatient Encounter Medications as of 06/24/2018  Medication Sig  . albuterol (PROVENTIL HFA;VENTOLIN HFA) 108 (90 Base) MCG/ACT inhaler Inhale 2 puffs into the lungs every 6  (six) hours as needed for wheezing or shortness of breath.  . diclofenac sodium (VOLTAREN) 1 % GEL Apply 2 g topically 4 (four) times daily.  . DULoxetine (CYMBALTA) 60 MG capsule Take 1 capsule (  60 mg total) by mouth 2 (two) times daily.  . ferrous sulfate 325 (65 FE) MG tablet Take 325 mg by mouth daily with breakfast.  . gabapentin (NEURONTIN) 600 MG tablet Take 600 mg by mouth 3 (three) times daily.   . hydrOXYzine (ATARAX/VISTARIL) 25 MG tablet Take 1 tablet (25 mg total) by mouth 3 (three) times daily as needed for anxiety.  . Ipratropium-Albuterol (COMBIVENT RESPIMAT) 20-100 MCG/ACT AERS respimat Inhale 1 puff into the lungs every 6 (six) hours.  Marland Kitchen levothyroxine (SYNTHROID, LEVOTHROID) 50 MCG tablet Take 1 tablet (50 mcg total) by mouth daily before breakfast.  . lidocaine (LIDODERM) 5 % Place 1 patch onto the skin daily. Remove & Discard patch within 12 hours or as directed by MD  . Multiple Vitamin (MULTIVITAMIN) tablet Take 1 tablet by mouth daily.  . nicotine (NICODERM CQ - DOSED IN MG/24 HOURS) 14 mg/24hr patch Place 1 patch (14 mg total) onto the skin daily.  . Nutritional Supplements (NUTRITIONAL SUPPLEMENT PO) NAS (No Added Salt) diet - Regular texture  . Nutritional Supplements (PROMOD) LIQD Give 30 cc by mouth three times daily  . Oxycodone HCl 10 MG TABS Take 1 tablet (10 mg total) by mouth every 4 (four) hours as needed.  . OXYGEN Place 2 L/min into the nose continuous.  . pantoprazole (PROTONIX) 40 MG tablet Take 1 tablet (40 mg total) by mouth 2 (two) times daily.  . polyethylene glycol (MIRALAX / GLYCOLAX) packet Take 17 g by mouth 2 (two) times daily.  Marland Kitchen senna-docusate (SENOKOT-S) 8.6-50 MG tablet Take 1 tablet by mouth 2 (two) times daily.  Marland Kitchen tiotropium (SPIRIVA) 18 MCG inhalation capsule Place 18 mcg into inhaler and inhale daily.  . vitamin B-12 (CYANOCOBALAMIN) 500 MCG tablet Take 500 mcg by mouth daily.    No facility-administered encounter medications on file as of  06/24/2018.      SIGNIFICANT DIAGNOSTIC EXAMS   PREVIOUS:   05-28-18: abdominal ultrasound: Unremarkable right upper quadrant ultrasound.  05-29-18: 2-D ECHO:  - Left ventricle: The cavity size was normal. There was mild concentric hypertrophy. Systolic function was normal. The estimated ejection fraction was in the range of 60% to 65%. Wall motion was normal; there were no regional wall motion abnormalities. Doppler parameters are consistent with abnormal left ventricular relaxation (grade 1 diastolic dysfunction). There was no evidence of elevated ventricular filling pressure by Doppler parameters. - Aortic valve: There was no regurgitation. - Mitral valve: There was trivial regurgitation. - Left atrium: The atrium was normal in size. - Right ventricle: The cavity size was moderately dilated. Wall thickness was normal. - Right atrium: The atrium was normal in size. - Pulmonary arteries: Systolic pressure was within the normal range. - Inferior vena cava: The vessel was normal in size. - Pericardium, extracardiac: There was no pericardial effusion.  05-31-18: chest x-ray:  Interval development of patchy consolidative opacities within the right mid and lower lung and left lung base concerning for pneumonia in the appropriate clinical setting. Followup PA and lateral chest X-ray is recommended in 3-4 weeks following trial of antibiotic therapy to ensure resolution and exclude underlying malignancy.  NO NEW EXAM    LABS REVIEWED PREVIOUS:  05-29-18: wbc 6.7; hgb 8.7; hc6 27.9; mcv 91.;5 plt 487; glucose 89; bun 28; creat 0.99; k+ 4.5; na++ 141; ca 9.0 occult blood: + urine drug screen is neg; vit B 12: 223; iron 242; tibc 270; ferritin 61 folate 22.9 HIV nr  05-31-18: wbc 15.6; hgb 10.2; hct 30.2;  mcv 88.3; plt 490; glucose 94; bun 16; creat 1.08; k+ 4.5; na++ 136; liver normal albumin 3.2 blood and urine culture: no growth  06-04-18: wbc 6.0; hgb 9.3; hct 28.0; mcv 87.5 pt 483 glucose 93;  bun 9; creat 0.83; k+ 3.6; na++ 142; ca 8.9  06-07-18: wbc 7.0; hgb 9.0; hct 27.5; mcv 87.9; plt 458 glucose 94; bun 11; creat 0.96; k+ 4.0; na++ 141; ca 8.5   NO NEW LABS.    Review of Systems  Constitutional: Negative for malaise/fatigue.  Respiratory: Negative for cough and shortness of breath.   Cardiovascular: Negative for chest pain, palpitations and leg swelling.  Gastrointestinal: Negative for abdominal pain, constipation and heartburn.  Musculoskeletal: Positive for back pain, joint pain and myalgias.       Has poorly controlled back groin and bilateral hip pain   Skin: Negative.   Neurological: Negative for dizziness.  Psychiatric/Behavioral: The patient is not nervous/anxious.    Physical Exam  Constitutional: He is oriented to person, place, and time. No distress.  Frail   Neck: No thyromegaly present.  Cardiovascular: Normal rate, regular rhythm and intact distal pulses.  Murmur heard. 1/6  Pulmonary/Chest: Effort normal. No respiratory distress.  02 dependent Bases diminished bilaterally   Abdominal: Soft. Bowel sounds are normal. He exhibits no distension. There is no tenderness.  Musculoskeletal: Normal range of motion. He exhibits no edema.  Lymphadenopathy:    He has no cervical adenopathy.  Neurological: He is alert and oriented to person, place, and time.  Skin: Skin is warm and dry. He is not diaphoretic.  Psychiatric: He has a normal mood and affect.     ASSESSMENT/ PLAN:  TODAY:   1.  Constipation due to opioid therapy: is stable will continue senna s twice daily and miralax twice daily   2. Pulmonary Emboli: is stable is not a candidate for anticoagulation due to GIB and duodenal ulcer  3. Chronic back pain/chronic pain syndrome/chronically on opiate therapy: pain is not well managed: will continue voltaren gel 2 gm four times daily; cymbalta 60 mg twice daily  lidoderm patch daily oxycodone 10 mg every 4 hours as needed   Will increase neurontin to  600 mg four times daily and will setup a pain clinic consult.   PREVIOUS  4. Normocytic anemia: is stable hgb 9.0; will continue iron daily   5. Duodenal ulcer with acute GI bleeding/liver cancer: is stable will continue protonix 40 mg twice daily   5. Acute urinary retention: he has history of TURP 2 times: his flomax had to be stopped due to orthostatic hypotension. Will monitor   6.  Hypothyroidism due to acquired atrophy of thyroidism: is stable will continue synthroid 50 mcg daily   7. Current every day smoker: is stable is using nicotine patch 14 mg daily   8.  COPD/acute on chronic respiratory failure with hypoxia/lung cancer : is stable is 02 dependent; will continue albuterol 2 puffs every 6 hours as needed; spiriva 18 mcg daily combivent respimet 1 puff every 6 hours.        MD is aware of resident's narcotic use and is in agreement with current plan of care. We will attempt to wean resident as apropriate   Ok Edwards NP [facility]  Contact [phone] Monday through Friday 8am- 5pm  After hours call [phone]

## 2018-06-25 ENCOUNTER — Encounter: Payer: Self-pay | Admitting: Gastroenterology

## 2018-06-25 ENCOUNTER — Ambulatory Visit (INDEPENDENT_AMBULATORY_CARE_PROVIDER_SITE_OTHER): Payer: Medicaid Other | Admitting: Gastroenterology

## 2018-06-25 ENCOUNTER — Other Ambulatory Visit (INDEPENDENT_AMBULATORY_CARE_PROVIDER_SITE_OTHER): Payer: Medicaid Other

## 2018-06-25 VITALS — BP 122/86 | HR 41 | Ht 67.0 in | Wt 148.0 lb

## 2018-06-25 DIAGNOSIS — B1920 Unspecified viral hepatitis C without hepatic coma: Secondary | ICD-10-CM | POA: Diagnosis not present

## 2018-06-25 DIAGNOSIS — K2970 Gastritis, unspecified, without bleeding: Secondary | ICD-10-CM

## 2018-06-25 DIAGNOSIS — K269 Duodenal ulcer, unspecified as acute or chronic, without hemorrhage or perforation: Secondary | ICD-10-CM | POA: Diagnosis not present

## 2018-06-25 MED ORDER — SOD PICOSULFATE-MAG OX-CIT ACD 10-3.5-12 MG-GM -GM/160ML PO SOLN: 1.0000 | ORAL | 0 refills | Status: DC

## 2018-06-25 NOTE — Patient Instructions (Addendum)
If you are age 57 or older, your body mass index should be between 23-30. Your Body mass index is 23.18 kg/m. If this is out of the aforementioned range listed, please consider follow up with your Primary Care Provider.  If you are age 4 or younger, your body mass index should be between 19-25. Your Body mass index is 23.18 kg/m. If this is out of the aformentioned range listed, please consider follow up with your Primary Care Provider.   You have been scheduled for an endoscopy and colonoscopy. Please follow the written instructions given to you at your visit today. Please pick up your prep supplies at the pharmacy within the next 1-3 days. If you use inhalers (even only as needed), please bring them with you on the day of your procedure. Your physician has requested that you go to www.startemmi.com and enter the access code given to you at your visit today. This web site gives a general overview about your procedure. However, you should still follow specific instructions given to you by our office regarding your preparation for the procedure.  We have sent the following medications to your pharmacy for you to pick up at your convenience: Clenpiq  You will receive a call back from [place] regarding your referral to infectious disease.  It was a pleasure to see you today!  Vito Cirigliano, D.O.

## 2018-06-25 NOTE — H&P (View-Only) (Signed)
P  Chief Complaint:    Hospital follow-up, history of duodenal ulcer and gastritis  GI History: 57 year old male with recent admission for upper GI bleed secondary to duodenal ulcer, treated medically without need for endoscopic intervention.  HPI:     Patient is a 57 y.o. male presenting to the Gastroenterology Clinic for follow-up from recent hospital admission.  He was admitted on 05/28/2018 for near syncopal event, abdominal pain, melenic stools with admission evaluation notable for FOBT positive stools and normocytic anemia.  EGD on that admission notable for non-H. pylori gastritis and non-H. pylori duodenitis with a single 4 mm cratered ulcer in the duodenal bulb.  Suspected NSAID induced ulcer due to naproxen use.  He was treated medically with Protonix 40 mg p.o. twice daily for plan of 8 weeks with plan for repeat upper endoscopy in 6 to 8 weeks to evaluate for mucosal healing.  Also given recommendation at that time for colonoscopy for routine colon cancer screening to be scheduled as an outpatient.  Today, he presents from assisted living facilty [facility]) for ongoing PT/OT. He states he "hurts all over, all the time", citing pain in back, hips, legs. Chronic pain, unchanged. No recurrence of melena. Tolerating PO intake since hiospital d/c. No abdominal pain. Weight stable. Not currenlty on any anticoagulation or antiplatelet therapy.   Additionally has a history of hepatitis C, genotype 1a with cirrhosis noted on liver CT in 2016.  No previous hepatitis C treatment.  Recent HCV viral load 2.27 million in August 2019.  No evidence of portal hypertension on recent EGD.  RUQ ultrasound on 05/28/2018 with normal appearing liver and patent portal vein.  At time of hospital discharge, normal BMP and CBC notable for WBC 7, hemoglobin/hematocrit 9/27.5, platelets 458.  Normal ferritin, folate, B12.  HIV negative.  INR was 1.76 (on Xarelto). No labs since hospital d/c for review. No  new imaging studies for review.   He is a difficult historian both today and historically when reviewing available notes in EHR and on recent hospitalization, to include a questionable history of ?lung CA with mets.  Prior review of Oncology notes in 2015 also report no corroborating e/o this and imaging at that time, and on subsequent admissions, w/o e/o pulmonary or hepatic lesions. Additionally, he has a long hx of opioid dependence and admission to [facility] in 2016 with polysubstance abuse (UDS + for cocaine, marijuana, opiates and elevated EtOH) as well as self admission for detox in June 2019 d/t heroin use. Additionally, he had a CT in 2016 at [facility] that remarked on cirrhotic appearing liver, but as above, recent imaging without evidence of cirrhosis.   Endoscopic history: -EGD (05/29/2018, Dr. Bryan Lemma, inpatient at [facility]): Small hiatal hernia otherwise normal esophagus, antral gastritis (mild non-H. pylori gastritis close (, nonbleeding 4 mm cratered ulcer with mild surrounding mucosal erythema in the duodenal bulb (biopsy with duodenitis and ulceration without H. pylori). -Colonoscopy (2010): Poor prep with recommend addition to repeat in 1 year  Review of systems:     No chest pain, no SOB, no fevers, no urinary sx   Past Medical History:  Diagnosis Date  . Anxiety   . Arthritis    "I'm eat up w/it"  . Asthma   . B12 deficiency    "give myself shots"  . C. difficile colitis 2011  . Chronic liver failure ([place])   . Chronic lower back pain   . COPD (chronic obstructive pulmonary disease) ([place])   .  Deep vein thrombosis (HCC)    "several"  . Depression   . GERD (gastroesophageal reflux disease)   . Hepatitis C 2011   genotype 1a.  never treated.    Marland Kitchen History of blood transfusion 2013   "related to kidneys shutting down"  . Hypertension   . Hypothyroidism   . Intermittent self-catheterization of bladder   . Liver cancer ([place])   . Metastatic lung cancer    "left"  . Migraines     "2-3/day" (05/27/2014)  . Myocardial infarction ([place]) 2007; ~ 2011  . Oxygen dependent    3L; 24/7" (05/27/2014)  . Partial small bowel obstruction ([place])   . Pneumonia    "several times"  . Pulmonary emboli (HCC)    "several"  . Shortness of breath    "all the time"  . Stroke [facility]) ~ 03/2012   "couldn't use my left hand for ~ 6 months" (05/27/2014)  . Stroke [facility]) 05/25/2014   "not able to use my left hand again" (05/27/2014)    Patient's surgical history, family medical history, social history, medications and allergies were all reviewed in Epic    Current Outpatient Medications  Medication Sig Dispense Refill  . albuterol (PROVENTIL HFA;VENTOLIN HFA) 108 (90 Base) MCG/ACT inhaler Inhale 2 puffs into the lungs every 6 (six) hours as needed for wheezing or shortness of breath.    . diclofenac sodium (VOLTAREN) 1 % GEL Apply 2 g topically 4 (four) times daily.    . DULoxetine (CYMBALTA) 60 MG capsule Take 1 capsule (60 mg total) by mouth 2 (two) times daily. 15 capsule 0  . ferrous sulfate 325 (65 FE) MG tablet Take 325 mg by mouth daily with breakfast.    . gabapentin (NEURONTIN) 600 MG tablet Take 600 mg by mouth 3 (three) times daily.     . hydrOXYzine (ATARAX/VISTARIL) 25 MG tablet Take 1 tablet (25 mg total) by mouth 3 (three) times daily as needed for anxiety. 20 tablet 0  . Ipratropium-Albuterol (COMBIVENT RESPIMAT) 20-100 MCG/ACT AERS respimat Inhale 1 puff into the lungs every 6 (six) hours.    Marland Kitchen levothyroxine (SYNTHROID, LEVOTHROID) 50 MCG tablet Take 1 tablet (50 mcg total) by mouth daily before breakfast. 7 tablet 0  . lidocaine (LIDODERM) 5 % Place 1 patch onto the skin daily. Remove & Discard patch within 12 hours or as directed by MD 30 patch 0  . Multiple Vitamin (MULTIVITAMIN) tablet Take 1 tablet by mouth daily.    . nicotine (NICODERM CQ - DOSED IN MG/24 HOURS) 14 mg/24hr patch Place 1 patch (14 mg total) onto the skin daily. 28 patch 0  . Nutritional Supplements  (NUTRITIONAL SUPPLEMENT PO) NAS (No Added Salt) diet - Regular texture    . Nutritional Supplements (PROMOD) LIQD Give 30 cc by mouth three times daily    . Oxycodone HCl 10 MG TABS Take 1 tablet (10 mg total) by mouth every 4 (four) hours as needed. 100 tablet 0  . OXYGEN Place 2 L/min into the nose continuous.    . pantoprazole (PROTONIX) 40 MG tablet Take 1 tablet (40 mg total) by mouth 2 (two) times daily. 60 tablet 0  . polyethylene glycol (MIRALAX / GLYCOLAX) packet Take 17 g by mouth 2 (two) times daily. 14 each 0  . senna-docusate (SENOKOT-S) 8.6-50 MG tablet Take 1 tablet by mouth 2 (two) times daily.    Marland Kitchen tiotropium (SPIRIVA) 18 MCG inhalation capsule Place 18 mcg into inhaler and inhale daily.    Marland Kitchen  vitamin B-12 (CYANOCOBALAMIN) 500 MCG tablet Take 500 mcg by mouth daily.      No current facility-administered medications for this visit.     Physical Exam:     Ht 5\' 7"  (1.702 m)   Wt 148 lb (67.1 kg)   BMI 23.18 kg/m   GENERAL:  Pleasant male in NAD PSYCH: : Cooperative, normal affect EENT:  conjunctiva pink, mucous membranes moist, neck supple without masses CARDIAC:  RRR, no murmur heard, no peripheral edema PULM: Normal respiratory effort, lungs CTA bilaterally, no wheezing ABDOMEN:  Nondistended, soft, nontender. No obvious masses, no hepatomegaly,  normal bowel sounds SKIN:  turgor, no lesions seen Musculoskeletal:  Normal muscle tone, normal strength NEURO: Alert and oriented x 3, no focal neurologic deficits   IMPRESSION and PLAN:    #1. Duodenal ulcer: - Resume Protonix 40 mg PO BID for 8 weeks total - Repeat EGD to evaluate for mucosal healing with repeat biopsies as indicated     #2.  CRC screening: Last colonoscopy was 2010 and notable for poor prep with recommendation repeat in 1 year.  No repeat since then. - Colonoscopy and EGD scheduled for 07/15/18 at [facility]  #3.  HCV: - Referral to ID to discuss treatment options - Recheck INR. Otherwise, no  serologic or clinical e/o impaired hepatic synthetic function and RUQ [place] was normal during hospitalization. No e/o portal hypertension on recent EGD. Will again eval for EV, portal HTN gastritis on repeat EGD as above  #4.  Chronic oxygen therapy: We will plan on performing procedures at [facility] due to oxygen requirement.  #5. History of anticoagulation use: Was taking Xarelto on recent hospital admission, which was stopped during hospitalization and decision by hospitalist service was no indication to resume.  Not currently on any anticoagulation.  Defer to [facility] role of anticoagulation/antiplatelet therapy.  If decision to restart/introduce new agent, okay to resume ASA 81 mg through peri-operative.  But otherwise recommend holding NOACs x2 days, Plavix x5 days, ASA 325 mg reduced to 81 mg 5 days prior to procedure.      [place]e ,DO, FACG 06/25/2018, 1:42 PM

## 2018-06-25 NOTE — Progress Notes (Signed)
P  Chief Complaint:    Hospital follow-up, history of duodenal ulcer and gastritis  GI History: 57 year old male with recent admission for upper GI bleed secondary to duodenal ulcer, treated medically without need for endoscopic intervention.  HPI:     Patient is a 57 y.o. male presenting to the Gastroenterology Clinic for follow-up from recent hospital admission.  He was admitted on 05/28/2018 for near syncopal event, abdominal pain, melenic stools with admission evaluation notable for FOBT positive stools and normocytic anemia.  EGD on that admission notable for non-H. pylori gastritis and non-H. pylori duodenitis with a single 4 mm cratered ulcer in the duodenal bulb.  Suspected NSAID induced ulcer due to naproxen use.  He was treated medically with Protonix 40 mg p.o. twice daily for plan of 8 weeks with plan for repeat upper endoscopy in 6 to 8 weeks to evaluate for mucosal healing.  Also given recommendation at that time for colonoscopy for routine colon cancer screening to be scheduled as an outpatient.  Today, he presents from assisted living facilty [facility]) for ongoing PT/OT. He states he "hurts all over, all the time", citing pain in back, hips, legs. Chronic pain, unchanged. No recurrence of melena. Tolerating PO intake since hiospital d/c. No abdominal pain. Weight stable. Not currenlty on any anticoagulation or antiplatelet therapy.   Additionally has a history of hepatitis C, genotype 1a with cirrhosis noted on liver CT in 2016.  No previous hepatitis C treatment.  Recent HCV viral load 2.27 million in August 2019.  No evidence of portal hypertension on recent EGD.  RUQ ultrasound on 05/28/2018 with normal appearing liver and patent portal vein.  At time of hospital discharge, normal BMP and CBC notable for WBC 7, hemoglobin/hematocrit 9/27.5, platelets 458.  Normal ferritin, folate, B12.  HIV negative.  INR was 1.76 (on Xarelto). No labs since hospital d/c for review. No  new imaging studies for review.   He is a difficult historian both today and historically when reviewing available notes in EHR and on recent hospitalization, to include a questionable history of ?lung CA with mets.  Prior review of Oncology notes in 2015 also report no corroborating e/o this and imaging at that time, and on subsequent admissions, w/o e/o pulmonary or hepatic lesions. Additionally, he has a long hx of opioid dependence and admission to [facility] in 2016 with polysubstance abuse (UDS + for cocaine, marijuana, opiates and elevated EtOH) as well as self admission for detox in June 2019 d/t heroin use. Additionally, he had a CT in 2016 at [facility] that remarked on cirrhotic appearing liver, but as above, recent imaging without evidence of cirrhosis.   Endoscopic history: -EGD (05/29/2018, Dr. Bryan Lemma, inpatient at [facility]): Small hiatal hernia otherwise normal esophagus, antral gastritis (mild non-H. pylori gastritis close (, nonbleeding 4 mm cratered ulcer with mild surrounding mucosal erythema in the duodenal bulb (biopsy with duodenitis and ulceration without H. pylori). -Colonoscopy (2010): Poor prep with recommend addition to repeat in 1 year  Review of systems:     No chest pain, no SOB, no fevers, no urinary sx   Past Medical History:  Diagnosis Date  . Anxiety   . Arthritis    "I'm eat up w/it"  . Asthma   . B12 deficiency    "give myself shots"  . C. difficile colitis 2011  . Chronic liver failure ([place])   . Chronic lower back pain   . COPD (chronic obstructive pulmonary disease) ([place])   .  Deep vein thrombosis (HCC)    "several"  . Depression   . GERD (gastroesophageal reflux disease)   . Hepatitis C 2011   genotype 1a.  never treated.    Marland Kitchen History of blood transfusion 2013   "related to kidneys shutting down"  . Hypertension   . Hypothyroidism   . Intermittent self-catheterization of bladder   . Liver cancer ([place])   . Metastatic lung cancer    "left"  . Migraines     "2-3/day" (05/27/2014)  . Myocardial infarction ([place]) 2007; ~ 2011  . Oxygen dependent    3L; 24/7" (05/27/2014)  . Partial small bowel obstruction ([place])   . Pneumonia    "several times"  . Pulmonary emboli (HCC)    "several"  . Shortness of breath    "all the time"  . Stroke [facility]) ~ 03/2012   "couldn't use my left hand for ~ 6 months" (05/27/2014)  . Stroke [facility]) 05/25/2014   "not able to use my left hand again" (05/27/2014)    Patient's surgical history, family medical history, social history, medications and allergies were all reviewed in Epic    Current Outpatient Medications  Medication Sig Dispense Refill  . albuterol (PROVENTIL HFA;VENTOLIN HFA) 108 (90 Base) MCG/ACT inhaler Inhale 2 puffs into the lungs every 6 (six) hours as needed for wheezing or shortness of breath.    . diclofenac sodium (VOLTAREN) 1 % GEL Apply 2 g topically 4 (four) times daily.    . DULoxetine (CYMBALTA) 60 MG capsule Take 1 capsule (60 mg total) by mouth 2 (two) times daily. 15 capsule 0  . ferrous sulfate 325 (65 FE) MG tablet Take 325 mg by mouth daily with breakfast.    . gabapentin (NEURONTIN) 600 MG tablet Take 600 mg by mouth 3 (three) times daily.     . hydrOXYzine (ATARAX/VISTARIL) 25 MG tablet Take 1 tablet (25 mg total) by mouth 3 (three) times daily as needed for anxiety. 20 tablet 0  . Ipratropium-Albuterol (COMBIVENT RESPIMAT) 20-100 MCG/ACT AERS respimat Inhale 1 puff into the lungs every 6 (six) hours.    Marland Kitchen levothyroxine (SYNTHROID, LEVOTHROID) 50 MCG tablet Take 1 tablet (50 mcg total) by mouth daily before breakfast. 7 tablet 0  . lidocaine (LIDODERM) 5 % Place 1 patch onto the skin daily. Remove & Discard patch within 12 hours or as directed by MD 30 patch 0  . Multiple Vitamin (MULTIVITAMIN) tablet Take 1 tablet by mouth daily.    . nicotine (NICODERM CQ - DOSED IN MG/24 HOURS) 14 mg/24hr patch Place 1 patch (14 mg total) onto the skin daily. 28 patch 0  . Nutritional Supplements  (NUTRITIONAL SUPPLEMENT PO) NAS (No Added Salt) diet - Regular texture    . Nutritional Supplements (PROMOD) LIQD Give 30 cc by mouth three times daily    . Oxycodone HCl 10 MG TABS Take 1 tablet (10 mg total) by mouth every 4 (four) hours as needed. 100 tablet 0  . OXYGEN Place 2 L/min into the nose continuous.    . pantoprazole (PROTONIX) 40 MG tablet Take 1 tablet (40 mg total) by mouth 2 (two) times daily. 60 tablet 0  . polyethylene glycol (MIRALAX / GLYCOLAX) packet Take 17 g by mouth 2 (two) times daily. 14 each 0  . senna-docusate (SENOKOT-S) 8.6-50 MG tablet Take 1 tablet by mouth 2 (two) times daily.    Marland Kitchen tiotropium (SPIRIVA) 18 MCG inhalation capsule Place 18 mcg into inhaler and inhale daily.    Marland Kitchen  vitamin B-12 (CYANOCOBALAMIN) 500 MCG tablet Take 500 mcg by mouth daily.      No current facility-administered medications for this visit.     Physical Exam:     Ht 5\' 7"  (1.702 m)   Wt 148 lb (67.1 kg)   BMI 23.18 kg/m   GENERAL:  Pleasant male in NAD PSYCH: : Cooperative, normal affect EENT:  conjunctiva pink, mucous membranes moist, neck supple without masses CARDIAC:  RRR, no murmur heard, no peripheral edema PULM: Normal respiratory effort, lungs CTA bilaterally, no wheezing ABDOMEN:  Nondistended, soft, nontender. No obvious masses, no hepatomegaly,  normal bowel sounds SKIN:  turgor, no lesions seen Musculoskeletal:  Normal muscle tone, normal strength NEURO: Alert and oriented x 3, no focal neurologic deficits   IMPRESSION and PLAN:    #1. Duodenal ulcer: - Resume Protonix 40 mg PO BID for 8 weeks total - Repeat EGD to evaluate for mucosal healing with repeat biopsies as indicated     #2.  CRC screening: Last colonoscopy was 2010 and notable for poor prep with recommendation repeat in 1 year.  No repeat since then. - Colonoscopy and EGD scheduled for 07/15/18 at [facility]  #3.  HCV: - Referral to ID to discuss treatment options - Recheck INR. Otherwise, no  serologic or clinical e/o impaired hepatic synthetic function and RUQ [place] was normal during hospitalization. No e/o portal hypertension on recent EGD. Will again eval for EV, portal HTN gastritis on repeat EGD as above  #4.  Chronic oxygen therapy: We will plan on performing procedures at [facility] due to oxygen requirement.  #5. History of anticoagulation use: Was taking Xarelto on recent hospital admission, which was stopped during hospitalization and decision by hospitalist service was no indication to resume.  Not currently on any anticoagulation.  Defer to [facility] role of anticoagulation/antiplatelet therapy.  If decision to restart/introduce new agent, okay to resume ASA 81 mg through peri-operative.  But otherwise recommend holding NOACs x2 days, Plavix x5 days, ASA 325 mg reduced to 81 mg 5 days prior to procedure.      [place] ,DO, FACG 06/25/2018, 1:42 PM

## 2018-06-26 LAB — PROTIME-INR
INR: 1.1 ratio — ABNORMAL HIGH (ref 0.8–1.0)
Prothrombin Time: 12.8 s (ref 9.6–13.1)

## 2018-07-01 ENCOUNTER — Other Ambulatory Visit: Payer: Self-pay

## 2018-07-01 ENCOUNTER — Non-Acute Institutional Stay (SKILLED_NURSING_FACILITY): Payer: Medicaid Other | Admitting: Adult Health

## 2018-07-01 ENCOUNTER — Encounter: Payer: Self-pay | Admitting: Adult Health

## 2018-07-01 DIAGNOSIS — R338 Other retention of urine: Secondary | ICD-10-CM

## 2018-07-01 DIAGNOSIS — D649 Anemia, unspecified: Secondary | ICD-10-CM | POA: Diagnosis not present

## 2018-07-01 DIAGNOSIS — K269 Duodenal ulcer, unspecified as acute or chronic, without hemorrhage or perforation: Secondary | ICD-10-CM

## 2018-07-01 DIAGNOSIS — E034 Atrophy of thyroid (acquired): Secondary | ICD-10-CM

## 2018-07-01 MED ORDER — OXYCODONE HCL 10 MG PO TABS: 10.0000 mg | ORAL_TABLET | ORAL | 0 refills | Status: DC | PRN

## 2018-07-01 NOTE — Progress Notes (Signed)
Location:   [facility] Room Number: [place] of Service:  SNF (31)   CODE STATUS: Full Code  Allergies  Allergen Reactions  . Ciprofloxacin Hives  . Penicillins Itching    Has patient had a PCN reaction causing immediate rash, facial/tongue/throat swelling, SOB or lightheadedness with hypotension: YES Has patient had a PCN reaction causing severe rash involving mucus membranes or skin necrosis: NO Has patient had a PCN reaction that required hospitalization NO Has patient had a PCN reaction occurring within the last 10 years: NO If all of the above answers are "NO", then may proceed with Cephalosporin use.  . Adhesive  [Tape] Rash  . Morphine And Related Itching and Rash    Chief Complaint  Patient presents with  . Medical Management of Chronic Issues    Duodenal ulcer; hypothyroidism; urine retention; anemia. Weekly follow up for the first 30 days post hospitalization     HPI:  He is a 57 year old short term rehab patient being seen for the management of his chronic illnesses: duodenal ulceration; hypothyroidism; urine retention; anemia. He denies any difficulty urinating; no chest pain; has chronic back and hip pain. Awaiting pain clinic consult.   Past Medical History:  Diagnosis Date  . Abnormal posture   . Acute and chronic respiratory failure with hypoxia ([place])   . Anxiety   . Arthritis    "I'm eat up w/it"  . Asthma   . B12 deficiency    "give myself shots"  . C. difficile colitis 2011  . Cerebral infarction ([place])   . Chronic liver failure ([place])   . Chronic lower back pain   . Chronic pain syndrome   . Chronic viral hepatitis C ([place])   . Cirrhosis ([place])   . COPD (chronic obstructive pulmonary disease) ([place])   . Deep vein thrombosis (HCC)    "several"  . Depression   . Gastrointestinal hemorrhage    unspecified   . GERD (gastroesophageal reflux disease)   . Hepatitis C 2011   genotype 1a.  never treated.    Marland Kitchen History of blood  transfusion 2013   "related to kidneys shutting down"  . Hypertension   . Hypothyroidism   . Intermittent self-catheterization of bladder   . Liver cancer ([place])   . Metastatic lung cancer    "left"  . Migraines    "2-3/day" (05/27/2014)  . Muscle weakness   . Myocardial infarction ([place]) 2007; ~ 2011  . Other abnormalities of gait and mobility   . Oxygen dependent    3L; 24/7" (05/27/2014)  . Partial small bowel obstruction ([place])   . Personal history of transient ischemic attack (TIA), and cerebral infarction without residual deficits   . Pneumonia    "several times"  . Pulmonary emboli (HCC)    "several"  . Shortness of breath    "all the time"  . Stroke [facility]) ~ 03/2012   "couldn't use my left hand for ~ 6 months" (05/27/2014)  . Stroke [facility]) 05/25/2014   "not able to use my left hand again" (05/27/2014)  . Unspecified asthma with (acute) exacerbation     Past Surgical History:  Procedure Laterality Date  . BIOPSY  05/29/2018   Procedure: BIOPSY;  Surgeon: Lavena Bullion, DO;  Location: [place] [facility];  Service: Gastroenterology;;  . ESOPHAGOGASTRODUODENOSCOPY (EGD) WITH PROPOFOL N/A 05/29/2018   Procedure: ESOPHAGOGASTRODUODENOSCOPY (EGD) WITH PROPOFOL;  Surgeon: Lavena Bullion, DO;  Location: [place];  Service: Gastroenterology;  Laterality: N/A;  .  INCISION AND DRAINAGE ABSCESS Left 05/28/2014   Procedure: INCISION AND DRAINAGE ABSCESS LEFT WRIST;  Surgeon: Leanora Cover, MD;  Location: [place];  Service: Orthopedics;  Laterality: Left;  . KNEE SURGERY Left ~ 1972   "cut top of my kneecap off"  . MULTIPLE TOOTH EXTRACTIONS  11/2010   "26"  . NECK SURGERY     x4   . TONSILLECTOMY AND ADENOIDECTOMY  1974  . TOTAL HIP ARTHROPLASTY Right 03/2018  . TRANSURETHRAL RESECTION OF PROSTATE  2012; 2014    Social History   Socioeconomic History  . Marital status: Married    Spouse name: Not on file  . Number of children: 3  . Years of education: Not on file  . Highest  education level: Not on file  Occupational History  . Not on file  Social Needs  . Financial resource strain: Not on file  . Food insecurity:    Worry: Not on file    Inability: Not on file  . Transportation needs:    Medical: Not on file    Non-medical: Not on file  Tobacco Use  . Smoking status: Former Smoker    Packs/day: 0.50    Years: 41.00    Pack years: 20.50    Types: Cigarettes  . Smokeless tobacco: Former Systems developer  . Tobacco comment: quit in February 2019  Substance and Sexual Activity  . Alcohol use: Yes    Comment: 8 drinks a week  . Drug use: No  . Sexual activity: Not on file  Lifestyle  . Physical activity:    Days per week: Not on file    Minutes per session: Not on file  . Stress: Not on file  Relationships  . Social connections:    Talks on phone: Not on file    Gets together: Not on file    Attends religious service: Not on file    Active member of club or organization: Not on file    Attends meetings of clubs or organizations: Not on file    Relationship status: Not on file  . Intimate partner violence:    Fear of current or ex partner: Not on file    Emotionally abused: Not on file    Physically abused: Not on file    Forced sexual activity: Not on file  Other Topics Concern  . Not on file  Social History Narrative  . Not on file   Family History  Problem Relation Age of Onset  . Breast cancer Mother   . Cancer Paternal Aunt   . Cancer Maternal Aunt        think it is breast      VITAL SIGNS BP 124/88   Pulse 74   Temp 97.8 F (36.6 C)   Resp 18   Ht '5\' 7"'  (1.702 m)   Wt 147 lb (66.7 kg)   SpO2 96%   BMI 23.02 kg/m   Outpatient Encounter Medications as of 07/01/2018  Medication Sig  . albuterol (PROVENTIL HFA;VENTOLIN HFA) 108 (90 Base) MCG/ACT inhaler Inhale 2 puffs into the lungs every 6 (six) hours as needed for wheezing or shortness of breath.  . diclofenac sodium (VOLTAREN) 1 % GEL Apply 2 g topically 4 (four) times daily.    . DULoxetine (CYMBALTA) 60 MG capsule Take 1 capsule (60 mg total) by mouth 2 (two) times daily.  . ferrous sulfate 325 (65 FE) MG tablet Take 325 mg by mouth daily with breakfast.  . gabapentin (NEURONTIN) 600  MG tablet Take 600 mg by mouth 4 (four) times daily.   . Ipratropium-Albuterol (COMBIVENT RESPIMAT) 20-100 MCG/ACT AERS respimat Inhale 1 puff into the lungs every 6 (six) hours.  Marland Kitchen levothyroxine (SYNTHROID, LEVOTHROID) 50 MCG tablet Take 1 tablet (50 mcg total) by mouth daily before breakfast.  . lidocaine (LIDODERM) 5 % Place 1 patch onto the skin daily. Remove & Discard patch within 12 hours or as directed by MD  . Multiple Vitamin (MULTIVITAMIN) tablet Take 1 tablet by mouth daily.  . nicotine (NICODERM CQ - DOSED IN MG/24 HOURS) 14 mg/24hr patch Place 1 patch (14 mg total) onto the skin daily.  . Nutritional Supplements (NUTRITIONAL SUPPLEMENT PO) NAS (No Added Salt) diet - Regular texture  . Nutritional Supplements (PROMOD) LIQD Give 30 cc by mouth three times daily  . Oxycodone HCl 10 MG TABS Take 1 tablet (10 mg total) by mouth every 4 (four) hours as needed.  . OXYGEN Place 2 L/min into the nose continuous.  . pantoprazole (PROTONIX) 40 MG tablet Take 1 tablet (40 mg total) by mouth 2 (two) times daily.  . polyethylene glycol (MIRALAX / GLYCOLAX) packet Take 17 g by mouth 2 (two) times daily.  Marland Kitchen senna-docusate (SENOKOT-S) 8.6-50 MG tablet Take 1 tablet by mouth 2 (two) times daily.  . Sod Picosulfate-Mag Ox-Cit Acd (CLENPIQ) 10-3.5-12 MG-GM -GM/160ML SOLN Take 1 kit by mouth as directed.  . tiotropium (SPIRIVA) 18 MCG inhalation capsule Place 18 mcg into inhaler and inhale daily.  . vitamin B-12 (CYANOCOBALAMIN) 500 MCG tablet Take 500 mcg by mouth daily.   . [DISCONTINUED] hydrOXYzine (ATARAX/VISTARIL) 25 MG tablet Take 1 tablet (25 mg total) by mouth 3 (three) times daily as needed for anxiety. (Patient not taking: Reported on 07/01/2018)   No facility-administered encounter  medications on file as of 07/01/2018.      SIGNIFICANT DIAGNOSTIC EXAMS  PREVIOUS:   05-28-18: abdominal ultrasound: Unremarkable right upper quadrant ultrasound.  05-29-18: 2-D ECHO:  - Left ventricle: The cavity size was normal. There was mild concentric hypertrophy. Systolic function was normal. The estimated ejection fraction was in the range of 60% to 65%. Wall motion was normal; there were no regional wall motion abnormalities. Doppler parameters are consistent with abnormal left ventricular relaxation (grade 1 diastolic dysfunction). There was no evidence of elevated ventricular filling pressure by Doppler parameters. - Aortic valve: There was no regurgitation. - Mitral valve: There was trivial regurgitation. - Left atrium: The atrium was normal in size. - Right ventricle: The cavity size was moderately dilated. Wall thickness was normal. - Right atrium: The atrium was normal in size. - Pulmonary arteries: Systolic pressure was within the normal range. - Inferior vena cava: The vessel was normal in size. - Pericardium, extracardiac: There was no pericardial effusion.  05-31-18: chest x-ray:  Interval development of patchy consolidative opacities within the right mid and lower lung and left lung base concerning for pneumonia in the appropriate clinical setting. Followup PA and lateral chest X-ray is recommended in 3-4 weeks following trial of antibiotic therapy to ensure resolution and exclude underlying malignancy.  NO NEW EXAM    LABS REVIEWED PREVIOUS:  05-29-18: wbc 6.7; hgb 8.7; hc6 27.9; mcv 91.;5 plt 487; glucose 89; bun 28; creat 0.99; k+ 4.5; na++ 141; ca 9.0 occult blood: + urine drug screen is neg; vit B 12: 223; iron 242; tibc 270; ferritin 61 folate 22.9 HIV nr  05-31-18: wbc 15.6; hgb 10.2; hct 30.2; mcv 88.3; plt 490; glucose 94; bun  16; creat 1.08; k+ 4.5; na++ 136; liver normal albumin 3.2 blood and urine culture: no growth  06-04-18: wbc 6.0; hgb 9.3; hct 28.0; mcv  87.5 pt 483 glucose 93; bun 9; creat 0.83; k+ 3.6; na++ 142; ca 8.9  06-07-18: wbc 7.0; hgb 9.0; hct 27.5; mcv 87.9; plt 458 glucose 94; bun 11; creat 0.96; k+ 4.0; na++ 141; ca 8.5   NO NEW LABS.    Review of Systems  Constitutional: Negative for malaise/fatigue.  Respiratory: Negative for cough and shortness of breath.   Cardiovascular: Negative for chest pain, palpitations and leg swelling.  Gastrointestinal: Negative for abdominal pain, constipation and heartburn.  Musculoskeletal: Positive for back pain and joint pain. Negative for myalgias.       Has chronic back and hip pain   Skin: Negative.   Neurological: Negative for dizziness.  Psychiatric/Behavioral: The patient is not nervous/anxious.    .   Physical Exam  Constitutional: He is oriented to person, place, and time. No distress.  Frail   Neck: No thyromegaly present.  Cardiovascular: Normal rate, regular rhythm, normal heart sounds and intact distal pulses.  Pulmonary/Chest: Effort normal. No respiratory distress.  Breath sounds diminished 02 dependent   Abdominal: Soft. Bowel sounds are normal. He exhibits no distension. There is no tenderness.  Musculoskeletal: Normal range of motion. He exhibits no edema.  Lymphadenopathy:    He has no cervical adenopathy.  Neurological: He is alert and oriented to person, place, and time.  Skin: Skin is warm and dry. He is not diaphoretic.  Psychiatric: He has a normal mood and affect.     ASSESSMENT/ PLAN:  TODAY:   1. Normocytic anemia: is stable hgb 9.0; will continue iron daily   2. Duodenal ulcer with acute GI bleeding/liver cancer: is stable will continue protonix 40 mg twice daily   3. Acute urinary retention: he has history of TURP 2 times: his flomax had to be stopped due to orthostatic hypotension. Will monitor   4.  Hypothyroidism due to acquired atrophy of thyroidism: is stable will continue synthroid 50 mcg daily   PREVIOUS  5. Current every day smoker: is  stable is using nicotine patch 14 mg daily   6.  COPD/acute on chronic respiratory failure with hypoxia/lung cancer : is stable is 02 dependent; will continue albuterol 2 puffs every 6 hours as needed; spiriva 18 mcg daily combivent respimet 1 puff every 6 hours.   7.  Constipation due to opioid therapy: is stable will continue senna s twice daily and miralax twice daily   8. Pulmonary Emboli: is stable is not a candidate for anticoagulation due to GIB and duodenal ulcer  9. Chronic back pain/chronic pain syndrome/chronically on opiate therapy: pain is not well managed: will continue voltaren gel 2 gm four times daily; cymbalta 60 mg twice daily  lidoderm patch daily oxycodone 10 mg every 4 hours as needed neurontin to 600 mg four times daily awaiting pain clinic consult     MD is aware of resident's narcotic use and is in agreement with current plan of care. We will attempt to wean resident as apropriate   Ok Edwards NP [facility]  Contact [phone] Monday through Friday 8am- 5pm  After hours call [phone]

## 2018-07-01 NOTE — Telephone Encounter (Signed)
Rx faxed to [facility] (P) [phone], (F) [phone] 

## 2018-07-15 ENCOUNTER — Ambulatory Visit (HOSPITAL_COMMUNITY)
Admission: RE | Admit: 2018-07-15 | Discharge: 2018-07-15 | Disposition: A | Discharge status: Home / Self Care | Payer: Medicaid Other | Source: Ambulatory Visit | Attending: Gastroenterology | Admitting: Gastroenterology

## 2018-07-15 ENCOUNTER — Ambulatory Visit (HOSPITAL_COMMUNITY): Payer: Medicaid Other | Admitting: Anesthesiology

## 2018-07-15 ENCOUNTER — Other Ambulatory Visit: Payer: Self-pay

## 2018-07-15 ENCOUNTER — Encounter (HOSPITAL_COMMUNITY)
Admission: RE | Disposition: A | Discharge status: Home / Self Care | Payer: Self-pay | Source: Ambulatory Visit | Attending: Gastroenterology

## 2018-07-15 ENCOUNTER — Encounter (HOSPITAL_COMMUNITY): Payer: Self-pay | Admitting: *Deleted

## 2018-07-15 DIAGNOSIS — J449 Chronic obstructive pulmonary disease, unspecified: Secondary | ICD-10-CM | POA: Diagnosis not present

## 2018-07-15 DIAGNOSIS — K269 Duodenal ulcer, unspecified as acute or chronic, without hemorrhage or perforation: Secondary | ICD-10-CM | POA: Diagnosis not present

## 2018-07-15 DIAGNOSIS — K219 Gastro-esophageal reflux disease without esophagitis: Secondary | ICD-10-CM | POA: Diagnosis not present

## 2018-07-15 DIAGNOSIS — I10 Essential (primary) hypertension: Secondary | ICD-10-CM | POA: Diagnosis not present

## 2018-07-15 DIAGNOSIS — D125 Benign neoplasm of sigmoid colon: Secondary | ICD-10-CM | POA: Diagnosis not present

## 2018-07-15 DIAGNOSIS — F1120 Opioid dependence, uncomplicated: Secondary | ICD-10-CM | POA: Diagnosis not present

## 2018-07-15 DIAGNOSIS — Z86718 Personal history of other venous thrombosis and embolism: Secondary | ICD-10-CM | POA: Insufficient documentation

## 2018-07-15 DIAGNOSIS — K635 Polyp of colon: Secondary | ICD-10-CM

## 2018-07-15 DIAGNOSIS — Z9981 Dependence on supplemental oxygen: Secondary | ICD-10-CM | POA: Diagnosis not present

## 2018-07-15 DIAGNOSIS — K7460 Unspecified cirrhosis of liver: Secondary | ICD-10-CM | POA: Diagnosis not present

## 2018-07-15 DIAGNOSIS — G8929 Other chronic pain: Secondary | ICD-10-CM | POA: Insufficient documentation

## 2018-07-15 DIAGNOSIS — I252 Old myocardial infarction: Secondary | ICD-10-CM | POA: Insufficient documentation

## 2018-07-15 DIAGNOSIS — Z8673 Personal history of transient ischemic attack (TIA), and cerebral infarction without residual deficits: Secondary | ICD-10-CM | POA: Insufficient documentation

## 2018-07-15 DIAGNOSIS — Z1211 Encounter for screening for malignant neoplasm of colon: Secondary | ICD-10-CM

## 2018-07-15 DIAGNOSIS — Z7901 Long term (current) use of anticoagulants: Secondary | ICD-10-CM | POA: Diagnosis not present

## 2018-07-15 DIAGNOSIS — B1920 Unspecified viral hepatitis C without hepatic coma: Secondary | ICD-10-CM | POA: Diagnosis not present

## 2018-07-15 DIAGNOSIS — Z86711 Personal history of pulmonary embolism: Secondary | ICD-10-CM | POA: Insufficient documentation

## 2018-07-15 DIAGNOSIS — F329 Major depressive disorder, single episode, unspecified: Secondary | ICD-10-CM | POA: Diagnosis not present

## 2018-07-15 DIAGNOSIS — M1990 Unspecified osteoarthritis, unspecified site: Secondary | ICD-10-CM | POA: Diagnosis not present

## 2018-07-15 DIAGNOSIS — E039 Hypothyroidism, unspecified: Secondary | ICD-10-CM | POA: Insufficient documentation

## 2018-07-15 DIAGNOSIS — K2980 Duodenitis without bleeding: Secondary | ICD-10-CM | POA: Diagnosis not present

## 2018-07-15 DIAGNOSIS — F419 Anxiety disorder, unspecified: Secondary | ICD-10-CM | POA: Insufficient documentation

## 2018-07-15 HISTORY — PX: COLONOSCOPY WITH PROPOFOL: SHX5780

## 2018-07-15 HISTORY — PX: ESOPHAGOGASTRODUODENOSCOPY (EGD) WITH PROPOFOL: SHX5813

## 2018-07-15 HISTORY — PX: POLYPECTOMY: SHX5525

## 2018-07-15 SURGERY — ESOPHAGOGASTRODUODENOSCOPY (EGD) WITH PROPOFOL
Anesthesia: Monitor Anesthesia Care

## 2018-07-15 MED ORDER — SODIUM CHLORIDE 0.9 % IV SOLN: INTRAVENOUS | Status: DC

## 2018-07-15 MED ORDER — LACTATED RINGERS IV SOLN
INTRAVENOUS | Status: DC
  Administered 2018-07-15: 1000 mL via INTRAVENOUS

## 2018-07-15 MED ORDER — PROPOFOL 10 MG/ML IV BOLUS
INTRAVENOUS | Status: DC | PRN
  Administered 2018-07-15: 30 mg via INTRAVENOUS

## 2018-07-15 MED ORDER — LIDOCAINE 2% (20 MG/ML) 5 ML SYRINGE
INTRAMUSCULAR | Status: DC | PRN
  Administered 2018-07-15: 60 mg via INTRAVENOUS

## 2018-07-15 MED ORDER — PROPOFOL 500 MG/50ML IV EMUL
INTRAVENOUS | Status: DC | PRN
  Administered 2018-07-15: 100 ug/kg/min via INTRAVENOUS

## 2018-07-15 MED ORDER — PROPOFOL 10 MG/ML IV BOLUS
INTRAVENOUS | Status: AC
  Filled 2018-07-15: qty 40

## 2018-07-15 SURGICAL SUPPLY — 24 items

## 2018-07-15 NOTE — Anesthesia Postprocedure Evaluation (Signed)
Anesthesia Post Note  Patient: Darran Gabay  Procedure(s) Performed: ESOPHAGOGASTRODUODENOSCOPY (EGD) WITH PROPOFOL (N/A ) COLONOSCOPY WITH PROPOFOL (N/A ) POLYPECTOMY     Patient location during evaluation: Endoscopy Anesthesia Type: MAC Level of consciousness: awake and alert Pain management: pain level controlled Vital Signs Assessment: post-procedure vital signs reviewed and stable Respiratory status: spontaneous breathing, nonlabored ventilation, respiratory function stable and patient connected to nasal cannula oxygen Cardiovascular status: blood pressure returned to baseline and stable Postop Assessment: no apparent nausea or vomiting Anesthetic complications: no    Last Vitals:  Vitals:   07/15/18 1140 07/15/18 1150  BP: (!) 145/74 (!) 152/81  Pulse:  76  Resp: 12 20  Temp:    SpO2: 100% 93%    Last Pain:  Vitals:   07/15/18 1150  TempSrc:   PainSc: 0-No pain                 Rayyan Orsborn L Chabely Norby

## 2018-07-15 NOTE — Interval H&P Note (Signed)
History and Physical Interval Note:  07/15/2018 10:39 AM  Robert [name]  has presented today for surgery, with the diagnosis of asses ulcer healing, colorectal cancer screening  The various methods of treatment have been discussed with the patient and family. After consideration of risks, benefits and other options for treatment, the patient has consented to  Procedure(s): COLONOSCOPY WITH PROPOFOL (N/A) ESOPHAGOGASTRODUODENOSCOPY (EGD) WITH PROPOFOL (N/A) as a surgical intervention .  The patient's history has been reviewed, patient examined, no change in status, stable for surgery.  I have reviewed the patient's chart and labs.  Questions were answered to the patient's satisfaction.     Dominic Pea Rosaline Ezekiel

## 2018-07-15 NOTE — Op Note (Signed)
[facility] Patient Name: Robert [name] Procedure Date: 07/15/2018 MRN: [id number] Attending MD: Gerrit Heck , MD Date of Birth: 09-27-1961 CSN: [id number] Age: 57 Admit Type: Outpatient Procedure:                Colonoscopy Indications:              Screening for colorectal malignant neoplasm,                            Screening for colorectal malignant neoplasm,                            inadequate bowel prep on last colonoscopy (more                            recent than 10 years ago) Providers:                Gerrit Heck, MD, Vista Lawman, RN, Cletis Athens,                            Technician Referring MD:              Medicines:                Monitored Anesthesia Care Complications:            No immediate complications. Estimated Blood Loss:     Estimated blood loss was minimal. Procedure:                Pre-Anesthesia Assessment:                           - Prior to the procedure, a History and Physical                            was performed, and patient medications and                            allergies were reviewed. The patient's tolerance of                            previous anesthesia was also reviewed. The risks                            and benefits of the procedure and the sedation                            options and risks were discussed with the patient.                            All questions were answered, and informed consent                            was obtained. Prior Anticoagulants: The patient has                            taken no previous anticoagulant  or antiplatelet                            agents. ASA Grade Assessment: III - A patient with                            severe systemic disease. After reviewing the risks                            and benefits, the patient was deemed in                            satisfactory condition to undergo the procedure.                           After obtaining informed  consent, the colonoscope                            was passed under direct vision. Throughout the                            procedure, the patient's blood pressure, pulse, and                            oxygen saturations were monitored continuously. The                            CF-HQ190L ([id number]) Olympus adult colonoscope was                            introduced through the anus and advanced to the the                            cecum, identified by appendiceal orifice and                            ileocecal valve. The colonoscopy was technically                            difficult and complex due to poor bowel prep. The                            patient tolerated the procedure well. The quality                            of the bowel preparation was poor. Scope In: 11:10:50 AM Scope Out: 11:23:06 AM Scope Withdrawal Time: 0 hours 7 minutes 4 seconds  Total Procedure Duration: 0 hours 12 minutes 16 seconds  Findings:      The perianal and digital rectal examinations were normal.      Two sessile polyps were found in the sigmoid colon. The polyps were 4 to       5 mm in size. These polyps were removed with a cold snare. Resection and       retrieval were  complete. Estimated blood loss was minimal.      A large amount of solid stool and solid food debris was found in the       entire colon, interfering with visualization. Lavage of the area was       performed using copious amounts of sterile water, resulting in       incomplete clearance with fair visualization. The visualized mucosa was       otherwise normal appearing.      The retroflexed view of the distal rectum and anal verge was normal and       showed no anal or rectal abnormalities. Impression:               - Preparation of the colon was poor.                           - Two 4 to 5 mm polyps in the sigmoid colon,                            removed with a cold snare. Resected and retrieved.                           -  Stool in the entire examined colon.                           - The distal rectum and anal verge are normal on                            retroflexion view. Moderate Sedation:      N/A- Per Anesthesia Care Recommendation:           - Patient has a contact number available for                            emergencies. The signs and symptoms of potential                            delayed complications were discussed with the                            patient. Return to normal activities tomorrow.                            Written discharge instructions were provided to the                            patient.                           - Resume previous diet today.                           - Continue present medications.                           - Await pathology results.                           -  Repeat colonoscopy in 1 year because the bowel                            preparation was poor and for surveillance.                           - Return to GI office PRN. Procedure Code(s):        --- Professional ---                           [id number], Colonoscopy, flexible; with removal of                            tumor(s), polyp(s), or other lesion(s) by snare                            technique Diagnosis Code(s):        --- Professional ---                           D12.5, Benign neoplasm of sigmoid colon                           Z12.11, Encounter for screening for malignant                            neoplasm of colon CPT copyright 2018 [employer]. All rights reserved. The codes documented in this report are preliminary and upon coder review may  be revised to meet current compliance requirements. Gerrit Heck, MD 07/15/2018 11:36:23 AM Number of Addenda: 0

## 2018-07-15 NOTE — Op Note (Signed)
[facility] Patient Name: Robert [name] Procedure Date: 07/15/2018 MRN: [id number] Attending MD: Gerrit Heck , MD Date of Birth: 1960/10/06 CSN: [id number] Age: 57 Admit Type: Outpatient Procedure:                Upper GI endoscopy Indications:              Follow-up of duodenal ulcer, Follow-up of duodenitis                           Admitted in August 2019 for upper GI bleed                            secondary to duodenal ulcer, management with high                            dose acid suppression therapy without recurrence of                            bleeding. Biopsies were without H pylori. He                            presents today for repeat EGD to assess for                            appropriate mucosal healing. Providers:                Gerrit Heck, MD, Vista Lawman, RN, Cletis Athens,                            Technician Referring MD:              Medicines:                Monitored Anesthesia Care Complications:            No immediate complications. Estimated Blood Loss:     Estimated blood loss: none. Procedure:                Pre-Anesthesia Assessment:                           - Prior to the procedure, a History and Physical                            was performed, and patient medications and                            allergies were reviewed. The patient's tolerance of                            previous anesthesia was also reviewed. The risks                            and benefits of the procedure and the sedation                            options  and risks were discussed with the patient.                            All questions were answered, and informed consent                            was obtained. Prior Anticoagulants: The patient has                            taken no previous anticoagulant or antiplatelet                            agents. ASA Grade Assessment: III - A patient with                            severe systemic  disease. After reviewing the risks                            and benefits, the patient was deemed in                            satisfactory condition to undergo the procedure.                           After obtaining informed consent, the endoscope was                            passed under direct vision. Throughout the                            procedure, the patient's blood pressure, pulse, and                            oxygen saturations were monitored continuously. The                            [id number]-H190 ([id number] Olympus adult endoscope was                            introduced through the mouth, and advanced to the                            second part of duodenum. The upper GI endoscopy was                            accomplished without difficulty. The patient                            tolerated the procedure well. Scope In: Scope Out: Findings:      The examined esophagus was normal.      A large amount of food (residue) was found in the gastric fundus and in       the gastric body.      The visualized mucosa was otherwise normal  appearing throughout the       stomach.      The duodenal bulb, first portion of the duodenum and second portion of       the duodenum were normal. The previously noted duodenal ulcer has since       healed. Impression:               - Normal esophagus.                           - A large amount of food (residue) in the stomach.                           - Normal mucosa was found in the entire stomach.                           - Normal duodenal bulb, first portion of the                            duodenum and second portion of the duodenum.                           - No specimens collected. Moderate Sedation:      N/A- Per Anesthesia Care Recommendation:           - Patient has a contact number available for                            emergencies. The signs and symptoms of potential                            delayed complications were  discussed with the                            patient. Return to normal activities tomorrow.                            Written discharge instructions were provided to the                            patient.                           - Resume previous diet today.                           - Continue present medications.                           - Reduce Protonix (pantoprazole) to 40 mg PO daily                            for 7 days, then can titrate to the lowest                            effective dose for control of reflux. If planning  on restarting any anticoagulation or antiplatelet                            therapy, would recommend at least a low dose PPI                            for gastric prophylaxis. Otherwise, if not starting                            those medications, and without reflux symptoms or                            dyspepsia, and no sign of recurrence of bleeding,                            can titrate off acid suppression therapy altogether.                           - Return to GI clinic PRN. Procedure Code(s):        --- Professional ---                           [id number], Esophagogastroduodenoscopy, flexible,                            transoral; diagnostic, including collection of                            specimen(s) by brushing or washing, when performed                            (separate procedure) Diagnosis Code(s):        --- Professional ---                           K26.9, Duodenal ulcer, unspecified as acute or                            chronic, without hemorrhage or perforation                           K29.80, Duodenitis without bleeding CPT copyright 2018 American [employer] Association. All rights reserved. The codes documented in this report are preliminary and upon coder review may  be revised to meet current compliance requirements. Gerrit Heck, MD 07/15/2018 11:32:28 AM Number of Addenda: 0

## 2018-07-15 NOTE — Anesthesia Preprocedure Evaluation (Addendum)
Anesthesia Evaluation  Patient identified by MRN, date of birth, ID band Patient awake    Reviewed: Allergy & Precautions, NPO status , Patient's Chart, lab work & pertinent test results  Airway Mallampati: III  TM Distance: >3 FB Neck ROM: Full  Mouth opening: Limited Mouth Opening  Dental no notable dental hx. (+) Edentulous Lower, Edentulous Upper   Pulmonary asthma , COPD (2-3L oxygen),  COPD inhaler and oxygen dependent, former smoker,    Pulmonary exam normal breath sounds clear to auscultation       Cardiovascular hypertension, + Past MI and + Peripheral Vascular Disease  Normal cardiovascular exam Rhythm:Regular Rate:Normal  TTE 05/2018 - Left ventricle: The cavity size was normal. There was mild concentric hypertrophy. Systolic function was normal. The estimated ejection fraction was in the range of 60% to 65%. Wall motion was normal; there were no regional wall motion abnormalities. Doppler parameters are consistent with abnormal left ventricular relaxation (grade 1 diastolic dysfunction). - Aortic valve: There was no regurgitation. - Mitral valve: There was trivial regurgitation. - Left atrium: The atrium was normal in size. - Right ventricle: The cavity size was moderately dilated. Wall  thickness was normal.     Neuro/Psych  Headaches, Anxiety Depression CVA (left hand weakness), Residual Symptoms negative psych ROS   GI/Hepatic GERD  ,(+) Cirrhosis       , Hepatitis -, C  Endo/Other  Hypothyroidism   Renal/GU negative Renal ROS  negative genitourinary   Musculoskeletal  (+) Arthritis ,   Abdominal   Peds  Hematology negative hematology ROS (+)   Anesthesia Other Findings Ulcer, CRC screening  Reproductive/Obstetrics                            Anesthesia Physical Anesthesia Plan  ASA: III  Anesthesia Plan: MAC   Post-op Pain Management:    Induction: Intravenous  PONV  Risk Score and Plan: 2 and Treatment may vary due to age or medical condition and Propofol infusion  Airway Management Planned: Natural Airway and Simple Face Mask  Additional Equipment:   Intra-op Plan:   Post-operative Plan:   Informed Consent: I have reviewed the patients History and Physical, chart, labs and discussed the procedure including the risks, benefits and alternatives for the proposed anesthesia with the patient or authorized representative who has indicated his/her understanding and acceptance.   Dental advisory given  Plan Discussed with: CRNA  Anesthesia Plan Comments:         Anesthesia Quick Evaluation

## 2018-07-15 NOTE — Discharge Instructions (Signed)
YOU HAD AN ENDOSCOPIC PROCEDURE TODAY: Refer to the procedure report and other information in the discharge instructions given to you for any specific questions about what was found during the examination. If this information does not answer your questions, please call [facility] office at [phone] to clarify.  ° °YOU SHOULD EXPECT: Some feelings of bloating in the abdomen. Passage of more gas than usual. Walking can help get rid of the air that was put into your GI tract during the procedure and reduce the bloating. If you had a lower endoscopy (such as a colonoscopy or flexible sigmoidoscopy) you may notice spotting of blood in your stool or on the toilet paper. Some abdominal soreness may be present for a day or two, also. ° °DIET: Your first meal following the procedure should be a light meal and then it is ok to progress to your normal diet. A half-sandwich or bowl of soup is an example of a good first meal. Heavy or fried foods are harder to digest and may make you feel nauseous or bloated. Drink plenty of fluids but you should avoid alcoholic beverages for 24 hours. If you had a esophageal dilation, please see attached instructions for diet.   ° °ACTIVITY: Your care partner should take you home directly after the procedure. You should plan to take it easy, moving slowly for the rest of the day. You can resume normal activity the day after the procedure however YOU SHOULD NOT DRIVE, use power tools, machinery or perform tasks that involve climbing or major physical exertion for 24 hours (because of the sedation medicines used during the test).  ° °SYMPTOMS TO REPORT IMMEDIATELY: °A gastroenterologist can be reached at any hour. Please call [phone]  for any of the following symptoms:  °Following lower endoscopy (colonoscopy, flexible sigmoidoscopy) °Excessive amounts of blood in the stool  °Significant tenderness, worsening of abdominal pains  °Swelling of the abdomen that is new, acute  °Fever of 100° or  higher  °Following upper endoscopy (EGD, EUS, ERCP, esophageal dilation) °Vomiting of blood or coffee ground material  °New, significant abdominal pain  °New, significant chest pain or pain under the shoulder blades  °Painful or persistently difficult swallowing  °New shortness of breath  °Black, tarry-looking or red, bloody stools ° °FOLLOW UP:  °If any biopsies were taken you will be contacted by phone or by letter within the next 1-3 weeks. Call [phone]  if you have not heard about the biopsies in 3 weeks.  °Please also call with any specific questions about appointments or follow up tests. ° °

## 2018-07-15 NOTE — Transfer of Care (Signed)
Immediate Anesthesia Transfer of Care Note  Patient: Robert [name]  Procedure(s) Performed: ESOPHAGOGASTRODUODENOSCOPY (EGD) WITH PROPOFOL (N/A ) COLONOSCOPY WITH PROPOFOL (N/A ) POLYPECTOMY  Patient Location: PACU  Anesthesia Type:MAC  Level of Consciousness: sedated  Airway & Oxygen Therapy: Patient Spontanous Breathing and Patient connected to nasal cannula oxygen  Post-op Assessment: Report given to RN and Post -op Vital signs reviewed and stable  Post vital signs: Reviewed and stable  Last Vitals:  Vitals Value Taken Time  BP 123/69 07/15/2018 11:28 AM  Temp    Pulse    Resp 22 07/15/2018 11:29 AM  SpO2    Vitals shown include unvalidated device data.  Last Pain:  Vitals:   07/15/18 0952  TempSrc: Oral  PainSc: 0-No pain         Complications: No apparent anesthesia complications

## 2018-07-16 ENCOUNTER — Encounter (HOSPITAL_COMMUNITY): Payer: Self-pay | Admitting: Gastroenterology

## 2018-07-17 ENCOUNTER — Non-Acute Institutional Stay (SKILLED_NURSING_FACILITY): Payer: Medicaid Other | Admitting: Adult Health

## 2018-07-17 ENCOUNTER — Encounter: Payer: Self-pay | Admitting: Adult Health

## 2018-07-17 DIAGNOSIS — J9621 Acute and chronic respiratory failure with hypoxia: Secondary | ICD-10-CM | POA: Diagnosis not present

## 2018-07-17 DIAGNOSIS — T402X5A Adverse effect of other opioids, initial encounter: Secondary | ICD-10-CM

## 2018-07-17 DIAGNOSIS — K5903 Drug induced constipation: Secondary | ICD-10-CM | POA: Diagnosis not present

## 2018-07-17 DIAGNOSIS — I2609 Other pulmonary embolism with acute cor pulmonale: Secondary | ICD-10-CM | POA: Diagnosis not present

## 2018-07-17 DIAGNOSIS — J449 Chronic obstructive pulmonary disease, unspecified: Secondary | ICD-10-CM | POA: Diagnosis not present

## 2018-07-17 DIAGNOSIS — F17200 Nicotine dependence, unspecified, uncomplicated: Secondary | ICD-10-CM

## 2018-07-17 NOTE — Progress Notes (Signed)
Location:   [facility] Room Number: [place] of Service:  SNF (31)   CODE STATUS: Full Code  Allergies  Allergen Reactions  . Ciprofloxacin Hives  . Penicillins Itching    Has patient had a PCN reaction causing immediate rash, facial/tongue/throat swelling, SOB or lightheadedness with hypotension: YES Has patient had a PCN reaction causing severe rash involving mucus membranes or skin necrosis: NO Has patient had a PCN reaction that required hospitalization NO Has patient had a PCN reaction occurring within the last 10 years: NO If all of the above answers are "NO", then may proceed with Cephalosporin use.  . Adhesive  [Tape] Rash  . Morphine And Related Itching and Rash    Chief Complaint  Patient presents with  . Medical Management of Chronic Issues    Acute on chronic respiratory failure with hypoxia; chronic obstructive pulmonary disease unspecified copd type; constipation due to opioid therapy; pulmonary embolism with acute cor pulmonale, unspecified chronicity, unspecified pulmonary embolism type, current every day smoker.     HPI:  He is a 57 year old long term resident of this facility being seen for the management of his chronic illnesses: copd acute on chronic respiratory failure; constipation; pulmonary embolism; smoker. He is using a nicotine patch daily. He denies any cough or shortness of breath. He denies any changes in appetite. He would like his pain medication increased; at this time his pain clinic appointment is 07-22-18; will not make any adjustments.   Past Medical History:  Diagnosis Date  . Abnormal posture   . Acute and chronic respiratory failure with hypoxia ([facility])   . Anxiety   . Arthritis    "I'm eat up w/it"  . Asthma   . B12 deficiency    "give myself shots"  . C. difficile colitis 2011  . Cerebral infarction ([place])   . Chronic liver failure ([place])   . Chronic lower back pain   . Chronic pain syndrome   . Chronic viral  hepatitis C ([place])   . Cirrhosis ([place])   . COPD (chronic obstructive pulmonary disease) ([place])   . Deep vein thrombosis (HCC)    "several"  . Depression   . Gastrointestinal hemorrhage    unspecified   . GERD (gastroesophageal reflux disease)   . Hepatitis C 2011   genotype 1a.  never treated.    Marland Kitchen History of blood transfusion 2013   "related to kidneys shutting down"  . Hypertension   . Hypothyroidism   . Intermittent self-catheterization of bladder   . Liver cancer ([place])   . Metastatic lung cancer    "left"  . Migraines    "2-3/day" (05/27/2014)  . Muscle weakness   . Myocardial infarction ([place]) 2007; ~ 2011  . Other abnormalities of gait and mobility   . Oxygen dependent    3L; 24/7" (05/27/2014)  . Partial small bowel obstruction ([place])   . Personal history of transient ischemic attack (TIA), and cerebral infarction without residual deficits   . Pneumonia    "several times"  . Pulmonary emboli (HCC)    "several"  . Shortness of breath    "all the time"  . Stroke [facility]) ~ 03/2012   "couldn't use my left hand for ~ 6 months" (05/27/2014)  . Stroke [facility]) 05/25/2014   "not able to use my left hand again" (05/27/2014)  . Unspecified asthma with (acute) exacerbation     Past Surgical History:  Procedure Laterality Date  . BIOPSY  05/29/2018  Procedure: BIOPSY;  Surgeon: Lavena Bullion, DO;  Location: [facility];  Service: Gastroenterology;;  . COLONOSCOPY WITH PROPOFOL N/A 07/15/2018   Procedure: COLONOSCOPY WITH PROPOFOL;  Surgeon: Lavena Bullion, DO;  Location: [facility];  Service: Gastroenterology;  Laterality: N/A;  . ESOPHAGOGASTRODUODENOSCOPY (EGD) WITH PROPOFOL N/A 05/29/2018   Procedure: ESOPHAGOGASTRODUODENOSCOPY (EGD) WITH PROPOFOL;  Surgeon: Lavena Bullion, DO;  Location: [facility];  Service: Gastroenterology;  Laterality: N/A;  . ESOPHAGOGASTRODUODENOSCOPY (EGD) WITH PROPOFOL N/A 07/15/2018   Procedure: ESOPHAGOGASTRODUODENOSCOPY (EGD) WITH  PROPOFOL;  Surgeon: Lavena Bullion, DO;  Location: [facility];  Service: Gastroenterology;  Laterality: N/A;  . INCISION AND DRAINAGE ABSCESS Left 05/28/2014   Procedure: INCISION AND DRAINAGE ABSCESS LEFT WRIST;  Surgeon: Leanora Cover, MD;  Location: [place];  Service: Orthopedics;  Laterality: Left;  . KNEE SURGERY Left ~ 1972   "cut top of my kneecap off"  . MULTIPLE TOOTH EXTRACTIONS  11/2010   "26"  . NECK SURGERY     x4   . POLYPECTOMY  07/15/2018   Procedure: POLYPECTOMY;  Surgeon: Lavena Bullion, DO;  Location: [facility];  Service: Gastroenterology;;  . TONSILLECTOMY AND ADENOIDECTOMY  1974  . TOTAL HIP ARTHROPLASTY Right 03/2018  . TRANSURETHRAL RESECTION OF PROSTATE  2012; 2014    Social History   Socioeconomic History  . Marital status: Married    Spouse name: Not on file  . Number of children: 3  . Years of education: Not on file  . Highest education level: Not on file  Occupational History  . Not on file  Social Needs  . Financial resource strain: Not on file  . Food insecurity:    Worry: Not on file    Inability: Not on file  . Transportation needs:    Medical: Not on file    Non-medical: Not on file  Tobacco Use  . Smoking status: Former Smoker    Packs/day: 0.50    Years: 41.00    Pack years: 20.50    Types: Cigarettes  . Smokeless tobacco: Former Systems developer  . Tobacco comment: quit in February 2019  Substance and Sexual Activity  . Alcohol use: Yes    Comment: 8 drinks a week  . Drug use: No  . Sexual activity: Not on file  Lifestyle  . Physical activity:    Days per week: Not on file    Minutes per session: Not on file  . Stress: Not on file  Relationships  . Social connections:    Talks on phone: Not on file    Gets together: Not on file    Attends religious service: Not on file    Active member of club or organization: Not on file    Attends meetings of clubs or organizations: Not on file    Relationship status: Not on file  . Intimate  partner violence:    Fear of current or ex partner: Not on file    Emotionally abused: Not on file    Physically abused: Not on file    Forced sexual activity: Not on file  Other Topics Concern  . Not on file  Social History Narrative  . Not on file   Family History  Problem Relation Age of Onset  . Breast cancer Mother   . Cancer Paternal Aunt   . Cancer Maternal Aunt        think it is breast      VITAL SIGNS BP 135/87   Pulse 91  Temp 97.6 F (36.4 C)   Resp 18   Ht '5\' 7"'$  (1.702 m)   Wt 145 lb 3.2 oz (65.9 kg)   SpO2 97%   BMI 22.74 kg/m   Outpatient Encounter Medications as of 07/17/2018  Medication Sig  . albuterol (PROVENTIL HFA;VENTOLIN HFA) 108 (90 Base) MCG/ACT inhaler Inhale 2 puffs into the lungs every 6 (six) hours as needed for wheezing or shortness of breath.  . diclofenac sodium (VOLTAREN) 1 % GEL Apply 2 g topically 4 (four) times daily.  . DULoxetine (CYMBALTA) 60 MG capsule Take 1 capsule (60 mg total) by mouth 2 (two) times daily.  Derrill Memo ON 08/27/2018] famotidine (PEPCID) 20 MG tablet Take 20 mg by mouth daily. Starting 08/27/18  . ferrous sulfate 325 (65 FE) MG tablet Take 325 mg by mouth daily with breakfast.  . gabapentin (NEURONTIN) 600 MG tablet Take 600 mg by mouth 4 (four) times daily.   . Ipratropium-Albuterol (COMBIVENT RESPIMAT) 20-100 MCG/ACT AERS respimat Inhale 1 puff into the lungs every 6 (six) hours.  Marland Kitchen levothyroxine (SYNTHROID, LEVOTHROID) 50 MCG tablet Take 1 tablet (50 mcg total) by mouth daily before breakfast.  . lidocaine (LIDODERM) 5 % Place 1 patch onto the skin daily. Remove & Discard patch within 12 hours or as directed by MD  . Multiple Vitamin (MULTIVITAMIN) tablet Take 1 tablet by mouth daily.  . nicotine (NICODERM CQ - DOSED IN MG/24 HOURS) 14 mg/24hr patch Place 1 patch (14 mg total) onto the skin daily.  . Nutritional Supplements (NUTRITIONAL SUPPLEMENT PO) NAS (No Added Salt) diet - Regular texture  . Nutritional  Supplements (PROMOD) LIQD Give 30 cc by mouth three times daily  . Oxycodone HCl 10 MG TABS Take 1 tablet (10 mg total) by mouth every 4 (four) hours as needed.  . OXYGEN Place 2 L/min into the nose continuous.  . pantoprazole (PROTONIX) 40 MG tablet Take 40 mg by mouth daily. Ending 08/27/18  . polyethylene glycol (MIRALAX / GLYCOLAX) packet Take 17 g by mouth 2 (two) times daily.  Marland Kitchen senna-docusate (SENOKOT-S) 8.6-50 MG tablet Take 1 tablet by mouth 2 (two) times daily.  Marland Kitchen tiotropium (SPIRIVA) 18 MCG inhalation capsule Place 18 mcg into inhaler and inhale daily.  . vitamin B-12 (CYANOCOBALAMIN) 500 MCG tablet Take 500 mcg by mouth daily.   . [DISCONTINUED] pantoprazole (PROTONIX) 40 MG tablet Take 1 tablet (40 mg total) by mouth 2 (two) times daily. (Patient not taking: Reported on 07/17/2018)  . [DISCONTINUED] Sod Picosulfate-Mag Ox-Cit Acd (CLENPIQ) 10-3.5-12 MG-GM -GM/160ML SOLN Take 1 kit by mouth as directed. (Patient not taking: Reported on 07/17/2018)   No facility-administered encounter medications on file as of 07/17/2018.      SIGNIFICANT DIAGNOSTIC EXAMS  PREVIOUS:   05-28-18: abdominal ultrasound: Unremarkable right upper quadrant ultrasound.  05-29-18: 2-D ECHO:  - Left ventricle: The cavity size was normal. There was mild concentric hypertrophy. Systolic function was normal. The estimated ejection fraction was in the range of 60% to 65%. Wall motion was normal; there were no regional wall motion abnormalities. Doppler parameters are consistent with abnormal left ventricular relaxation (grade 1 diastolic dysfunction). There was no evidence of elevated ventricular filling pressure by Doppler parameters. - Aortic valve: There was no regurgitation. - Mitral valve: There was trivial regurgitation. - Left atrium: The atrium was normal in size. - Right ventricle: The cavity size was moderately dilated. Wall thickness was normal. - Right atrium: The atrium was normal in size. -  Pulmonary arteries:  Systolic pressure was within the normal range. - Inferior vena cava: The vessel was normal in size. - Pericardium, extracardiac: There was no pericardial effusion.  05-31-18: chest x-ray:  Interval development of patchy consolidative opacities within the right mid and lower lung and left lung base concerning for pneumonia in the appropriate clinical setting. Followup PA and lateral chest X-ray is recommended in 3-4 weeks following trial of antibiotic therapy to ensure resolution and exclude underlying malignancy.  NO NEW EXAM    LABS REVIEWED PREVIOUS:  05-29-18: wbc 6.7; hgb 8.7; hc6 27.9; mcv 91.;5 plt 487; glucose 89; bun 28; creat 0.99; k+ 4.5; na++ 141; ca 9.0 occult blood: + urine drug screen is neg; vit B 12: 223; iron 242; tibc 270; ferritin 61 folate 22.9 HIV nr  05-31-18: wbc 15.6; hgb 10.2; hct 30.2; mcv 88.3; plt 490; glucose 94; bun 16; creat 1.08; k+ 4.5; na++ 136; liver normal albumin 3.2 blood and urine culture: no growth  06-04-18: wbc 6.0; hgb 9.3; hct 28.0; mcv 87.5 pt 483 glucose 93; bun 9; creat 0.83; k+ 3.6; na++ 142; ca 8.9  06-07-18: wbc 7.0; hgb 9.0; hct 27.5; mcv 87.9; plt 458 glucose 94; bun 11; creat 0.96; k+ 4.0; na++ 141; ca 8.5   NO NEW LABS.    Review of Systems  Constitutional: Negative for malaise/fatigue.  Respiratory: Negative for cough and shortness of breath.   Cardiovascular: Negative for chest pain, palpitations and leg swelling.  Gastrointestinal: Negative for abdominal pain, constipation and heartburn.  Musculoskeletal: Positive for back pain and joint pain. Negative for myalgias.       Has chronic back and hip pain.   Skin: Negative.   Neurological: Negative for dizziness.  Psychiatric/Behavioral: The patient is not nervous/anxious.     Physical Exam  Constitutional: He is oriented to person, place, and time. No distress.  Frail   Neck: No thyromegaly present.  Cardiovascular: Normal rate, regular rhythm, normal heart sounds  and intact distal pulses.  Pulmonary/Chest: Effort normal and breath sounds normal. No respiratory distress.  02 dependent Breath sound diminished in bases   Abdominal: Soft. Bowel sounds are normal. He exhibits no distension. There is no tenderness.  Musculoskeletal: Normal range of motion. He exhibits no edema.  Lymphadenopathy:    He has no cervical adenopathy.  Neurological: He is alert and oriented to person, place, and time.  Skin: Skin is warm and dry. He is not diaphoretic.  Psychiatric: He has a normal mood and affect.   ASSESSMENT/ PLAN:  TODAY:   1. Current every day smoker: is stable is using nicotine patch 14 mg daily   2.  COPD/acute on chronic respiratory failure with hypoxia/lung cancer : is stable is 02 dependent; will continue albuterol 2 puffs every 6 hours as needed; spiriva 18 mcg daily combivent respimet 1 puff every 6 hours.   3.  Constipation due to opioid therapy: is stable will continue senna s twice daily and miralax twice daily   4. Pulmonary Emboli: is stable is not a candidate for anticoagulation due to GIB and duodenal ulcer   PREVIOUS  5. Chronic back pain/chronic pain syndrome/chronically on opiate therapy: pain is not well managed: will continue voltaren gel 2 gm four times daily; cymbalta 60 mg twice daily  lidoderm patch daily oxycodone 10 mg every 4 hours as needed neurontin to 600 mg four times daily awaiting pain clinic consult on 07-22-18.   6. Normocytic anemia: is stable hgb 9.0; will continue iron daily  7. Duodenal ulcer with acute GI bleeding/liver cancer: is stable will continue protonix 40 mg daily through 08-27-18  8. Acute urinary retention: he has history of TURP 2 times: his flomax had to be stopped due to orthostatic hypotension. Will monitor   9.  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 We will attempt to wean resident as apropriate   Ok Edwards NP [facility]  Contact [phone] Monday through Friday 8am- 5pm  After hours call [phone]

## 2018-07-18 ENCOUNTER — Encounter: Payer: Self-pay | Admitting: Gastroenterology

## 2018-07-21 ENCOUNTER — Telehealth: Payer: Self-pay

## 2018-07-21 NOTE — Telephone Encounter (Signed)
Received a call from [place] regarding a referral for patient to be seen at our office. Referring office would like to inform scheduling of alternative number to reach patient to schedule an appointment.Will inform scheduling of this call. [place]

## 2018-07-30 ENCOUNTER — Encounter: Payer: Self-pay | Admitting: Gastroenterology

## 2018-07-31 ENCOUNTER — Encounter: Payer: Medicaid Other | Admitting: Family

## 2018-08-19 ENCOUNTER — Emergency Department (HOSPITAL_COMMUNITY): Payer: Medicaid Other

## 2018-08-19 ENCOUNTER — Encounter (HOSPITAL_COMMUNITY): Payer: Self-pay | Admitting: Emergency Medicine

## 2018-08-19 ENCOUNTER — Observation Stay (HOSPITAL_COMMUNITY)
Admission: EM | Admit: 2018-08-19 | Discharge: 2018-08-21 | Disposition: A | Discharge status: Home / Self Care | Payer: Medicaid Other | Attending: Internal Medicine | Admitting: Internal Medicine

## 2018-08-19 DIAGNOSIS — G894 Chronic pain syndrome: Secondary | ICD-10-CM | POA: Diagnosis not present

## 2018-08-19 DIAGNOSIS — Z8711 Personal history of peptic ulcer disease: Secondary | ICD-10-CM | POA: Insufficient documentation

## 2018-08-19 DIAGNOSIS — F419 Anxiety disorder, unspecified: Secondary | ICD-10-CM | POA: Insufficient documentation

## 2018-08-19 DIAGNOSIS — J449 Chronic obstructive pulmonary disease, unspecified: Secondary | ICD-10-CM | POA: Diagnosis present

## 2018-08-19 DIAGNOSIS — I10 Essential (primary) hypertension: Secondary | ICD-10-CM | POA: Insufficient documentation

## 2018-08-19 DIAGNOSIS — Z885 Allergy status to narcotic agent status: Secondary | ICD-10-CM | POA: Insufficient documentation

## 2018-08-19 DIAGNOSIS — J9621 Acute and chronic respiratory failure with hypoxia: Secondary | ICD-10-CM | POA: Diagnosis not present

## 2018-08-19 DIAGNOSIS — I2510 Atherosclerotic heart disease of native coronary artery without angina pectoris: Secondary | ICD-10-CM | POA: Diagnosis not present

## 2018-08-19 DIAGNOSIS — Z9889 Other specified postprocedural states: Secondary | ICD-10-CM | POA: Insufficient documentation

## 2018-08-19 DIAGNOSIS — Z79899 Other long term (current) drug therapy: Secondary | ICD-10-CM | POA: Insufficient documentation

## 2018-08-19 DIAGNOSIS — Z881 Allergy status to other antibiotic agents status: Secondary | ICD-10-CM | POA: Insufficient documentation

## 2018-08-19 DIAGNOSIS — Z79890 Hormone replacement therapy (postmenopausal): Secondary | ICD-10-CM | POA: Insufficient documentation

## 2018-08-19 DIAGNOSIS — E034 Atrophy of thyroid (acquired): Secondary | ICD-10-CM | POA: Insufficient documentation

## 2018-08-19 DIAGNOSIS — Z888 Allergy status to other drugs, medicaments and biological substances status: Secondary | ICD-10-CM | POA: Diagnosis not present

## 2018-08-19 DIAGNOSIS — Z7901 Long term (current) use of anticoagulants: Secondary | ICD-10-CM | POA: Diagnosis not present

## 2018-08-19 DIAGNOSIS — K219 Gastro-esophageal reflux disease without esophagitis: Secondary | ICD-10-CM | POA: Insufficient documentation

## 2018-08-19 DIAGNOSIS — D649 Anemia, unspecified: Secondary | ICD-10-CM | POA: Diagnosis not present

## 2018-08-19 DIAGNOSIS — J441 Chronic obstructive pulmonary disease with (acute) exacerbation: Secondary | ICD-10-CM | POA: Insufficient documentation

## 2018-08-19 DIAGNOSIS — B182 Chronic viral hepatitis C: Secondary | ICD-10-CM | POA: Insufficient documentation

## 2018-08-19 DIAGNOSIS — I252 Old myocardial infarction: Secondary | ICD-10-CM | POA: Insufficient documentation

## 2018-08-19 DIAGNOSIS — Z880 Allergy status to penicillin: Secondary | ICD-10-CM | POA: Diagnosis not present

## 2018-08-19 DIAGNOSIS — I700 Atherosclerosis of aorta: Secondary | ICD-10-CM | POA: Insufficient documentation

## 2018-08-19 DIAGNOSIS — K7460 Unspecified cirrhosis of liver: Secondary | ICD-10-CM | POA: Diagnosis not present

## 2018-08-19 DIAGNOSIS — F329 Major depressive disorder, single episode, unspecified: Secondary | ICD-10-CM | POA: Diagnosis not present

## 2018-08-19 DIAGNOSIS — K269 Duodenal ulcer, unspecified as acute or chronic, without hemorrhage or perforation: Secondary | ICD-10-CM | POA: Diagnosis present

## 2018-08-19 DIAGNOSIS — Z79891 Long term (current) use of opiate analgesic: Secondary | ICD-10-CM | POA: Insufficient documentation

## 2018-08-19 DIAGNOSIS — Z87891 Personal history of nicotine dependence: Secondary | ICD-10-CM | POA: Insufficient documentation

## 2018-08-19 DIAGNOSIS — Z96641 Presence of right artificial hip joint: Secondary | ICD-10-CM | POA: Insufficient documentation

## 2018-08-19 DIAGNOSIS — Z9981 Dependence on supplemental oxygen: Secondary | ICD-10-CM | POA: Diagnosis not present

## 2018-08-19 DIAGNOSIS — R599 Enlarged lymph nodes, unspecified: Secondary | ICD-10-CM | POA: Insufficient documentation

## 2018-08-19 LAB — AMMONIA: Ammonia: 24 umol/L (ref 9–35)

## 2018-08-19 LAB — CBC WITH DIFFERENTIAL/PLATELET
Abs Immature Granulocytes: 0.03 10*3/uL (ref 0.00–0.07)
Basophils Absolute: 0.1 10*3/uL (ref 0.0–0.1)
Basophils Relative: 1 %
Eosinophils Absolute: 0.3 10*3/uL (ref 0.0–0.5)
Eosinophils Relative: 3 %
HCT: 30.2 % — ABNORMAL LOW (ref 39.0–52.0)
Hemoglobin: 9.3 g/dL — ABNORMAL LOW (ref 13.0–17.0)
Immature Granulocytes: 0 %
Lymphocytes Relative: 17 %
Lymphs Abs: 1.6 10*3/uL (ref 0.7–4.0)
MCH: 26.4 pg (ref 26.0–34.0)
MCHC: 30.8 g/dL (ref 30.0–36.0)
MCV: 85.8 fL (ref 80.0–100.0)
Monocytes Absolute: 1 10*3/uL (ref 0.1–1.0)
Monocytes Relative: 11 %
Neutro Abs: 6.1 10*3/uL (ref 1.7–7.7)
Neutrophils Relative %: 68 %
Platelets: 302 10*3/uL (ref 150–400)
RBC: 3.52 MIL/uL — ABNORMAL LOW (ref 4.22–5.81)
RDW: 14.8 % (ref 11.5–15.5)
WBC: 9.1 10*3/uL (ref 4.0–10.5)
nRBC: 0 % (ref 0.0–0.2)

## 2018-08-19 LAB — URINALYSIS, ROUTINE W REFLEX MICROSCOPIC
Bilirubin Urine: NEGATIVE
Glucose, UA: NEGATIVE mg/dL
Hgb urine dipstick: NEGATIVE
Ketones, ur: 20 mg/dL — AB
Leukocytes, UA: NEGATIVE
Nitrite: NEGATIVE
Protein, ur: NEGATIVE mg/dL
Specific Gravity, Urine: 1.016 (ref 1.005–1.030)
pH: 5 (ref 5.0–8.0)

## 2018-08-19 LAB — RAPID URINE DRUG SCREEN, HOSP PERFORMED
Amphetamines: NOT DETECTED
Barbiturates: NOT DETECTED
Benzodiazepines: NOT DETECTED
COCAINE: NOT DETECTED
OPIATES: NOT DETECTED
Tetrahydrocannabinol: NOT DETECTED

## 2018-08-19 LAB — BASIC METABOLIC PANEL
Anion gap: 13 (ref 5–15)
BUN: 17 mg/dL (ref 6–20)
CO2: 19 mmol/L — ABNORMAL LOW (ref 22–32)
Calcium: 8.8 mg/dL — ABNORMAL LOW (ref 8.9–10.3)
Chloride: 104 mmol/L (ref 98–111)
Creatinine, Ser: 1.27 mg/dL — ABNORMAL HIGH (ref 0.61–1.24)
Glucose, Bld: 91 mg/dL (ref 70–99)
Potassium: 4.1 mmol/L (ref 3.5–5.1)
Sodium: 136 mmol/L (ref 135–145)

## 2018-08-19 LAB — CBG MONITORING, ED: GLUCOSE-CAPILLARY: 72 mg/dL (ref 70–99)

## 2018-08-19 MED ORDER — IOPAMIDOL (ISOVUE-370) INJECTION 76%
100.0000 mL | Freq: Once | INTRAVENOUS | Status: AC | PRN
  Administered 2018-08-19: 75 mL via INTRAVENOUS

## 2018-08-19 MED ORDER — IOPAMIDOL (ISOVUE-370) INJECTION 76%
INTRAVENOUS | Status: AC
  Filled 2018-08-19: qty 100

## 2018-08-19 NOTE — Care Management (Signed)
Patient was discharged last month from [facility] to [facility] on continues oxygen patient was on home oxygen several years prior to admission. Patient left [facility] this past Friday patient's home oxygen was not set up. CM discussed with EDP we are unable to qualify patient for oxygen from the ED due to acute phase. Updated patient as well.

## 2018-08-19 NOTE — ED Notes (Signed)
TTS in progress 

## 2018-08-19 NOTE — BH Assessment (Addendum)
Tele Assessment Note   Patient Name: Robert [name] MRN: [id number] Referring Physician: Vanita Panda Location of Patient: [facility] ED Location of Provider: [place] [facility]  Lambros Cerro is an 57 y.o. male. The pt came in due to hallucinations.  Last night the pt was seeing and hearing things.  The pt knocked over a book case due to his hallucinations.  The pt stated he started having hallucinations when he changed medications.  The pt denied SA.  However, his RN stated the family stated the pt has a history of alcohol abuse and abusing pain pills.  The pt stated he was taking 30 roxy and now taking hydrocodone 10 and suboxone.  He stated the suboxone is casing him to have hallucinations.  He denies SI and HI.  He is not currently seeing a counselor or psychiatrist currently.  He last saw a counselor 10-20 years ago.  The pt lives with his sister and her husband.  The pt denies self harm, legal issues and history of abuse.  He stated he is not sleeping well at night and has a good appetite.  Pt is dressed in a hospital gown. He is alert and oriented x4. Pt speaks in a clear tone, at moderate volume and normal pace. Eye contact is good. Pt's mood is pleasant. Thought process is coherent and relevant. There is no indication Pt is currently responding to internal stimuli or experiencing delusional thought content.?Pt was cooperative throughout assessment.    Diagnosis: F23 Brief psychotic disorder  Past Medical History:  Past Medical History:  Diagnosis Date  . Abnormal posture   . Acute and chronic respiratory failure with hypoxia ([place])   . Anxiety   . Arthritis    "I'm eat up w/it"  . Asthma   . B12 deficiency    "give myself shots"  . C. difficile colitis 2011  . Cerebral infarction ([place])   . Chronic liver failure ([place])   . Chronic lower back pain   . Chronic pain syndrome   . Chronic viral hepatitis C ([place])   . Cirrhosis ([place])   . COPD (chronic obstructive  pulmonary disease) ([place])   . Deep vein thrombosis (HCC)    "several"  . Depression   . Gastrointestinal hemorrhage    unspecified   . GERD (gastroesophageal reflux disease)   . Hepatitis C 2011   genotype 1a.  never treated.    Marland Kitchen History of blood transfusion 2013   "related to kidneys shutting down"  . Hypertension   . Hypothyroidism   . Intermittent self-catheterization of bladder   . Liver cancer ([place])   . Metastatic lung cancer    "left"  . Migraines    "2-3/day" (05/27/2014)  . Muscle weakness   . Myocardial infarction ([place]) 2007; ~ 2011  . Other abnormalities of gait and mobility   . Oxygen dependent    3L; 24/7" (05/27/2014)  . Partial small bowel obstruction ([place])   . Personal history of transient ischemic attack (TIA), and cerebral infarction without residual deficits   . Pneumonia    "several times"  . Pulmonary emboli (HCC)    "several"  . Shortness of breath    "all the time"  . Stroke [facility]) ~ 03/2012   "couldn't use my left hand for ~ 6 months" (05/27/2014)  . Stroke [facility]) 05/25/2014   "not able to use my left hand again" (05/27/2014)  . Unspecified asthma with (acute) exacerbation     Past Surgical History:  Procedure  Laterality Date  . BIOPSY  05/29/2018   Procedure: BIOPSY;  Surgeon: Lavena Bullion, DO;  Location: [facility];  Service: Gastroenterology;;  . COLONOSCOPY WITH PROPOFOL N/A 07/15/2018   Procedure: COLONOSCOPY WITH PROPOFOL;  Surgeon: Lavena Bullion, DO;  Location: [facility];  Service: Gastroenterology;  Laterality: N/A;  . ESOPHAGOGASTRODUODENOSCOPY (EGD) WITH PROPOFOL N/A 05/29/2018   Procedure: ESOPHAGOGASTRODUODENOSCOPY (EGD) WITH PROPOFOL;  Surgeon: Lavena Bullion, DO;  Location: [facility];  Service: Gastroenterology;  Laterality: N/A;  . ESOPHAGOGASTRODUODENOSCOPY (EGD) WITH PROPOFOL N/A 07/15/2018   Procedure: ESOPHAGOGASTRODUODENOSCOPY (EGD) WITH PROPOFOL;  Surgeon: Lavena Bullion, DO;  Location: [facility];   Service: Gastroenterology;  Laterality: N/A;  . INCISION AND DRAINAGE ABSCESS Left 05/28/2014   Procedure: INCISION AND DRAINAGE ABSCESS LEFT WRIST;  Surgeon: Leanora Cover, MD;  Location: [facility];  Service: Orthopedics;  Laterality: Left;  . KNEE SURGERY Left ~ 1972   "cut top of my kneecap off"  . MULTIPLE TOOTH EXTRACTIONS  11/2010   "26"  . NECK SURGERY     x4   . POLYPECTOMY  07/15/2018   Procedure: POLYPECTOMY;  Surgeon: Lavena Bullion, DO;  Location: [facility];  Service: Gastroenterology;;  . TONSILLECTOMY AND ADENOIDECTOMY  1974  . TOTAL HIP ARTHROPLASTY Right 03/2018  . TRANSURETHRAL RESECTION OF PROSTATE  2012; 2014    Family History:  Family History  Problem Relation Age of Onset  . Breast cancer Mother   . Cancer Paternal Aunt   . Cancer Maternal Aunt        think it is breast    Social History:  reports that he has quit smoking. His smoking use included cigarettes. He has a 20.50 pack-year smoking history. He has quit using smokeless tobacco. He reports that he drinks alcohol. He reports that he does not use drugs.  Additional Social History:  Alcohol / Drug Use Pain Medications: See MAR Prescriptions: See MAR Over the Counter: See MAR History of alcohol / drug use?: No history of alcohol / drug abuse Longest period of sobriety (when/how long): NA  CIWA: CIWA-Ar BP: (!) 151/82 Pulse Rate: (!) 103 COWS:    Allergies:  Allergies  Allergen Reactions  . Ciprofloxacin Hives  . Penicillins Itching    Has patient had a PCN reaction causing immediate rash, facial/tongue/throat swelling, SOB or lightheadedness with hypotension: YES Has patient had a PCN reaction causing severe rash involving mucus membranes or skin necrosis: NO Has patient had a PCN reaction that required hospitalization NO Has patient had a PCN reaction occurring within the last 10 years: NO If all of the above answers are "NO", then may proceed with Cephalosporin use.  . Adhesive  [Tape] Rash   . Morphine And Related Itching and Rash    Home Medications:  (Not in a hospital admission)  OB/GYN Status:  No LMP for male patient.  General Assessment Data Location of Assessment: [facility] ED TTS Assessment: In system Is this a Tele or Face-to-Face Assessment?: Face-to-Face Is this an Initial Assessment or a Re-assessment for this encounter?: Initial Assessment Patient Accompanied by:: N/A Language Other than English: No Living Arrangements: Other (Comment)(home) What gender do you identify as?: Male Marital status: Widowed [place] name: NA Pregnancy Status: No Living Arrangements: Other relatives(sister) Can pt return to current living arrangement?: No(not certain) Admission Status: Voluntary Is patient capable of signing voluntary admission?: Yes Referral Source: Self/Family/Friend Insurance type: Medicaid     Crisis Care Plan Living Arrangements: Other relatives(sister) Legal Guardian: Other:(Self) Name of  Psychiatrist: none Name of Therapist: none  Education Status Is patient currently in school?: No Is the patient employed, unemployed or receiving disability?: Unemployed  Risk to self with the past 6 months Suicidal Ideation: No Has patient been a risk to self within the past 6 months prior to admission? : No Suicidal Intent: No Has patient had any suicidal intent within the past 6 months prior to admission? : No Is patient at risk for suicide?: No Suicidal Plan?: No Has patient had any suicidal plan within the past 6 months prior to admission? : No Access to Means: No What has been your use of drugs/alcohol within the last 12 months?: none currently Previous Attempts/Gestures: No How many times?: 0 Other Self Harm Risks: none Triggers for Past Attempts: None known Intentional Self Injurious Behavior: None Family Suicide History: No Recent stressful life event(s): Other (Comment)(change in medication) Persecutory voices/beliefs?: No Depression:  No Substance abuse history and/or treatment for substance abuse?: No Suicide prevention information given to non-admitted patients: Yes  Risk to Others within the past 6 months Homicidal Ideation: No Does patient have any lifetime risk of violence toward others beyond the six months prior to admission? : No Thoughts of Harm to Others: No Current Homicidal Intent: No Current Homicidal Plan: No Access to Homicidal Means: No Identified Victim: none History of harm to others?: No Assessment of Violence: None Noted Violent Behavior Description: none Does patient have access to weapons?: No Criminal Charges Pending?: No Does patient have a court date: No Is patient on probation?: No  Psychosis Hallucinations: Auditory, Visual Delusions: None noted  Mental Status Report Appearance/Hygiene: Unremarkable, In hospital gown Eye Contact: Good Motor Activity: Unsteady Speech: Logical/coherent Level of Consciousness: Alert Mood: Pleasant Affect: Appropriate to circumstance Anxiety Level: None Thought Processes: Coherent, Relevant Judgement: Partial Orientation: Person, Place, Time, Situation Obsessive Compulsive Thoughts/Behaviors: None  Cognitive Functioning Concentration: Normal Memory: Recent Intact, Remote Intact Is patient IDD: No Insight: Fair Impulse Control: Fair Appetite: Good Have you had any weight changes? : No Change Sleep: Decreased Total Hours of Sleep: 4 Vegetative Symptoms: None  ADLScreening [facility] Assessment Services) Patient's cognitive ability adequate to safely complete daily activities?: Yes Patient able to express need for assistance with ADLs?: Yes Independently performs ADLs?: Yes (appropriate for developmental age)  Prior Inpatient Therapy Prior Inpatient Therapy: No  Prior Outpatient Therapy Prior Outpatient Therapy: Yes Prior Therapy Dates: 10-20 years ago Prior Therapy Facilty/Provider(s): [facility] now called [facility] Reason for  Treatment: unknown Does patient have an ACCT team?: No Does patient have Intensive In-House Services?  : No Does patient have [facility] services? : No Does patient have P4CC services?: No  ADL Screening (condition at time of admission) Patient's cognitive ability adequate to safely complete daily activities?: Yes Patient able to express need for assistance with ADLs?: Yes Independently performs ADLs?: Yes (appropriate for developmental age)       Abuse/Neglect Assessment (Assessment to be complete while patient is alone) Abuse/Neglect Assessment Can Be Completed: Yes Physical Abuse: Denies Verbal Abuse: Denies Sexual Abuse: Denies Exploitation of patient/patient's resources: Denies Self-Neglect: Denies Values / Beliefs Cultural Requests During Hospitalization: None Spiritual Requests During Hospitalization: None Consults Spiritual Care Consult Needed: No Social Work Consult Needed: No            Disposition:  Disposition Initial Assessment Completed for this Encounter: Yes   NP Shuvon Rankin recommends the pt be discharged and follow up with OPT.  RN Tawanna Solo was made aware of the recommendation.  This service was provided via telemedicine using a 2-way, interactive audio and video technology.  Names of all persons participating in this telemedicine service and their role in this encounter. Name: Felicia Both Role: Pt  Name: Virgina Organ Role: TTS  Name:  Role:   Name:  Role:     Enzo Montgomery 08/19/2018 4:55 PM

## 2018-08-19 NOTE — ED Notes (Signed)
Patient transported to X-ray 

## 2018-08-19 NOTE — Progress Notes (Signed)
CSW consulted by PA asking that CSW speak with pt and family regarding need in care. CSW went to speak with pt at bedside however pt was not there. CSW spoke with pt's sister Ginger and was informed that pt has been in and out of different placement over the past 9 months. Sister reports that pt was at [facility] and discharged ([facility] found out that pt signed out AMA per facility).   Sister asked that pt be seen by psych as pt has been hearing and seeing things that others are not seeing. Sister also reports that pt has been "tearing the house up" and she can no longer handle pt at home. PA placing psych consult at this time.     Virgie Dad Marquan Vokes, MSW, [facility] Clinical Social Worker [phone]

## 2018-08-19 NOTE — ED Provider Notes (Signed)
I assumed care of patient in shift change from Ivor Costa, PA-C, please see his note for full H&P.  Briefly patient is here for hypoxia.  He does not have oxygen at home and is supposed to be on 2 L of oxygen.  Plan is to follow-up on labs, TTS.     Physical Exam  BP (!) 151/82 (BP Location: Left Arm)   Pulse (!) 103   Temp 97.9 F (36.6 C) (Oral)   Resp 20   SpO2 100%   Physical Exam  Constitutional: He is oriented to person, place, and time. He appears well-developed. No distress.  HENT:  Head: Normocephalic.  Neck: Normal range of motion.  Neurological: He is alert and oriented to person, place, and time.  Skin: Skin is warm and dry. He is not diaphoretic.  Nursing note and vitals reviewed.   ED Course/Procedures   Clinical Course as of Aug 21 23  Tue Aug 19, 2018  1712 Cleared by psych   [EH]  2109 Turned off patient's oxygen, he dropped to 88% while laying in bed.    [EH]  2153 Spoke with Dr. Hal Hope who says that he may not end up admitting the patient however he will see him and evaluate him.   [EH]    Clinical Course User Index [EH] Ollen Gross    Procedures  Dg Chest 2 View  Result Date: 08/19/2018 CLINICAL DATA:  Shortness of breath.  Chest pain. EXAM: CHEST - 2 VIEW COMPARISON:  05/31/2018 FINDINGS: Previous bilateral lung densities shown in August have resolved. Cardiac and mediastinal margins appear normal. The lungs appear clear. Atherosclerotic calcification of the brachiocephalic artery accounts for some of the density overlying the right medial clavicle. IMPRESSION: 1.  No active cardiopulmonary disease is radiographically apparent. Electronically Signed   By: Van Clines M.D.   On: 08/19/2018 11:54   Ct Angio Chest Pe W/cm &/or Wo Cm  Result Date: 08/19/2018 CLINICAL DATA:  Hypoxia EXAM: CT ANGIOGRAPHY CHEST WITH CONTRAST TECHNIQUE: Multidetector CT imaging of the chest was performed using the standard protocol during  bolus administration of intravenous contrast. Multiplanar CT image reconstructions and MIPs were obtained to evaluate the vascular anatomy. CONTRAST:  33mL ISOVUE-370 IOPAMIDOL (ISOVUE-370) INJECTION 76% COMPARISON:  Chest CT October 02, 2016; chest radiograph August 19, 2018 FINDINGS: Cardiovascular: There is no demonstrable pulmonary embolus. There is no thoracic aortic aneurysm or dissection. Visualized great vessels appear unremarkable. No pericardial effusion or pericardial thickening evident. There are foci of aortic atherosclerosis and coronary artery calcification. Mediastinum/Nodes: Thyroid appears normal. There is a pretracheal lymph node measuring 1.3 x 1.3 cm. No other lymph node prominence evident. No esophageal lesions are appreciable. Lungs/Pleura: There is underlying centrilobular emphysematous change. There is atelectatic change in both lower lobe regions. There is no edema or consolidation. There is benign-appearing pleural thickening laterally on the right at the level of the aortic arch with fat within the area of pleural thickening. No evident pleural effusion. Upper Abdomen: There is aortic atherosclerosis. Visualized upper abdominal structures otherwise appear unremarkable. Musculoskeletal: There are no blastic or lytic bone lesions. No chest wall lesions are apparent. Review of the MIP images confirms the above findings. IMPRESSION: 1. No demonstrable pulmonary embolus. No thoracic aortic aneurysm or dissection. There are foci of aortic atherosclerosis as well as foci of coronary artery calcification. 2. Underlying centrilobular emphysematous change. Bibasilar atelectasis. No lung edema or consolidation. Benign pleural thickening on the right laterally at the aortic arch level. There  is fat within this pleural thickening, consistent with benign etiology. This finding was present on prior CT examination. 3. Slightly enlarged pretracheal lymph node of uncertain etiology. No other adenopathy  evident. Aortic Atherosclerosis (ICD10-I70.0) and Emphysema (ICD10-J43.9). Electronically Signed   By: Lowella Grip III M.D.   On: 08/19/2018 14:52    Labs Reviewed  BASIC METABOLIC PANEL - Abnormal; Notable for the following components:      Result Value   CO2 19 (*)    Creatinine, Ser 1.27 (*)    Calcium 8.8 (*)    All other components within normal limits  CBC WITH DIFFERENTIAL/PLATELET - Abnormal; Notable for the following components:   RBC 3.52 (*)    Hemoglobin 9.3 (*)    HCT 30.2 (*)    All other components within normal limits  URINALYSIS, ROUTINE W REFLEX MICROSCOPIC - Abnormal; Notable for the following components:   Ketones, ur 20 (*)    All other components within normal limits  RAPID URINE DRUG SCREEN, HOSP PERFORMED  [facility]IA  CBG MONITORING, ED   Clinical Course as of Aug 20 26  Tue Aug 19, 2018  1712 Cleared by psych   [EH]  2109 Turned off patient's oxygen, he dropped to 88% while laying in bed.    [EH]  2153 Spoke with Dr. Hal Hope who says that he may not end up admitting the patient however he will see him and evaluate him.   [EH]    Clinical Course User Index [EH] Lorin Glass, PA-C     MDM   I assumed care of patient at shift change from [facility] lower PA-C, please see his note for full H&P.  Briefly patient is here for hypoxia and does not have oxygen at home.  Plan is to follow-up with [facility] cleared patient.  Patient does not have oxygen at home.  While in bed I turned patient's oxygen off and his sats dropped to 88% on room air.  Oxygen was restarted, unable to safely send patient home as he does not have access to oxygen at home.  I spoke with Dr. Hal Hope who admitted patient to the hospital.       Lorin Glass, Hershal Coria 08/20/18 0029    Carmin Muskrat, MD 08/20/18 1754

## 2018-08-19 NOTE — ED Triage Notes (Addendum)
Arrived from home with sister reporting that his O2 was 77% at her home about 6am today. On arrival patient is at 96% on room air.

## 2018-08-19 NOTE — ED Notes (Signed)
Pt oxygen drops to 88% while sleeping, remains above 94% on RA while ambulating

## 2018-08-19 NOTE — ED Notes (Addendum)
Patient's O2 is down to 85 at sleep, 1Lit [place] applied

## 2018-08-19 NOTE — Consult Note (Signed)
  Tele psych Assessment   Robert [name], 57 y.o., male patient seen via tele psych by TTS and this provider; chart reviewed and consulted with Dr. Dwyane Dee on 08/19/18.  On evaluation Robert [name] reports that he had some medication changes and with the new medication "I started seeing thangs, and seeing thangs and shit."  At this time patient denies auditory/visual hallucinations.  "Cause I ain't had no damn medicine; but I need something for this pain."  Patient denies suicidal/self-harm/homicidal ideation, psychosis, and paranoia.  During evaluation Robert [name] is alert/oriented x 4; calm/cooperative; and mood is congruent with affect.  He does not appear to be responding to internal/external stimuli or delusional thoughts; and denies suicidal/self-harm/homicidal ideation, psychosis, and paranoia.  Patient answered question appropriately.  For detailed note see TTS tele assessment note  Recommendations:  Disposition:  Patient is psychiatrically cleared  No evidence of imminent risk to self or others at present.   Patient does not meet criteria for psychiatric inpatient admission. Supportive therapy provided about ongoing stressors. Discussed crisis plan, support from social network, calling 911, coming to the Emergency Department, and calling Suicide Hotline.  Spoke with Dr. Tomi Bamberger informed of above recommendation and disposition  Earleen Newport, NP

## 2018-08-19 NOTE — ED Provider Notes (Signed)
[facility] EMERGENCY DEPARTMENT Provider Note   CSN: [id number] Arrival date & time: 08/19/18  0935     History   Chief Complaint No chief complaint on file.   HPI Robert [name] is a 57 y.o. male.  HPI Patient presents to the emergency department with decreased oxygen saturations.  Patient was placed on oxygen while at the nursing home.  Patient has not been ongoing over the last week in the patient's sister who he lives with now states that his pulse ox's have been low.  The patient states that he does get more short of breath with activities.  The patient denies chest pain, headache,blurred vision, neck pain, fever, cough, weakness, numbness, dizziness, anorexia, edema, abdominal pain, nausea, vomiting, diarrhea, rash, back pain, dysuria, hematemesis, bloody stool, near syncope, or syncope. Past Medical History:  Diagnosis Date  . Abnormal posture   . Acute and chronic respiratory failure with hypoxia ([place])   . Anxiety   . Arthritis    "I'm eat up w/it"  . Asthma   . B12 deficiency    "give myself shots"  . C. difficile colitis 2011  . Cerebral infarction ([place])   . Chronic liver failure ([place])   . Chronic lower back pain   . Chronic pain syndrome   . Chronic viral hepatitis C ([place])   . Cirrhosis ([place])   . COPD (chronic obstructive pulmonary disease) ([place])   . Deep vein thrombosis (HCC)    "several"  . Depression   . Gastrointestinal hemorrhage    unspecified   . GERD (gastroesophageal reflux disease)   . Hepatitis C 2011   genotype 1a.  never treated.    Marland Kitchen History of blood transfusion 2013   "related to kidneys shutting down"  . Hypertension   . Hypothyroidism   . Intermittent self-catheterization of bladder   . Liver cancer ([place])   . Metastatic lung cancer    "left"  . Migraines    "2-3/day" (05/27/2014)  . Muscle weakness   . Myocardial infarction ([place]) 2007; ~ 2011  . Other abnormalities of gait and mobility   . Oxygen dependent     3L; 24/7" (05/27/2014)  . Partial small bowel obstruction ([place])   . Personal history of transient ischemic attack (TIA), and cerebral infarction without residual deficits   . Pneumonia    "several times"  . Pulmonary emboli (HCC)    "several"  . Shortness of breath    "all the time"  . Stroke [facility]) ~ 03/2012   "couldn't use my left hand for ~ 6 months" (05/27/2014)  . Stroke [facility]) 05/25/2014   "not able to use my left hand again" (05/27/2014)  . Unspecified asthma with (acute) exacerbation     Patient Active Problem List   Diagnosis Date Noted  . Special screening for malignant neoplasms, colon   . Multiple polyps of sigmoid colon   . Hypothyroidism due to acquired atrophy of thyroid 06/24/2018  . Current every day smoker 06/24/2018  . Constipation due to opioid therapy 06/24/2018  . Orthostasis 06/04/2018  . Multifocal pneumonia   . Acute urinary retention   . Acute on chronic respiratory failure with hypoxia ([place]) 06/01/2018  . HCAP (healthcare-associated pneumonia) 05/31/2018  . Sepsis ([place]) 05/31/2018  . Tachycardia 05/31/2018  . Fever 05/31/2018  . Duodenal ulcer   . Chronic pain syndrome   . Acute GI bleeding 05/28/2018  . Hypotension   . Normocytic anemia   . Liver cirrhosis ([place])   .  Chronically on opiate therapy 05/27/2014  . Chronic anticoagulation 05/27/2014  . COPD (chronic obstructive pulmonary disease) ([place]) 04/17/2012  . Low back pain 04/17/2012  . Hepatitis C 04/17/2012  . Adynamic ileus ([place]) 04/17/2012  . Liver cancer ([place])   . Lung cancer ([place])   . Pulmonary emboli [facility])     Past Surgical History:  Procedure Laterality Date  . BIOPSY  05/29/2018   Procedure: BIOPSY;  Surgeon: Lavena Bullion, DO;  Location: [facility];  Service: Gastroenterology;;  . COLONOSCOPY WITH PROPOFOL N/A 07/15/2018   Procedure: COLONOSCOPY WITH PROPOFOL;  Surgeon: Lavena Bullion, DO;  Location: [facility];  Service: Gastroenterology;  Laterality: N/A;  .  ESOPHAGOGASTRODUODENOSCOPY (EGD) WITH PROPOFOL N/A 05/29/2018   Procedure: ESOPHAGOGASTRODUODENOSCOPY (EGD) WITH PROPOFOL;  Surgeon: Lavena Bullion, DO;  Location: [facility];  Service: Gastroenterology;  Laterality: N/A;  . ESOPHAGOGASTRODUODENOSCOPY (EGD) WITH PROPOFOL N/A 07/15/2018   Procedure: ESOPHAGOGASTRODUODENOSCOPY (EGD) WITH PROPOFOL;  Surgeon: Lavena Bullion, DO;  Location: [facility];  Service: Gastroenterology;  Laterality: N/A;  . INCISION AND DRAINAGE ABSCESS Left 05/28/2014   Procedure: INCISION AND DRAINAGE ABSCESS LEFT WRIST;  Surgeon: Leanora Cover, MD;  Location: [facility];  Service: Orthopedics;  Laterality: Left;  . KNEE SURGERY Left ~ 1972   "cut top of my kneecap off"  . MULTIPLE TOOTH EXTRACTIONS  11/2010   "26"  . NECK SURGERY     x4   . POLYPECTOMY  07/15/2018   Procedure: POLYPECTOMY;  Surgeon: Lavena Bullion, DO;  Location: [facility];  Service: Gastroenterology;;  . TONSILLECTOMY AND ADENOIDECTOMY  1974  . TOTAL HIP ARTHROPLASTY Right 03/2018  . TRANSURETHRAL RESECTION OF PROSTATE  2012; 2014        Home Medications    Prior to Admission medications   Medication Sig Start Date End Date Taking? Authorizing Provider  albuterol (PROVENTIL HFA;VENTOLIN HFA) 108 (90 Base) MCG/ACT inhaler Inhale 2 puffs into the lungs every 6 (six) hours as needed for wheezing or shortness of breath. 06/08/18   [provider]  diclofenac sodium (VOLTAREN) 1 % GEL Apply 2 g topically 4 (four) times daily.    [provider]  DULoxetine (CYMBALTA) 60 MG capsule Take 1 capsule (60 mg total) by mouth 2 (two) times daily. 06/07/14   Veryl Speak, MD  famotidine (PEPCID) 20 MG tablet Take 20 mg by mouth daily. Starting 08/27/18 08/27/18   [provider]  ferrous sulfate 325 (65 FE) MG tablet Take 325 mg by mouth daily with breakfast. 06/08/18   [provider]  gabapentin (NEURONTIN) 600 MG tablet Take 600 mg by mouth 4 (four) times daily.   06/24/18   [provider]  Ipratropium-Albuterol (COMBIVENT RESPIMAT) 20-100 MCG/ACT AERS respimat Inhale 1 puff into the lungs every 6 (six) hours.    [provider]  levothyroxine (SYNTHROID, LEVOTHROID) 50 MCG tablet Take 1 tablet (50 mcg total) by mouth daily before breakfast. 06/07/14   Veryl Speak, MD  lidocaine (LIDODERM) 5 % Place 1 patch onto the skin daily. Remove & Discard patch within 12 hours or as directed by MD 06/07/18   Eugenie Filler, MD  Multiple Vitamin (MULTIVITAMIN) tablet Take 1 tablet by mouth daily. 06/24/18   [provider]  nicotine (NICODERM CQ - DOSED IN MG/24 HOURS) 14 mg/24hr patch Place 1 patch (14 mg total) onto the skin daily. 06/08/18   Eugenie Filler, MD  Nutritional Supplements (NUTRITIONAL SUPPLEMENT PO) NAS (No Added Salt) diet - Regular texture  [provider]  Nutritional Supplements (PROMOD) LIQD Give 30 cc by mouth three times daily 06/23/18   [provider]  Oxycodone HCl 10 MG TABS Take 1 tablet (10 mg total) by mouth every 4 (four) hours as needed. 07/01/18   Gerlene Fee, NP  OXYGEN Place 2 L/min into the nose continuous. 06/11/18   [provider]  pantoprazole (PROTONIX) 40 MG tablet Take 40 mg by mouth daily. Ending 08/27/18 07/16/18 08/27/18  [provider]  polyethylene glycol (MIRALAX / GLYCOLAX) packet Take 17 g by mouth 2 (two) times daily. 06/07/18   Eugenie Filler, MD  senna-docusate (SENOKOT-S) 8.6-50 MG tablet Take 1 tablet by mouth 2 (two) times daily. 06/07/18   Eugenie Filler, MD  tiotropium (SPIRIVA) 18 MCG inhalation capsule Place 18 mcg into inhaler and inhale daily.    [provider]  vitamin B-12 (CYANOCOBALAMIN) 500 MCG tablet Take 500 mcg by mouth daily.     [provider]    Family History Family History  Problem Relation Age of Onset  . Breast cancer Mother   . Cancer Paternal Aunt   . Cancer Maternal Aunt        think it is  breast    Social History Social History   Tobacco Use  . Smoking status: Former Smoker    Packs/day: 0.50    Years: 41.00    Pack years: 20.50    Types: Cigarettes  . Smokeless tobacco: Former Systems developer  . Tobacco comment: quit in February 2019  Substance Use Topics  . Alcohol use: Yes    Comment: 8 drinks a week  . Drug use: No     Allergies   Ciprofloxacin; Penicillins; Adhesive  [tape]; and Morphine and related   Review of Systems Review of Systems All other systems negative except as documented in the HPI. All pertinent positives and negatives as reviewed in the HPI.  Physical Exam Updated Vital Signs BP (!) 151/82 (BP Location: Left Arm)   Pulse (!) 103   Temp 97.9 F (36.6 C) (Oral)   Resp 20   SpO2 100%   Physical Exam  Constitutional: He is oriented to person, place, and time. He appears well-developed and well-nourished. No distress.  HENT:  Head: Normocephalic and atraumatic.  Mouth/Throat: Oropharynx is clear and moist.  Eyes: Pupils are equal, round, and reactive to light.  Neck: Normal range of motion. Neck supple.  Cardiovascular: Normal rate, regular rhythm and normal heart sounds. Exam reveals no gallop and no friction rub.  No murmur heard. Pulmonary/Chest: Effort normal and breath sounds normal. No respiratory distress. He has no wheezes.  Abdominal: Soft. Bowel sounds are normal. He exhibits no distension. There is no tenderness.  Musculoskeletal: He exhibits no edema.  Neurological: He is alert and oriented to person, place, and time. He exhibits normal muscle tone. Coordination normal.  Skin: Skin is warm and dry. Capillary refill takes less than 2 seconds. No rash noted. No erythema.  Psychiatric: He has a normal mood and affect. His behavior is normal.  Nursing note and vitals reviewed.    ED Treatments / Results  Labs (all labs ordered are listed, but only abnormal results are displayed) Labs Reviewed  BASIC METABOLIC PANEL - Abnormal;  Notable for the following components:      Result Value   CO2 19 (*)    Creatinine, Ser 1.27 (*)    Calcium 8.8 (*)    All other components within normal limits  CBC WITH DIFFERENTIAL/PLATELET - Abnormal; Notable for the following components:   RBC 3.52 (*)    Hemoglobin 9.3 (*)    HCT 30.2 (*)    All other components within normal limits  URINALYSIS, ROUTINE W REFLEX MICROSCOPIC - Abnormal; Notable for the following components:   Ketones, ur 20 (*)    All other components within normal limits    EKG EKG Interpretation  Date/Time:  Tuesday August 19 2018 09:45:27 EST Ventricular Rate:  99 PR Interval:    QRS Duration: 95 QT Interval:  385 QTC Calculation: 495 R Axis:   73 Text Interpretation:  Sinus rhythm Artifact T wave abnormality Abnormal ekg Confirmed by Carmin Muskrat [phone]) on 08/19/2018 9:55:13 AM   Radiology Dg Chest 2 View  Result Date: 08/19/2018 CLINICAL DATA:  Shortness of breath.  Chest pain. EXAM: CHEST - 2 VIEW COMPARISON:  05/31/2018 FINDINGS: Previous bilateral lung densities shown in August have resolved. Cardiac and mediastinal margins appear normal. The lungs appear clear. Atherosclerotic calcification of the brachiocephalic artery accounts for some of the density overlying the right medial clavicle. IMPRESSION: 1.  No active cardiopulmonary disease is radiographically apparent. Electronically Signed   By: Van Clines M.D.   On: 08/19/2018 11:54   Ct Angio Chest Pe W/cm &/or Wo Cm  Result Date: 08/19/2018 CLINICAL DATA:  Hypoxia EXAM: CT ANGIOGRAPHY CHEST WITH CONTRAST TECHNIQUE: Multidetector CT imaging of the chest was performed using the standard protocol during bolus administration of intravenous contrast. Multiplanar CT image reconstructions and MIPs were obtained to evaluate the vascular anatomy. CONTRAST:  63mL ISOVUE-370 IOPAMIDOL (ISOVUE-370) INJECTION 76% COMPARISON:  Chest CT October 02, 2016; chest radiograph August 19, 2018 FINDINGS:  Cardiovascular: There is no demonstrable pulmonary embolus. There is no thoracic aortic aneurysm or dissection. Visualized great vessels appear unremarkable. No pericardial effusion or pericardial thickening evident. There are foci of aortic atherosclerosis and coronary artery calcification. Mediastinum/Nodes: Thyroid appears normal. There is a pretracheal lymph node measuring 1.3 x 1.3 cm. No other lymph node prominence evident. No esophageal lesions are appreciable. Lungs/Pleura: There is underlying centrilobular emphysematous change. There is atelectatic change in both lower lobe regions. There is no edema or consolidation. There is benign-appearing pleural thickening laterally on the right at the level of the aortic arch with fat within the area of pleural thickening. No evident pleural effusion. Upper Abdomen: There is aortic atherosclerosis. Visualized upper abdominal structures otherwise appear unremarkable. Musculoskeletal: There are no blastic or lytic bone lesions. No chest wall lesions are apparent. Review of the MIP images confirms the above findings. IMPRESSION: 1. No demonstrable pulmonary embolus. No thoracic aortic aneurysm or dissection. There are foci of aortic atherosclerosis as well as foci of coronary artery calcification. 2. Underlying centrilobular emphysematous change. Bibasilar atelectasis. No lung edema or consolidation. Benign pleural thickening on the right laterally at the aortic arch level. There is fat within this pleural thickening, consistent with benign etiology. This finding was present on prior CT examination. 3. Slightly enlarged pretracheal lymph node of uncertain etiology. No other adenopathy evident. Aortic Atherosclerosis (ICD10-I70.0) and Emphysema (ICD10-J43.9). Electronically Signed   By: Lowella Grip III M.D.   On: 08/19/2018 14:52    Procedures Procedures (including critical care time)  Medications Ordered in ED Medications  iopamidol (ISOVUE-370) 76 %  injection (has no administration in time range)  iopamidol (ISOVUE-370) 76 % injection 100 mL (75 mLs Intravenous Contrast Given 08/19/18 1429)     Initial Impression / Assessment and Plan / ED Course  I have reviewed the triage vital signs and the nursing notes.  Pertinent labs & imaging results that were available during my care of the patient were reviewed by me and considered in my medical decision making (see chart for details).    The patient has been staying with his sister for the last week.  The patient yesterday had hallucinations and tore up his bedroom that he is staying in with his sister.  The patient does have some liver failure issues in the past.  He is answering questions appropriately although he does seem somewhat somnolent.  Patient took a fair amount of pain medication and has a patch on that he placed 30 minutes prior to arrival.  This could explain some of his somnolence.  She was recently started on Suboxone and the sister states that that seems like it is caused him some issues.  The sister states she does not feel like she is able to care for him at home.  He has had some oxygen requirement here in the emergency department while sleeping especially.  Did speak with a Education officer, museum who looked into this issue with the nursing facility.  The patient did check out AMA.  We will check an ammonia level now that I have seen that he has a history of liver failure.  Patient has been otherwise stable here in the emergency department.  Final Clinical Impressions(s) / ED Diagnoses   Final diagnoses:  None    ED Discharge Orders    None       Dalia Heading, PA-C 08/19/18 1555    Carmin Muskrat, MD 09/03/18 [phone]

## 2018-08-20 ENCOUNTER — Encounter (HOSPITAL_COMMUNITY): Payer: Self-pay | Admitting: Internal Medicine

## 2018-08-20 ENCOUNTER — Other Ambulatory Visit: Payer: Self-pay

## 2018-08-20 DIAGNOSIS — G894 Chronic pain syndrome: Secondary | ICD-10-CM | POA: Diagnosis not present

## 2018-08-20 DIAGNOSIS — J9621 Acute and chronic respiratory failure with hypoxia: Secondary | ICD-10-CM

## 2018-08-20 LAB — CBC
HCT: 30.7 % — ABNORMAL LOW (ref 39.0–52.0)
Hemoglobin: 9.6 g/dL — ABNORMAL LOW (ref 13.0–17.0)
MCH: 26.4 pg (ref 26.0–34.0)
MCHC: 31.3 g/dL (ref 30.0–36.0)
MCV: 84.3 fL (ref 80.0–100.0)
PLATELETS: 327 10*3/uL (ref 150–400)
RBC: 3.64 MIL/uL — ABNORMAL LOW (ref 4.22–5.81)
RDW: 14.7 % (ref 11.5–15.5)
WBC: 6.3 10*3/uL (ref 4.0–10.5)
nRBC: 0 % (ref 0.0–0.2)

## 2018-08-20 LAB — BASIC METABOLIC PANEL
Anion gap: 8 (ref 5–15)
BUN: 11 mg/dL (ref 6–20)
CALCIUM: 8.6 mg/dL — AB (ref 8.9–10.3)
CO2: 24 mmol/L (ref 22–32)
CREATININE: 1.1 mg/dL (ref 0.61–1.24)
Chloride: 108 mmol/L (ref 98–111)
GFR calc Af Amer: >60 mL/min (ref >60)
GFR calc non Af Amer: >60 mL/min (ref >60)
Glucose, Bld: 110 mg/dL — ABNORMAL HIGH (ref 70–99)
Potassium: 3.5 mmol/L (ref 3.5–5.1)
SODIUM: 140 mmol/L (ref 135–145)

## 2018-08-20 MED ORDER — ONDANSETRON HCL 4 MG PO TABS: 4.0000 mg | ORAL_TABLET | Freq: Four times a day (QID) | ORAL | Status: DC | PRN

## 2018-08-20 MED ORDER — ACETAMINOPHEN 325 MG PO TABS
650.0000 mg | ORAL_TABLET | Freq: Four times a day (QID) | ORAL | Status: DC | PRN
  Administered 2018-08-20: 650 mg via ORAL
  Filled 2018-08-20: qty 2

## 2018-08-20 MED ORDER — FERROUS SULFATE 325 (65 FE) MG PO TABS
325.0000 mg | ORAL_TABLET | Freq: Every day | ORAL | Status: DC
  Administered 2018-08-21: 325 mg via ORAL
  Filled 2018-08-20 (×2): qty 1

## 2018-08-20 MED ORDER — ENOXAPARIN SODIUM 40 MG/0.4ML ~~LOC~~ SOLN
40.0000 mg | SUBCUTANEOUS | Status: DC
  Administered 2018-08-20: 40 mg via SUBCUTANEOUS
  Filled 2018-08-20: qty 0.4

## 2018-08-20 MED ORDER — LEVOTHYROXINE SODIUM 50 MCG PO TABS
50.0000 ug | ORAL_TABLET | Freq: Every day | ORAL | Status: DC
  Administered 2018-08-20 – 2018-08-21 (×2): 50 ug via ORAL
  Filled 2018-08-20 (×2): qty 1

## 2018-08-20 MED ORDER — OXYCODONE HCL 5 MG PO TABS
10.0000 mg | ORAL_TABLET | ORAL | Status: DC | PRN
  Administered 2018-08-20 – 2018-08-21 (×6): 10 mg via ORAL
  Filled 2018-08-20 (×7): qty 2

## 2018-08-20 MED ORDER — POLYETHYLENE GLYCOL 3350 17 G PO PACK
17.0000 g | PACK | Freq: Two times a day (BID) | ORAL | Status: DC
  Filled 2018-08-20 (×3): qty 1

## 2018-08-20 MED ORDER — FAMOTIDINE 20 MG PO TABS
20.0000 mg | ORAL_TABLET | Freq: Every day | ORAL | Status: DC
  Administered 2018-08-20 – 2018-08-21 (×2): 20 mg via ORAL
  Filled 2018-08-20 (×2): qty 1

## 2018-08-20 MED ORDER — ALBUTEROL SULFATE (2.5 MG/3ML) 0.083% IN NEBU: 3.0000 mL | INHALATION_SOLUTION | Freq: Four times a day (QID) | RESPIRATORY_TRACT | Status: DC | PRN

## 2018-08-20 MED ORDER — UMECLIDINIUM BROMIDE 62.5 MCG/INH IN AEPB
1.0000 | INHALATION_SPRAY | Freq: Every day | RESPIRATORY_TRACT | Status: DC
  Administered 2018-08-20 – 2018-08-21 (×2): 1 via RESPIRATORY_TRACT
  Filled 2018-08-20: qty 7

## 2018-08-20 MED ORDER — SENNOSIDES-DOCUSATE SODIUM 8.6-50 MG PO TABS
1.0000 | ORAL_TABLET | Freq: Two times a day (BID) | ORAL | Status: DC
  Administered 2018-08-20: 1 via ORAL
  Filled 2018-08-20 (×3): qty 1

## 2018-08-20 MED ORDER — ACETAMINOPHEN 650 MG RE SUPP: 650.0000 mg | Freq: Four times a day (QID) | RECTAL | Status: DC | PRN

## 2018-08-20 MED ORDER — GABAPENTIN 600 MG PO TABS
600.0000 mg | ORAL_TABLET | Freq: Four times a day (QID) | ORAL | Status: DC
  Administered 2018-08-20 – 2018-08-21 (×5): 600 mg via ORAL
  Filled 2018-08-20 (×5): qty 1

## 2018-08-20 MED ORDER — LIDOCAINE 5 % EX PTCH
1.0000 | MEDICATED_PATCH | CUTANEOUS | Status: DC
  Administered 2018-08-20 – 2018-08-21 (×2): 1 via TRANSDERMAL
  Filled 2018-08-20 (×2): qty 1

## 2018-08-20 MED ORDER — ONDANSETRON HCL 4 MG/2ML IJ SOLN: 4.0000 mg | Freq: Four times a day (QID) | INTRAMUSCULAR | Status: DC | PRN

## 2018-08-20 MED ORDER — PANTOPRAZOLE SODIUM 40 MG PO TBEC
40.0000 mg | DELAYED_RELEASE_TABLET | Freq: Every day | ORAL | Status: DC
  Administered 2018-08-20 – 2018-08-21 (×2): 40 mg via ORAL
  Filled 2018-08-20 (×2): qty 1

## 2018-08-20 MED ORDER — CYANOCOBALAMIN 500 MCG PO TABS
500.0000 ug | ORAL_TABLET | Freq: Every day | ORAL | Status: DC
  Administered 2018-08-20 – 2018-08-21 (×2): 500 ug via ORAL
  Filled 2018-08-20 (×2): qty 1

## 2018-08-20 MED ORDER — ADULT MULTIVITAMIN W/MINERALS CH
1.0000 | ORAL_TABLET | Freq: Every day | ORAL | Status: DC
  Administered 2018-08-20 – 2018-08-21 (×2): 1 via ORAL
  Filled 2018-08-20 (×2): qty 1

## 2018-08-20 NOTE — Plan of Care (Signed)
  Problem: Education: Goal: Knowledge of General Education information will improve Description: Including pain rating scale, medication(s)/side effects and non-pharmacologic comfort measures Outcome: Progressing   Problem: Clinical Measurements: Goal: Ability to maintain clinical measurements within normal limits will improve Outcome: Progressing Goal: Will remain free from infection Outcome: Progressing   

## 2018-08-20 NOTE — H&P (Signed)
History and Physical    Robert [name] DGU:[id number] DOB: 05-01-1961 DOA: 08/19/2018  PCP: Patient, No Pcp Per  Patient coming from: Home.  Chief Complaint: Hypoxia.  HPI: Robert [name] is a 57 y.o. male with history of COPD on home oxygen, hypothyroidism, chronic pain, peptic ulcer disease who was admitted last month for acute respiratory failure with sepsis secondary to pneumonia and COPD exacerbation during which patient also was found to have a GI bleed and had undergone EGD and colonoscopy which were largely unremarkable eventually discharged to rehab but patient signed out AMA few days ago from the rehab and had been living with his sister and was found to be hypoxic.  Patient usually has home oxygen but since patient left AMA was not having any at home.  Since patient was hypoxic patient was referred to the ER.  ED Course: In the ER patient was found to be hypoxic but not wheezing CT angiogram of the chest did not show anything acute.  There was some concern for hallucination and psychiatric consult was obtained and per them no admission required at psychiatric facility.  Patient is being admitted for acute hypoxic respiratory failure.  Review of Systems: As per HPI, rest all negative.   Past Medical History:  Diagnosis Date  . Abnormal posture   . Acute and chronic respiratory failure with hypoxia ([place])   . Anxiety   . Arthritis    "I'm eat up w/it"  . Asthma   . B12 deficiency    "give myself shots"  . C. difficile colitis 2011  . Cerebral infarction ([place])   . Chronic liver failure ([place])   . Chronic lower back pain   . Chronic pain syndrome   . Chronic viral hepatitis C ([place])   . Cirrhosis ([place])   . COPD (chronic obstructive pulmonary disease) ([place])   . Deep vein thrombosis (HCC)    "several"  . Depression   . Gastrointestinal hemorrhage    unspecified   . GERD (gastroesophageal reflux disease)   . Hepatitis C 2011   genotype 1a.  never treated.    Marland Kitchen  History of blood transfusion 2013   "related to kidneys shutting down"  . Hypertension   . Hypothyroidism   . Intermittent self-catheterization of bladder   . Liver cancer ([place])   . Metastatic lung cancer    "left"  . Migraines    "2-3/day" (05/27/2014)  . Muscle weakness   . Myocardial infarction ([place]) 2007; ~ 2011  . Other abnormalities of gait and mobility   . Oxygen dependent    3L; 24/7" (05/27/2014)  . Partial small bowel obstruction ([place])   . Personal history of transient ischemic attack (TIA), and cerebral infarction without residual deficits   . Pneumonia    "several times"  . Pulmonary emboli (HCC)    "several"  . Shortness of breath    "all the time"  . Stroke [facility]) ~ 03/2012   "couldn't use my left hand for ~ 6 months" (05/27/2014)  . Stroke [facility]) 05/25/2014   "not able to use my left hand again" (05/27/2014)  . Unspecified asthma with (acute) exacerbation     Past Surgical History:  Procedure Laterality Date  . BIOPSY  05/29/2018   Procedure: BIOPSY;  Surgeon: Lavena Bullion, DO;  Location: [facility];  Service: Gastroenterology;;  . COLONOSCOPY WITH PROPOFOL N/A 07/15/2018   Procedure: COLONOSCOPY WITH PROPOFOL;  Surgeon: Lavena Bullion, DO;  Location: [facility];  Service: Gastroenterology;  Laterality: N/A;  . ESOPHAGOGASTRODUODENOSCOPY (EGD) WITH PROPOFOL N/A 05/29/2018   Procedure: ESOPHAGOGASTRODUODENOSCOPY (EGD) WITH PROPOFOL;  Surgeon: Lavena Bullion, DO;  Location: [facility];  Service: Gastroenterology;  Laterality: N/A;  . ESOPHAGOGASTRODUODENOSCOPY (EGD) WITH PROPOFOL N/A 07/15/2018   Procedure: ESOPHAGOGASTRODUODENOSCOPY (EGD) WITH PROPOFOL;  Surgeon: Lavena Bullion, DO;  Location: [facility];  Service: Gastroenterology;  Laterality: N/A;  . INCISION AND DRAINAGE ABSCESS Left 05/28/2014   Procedure: INCISION AND DRAINAGE ABSCESS LEFT WRIST;  Surgeon: Leanora Cover, MD;  Location: [facility];  Service: Orthopedics;  Laterality: Left;  . KNEE  SURGERY Left ~ 1972   "cut top of my kneecap off"  . MULTIPLE TOOTH EXTRACTIONS  11/2010   "26"  . NECK SURGERY     x4   . POLYPECTOMY  07/15/2018   Procedure: POLYPECTOMY;  Surgeon: Lavena Bullion, DO;  Location: [facility];  Service: Gastroenterology;;  . TONSILLECTOMY AND ADENOIDECTOMY  1974  . TOTAL HIP ARTHROPLASTY Right 03/2018  . TRANSURETHRAL RESECTION OF PROSTATE  2012; 2014     reports that he has quit smoking. His smoking use included cigarettes. He has a 20.50 pack-year smoking history. He has quit using smokeless tobacco. He reports that he drinks alcohol. He reports that he does not use drugs.  Allergies  Allergen Reactions  . Ciprofloxacin Hives  . Penicillins Itching    Has patient had a PCN reaction causing immediate rash, facial/tongue/throat swelling, SOB or lightheadedness with hypotension: YES Has patient had a PCN reaction causing severe rash involving mucus membranes or skin necrosis: NO Has patient had a PCN reaction that required hospitalization NO Has patient had a PCN reaction occurring within the last 10 years: NO If all of the above answers are "NO", then may proceed with Cephalosporin use.  . Adhesive  [Tape] Rash  . Morphine And Related Itching and Rash    Family History  Problem Relation Age of Onset  . Breast cancer Mother   . Cancer Paternal Aunt   . Cancer Maternal Aunt        think it is breast    Prior to Admission medications   Medication Sig Start Date End Date Taking? Authorizing Provider  albuterol (PROVENTIL HFA;VENTOLIN HFA) 108 (90 Base) MCG/ACT inhaler Inhale 2 puffs into the lungs every 6 (six) hours as needed for wheezing or shortness of breath. 06/08/18   [provider]  diclofenac sodium (VOLTAREN) 1 % GEL Apply 2 g topically 4 (four) times daily.    [provider]  DULoxetine (CYMBALTA) 60 MG capsule Take 1 capsule (60 mg total) by mouth 2 (two) times daily. 06/07/14   Veryl Speak, MD  famotidine  (PEPCID) 20 MG tablet Take 20 mg by mouth daily. Starting 08/27/18 08/27/18   [provider]  ferrous sulfate 325 (65 FE) MG tablet Take 325 mg by mouth daily with breakfast. 06/08/18   [provider]  gabapentin (NEURONTIN) 600 MG tablet Take 600 mg by mouth 4 (four) times daily.  06/24/18   [provider]  Ipratropium-Albuterol (COMBIVENT RESPIMAT) 20-100 MCG/ACT AERS respimat Inhale 1 puff into the lungs every 6 (six) hours.    [provider]  levothyroxine (SYNTHROID, LEVOTHROID) 50 MCG tablet Take 1 tablet (50 mcg total) by mouth daily before breakfast. 06/07/14   Veryl Speak, MD  lidocaine (LIDODERM) 5 % Place 1 patch onto the skin daily. Remove & Discard patch within 12 hours or as directed by MD 06/07/18   Eugenie Filler, MD  Multiple Vitamin (MULTIVITAMIN) tablet Take 1 tablet by mouth daily. 06/24/18   [provider]  nicotine (NICODERM CQ - DOSED IN MG/24 HOURS) 14 mg/24hr patch Place 1 patch (14 mg total) onto the skin daily. 06/08/18   Eugenie Filler, MD  Nutritional Supplements (NUTRITIONAL SUPPLEMENT PO) NAS (No Added Salt) diet - Regular texture    [provider]  Nutritional Supplements (PROMOD) LIQD Give 30 cc by mouth three times daily 06/23/18   [provider]  Oxycodone HCl 10 MG TABS Take 1 tablet (10 mg total) by mouth every 4 (four) hours as needed. 07/01/18   Gerlene Fee, NP  OXYGEN Place 2 L/min into the nose continuous. 06/11/18   [provider]  pantoprazole (PROTONIX) 40 MG tablet Take 40 mg by mouth daily. Ending 08/27/18 07/16/18 08/27/18  [provider]  polyethylene glycol (MIRALAX / GLYCOLAX) packet Take 17 g by mouth 2 (two) times daily. 06/07/18   Eugenie Filler, MD  senna-docusate (SENOKOT-S) 8.6-50 MG tablet Take 1 tablet by mouth 2 (two) times daily. 06/07/18   Eugenie Filler, MD  tiotropium (SPIRIVA) 18 MCG inhalation capsule Place 18 mcg into inhaler and inhale  daily.    [provider]  vitamin B-12 (CYANOCOBALAMIN) 500 MCG tablet Take 500 mcg by mouth daily.     [provider]    Physical Exam: Vitals:   08/19/18 2115 08/19/18 2231 08/19/18 2353 08/20/18 0057  BP: (!) 113/53 121/78 138/77 119/79  Pulse: 60 93 77 80  Resp: 17 16 20 20   Temp:   97.8 F (36.6 C) (!) 97.4 F (36.3 C)  TempSrc:   Oral Oral  SpO2: 99% 95% 98% 96%  Weight:    69.6 kg  Height:    5\' 6"  (1.676 m)      Constitutional: Moderately built and nourished. Vitals:   08/19/18 2115 08/19/18 2231 08/19/18 2353 08/20/18 0057  BP: (!) 113/53 121/78 138/77 119/79  Pulse: 60 93 77 80  Resp: 17 16 20 20   Temp:   97.8 F (36.6 C) (!) 97.4 F (36.3 C)  TempSrc:   Oral Oral  SpO2: 99% 95% 98% 96%  Weight:    69.6 kg  Height:    5\' 6"  (1.676 m)   Eyes: Anicteric no pallor. ENMT: No discharge from the ears eyes nose or mouth. Neck: No mass or.  No neck rigidity. Respiratory: No rhonchi or crepitations. Cardiovascular: S1-S2 heard no murmurs appreciated. Abdomen: Soft nontender bowel sounds present. Musculoskeletal: No edema.  No joint effusion. Skin: No rash. Neurologic: Alert awake oriented to time place and person.  Moves all extremities. Psychiatric: Appears normal.  Normal affect.   Labs on Admission: I have personally reviewed following labs and imaging studies  CBC: Recent Labs  Lab 08/19/18 1055  WBC 9.1  NEUTROABS 6.1  HGB 9.3*  HCT 30.2*  MCV 85.8  PLT 588   Basic Metabolic Panel: Recent Labs  Lab 08/19/18 1055  NA 136  K 4.1  CL 104  CO2 19*  GLUCOSE 91  BUN 17  CREATININE 1.27*  CALCIUM 8.8*   GFR: Estimated Creatinine Clearance: 57.9 mL/min (A) (by C-G formula based on SCr of 1.27 mg/dL (H)). Liver Function Tests: No results for input(s): AST, ALT, ALKPHOS, BILITOT, PROT, ALBUMIN in the last 168 hours. No results for input(s): LIPASE, AMYLASE in the last 168 hours. Recent Labs  Lab 08/19/18 1940  AMMONIA 24    Coagulation Profile: No results for  input(s): INR, PROTIME in the last 168 hours. Cardiac Enzymes: No results for input(s): CKTOTAL, CKMB, CKMBINDEX, TROPONINI in the last 168 hours. BNP (last 3 results) No results for input(s): PROBNP in the last 8760 hours. HbA1C: No results for input(s): HGBA1C in the last 72 hours. CBG: Recent Labs  Lab 08/19/18 1627  GLUCAP 72   Lipid Profile: No results for input(s): CHOL, HDL, LDLCALC, TRIG, CHOLHDL, LDLDIRECT in the last 72 hours. Thyroid Function Tests: No results for input(s): TSH, T4TOTAL, FREET4, T3FREE, THYROIDAB in the last 72 hours. Anemia Panel: No results for input(s): VITAMINB12, FOLATE, FERRITIN, TIBC, IRON, RETICCTPCT in the last 72 hours. Urine analysis:    Component Value Date/Time   COLORURINE YELLOW 08/19/2018 [place] 08/19/2018 1055   LABSPEC 1.016 08/19/2018 1055   PHURINE 5.0 08/19/2018 1055   GLUCOSEU NEGATIVE 08/19/2018 1055   HGBUR NEGATIVE 08/19/2018 1055   BILIRUBINUR NEGATIVE 08/19/2018 1055   KETONESUR 20 (A) 08/19/2018 1055   PROTEINUR NEGATIVE 08/19/2018 1055   UROBILINOGEN 0.2 05/27/2014 1652   NITRITE NEGATIVE 08/19/2018 1055   LEUKOCYTESUR NEGATIVE 08/19/2018 1055   Sepsis Labs: @LABRCNTIP (procalcitonin:4,lacticidven:4) )No results found for this or any previous visit (from the past 240 hour(s)).   Radiological Exams on Admission: Dg Chest 2 View  Result Date: 08/19/2018 CLINICAL DATA:  Shortness of breath.  Chest pain. EXAM: CHEST - 2 VIEW COMPARISON:  05/31/2018 FINDINGS: Previous bilateral lung densities shown in August have resolved. Cardiac and mediastinal margins appear normal. The lungs appear clear. Atherosclerotic calcification of the brachiocephalic artery accounts for some of the density overlying the right medial clavicle. IMPRESSION: 1.  No active cardiopulmonary disease is radiographically apparent. Electronically Signed   By: Van Clines M.D.   On:  08/19/2018 11:54   Ct Angio Chest Pe W/cm &/or Wo Cm  Result Date: 08/19/2018 CLINICAL DATA:  Hypoxia EXAM: CT ANGIOGRAPHY CHEST WITH CONTRAST TECHNIQUE: Multidetector CT imaging of the chest was performed using the standard protocol during bolus administration of intravenous contrast. Multiplanar CT image reconstructions and MIPs were obtained to evaluate the vascular anatomy. CONTRAST:  63mL ISOVUE-370 IOPAMIDOL (ISOVUE-370) INJECTION 76% COMPARISON:  Chest CT October 02, 2016; chest radiograph August 19, 2018 FINDINGS: Cardiovascular: There is no demonstrable pulmonary embolus. There is no thoracic aortic aneurysm or dissection. Visualized great vessels appear unremarkable. No pericardial effusion or pericardial thickening evident. There are foci of aortic atherosclerosis and coronary artery calcification. Mediastinum/Nodes: Thyroid appears normal. There is a pretracheal lymph node measuring 1.3 x 1.3 cm. No other lymph node prominence evident. No esophageal lesions are appreciable. Lungs/Pleura: There is underlying centrilobular emphysematous change. There is atelectatic change in both lower lobe regions. There is no edema or consolidation. There is benign-appearing pleural thickening laterally on the right at the level of the aortic arch with fat within the area of pleural thickening. No evident pleural effusion. Upper Abdomen: There is aortic atherosclerosis. Visualized upper abdominal structures otherwise appear unremarkable. Musculoskeletal: There are no blastic or lytic bone lesions. No chest wall lesions are apparent. Review of the MIP images confirms the above findings. IMPRESSION: 1. No demonstrable pulmonary embolus. No thoracic aortic aneurysm or dissection. There are foci of aortic atherosclerosis as well as foci of coronary artery calcification. 2. Underlying centrilobular emphysematous change. Bibasilar atelectasis. No lung edema or consolidation. Benign pleural thickening on the right  laterally at the aortic arch level. There is fat within this pleural thickening, consistent with benign etiology. This finding was present on prior CT  examination. 3. Slightly enlarged pretracheal lymph node of uncertain etiology. No other adenopathy evident. Aortic Atherosclerosis (ICD10-I70.0) and Emphysema (ICD10-J43.9). Electronically Signed   By: Lowella Grip III M.D.   On: 08/19/2018 14:52    EKG: Independently reviewed.  Normal sinus rhythm.  Assessment/Plan Principal Problem:   Acute and chronic respiratory failure with hypoxia (HCC) Active Problems:   COPD (chronic obstructive pulmonary disease) (HCC)   Liver cirrhosis (HCC)   Chronic pain syndrome   Duodenal ulcer   Hypothyroidism due to acquired atrophy of thyroid    1. Acute on chronic respiratory failure with hypoxia -patient not actively wheezing likely from chronic COPD.  Will need to check for home oxygen qualification.  Continue home inhalers. 2. Chronic pain on oxycodone and gabapentin.  Is also increased pain on walking for which I have consulted physical therapy. 3. History of peptic ulcer disease recent EGD was unremarkable.  On PPI. 4. Hypothyroidism on Synthroid. 5. Chronic anemia on iron and B12 supplements. 6. History of cirrhosis per the chart presently appears compensated.   DVT prophylaxis: Lovenox. Code Status: Full code. Family Communication: Discussed with patient. Disposition Plan: Home. Consults called: None. Admission status: Observation.   Rise Patience MD [facility] Pager [phone].  If 7PM-7AM, please contact night-coverage [email or web address] Password [email or web address]  08/20/2018, 1:38 AM

## 2018-08-20 NOTE — Evaluation (Signed)
Physical Therapy Evaluation Patient Details Name: Robert [name] MRN: [id number] DOB: 04-Feb-1961 Today's Date: 08/20/2018   History of Present Illness  Pt is a 57 y.o. male admitted 08/19/18 with hypoxia; worked up for acute hypoxic respiratory failure. CT angiogram without acute abnormality. Concern for hallucinations; psych consulted without need for inpatient admission. Of note, pt recently admitted with sepsis secondary to PNA and COPD exacerbation. PMH includes COPD, chronic pain, stroke, liver CA, depression, CVT, Hep-C, asthma, arthritis, anxiety.\   Clinical Impression  Pt presents with an overall decrease in functional mobility secondary to above. PTA, pt living at sister's home since leaving SNF; states he plans to d/c to grandmother's home where no one lives. Today, pt required supervision for seated and standing mobility tasks. Ambulation limited secondary to c/o back and BLE pain. Pt stating "I cannot walk" multiple times. Pt very agitated and ready for discharge. Discussed recommendations for SNF-level therapies to maximize functional mobility and independence prior to return home; pt refusing. Recommend use of wheelchair to maximize mobility since pt's ambulation is limited by pain, fatigue, and weakness.     Follow Up Recommendations SNF;Supervision for mobility/OOB(refused SNF)    Equipment Recommendations  Wheelchair (measurements PT)    Recommendations for Other Services       Precautions / Restrictions Precautions Precautions: Fall Restrictions Weight Bearing Restrictions: No      Mobility  Bed Mobility Overal bed mobility: Modified Independent                Transfers Overall transfer level: Needs assistance Equipment used: Rolling walker (2 wheeled) Transfers: Sit to/from Stand Sit to Stand: Supervision         General transfer comment: Stood with RW to don pants standing at EOB; supervision for safety secondary to fall  risk  Ambulation/Gait             General Gait Details: Deferred secondary to c/o pain and fatigue  Stairs            Wheelchair Mobility    Modified Rankin (Stroke Patients Only)       Balance Overall balance assessment: Needs assistance   Sitting balance-Leahy Scale: Good       Standing balance-Leahy Scale: Fair Standing balance comment: Can static stand to buckle pants and belt standing inside RW without UE support                             Pertinent Vitals/Pain Pain Assessment: Faces Faces Pain Scale: Hurts little more Pain Location: Generalized Pain Descriptors / Indicators: Constant Pain Intervention(s): Limited activity within patient's tolerance;Monitored during session    Home Living Family/patient expects to be discharged to:: Unsure Living Arrangements: Other relatives               Additional Comments: Pt admitted from sister's home where he has been staying since recently leaving SNF AMA. Reports he will d/c to empty grandmother's home without assist; multiple stairs to enter and 3-level home    Prior Function Level of Independence: Needs assistance   Gait / Transfers Assistance Needed: Poor and inconsistent historian. Reports use of walker or cane, does not move much; multiple falls           Hand Dominance        Extremity/Trunk Assessment   Upper Extremity Assessment Upper Extremity Assessment: Generalized weakness    Lower Extremity Assessment Lower Extremity Assessment: Generalized weakness  Communication   Communication: Other (comment)(Speaks fast, mostly curse words)  Cognition Arousal/Alertness: Awake/alert Behavior During Therapy: Restless;Agitated Overall Cognitive Status: No family/caregiver present to determine baseline cognitive functioning                                 General Comments: Pt speaking very fast, mostly curse words, not answering questions consistently,  rambling about how agitated he is with all aspects of everything. Poor attention, difficult to redirect. Poor insight into deficits; decreased awareness      General Comments General comments (skin integrity, edema, etc.): SpO2 >94% on RA with seated and standing tasks (NT with ambulation)    Exercises     Assessment/Plan    PT Assessment Patient needs continued PT services  PT Problem List Decreased strength;Decreased activity tolerance;Decreased balance;Decreased mobility;Decreased cognition;Decreased knowledge of use of DME;Decreased safety awareness       PT Treatment Interventions DME instruction;Gait training;Stair training;Functional mobility training;Therapeutic activities;Therapeutic exercise;Balance training;Cognitive remediation;Patient/family education;Wheelchair mobility training    PT Goals (Current goals can be found in the Care Plan section)  Acute Rehab PT Goals Patient Stated Goal: Discharge to grandmother's home by himself; decreased pain PT Goal Formulation: With patient Time For Goal Achievement: 09/03/18 Potential to Achieve Goals: Fair    Frequency Min 3X/week   Barriers to discharge Decreased caregiver support;Inaccessible home environment      Co-evaluation               AM-PAC PT "6 Clicks" Daily Activity  Outcome Measure Difficulty turning over in bed (including adjusting bedclothes, sheets and blankets)?: None Difficulty moving from lying on back to sitting on the side of the bed? : None Difficulty sitting down on and standing up from a chair with arms (e.g., wheelchair, bedside commode, etc,.)?: A Little Help needed moving to and from a bed to chair (including a wheelchair)?: A Little Help needed walking in hospital room?: A Lot Help needed climbing 3-5 steps with a railing? : A Lot 6 Click Score: 18    End of Session   Activity Tolerance: Patient limited by fatigue;Patient limited by pain Patient left: in bed;with call bell/phone  within reach(with MD and CM present) Nurse Communication: Mobility status PT Visit Diagnosis: Other abnormalities of gait and mobility (R26.89);Muscle weakness (generalized) (M62.81)    Time: 1660-6004 PT Time Calculation (min) (ACUTE ONLY): 19 min   Charges:   PT Evaluation $PT Eval Moderate Complexity: B[place], PT, DPT Acute Rehabilitation Services  Pager [phone] [place] 08/20/2018, 11:37 AM

## 2018-08-20 NOTE — Progress Notes (Signed)
Patient admitted overnight, H&P reviewed and agree with the assessment and plan  57 year old male with COPD on home oxygen, hypothyroidism, chronic pain, PUD, admitted last week with respiratory failure with sepsis secondary to pneumonia and COPD exacerbation, also had a GI bleed status post EGD and colonoscopy, went to SNF but left AMA a few days ago without oxygen.  He was home, became weak and passed out.  EMS was called and she was brought to the hospital.  There was concern in the emergency room regarding visual and auditory hallucinations and psychiatry was consulted on 08/19/2018.  Patient was psychiatrically cleared.   Chronic hypoxic respiratory failure -We will need his home oxygen resumed, he chronically is on 3 L for his COPD.  Discussed with care management  Chronic pain -Continue oxycodone, PT consulted and recommending SNF however patient refusing  History of PUD with prior GI bleed -No issues, continue PPI  Hypothyroidism -Continue Synthroid  Chronic anemia -Continue B12  History of cirrhosis per chart -Appears compensated  COPD -Quit smoking 9 months ago, no wheezing, continue home inhalers  Scheduled Meds: . enoxaparin (LOVENOX) injection  40 mg Subcutaneous Q24H  . famotidine  20 mg Oral Daily  . ferrous sulfate  325 mg Oral Q breakfast  . gabapentin  600 mg Oral QID  . levothyroxine  50 mcg Oral QAC breakfast  . lidocaine  1 patch Transdermal Q24H  . multivitamin with minerals  1 tablet Oral Daily  . pantoprazole  40 mg Oral Daily  . polyethylene glycol  17 g Oral BID  . senna-docusate  1 tablet Oral BID  . umeclidinium bromide  1 puff Inhalation Daily  . vitamin B-12  500 mcg Oral Daily   Continuous Infusions: PRN Meds:.acetaminophen **OR** acetaminophen, albuterol, ondansetron **OR** ondansetron (ZOFRAN) IV, oxyCODONE   Abyan Cadman M. Cruzita Lederer, MD, PhD [facility] Pager [phone]  If 7PM-7AM, please contact  night-coverage [email or web address] Password [email or web address]

## 2018-08-20 NOTE — Progress Notes (Signed)
Patient got agitated when asked if we could ambulate per MD. Patient pulled telemetry off and started cursing. MD paged made aware.

## 2018-08-20 NOTE — Progress Notes (Signed)
Physical Therapy Treatment Patient Details Name: Robert [name] MRN: [id number] DOB: 1961/03/28 Today's Date: 08/20/2018    History of Present Illness Pt is a 57 y.o. male admitted 08/19/18 with hypoxia; worked up for acute hypoxic respiratory failure. CT angiogram without acute abnormality. Concern for hallucinations; psych consulted without need for inpatient admission. Of note, pt recently admitted with sepsis secondary to PNA and COPD exacerbation. PMH includes COPD, chronic pain, stroke, liver CA, depression, CVT, Hep-C, asthma, arthritis, anxiety.   PT Comments    Pt seen again for ambulation and saturation qualifications, as pt had declined ambulation with PT/RN earlier, but now agreeable. Pt mod indep for transfers to wheelchair; ambulatory with RW at supervision-level for safety. Limited by pain and fatigue. SpO2 down to 86% on RA while walking; DOE 2/4. SpO2 returning to 97% on RA with seated rest (MD/RN notified). Pt declining any follow-up PT services. Plans to d/c to sister's home. Wheelchair and cushion already delivered to room; pt instructed on set-up and use.     Follow Up Recommendations  No PT follow up;Supervision for mobility/OOB     Equipment Recommendations  Wheelchair (measurements PT)(w/c delivered to room already)    Recommendations for Other Services       Precautions / Restrictions Precautions Precautions: Fall Restrictions Weight Bearing Restrictions: No    Mobility  Bed Mobility Overal bed mobility: Modified Independent                Transfers Overall transfer level: Modified independent Equipment used: None;Rolling walker (2 wheeled) Transfers: Sit to/from [employer] transfer comment: Mod indep to perform stand pivot from bed to wheelchair with UE support on hand rail. Mod indep standing with RW  Ambulation/Gait Ambulation/Gait assistance: Supervision Gait Distance (Feet): 40 Feet Assistive  device: Rolling walker (2 wheeled) Gait Pattern/deviations: Step-to pattern;Antalgic;Trunk flexed Gait velocity: Decreased Gait velocity interpretation: <1.8 ft/sec, indicate of risk for recurrent falls General Gait Details: Amb 20' x2 with RW, seated rest in between secondary to pain. Supervision for balance/safety. SpO2 reading 86% with ambulation on RA (unsure if reliable pleth), pt DOE 2/4; SpO2 97% upon seated rest with reliable pleth (MD notified)   Stairs             Wheelchair Mobility    Modified Rankin (Stroke Patients Only)       Balance Overall balance assessment: Needs assistance   Sitting balance-Leahy Scale: Good       Standing balance-Leahy Scale: Fair                              Cognition Arousal/Alertness: Awake/alert Behavior During Therapy: Agitated Overall Cognitive Status: No family/caregiver present to determine baseline cognitive functioning                                 General Comments: More agreeable to participate. Still with poor attention and difficulty reasoning      Exercises      General Comments        Pertinent Vitals/Pain Pain Assessment: Faces Faces Pain Scale: Hurts little more Pain Location: "where doesn't hurt" Pain Descriptors / Indicators: Constant Pain Intervention(s): Monitored during session    Home Living                      Prior  Function            PT Goals (current goals can now be found in the care plan section) Acute Rehab PT Goals Patient Stated Goal: Now planning to d/c back to sister's home PT Goal Formulation: With patient Time For Goal Achievement: 09/03/18 Potential to Achieve Goals: Good Progress towards PT goals: Progressing toward goals    Frequency    Min 3X/week      PT Plan Discharge plan needs to be updated    Co-evaluation              AM-PAC PT "6 Clicks" Daily Activity  Outcome Measure  Difficulty turning over in bed  (including adjusting bedclothes, sheets and blankets)?: None Difficulty moving from lying on back to sitting on the side of the bed? : None Difficulty sitting down on and standing up from a chair with arms (e.g., wheelchair, bedside commode, etc,.)?: None Help needed moving to and from a bed to chair (including a wheelchair)?: None Help needed walking in hospital room?: A Little Help needed climbing 3-5 steps with a railing? : A Little 6 Click Score: 22    End of Session   Activity Tolerance: Patient limited by fatigue;Patient limited by pain Patient left: with call bell/phone within reach(seated in wheelchair) Nurse Communication: Mobility status PT Visit Diagnosis: Other abnormalities of gait and mobility (R26.89);Muscle weakness (generalized) (M62.81)     Time: 4975-3005 PT Time Calculation (min) (ACUTE ONLY): 15 min  Charges:  $Gait Training: 8-22 mins                    Mabeline Caras, PT, DPT Acute Rehabilitation Services  Pager [phone] Office [place] 08/20/2018, 5:06 PM

## 2018-08-20 NOTE — Progress Notes (Signed)
CM talked to patient again, stated that he plans to stay with his sister at discharge and is requesting a wheelchair; Wheelchair ordered as requested through [place] and to be delivered to the room prior to discharging home; CM talked to [facility], pt does not need home oxygen at this time; CM will continue to follow for progression of care. Mindi Slicker [facility] [phone]

## 2018-08-20 NOTE — Progress Notes (Signed)
   08/20/18 0057  Vitals  Temp (!) 97.4 F (36.3 C)  Temp Source Oral  BP 119/79  MAP (mmHg) 91  BP Location Right Arm  BP Method Automatic  Patient Position (if appropriate) Lying  Pulse Rate 80  Resp 20  Oxygen Therapy  SpO2 96 %  O2 Device Room Air  Height and Weight  Height 5\' 6"  (1.676 m)  Weight 69.6 kg  Type of Scale Used Standing (Scale B)  Type of Weight Actual  BSA (Calculated - sq m) 1.8 sq meters  BMI (Calculated) 24.79  Weight in (lb) to have BMI = 25 154.6  Admitted Pt to rm 3E28 from ED, pt alert and oriented, placed on bed comfortably, oriented to room, call bell placed within reach. Placed on cardiac monitor, CCMD made aware.

## 2018-08-20 NOTE — Progress Notes (Signed)
SATURATION QUALIFICATIONS: (This note is used to comply with regulatory documentation for home oxygen)  Patient Saturations on Room Air at Rest = 97%  Patient Saturations on Room Air while Ambulating = 86%  Please briefly explain why patient needs home oxygen: Pt with SpO2 dropping < 90% on RA with ambulation.  Mabeline Caras, PT, DPT Acute Rehabilitation Services  Pager [phone] Office [phone]

## 2018-08-20 NOTE — Progress Notes (Signed)
1701-ED CM paged for home DME needs.

## 2018-08-20 NOTE — Progress Notes (Signed)
Pt refusing to put on telemetry. Educated patient and he continues to refuse.

## 2018-08-20 NOTE — Progress Notes (Signed)
Patient refused to ambulate even for a short distance only.

## 2018-08-20 NOTE — Progress Notes (Signed)
[place] # [id number] is already discharge, left msg for pt's. Need for home DME,

## 2018-08-20 NOTE — Care Management Note (Signed)
Case Management Note  Patient Details  Name: Robert [name] MRN: [id number] Date of Birth: [birth year]  Subjective/Objective:     Acute on Chronic Resp Failure              Action/Plan: Noted admitted 3 times in 6 months Patient was recently at a [place] but left AMA; CM talked to patient at the bedside, plans to stay with his sister vs staying at his grandmothers home; CM will continue to follow for progression of care.  Expected Discharge Date:   possibly 08/21/2018               Expected Discharge Plan:   SNF vs home/ sister's home  Discharge planning Services  CM Consult  Status of Service:   In progress  Sherrilyn Rist [phone] 08/20/2018, 11:17 AM

## 2018-08-20 NOTE — Progress Notes (Signed)
As he has hypoxia with ambulation, without home O2 arranged, patient is not cleared for dc from medical standpoint. DC order removed.    M. Cruzita Lederer, MD, PhD [facility] Pager [phone]  If 7PM-7AM, please contact night-coverage [email or web address] Password [email or web address]

## 2018-08-21 DIAGNOSIS — E034 Atrophy of thyroid (acquired): Secondary | ICD-10-CM | POA: Diagnosis not present

## 2018-08-21 DIAGNOSIS — J9621 Acute and chronic respiratory failure with hypoxia: Secondary | ICD-10-CM | POA: Diagnosis not present

## 2018-08-21 DIAGNOSIS — K7460 Unspecified cirrhosis of liver: Secondary | ICD-10-CM

## 2018-08-21 DIAGNOSIS — J449 Chronic obstructive pulmonary disease, unspecified: Secondary | ICD-10-CM

## 2018-08-21 NOTE — Discharge Summary (Signed)
Physician Discharge Summary  Robert [name] UXL:[id number] DOB: 1961-01-27 DOA: 08/19/2018  PCP: Patient, No Pcp Per  Admit date: 08/19/2018 Discharge date: 08/21/2018  Admitted From: Home Disposition: Home  Recommendations for Outpatient Follow-up:  1. Follow up with PCP in 1-2 weeks  Home Health: PT Equipment/Devices: Wheelchair  Discharge Condition: Stable CODE STATUS: Full code Diet recommendation: Heart healthy  HPI: Per Dr. Lucila Maine Ignace Mandigo is a 57 y.o. male with history of COPD on home oxygen, hypothyroidism, chronic pain, peptic ulcer disease who was admitted last month for acute respiratory failure with sepsis secondary to pneumonia and COPD exacerbation during which patient also was found to have a GI bleed and had undergone EGD and colonoscopy which were largely unremarkable eventually discharged to rehab but patient signed out AMA few days ago from the rehab and had been living with his sister and was found to be hypoxic.  Patient usually has home oxygen but since patient left AMA was not having any at home.  Since patient was hypoxic patient was referred to the ER.  Hospital Course: Chronic hypoxic respiratory failure -decompensated due to lack of oxygen at home, also per report patient took narcotics which further depressed his respiratory drive.  Care management was consulted and patient was arranged with home oxygen.  Strongly counseled against further use of narcotics.  Patient is refusing SNF and chooses to go home instead. Chronic pain -counseled against heavy use of narcotics as this can further worsen his respiratory status History of peptic ulcer disease with a recent EGD that was unremarkable-continue PPI Hypothyroidism-continue Synthroid Chronic anemia-stable History of cirrhosis per chart-presently appears compensated COPD-stable, no wheezing Tobacco abuse, in remission-quit 9 months ago  Discharge Diagnoses:  Principal Problem:  Acute and chronic respiratory failure with hypoxia ([place]) Active Problems:   COPD (chronic obstructive pulmonary disease) ([place])   Liver cirrhosis (H[facility])   Chronic pain syndrome   Duodenal ulcer   Hypothyroidism due to acquired atrophy of thyroid   Discharge Instructions  Allergies as of 08/21/2018      Reactions   Ciprofloxacin Hives   Penicillins Itching   Has patient had a PCN reaction causing immediate rash, facial/tongue/throat swelling, SOB or lightheadedness with hypotension: YES Has patient had a PCN reaction causing severe rash involving mucus membranes or skin necrosis: NO Has patient had a PCN reaction that required hospitalization NO Has patient had a PCN reaction occurring within the last 10 years: NO If all of the above answers are "NO", then may proceed with Cephalosporin use.   Adhesive  [tape] Rash   Morphine And Related Itching, Rash      Medication List    STOP taking these medications   Oxycodone HCl 10 MG Tabs     TAKE these medications   albuterol 108 (90 Base) MCG/ACT inhaler Commonly known as:  PROVENTIL HFA;VENTOLIN HFA Inhale 2 puffs into the lungs every 6 (six) hours as needed for wheezing or shortness of breath.   COMBIVENT RESPIMAT 20-100 MCG/ACT Aers respimat Generic drug:  Ipratropium-Albuterol Inhale 1 puff into the lungs every 6 (six) hours. Notes to patient:  See directions   diclofenac sodium 1 % Gel Commonly known as:  VOLTAREN Apply 2 g topically 4 (four) times daily.   DULoxetine 60 MG capsule Commonly known as:  CYMBALTA Take 1 capsule (60 mg total) by mouth 2 (two) times daily.   famotidine 20 MG tablet Commonly known as:  PEPCID Take 20 mg by mouth daily. Starting 08/27/18 Start  taking on:  08/27/2018   ferrous sulfate 325 (65 FE) MG tablet Take 325 mg by mouth daily with breakfast.   gabapentin 300 MG capsule Commonly known as:  NEURONTIN Take 600 mg by mouth 3 (three) times daily.   levothyroxine 50 MCG  tablet Commonly known as:  SYNTHROID, LEVOTHROID Take 1 tablet (50 mcg total) by mouth daily before breakfast.   lidocaine 5 % Commonly known as:  LIDODERM Place 1 patch onto the skin daily. Remove & Discard patch within 12 hours or as directed by MD   multivitamin tablet Take 1 tablet by mouth daily.   nicotine 14 mg/24hr patch Commonly known as:  NICODERM CQ - dosed in mg/24 hours Place 1 patch (14 mg total) onto the skin daily.   NUTRITIONAL SUPPLEMENT PO NAS (No Added Salt) diet - Regular texture Notes to patient:  See directions   PROMOD Liqd Give 30 cc by mouth three times daily   OXYGEN Place 2 L/min into the nose continuous. Notes to patient:  See directions   pantoprazole 40 MG tablet Commonly known as:  PROTONIX Take 40 mg by mouth daily.   polyethylene glycol packet Commonly known as:  MIRALAX / GLYCOLAX Take 17 g by mouth 2 (two) times daily.   promethazine 25 MG tablet Commonly known as:  PHENERGAN Take 25 mg by mouth as directed. 90 minutes before suboxone. Notes to patient:  See directions   rivaroxaban 20 MG Tabs tablet Commonly known as:  XARELTO Take 20 mg by mouth daily with supper.   senna-docusate 8.6-50 MG tablet Commonly known as:  Senokot-S Take 1 tablet by mouth 2 (two) times daily.   SUBOXONE 2-0.5 MG Film Generic drug:  Buprenorphine HCl-Naloxone HCl Place 1 Film under the tongue See admin instructions. Place 1 strip sublingual twice daily one day 1 then 1 strip three times daily on day 2, then 2 strips twice daily until rechecked. Notes to patient:  See directions   tamsulosin 0.4 MG Caps capsule Commonly known as:  FLOMAX Take 0.4 mg by mouth 2 (two) times daily.   tiotropium 18 MCG inhalation capsule Commonly known as:  SPIRIVA Place 18 mcg into inhaler and inhale daily.   vitamin B-12 500 MCG tablet Commonly known as:  CYANOCOBALAMIN Take 500 mcg by mouth daily.            Durable Medical Equipment  (From admission,  onward)         Start     Ordered   08/20/18 1658  For home use only DME oxygen  Once    Question Answer Comment  Mode or (Route) Nasal cannula   Liters per Minute 2   Frequency Continuous (stationary and portable oxygen unit needed)   Oxygen delivery system Gas      08/20/18 1657   08/20/18 1459  For home use only DME lightweight manual wheelchair with seat cushion  Once    Comments:  Patient suffers from Chronic Resp Failure which impairs their ability to perform daily activities like walking in the home.  A wheelchair will not resolve  issue with performing activities of daily living. A wheelchair will allow patient to safely perform daily activities. Patient is not able to propel themselves in the home using a standard weight wheelchair due to weakness}. Patient can self propel in the lightweight wheelchair.  Accessories: elevating leg rests (ELRs), wheel locks, extensions and anti-tippers.   08/20/18 1500           Consultations:  None   Procedures/Studies:  Dg Chest 2 View  Result Date: 08/19/2018 CLINICAL DATA:  Shortness of breath.  Chest pain. EXAM: CHEST - 2 VIEW COMPARISON:  05/31/2018 FINDINGS: Previous bilateral lung densities shown in August have resolved. Cardiac and mediastinal margins appear normal. The lungs appear clear. Atherosclerotic calcification of the brachiocephalic artery accounts for some of the density overlying the right medial clavicle. IMPRESSION: 1.  No active cardiopulmonary disease is radiographically apparent. Electronically Signed   By: Van Clines M.D.   On: 08/19/2018 11:54   Ct Angio Chest Pe W/cm &/or Wo Cm  Result Date: 08/19/2018 CLINICAL DATA:  Hypoxia EXAM: CT ANGIOGRAPHY CHEST WITH CONTRAST TECHNIQUE: Multidetector CT imaging of the chest was performed using the standard protocol during bolus administration of intravenous contrast. Multiplanar CT image reconstructions and MIPs were obtained to evaluate the vascular anatomy.  CONTRAST:  64mL ISOVUE-370 IOPAMIDOL (ISOVUE-370) INJECTION 76% COMPARISON:  Chest CT October 02, 2016; chest radiograph August 19, 2018 FINDINGS: Cardiovascular: There is no demonstrable pulmonary embolus. There is no thoracic aortic aneurysm or dissection. Visualized great vessels appear unremarkable. No pericardial effusion or pericardial thickening evident. There are foci of aortic atherosclerosis and coronary artery calcification. Mediastinum/Nodes: Thyroid appears normal. There is a pretracheal lymph node measuring 1.3 x 1.3 cm. No other lymph node prominence evident. No esophageal lesions are appreciable. Lungs/Pleura: There is underlying centrilobular emphysematous change. There is atelectatic change in both lower lobe regions. There is no edema or consolidation. There is benign-appearing pleural thickening laterally on the right at the level of the aortic arch with fat within the area of pleural thickening. No evident pleural effusion. Upper Abdomen: There is aortic atherosclerosis. Visualized upper abdominal structures otherwise appear unremarkable. Musculoskeletal: There are no blastic or lytic bone lesions. No chest wall lesions are apparent. Review of the MIP images confirms the above findings. IMPRESSION: 1. No demonstrable pulmonary embolus. No thoracic aortic aneurysm or dissection. There are foci of aortic atherosclerosis as well as foci of coronary artery calcification. 2. Underlying centrilobular emphysematous change. Bibasilar atelectasis. No lung edema or consolidation. Benign pleural thickening on the right laterally at the aortic arch level. There is fat within this pleural thickening, consistent with benign etiology. This finding was present on prior CT examination. 3. Slightly enlarged pretracheal lymph node of uncertain etiology. No other adenopathy evident. Aortic Atherosclerosis (ICD10-I70.0) and Emphysema (ICD10-J43.9). Electronically Signed   By: Lowella Grip III M.D.   On:  08/19/2018 14:52     Subjective: - no chest pain, shortness of breath, no abdominal pain, nausea or vomiting.   Discharge Exam: Vitals:   08/21/18 1215 08/21/18 1611  BP: (!) 134/94 (!) 146/88  Pulse: 89 89  Resp: 18 17  Temp: 98.1 F (36.7 C) 98.2 F (36.8 C)  SpO2: 96% 98%    General: Pt is alert, awake, not in acute distress Cardiovascular: RRR, S1/S2 +, no rubs, no gallops Respiratory: CTA bilaterally, no wheezing, no rhonchi    The results of significant diagnostics from this hospitalization (including imaging, microbiology, ancillary and laboratory) are listed below for reference.     Microbiology: No results found for this or any previous visit (from the past 240 hour(s)).   Labs: BNP (last 3 results) No results for input(s): BNP in the last 8760 hours. Basic Metabolic Panel: Recent Labs  Lab 08/19/18 1055 08/20/18 0432  NA 136 140  K 4.1 3.5  CL 104 108  CO2 19* 24  GLUCOSE 91 110*  BUN 17 11  CREATININE 1.27* 1.10  CALCIUM 8.8* 8.6*   Liver Function Tests: No results for input(s): AST, ALT, ALKPHOS, BILITOT, PROT, ALBUMIN in the last 168 hours. No results for input(s): LIPASE, AMYLASE in the last 168 hours. Recent Labs  Lab 08/19/18 1940  AMMONIA 24   CBC: Recent Labs  Lab 08/19/18 1055 08/20/18 0432  WBC 9.1 6.3  NEUTROABS 6.1  --   HGB 9.3* 9.6*  HCT 30.2* 30.7*  MCV 85.8 84.3  PLT 302 327   Cardiac Enzymes: No results for input(s): CKTOTAL, CKMB, CKMBINDEX, TROPONINI in the last 168 hours. BNP: Invalid input(s): POCBNP CBG: Recent Labs  Lab 08/19/18 1627  GLUCAP 72   D-Dimer No results for input(s): DDIMER in the last 72 hours. Hgb A1c No results for input(s): HGBA1C in the last 72 hours. Lipid Profile No results for input(s): CHOL, HDL, LDLCALC, TRIG, CHOLHDL, LDLDIRECT in the last 72 hours. Thyroid function studies No results for input(s): TSH, T4TOTAL, T3FREE, THYROIDAB in the last 72 hours.  Invalid input(s):  FREET3 Anemia work up No results for input(s): VITAMINB12, FOLATE, FERRITIN, TIBC, IRON, RETICCTPCT in the last 72 hours. Urinalysis    Component Value Date/Time   COLORURINE YELLOW 08/19/2018 [place] 08/19/2018 1055   LABSPEC 1.016 08/19/2018 1055   PHURINE 5.0 08/19/2018 1055   GLUCOSEU NEGATIVE 08/19/2018 1055   HGBUR NEGATIVE 08/19/2018 [place] 08/19/2018 1055   KETONESUR 20 (A) 08/19/2018 1055   PROTEINUR NEGATIVE 08/19/2018 1055   UROBILINOGEN 0.2 05/27/2014 1652   NITRITE NEGATIVE 08/19/2018 1055   LEUKOCYTESUR NEGATIVE 08/19/2018 1055   Sepsis Labs Invalid input(s): PROCALCITONIN,  WBC,  LACTICIDVEN   Time coordinating discharge: 20 minutes  SIGNED:  Marzetta Board, MD  [facility] 08/21/2018, 4:13 PM Pager [phone]  If 7PM-7AM, please contact night-coverage [email or web address] Password [email or web address]

## 2018-08-21 NOTE — Progress Notes (Addendum)
SATURATION QUALIFICATIONS: (This note is used to comply with regulatory documentation for home oxygen)  Patient Saturations on Room Air at Rest = 100%  Patient Saturations on Room Air while Ambulating = 88%  Patient Saturations on 2 Liters of oxygen while Ambulating = 98%  Please briefly explain why patient needs home oxygen: pt desats and has dyspnea without oxygen when ambulating long distances

## 2018-08-21 NOTE — Progress Notes (Addendum)
Patient need home oxygen and HHPT; Dan with [facility] called for arrangements as requested; a portable oxygen tank is to be delivered to the room today prior to discharging home and more oxygen tanks will be delivered to his sisters home after discharge. Aneta Mins [phone]  22:25 am- Received call from [facility] with [place], only 2 of the required 3 saturations documented; sats to be re-documented to order home oxygen. Geradine RN made aware; Mindi Slicker RN,MHA,BSN

## 2018-08-26 ENCOUNTER — Ambulatory Visit (INDEPENDENT_AMBULATORY_CARE_PROVIDER_SITE_OTHER): Payer: Medicaid Other | Admitting: Family

## 2018-08-26 ENCOUNTER — Encounter: Payer: Self-pay | Admitting: Family

## 2018-08-26 VITALS — BP 144/101 | HR 108 | Temp 98.0°F | Wt 156.0 lb

## 2018-08-26 DIAGNOSIS — B182 Chronic viral hepatitis C: Secondary | ICD-10-CM

## 2018-08-26 NOTE — Progress Notes (Signed)
Subjective:    Patient ID: Robert [name], male    DOB: 1961-03-09, 57 y.o.   MRN: [id number]  Chief Complaint  Patient presents with  . Hepatitis C    HPI:  Robert [name] is a 57 y.o. male who presents today for initial office visit for evaluation and treatment of Hepatitis C.  Robert [name] was initially diagnosed with Hepatitis C about 30 years ago with risk factors including IVDU, tattoos, and possibly blood transfusion prior to 1992. Denies sharing of razors/toothbrushes or sexual contact with a positive partner. He has never been treated. Previously informed that he may have cirrhosis but has received mixed results from differing providers. Most recent ultrasound reviewed without evidence of cirrhois. He has no current symptoms. No family or personal history of liver disease. Blood work completed in August 2019 with a viral load of 2.2 million. Previous history of cocaine and heroin usage as well as tobacco. 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  He is interested in pursuing treatment.   Allergies  Allergen Reactions  . Ciprofloxacin Hives  . Penicillins Itching    Has patient had a PCN reaction causing immediate rash, facial/tongue/throat swelling, SOB or lightheadedness with hypotension: YES Has patient had a PCN reaction causing severe rash involving mucus membranes or skin necrosis: NO Has patient had a PCN reaction that required hospitalization NO Has patient had a PCN reaction occurring within the last 10 years: NO If all of the above answers are "NO", then may proceed with Cephalosporin use.  . Adhesive  [Tape] Rash  . Morphine And Related Itching and Rash      Outpatient Medications Prior to Visit  Medication Sig Dispense Refill  . albuterol (PROVENTIL HFA;VENTOLIN HFA) 108 (90 Base) MCG/ACT inhaler Inhale 2 puffs into the lungs every 6 (six) hours as needed for wheezing or shortness of breath.    . diclofenac sodium  (VOLTAREN) 1 % GEL Apply 2 g topically 4 (four) times daily.    . DULoxetine (CYMBALTA) 60 MG capsule Take 1 capsule (60 mg total) by mouth 2 (two) times daily. 15 capsule 0  . famotidine (PEPCID) 20 MG tablet Take 20 mg by mouth daily. Starting 08/27/18    . ferrous sulfate 325 (65 FE) MG tablet Take 325 mg by mouth daily with breakfast.    . gabapentin (NEURONTIN) 300 MG capsule Take 600 mg by mouth 3 (three) times daily.     . Ipratropium-Albuterol (COMBIVENT RESPIMAT) 20-100 MCG/ACT AERS respimat Inhale 1 puff into the lungs every 6 (six) hours.    Marland Kitchen levothyroxine (SYNTHROID, LEVOTHROID) 50 MCG tablet Take 1 tablet (50 mcg total) by mouth daily before breakfast. 7 tablet 0  . lidocaine (LIDODERM) 5 % Place 1 patch onto the skin daily. Remove & Discard patch within 12 hours or as directed by MD 30 patch 0  . Multiple Vitamin (MULTIVITAMIN) tablet Take 1 tablet by mouth daily.    . nicotine (NICODERM CQ - DOSED IN MG/24 HOURS) 14 mg/24hr patch Place 1 patch (14 mg total) onto the skin daily. 28 patch 0  . Nutritional Supplements (NUTRITIONAL SUPPLEMENT PO) NAS (No Added Salt) diet - Regular texture    . Nutritional Supplements (PROMOD) LIQD Give 30 cc by mouth three times daily    . OXYGEN Place 2 L/min into the nose continuous.    . pantoprazole (PROTONIX) 40 MG tablet Take 40 mg by mouth daily.     Marland Kitchen  polyethylene glycol (MIRALAX / GLYCOLAX) packet Take 17 g by mouth 2 (two) times daily. 14 each 0  . promethazine (PHENERGAN) 25 MG tablet Take 25 mg by mouth as directed. 90 minutes before suboxone.  0  . rivaroxaban (XARELTO) 20 MG TABS tablet Take 20 mg by mouth daily with supper.    . senna-docusate (SENOKOT-S) 8.6-50 MG tablet Take 1 tablet by mouth 2 (two) times daily.    . SUBOXONE 2-0.5 MG FILM Place 1 Film under the tongue See admin instructions. Place 1 strip sublingual twice daily one day 1 then 1 strip three times daily on day 2, then 2 strips twice daily until rechecked.  0  .  tamsulosin (FLOMAX) 0.4 MG CAPS capsule Take 0.4 mg by mouth 2 (two) times daily.    Marland Kitchen tiotropium (SPIRIVA) 18 MCG inhalation capsule Place 18 mcg into inhaler and inhale daily.    . vitamin B-12 (CYANOCOBALAMIN) 500 MCG tablet Take 500 mcg by mouth daily.      No facility-administered medications prior to visit.      Past Medical History:  Diagnosis Date  . Abnormal posture   . Acute and chronic respiratory failure with hypoxia ([place])   . Anxiety   . Arthritis    "I'm eat up w/it"  . Asthma   . B12 deficiency    "give myself shots"  . C. difficile colitis 2011  . Cerebral infarction ([place])   . Chronic liver failure ([place])   . Chronic lower back pain   . Chronic pain syndrome   . Chronic viral hepatitis C ([place])   . Cirrhosis ([place])   . COPD (chronic obstructive pulmonary disease) ([place])   . Deep vein thrombosis ([facility])    "several"  . Depression   . Gastrointestinal hemorrhage    unspecified   . GERD (gastroesophageal reflux disease)   . Hepatitis C 2011   genotype 1a.  never treated.    Marland Kitchen History of blood transfusion 2013   "related to kidneys shutting down"  . Hypertension   . Hypothyroidism   . Intermittent self-catheterization of bladder   . Liver cancer ([place])   . Metastatic lung cancer    "left"  . Migraines    "2-3/day" (05/27/2014)  . Muscle weakness   . Myocardial infarction ([place]) 2007; ~ 2011  . Other abnormalities of gait and mobility   . Oxygen dependent    3L; 24/7" (05/27/2014)  . Partial small bowel obstruction ([place])   . Personal history of transient ischemic attack (TIA), and cerebral infarction without residual deficits   . Pneumonia    "several times"  . Pulmonary emboli (H[facility])    "several"  . Shortness of breath    "all the time"  . Stroke [facility]) ~ 03/2012   "couldn't use my left hand for ~ 6 months" (05/27/2014)  . Stroke [facility]) 05/25/2014   "not able to use my left hand again" (05/27/2014)  . Unspecified asthma with (acute) exacerbation       Past  Surgical History:  Procedure Laterality Date  . BIOPSY  05/29/2018   Procedure: BIOPSY;  Surgeon: Lavena Bullion, DO;  Location: [facility] END[facility]COPY;  Service: Gastroenterology;;  . COLONOSCOPY WITH PROPOFOL N/A 07/15/2018   Procedure: COLONOSCOPY WITH PROPOFOL;  Surgeon: Lavena Bullion, DO;  Location: [facility] ENDOSCOPY;  Service: Gastroenterology;  Laterality: N/A;  . ESOPHAGOGASTRODUODENOSCOPY (EGD) WITH PROPOFOL N/A 05/29/2018   Procedure: ESOPHAGOGASTRODUODENOSCOPY (EGD) WITH PROPOFOL;  Surgeon: Lavena Bullion, DO;  Location: [place];  Service: Gastroenterology;  Laterality: N/A;  . ESOPHAGOGASTRODUODENOSCOPY (EGD) WITH PROPOFOL N/A 07/15/2018   Procedure: ESOPHAGOGASTRODUODENOSCOPY (EGD) WITH PROPOFOL;  Surgeon: Lavena Bullion, DO;  Location: [facility] ENDOSCOPY;  Service: Gastroenterology;  Laterality: N/A;  . INCISION AND DRAINAGE ABSCESS Left 05/28/2014   Procedure: INCISION AND DRAINAGE ABSCESS LEFT WRIST;  Surgeon: Leanora Cover, MD;  Location: [place];  Service: Orthopedics;  Laterality: Left;  . KNEE SURGERY Left ~ 1972   "cut top of my kneecap off"  . MULTIPLE TOOTH EXTRACTIONS  11/2010   "26"  . NECK SURGERY     x4   . POLYPECTOMY  07/15/2018   Procedure: POLYPECTOMY;  Surgeon: Lavena Bullion, DO;  Location: [facility] ENDOSCOPY;  Service: Gastroenterology;;  . TONSILLECTOMY AND ADENOIDECTOMY  1974  . TOTAL HIP ARTHROPLASTY Right 03/2018  . TRANSURETHRAL RESECTION OF PROSTATE  2012; 2014      Family History  Problem Relation Age of Onset  . Breast cancer Mother   . Cancer Paternal Aunt   . Cancer Maternal Aunt        think it is breast      Social History   Socioeconomic History  . Marital status: Married    Spouse name: Not on file  . Number of children: 3  . Years of education: Not on file  . Highest education level: Not on file  Occupational History  . Not on file  Social Needs  . Financial resource strain: Not on file  . Food insecurity:    Worry: Not on file     Inability: Not on file  . Transportation needs:    Medical: Not on file    Non-medical: Not on file  Tobacco Use  . Smoking status: Former Smoker    Packs/day: 0.50    Years: 41.00    Pack years: 20.50    Types: Cigarettes  . Smokeless tobacco: Former Systems developer  . Tobacco comment: quit in February 2019  Substance and Sexual Activity  . Alcohol use: Not Currently    Comment: 8 drinks a week  . Drug use: Not Currently    Types: Cocaine, Heroin  . Sexual activity: Not on file  Lifestyle  . Physical activity:    Days per week: Not on file    Minutes per session: Not on file  . Stress: Not on file  Relationships  . Social connections:    Talks on phone: Not on file    Gets together: Not on file    Attends religious service: Not on file    Active member of club or organization: Not on file    Attends meetings of clubs or organizations: Not on file    Relationship status: Not on file  . Intimate partner violence:    Fear of current or ex partner: Not on file    Emotionally abused: Not on file    Physically abused: Not on file    Forced sexual activity: Not on file  Other Topics Concern  . Not on file  Social History Narrative  . Not on file      Review of Systems  Constitutional: Negative for chills, diaphoresis, fatigue and fever.  Respiratory: Negative for cough, chest tightness, shortness of breath and wheezing.   Cardiovascular: Negative for chest pain.  Gastrointestinal: Negative for abdominal distention, abdominal pain, constipation, diarrhea, nausea and vomiting.  Neurological: Negative for weakness and headaches.  Hematological: Does not bruise/bleed easily.       Objective:    BP Marland Kitchen)  144/101   Pulse (!) 108   Temp 98 F (36.7 C)   Wt 156 lb (70.8 kg)   SpO2 98%   BMI 25.18 kg/m  Nursing note and vital signs reviewed.  Physical Exam  Constitutional: He is oriented to person, place, and time. He appears well-developed. No distress.  Cardiovascular:  Normal rate, regular rhythm, normal heart sounds and intact distal pulses. Exam reveals no gallop and no friction rub.  No murmur heard. Pulmonary/Chest: Effort normal and breath sounds normal. No respiratory distress. He has no wheezes. He has no rales. He exhibits no tenderness.  Abdominal: Soft. Bowel sounds are normal. He exhibits no distension, no ascites and no mass. There is no hepatosplenomegaly. There is no tenderness. There is no rebound and no guarding.  Neurological: He is alert and oriented to person, place, and time.  Skin: Skin is warm and dry.  Psychiatric: He has a normal mood and affect. His behavior is normal. Judgment and thought content normal.        Assessment & Plan:   Problem List Items Addressed This Visit      Digestive   Hepatitis C - Primary    Mr. Hupp has chronic Hepatitis C with a viral load of 2.2 million and likely obtained from history of IV drug usage. He has no current symptoms. We discussed the evaluation, transmission/prevention, risks of left untreated, and treatment options. He would like to proceed with treatment. Will check fibrosure, genotype, CMP, and Hepatitis B status. Will plan for treatment with Mavyret or Epclusa pending lab work results.       Relevant Orders   Hepatitis C genotype   Liver Fibrosis, FibroTest-ActiTest   Comprehensive metabolic panel (Completed)   Hepatitis B surface antigen   Hepatitis B surface antibody,qualitative       I am having Cortney Mckinney. Libman maintain his DULoxetine, levothyroxine, gabapentin, diclofenac sodium, Ipratropium-Albuterol, vitamin B-12, tiotropium, nicotine, polyethylene glycol, senna-docusate, lidocaine, Nutritional Supplements (NUTRITIONAL SUPPLEMENT PO), ferrous sulfate, OXYGEN, albuterol, PROMOD, multivitamin, famotidine, pantoprazole, tamsulosin, rivaroxaban, SUBOXONE, and promethazine.   Follow-up: Return if symptoms worsen or fail to improve.    Terri Piedra, MSN, FNP-C Nurse  Practitioner [facility] Office phone: [phone] Pager: [place] number: [phone]

## 2018-08-26 NOTE — Patient Instructions (Signed)
Nice to meet you.  We will check your blood work today and let you know if additional imaging is needed.  Limit acetaminophen (Tylenol) usage to no more than 2 grams (2,000 mg) per day.  Avoid alcohol.  Do not share toothbrushes or razors.  Practice safe sex to protect against transmission as well as sexually transmitted disease.    Hepatitis C Hepatitis C is a viral infection of the liver. It can lead to scarring of the liver (cirrhosis), liver failure, or liver cancer. Hepatitis C may go undetected for months or years because people with the infection may not have symptoms, or they may have only mild symptoms. What are the causes? This condition is caused by the hepatitis C virus (HCV). The virus can spread from person to person (is contagious) through:  Blood.  Childbirth. A woman who has hepatitis C can pass it to her baby during birth.  Bodily fluids, such as breast milk, tears, semen, vaginal fluids, and saliva.  Blood transfusions or organ transplants done in the [place] before 1992.  What increases the risk? The following factors may make you more likely to develop this condition:  Having contact with unclean (contaminated) needles or syringes. This may result from: ? Acupuncture. ? Tattoing. ? Body piercing. ? Injecting drugs.  Having unprotected sex with someone who is infected.  Needing treatment to filter your blood (kidney dialysis).  Having HIV (human immunodeficiency virus) or AIDS (acquired immunodeficiency syndrome).  Working in a job that involves contact with blood or bodily fluids, such as health care.  What are the signs or symptoms? Symptoms of this condition include:  Fatigue.  Loss of appetite.  Nausea.  Vomiting.  Abdominal pain.  Dark yellow urine.  Yellowish skin and eyes (jaundice).  Itchy skin.  Clay-colored bowel movements.  Joint pain.  Bleeding and bruising easily.  Fluid building up in your stomach  (ascites).  In some cases, you may not have any symptoms. How is this diagnosed? This condition is diagnosed with:  Blood tests.  Other tests to check how well your liver is functioning. They may include: ? Magnetic resonance elastography (MRE). This imaging test uses MRIs and sound waves to measure liver stiffness. ? Transient elastography. This imaging test uses ultrasounds to measure liver stiffness. ? Liver biopsy. This test requires taking a small tissue sample from your liver to examine it under a microscope.  How is this treated? Your health care provider may perform noninvasive tests or a liver biopsy to help decide the best course of treatment. Treatment may include:  Antiviral medicines and other medicines.  Follow-up treatments every 6-12 months for infections or other liver conditions.  Receiving a donated liver (liver transplant).  Follow these instructions at home: Medicines  Take over-the-counter and prescription medicines only as told by your health care provider.  Take your antiviral medicine as told by your health care provider. Do not stop taking the antiviral even if you start to feel better.  Do not take any medicines unless approved by your health care provider, including over-the-counter medicines and birth control pills. Activity  Rest as needed.  Do not have sex unless approved by your health care provider.  Ask your health care provider when you may return to school or work. Eating and drinking  Eat a balanced diet with plenty of fruits and vegetables, whole grains, and lowfat (lean) meats or non-meat proteins (such as beans or tofu).  Drink enough fluids to keep your urine clear or  pale yellow.  Do not drink alcohol. General instructions  Do not share toothbrushes, nail clippers, or razors.  Wash your hands frequently with soap and water. If soap and water are not available, use hand sanitizer.  Cover any cuts or open sores on your skin to  prevent spreading the virus.  Keep all follow-up visits as told by your health care provider. This is important. You may need follow-up visits every 6-12 months. How is this prevented? There is no vaccine for hepatitis C. The only way to prevent the disease is to reduce the risk of exposure to the virus. Make sure you:  Wash your hands frequently with soap and water. If soap and water are not available, use hand sanitizer.  Do not share needles or syringes.  Practice safe sex and use condoms.  Avoid handling blood or bodily fluids without gloves or other protection.  Avoid getting tattoos or piercings in shops or other locations that are not clean.  Contact a health care provider if:  You have a fever.  You develop abdominal pain.  You pass dark urine.  You pass clay-colored stools.  You develop joint pain. Get help right away if:  You have increasing fatigue or weakness.  You lose your appetite.  You cannot eat or drink without vomiting.  You develop jaundice or your jaundice gets worse.  You bruise or bleed easily. Summary  Hepatitis C is a viral infection of the liver. It can lead to scarring of the liver (cirrhosis), liver failure, or liver cancer.  The hepatitis C virus (HCV) causes this condition. The virus can pass from person to person (is contagious).  You should not take any medicines unless approved by your health care provider. This includes over-the-counter medicines and birth control pills. This information is not intended to replace advice given to you by your health care provider. Make sure you discuss any questions you have with your health care provider. Document Released: 09/14/2000 Document Revised: 10/23/2016 Document Reviewed: 10/23/2016 Elsevier Interactive Patient Education  [employer].

## 2018-08-27 ENCOUNTER — Encounter: Payer: Self-pay | Admitting: Family

## 2018-08-27 NOTE — Assessment & Plan Note (Signed)
Mr. Teschner has chronic Hepatitis C with a viral load of 2.2 million and likely obtained from history of IV drug usage. He has no current symptoms. We discussed the evaluation, transmission/prevention, risks of left untreated, and treatment options. He would like to proceed with treatment. Will check fibrosure, genotype, CMP, and Hepatitis B status. Will plan for treatment with Mavyret or Epclusa pending lab work results.

## 2018-08-29 LAB — LIVER FIBROSIS, FIBROTEST-ACTITEST
ALPHA-2-MACROGLOBULIN: 411 mg/dL — AB (ref 106–279)
ALT: 36 U/L (ref 9–46)
APOLIPOPROTEIN A1: 118 mg/dL (ref 94–176)
BILIRUBIN: 0.3 mg/dL (ref 0.2–1.2)
FIBROSIS SCORE: 0.5
GGT: 33 U/L (ref 3–85)
HAPTOGLOBIN: 238 mg/dL — AB (ref 43–212)
NECROINFLAMMAT ACT SCORE: 0.24
REFERENCE ID: 2747186

## 2018-08-29 LAB — COMPREHENSIVE METABOLIC PANEL
AG Ratio: 1.5 (calc) (ref 1.0–2.5)
ALBUMIN MSPROF: 4.4 g/dL (ref 3.6–5.1)
ALT: 37 U/L (ref 9–46)
AST: 35 U/L (ref 10–35)
Alkaline phosphatase (APISO): 76 U/L (ref 40–115)
BUN: 11 mg/dL (ref 7–25)
CHLORIDE: 101 mmol/L (ref 98–110)
CO2: 26 mmol/L (ref 20–32)
CREATININE: 1.07 mg/dL (ref 0.70–1.33)
Calcium: 9.8 mg/dL (ref 8.6–10.3)
GLOBULIN: 2.9 g/dL (ref 1.9–3.7)
Glucose, Bld: 85 mg/dL (ref 65–99)
POTASSIUM: 4.6 mmol/L (ref 3.5–5.3)
SODIUM: 139 mmol/L (ref 135–146)
TOTAL PROTEIN: 7.3 g/dL (ref 6.1–8.1)
Total Bilirubin: 0.3 mg/dL (ref 0.2–1.2)

## 2018-08-29 LAB — HEPATITIS B SURFACE ANTIGEN: Hepatitis B Surface Ag: NONREACTIVE

## 2018-08-29 LAB — HEPATITIS C GENOTYPE

## 2018-08-29 LAB — HEPATITIS B SURFACE ANTIBODY,QUALITATIVE: Hep B S Ab: REACTIVE — AB

## 2018-09-01 ENCOUNTER — Other Ambulatory Visit: Payer: Self-pay | Admitting: Family

## 2018-09-01 DIAGNOSIS — B182 Chronic viral hepatitis C: Secondary | ICD-10-CM

## 2018-09-01 MED ORDER — GLECAPREVIR-PIBRENTASVIR 100-40 MG PO TABS: 3.0000 | ORAL_TABLET | Freq: Every day | ORAL | 1 refills | Status: DC

## 2018-09-05 ENCOUNTER — Other Ambulatory Visit: Payer: Self-pay | Admitting: Family Medicine

## 2018-09-19 ENCOUNTER — Encounter: Payer: Self-pay | Admitting: Gastroenterology

## 2018-10-17 ENCOUNTER — Telehealth: Payer: Self-pay | Admitting: *Deleted

## 2018-10-17 NOTE — Telephone Encounter (Signed)
Patient's sister Ginger is calling for results, plan to move forward with treatment of Hep C.  RN relayed results per result note from 11/26, but they have not heard anything yet about medication. Will forward to pharmacy for follow up. Landis Gandy, RN

## 2018-10-17 NOTE — Telephone Encounter (Signed)
Robert [name] tried to call her back in December.. Medicaid denied twice because she needs to be in a treatment program before they will approve.

## 2018-10-17 NOTE — Telephone Encounter (Signed)
8 weeks of Mavyret was sent to the pharmacy on 12/2.

## 2018-10-17 NOTE — Telephone Encounter (Signed)
Message left on voicemail: Ginger calling regarding patient's medication.  She does not know why he has not started.   It seems there has been discussion regarding this matter but it is still unclear of th answer.   I will forward to involved parties.

## 2018-10-17 NOTE — Telephone Encounter (Signed)
I'm not sure.Marland Kitchen i'll defer this question to [place].

## 2018-10-17 NOTE — Telephone Encounter (Signed)
Do we refer to treatment programs or does he need to find one on his own? Does [facility] count?

## 2018-10-20 NOTE — Telephone Encounter (Signed)
RN updated Ginger that insurance denied medication twice, that he will need to be in treatment/counseling for substance abuse before we can resubmit it. Ginger will schedule him an appointment with Rollene Fare here to get this started.

## 2018-11-05 ENCOUNTER — Ambulatory Visit: Payer: Medicaid Other | Admitting: Licensed Clinical Social Worker

## 2018-11-13 ENCOUNTER — Ambulatory Visit: Payer: Medicaid Other | Admitting: Licensed Clinical Social Worker

## 2018-12-10 ENCOUNTER — Other Ambulatory Visit: Payer: Self-pay

## 2018-12-10 ENCOUNTER — Ambulatory Visit (INDEPENDENT_AMBULATORY_CARE_PROVIDER_SITE_OTHER): Payer: Medicaid Other | Admitting: Licensed Clinical Social Worker

## 2018-12-10 DIAGNOSIS — F1011 Alcohol abuse, in remission: Secondary | ICD-10-CM | POA: Diagnosis not present

## 2018-12-10 DIAGNOSIS — F411 Generalized anxiety disorder: Secondary | ICD-10-CM | POA: Diagnosis not present
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
## 2018-12-11 ENCOUNTER — Other Ambulatory Visit: Payer: Self-pay | Admitting: Pharmacist

## 2018-12-11 DIAGNOSIS — B182 Chronic viral hepatitis C: Secondary | ICD-10-CM

## 2018-12-11 MED ORDER — GLECAPREVIR-PIBRENTASVIR 100-40 MG PO TABS: 3.0000 | ORAL_TABLET | Freq: Every day | ORAL | 1 refills | Status: AC

## 2018-12-15 ENCOUNTER — Telehealth: Payer: Self-pay | Admitting: Pharmacy Technician

## 2018-12-15 NOTE — Telephone Encounter (Signed)
[facility] Patient Advocate Encounter  Prior Authorization for Mavyret has been approved.    PA# [id number] Effective dates: 12/15/2018 through 03/16/2019  Patients co-pay is $3.   [facility] will continue to follow.  Robert [name]. Robert [name] Patient [facility] Phone: [phone] Fax:  [phone]

## 2018-12-19 ENCOUNTER — Telehealth: Payer: Self-pay | Admitting: Pharmacist

## 2018-12-19 ENCOUNTER — Encounter: Payer: Self-pay | Admitting: Pharmacy Technician

## 2018-12-19 MED FILL — MAVYRET 100-40 MG TABS: 100-40 | 28 days supply | Qty: 84 | Fill #0

## 2018-12-19 NOTE — Telephone Encounter (Signed)
Great, thank you!

## 2018-12-19 NOTE — Telephone Encounter (Signed)
Patient is approved to receive Mavyret x 8 weeks for chronic Hepatitis C infection. Counseled patient to take all three tablets of Mavyret daily with food.  Counseled patient the need to take all three tablets together and to not separate them out during the day. Encouraged patient not to miss any doses and explained how their chance of cure could go down with each dose missed. Counseled patient on what to do if dose is missed - if it is closer to the missed dose take immediately; if closer to next dose then skip dose and take the next dose at the usual time.   Counseled patient on common side effects such as headache, fatigue, and nausea and that these normally decrease with time. I reviewed patient medications and found a drug interaction with his Xarelto. Will monitor closely (not contraindicated). Discussed with patient that there are several drug interactions with Mavyret and instructed patient to call the clinic if he wishes to start a new medication during course of therapy. Also advised patient to call if he experiences any side effects. Patient will follow-up with me in the pharmacy clinic on 4/21.

## 2019-01-14 MED FILL — MAVYRET 100-40 MG TABS: 100-40 | 28 days supply | Qty: 84 | Fill #1

## 2019-01-20 ENCOUNTER — Other Ambulatory Visit: Payer: Self-pay

## 2019-01-20 ENCOUNTER — Ambulatory Visit (INDEPENDENT_AMBULATORY_CARE_PROVIDER_SITE_OTHER): Payer: Medicaid Other | Admitting: Pharmacist

## 2019-01-20 DIAGNOSIS — B182 Chronic viral hepatitis C: Secondary | ICD-10-CM

## 2019-01-20 NOTE — Progress Notes (Addendum)
Virtual Visit via Telephone Note  I connected with Robert [name] on 01/20/2019 at 11:30 AM EDT by telephone and verified that I am speaking with the correct person using two identifiers.   I discussed the limitations, risks, security and privacy concerns of performing an evaluation and management service by telephone and the availability of in person appointments. I also discussed with the patient that there may be a patient responsible charge related to this service. The patient expressed understanding and agreed to proceed.   HPI: Robert [name] is a 58 y.o. male who presents for Hepatitis C follow-up.  Medication: Mavyret x 8 weeks  Start Date: 12/23/2018  Hepatitis C Genotype: 1a  Fibrosis Score: F2  Hepatitis C RNA: 2.2 million on 05/29/18  Patient Active Problem List   Diagnosis Date Noted  . Acute and chronic respiratory failure with hypoxia ([place]) 08/19/2018  . Special screening for malignant neoplasms, colon   . Multiple polyps of sigmoid colon   . Hypothyroidism due to acquired atrophy of thyroid 06/24/2018  . Current every day smoker 06/24/2018  . Constipation due to opioid therapy 06/24/2018  . Orthostasis 06/04/2018  . Multifocal pneumonia   . Acute urinary retention   . Acute on chronic respiratory failure with hypoxia ([place]) 06/01/2018  . HCAP (healthcare-associated pneumonia) 05/31/2018  . Sepsis ([place]) 05/31/2018  . Tachycardia 05/31/2018  . Fever 05/31/2018  . Duodenal ulcer   . Chronic pain syndrome   . Acute GI bleeding 05/28/2018  . Hypotension   . Normocytic anemia   . Liver cirrhosis ([place])   . Chronically on opiate therapy 05/27/2014  . Chronic anticoagulation 05/27/2014  . COPD (chronic obstructive pulmonary disease) ([place]) 04/17/2012  . Low back pain 04/17/2012  . Hepatitis C 04/17/2012  . Adynamic ileus ([place]) 04/17/2012  . Liver cancer ([place])   . Lung cancer ([place])   . Pulmonary emboli (HCC)     Patient's Medications  New  Prescriptions   No medications on file  Previous Medications   ALBUTEROL (PROVENTIL HFA;VENTOLIN HFA) 108 (90 BASE) MCG/ACT INHALER    Inhale 2 puffs into the lungs every 6 (six) hours as needed for wheezing or shortness of breath.   DICLOFENAC SODIUM (VOLTAREN) 1 % GEL    Apply 2 g topically 4 (four) times daily.   DULOXETINE (CYMBALTA) 60 MG CAPSULE    Take 1 capsule (60 mg total) by mouth 2 (two) times daily.   FAMOTIDINE (PEPCID) 20 MG TABLET    Take 20 mg by mouth daily. Starting 08/27/18   FERROUS SULFATE 325 (65 FE) MG TABLET    Take 325 mg by mouth daily with breakfast.   GABAPENTIN (NEURONTIN) 300 MG CAPSULE    Take 600 mg by mouth 3 (three) times daily.    GLECAPREVIR-PIBRENTASVIR (MAVYRET) 100-40 MG TABS    Take 3 tablets by mouth daily with lunch.   IPRATROPIUM-ALBUTEROL (COMBIVENT RESPIMAT) 20-100 MCG/ACT AERS RESPIMAT    Inhale 1 puff into the lungs every 6 (six) hours.   LEVOTHYROXINE (SYNTHROID, LEVOTHROID) 50 MCG TABLET    Take 1 tablet (50 mcg total) by mouth daily before breakfast.   LIDOCAINE (LIDODERM) 5 %    Place 1 patch onto the skin daily. Remove & Discard patch within 12 hours or as directed by MD   MULTIPLE VITAMIN (MULTIVITAMIN) TABLET    Take 1 tablet by mouth daily.   NICOTINE (NICODERM CQ - DOSED IN MG/24 HOURS) 14 MG/24HR PATCH    Place 1 patch (  14 mg total) onto the skin daily.   NUTRITIONAL SUPPLEMENTS (NUTRITIONAL SUPPLEMENT PO)    NAS (No Added Salt) diet - Regular texture   NUTRITIONAL SUPPLEMENTS (PROMOD) LIQD    Give 30 cc by mouth three times daily   OXYGEN    Place 2 L/min into the nose continuous.   POLYETHYLENE GLYCOL (MIRALAX / GLYCOLAX) PACKET    Take 17 g by mouth 2 (two) times daily.   PROMETHAZINE (PHENERGAN) 25 MG TABLET    Take 25 mg by mouth as directed. 90 minutes before suboxone.   RIVAROXABAN (XARELTO) 20 MG TABS TABLET    Take 20 mg by mouth daily with supper.   SENNA-DOCUSATE (SENOKOT-S) 8.6-50 MG TABLET    Take 1 tablet by mouth 2 (two)  times daily.   SUBOXONE 2-0.5 MG FILM    Place 1 Film under the tongue See admin instructions. Place 1 strip sublingual twice daily one day 1 then 1 strip three times daily on day 2, then 2 strips twice daily until rechecked.   TAMSULOSIN (FLOMAX) 0.4 MG CAPS CAPSULE    Take 0.4 mg by mouth 2 (two) times daily.   TIOTROPIUM (SPIRIVA) 18 MCG INHALATION CAPSULE    Place 18 mcg into inhaler and inhale daily.   VITAMIN B-12 (CYANOCOBALAMIN) 500 MCG TABLET    Take 500 mcg by mouth daily.   Modified Medications   No medications on file  Discontinued Medications   No medications on file    Allergies: Allergies  Allergen Reactions  . Ciprofloxacin Hives  . Penicillins Itching    Has patient had a PCN reaction causing immediate rash, facial/tongue/throat swelling, SOB or lightheadedness with hypotension: YES Has patient had a PCN reaction causing severe rash involving mucus membranes or skin necrosis: NO Has patient had a PCN reaction that required hospitalization NO Has patient had a PCN reaction occurring within the last 10 years: NO If all of the above answers are "NO", then may proceed with Cephalosporin use.  . Adhesive  [Tape] Rash  . Morphine And Related Itching and Rash    Past Medical History: Past Medical History:  Diagnosis Date  . Abnormal posture   . Acute and chronic respiratory failure with hypoxia ([place])   . Anxiety   . Arthritis    "I'm eat up w/it"  . Asthma   . B12 deficiency    "give myself shots"  . C. difficile colitis 2011  . Cerebral infarction ([place])   . Chronic liver failure ([place])   . Chronic lower back pain   . Chronic pain syndrome   . Chronic viral hepatitis C ([place])   . Cirrhosis ([place])   . COPD (chronic obstructive pulmonary disease) ([place])   . Deep vein thrombosis (HCC)    "several"  . Depression   . Gastrointestinal hemorrhage    unspecified   . GERD (gastroesophageal reflux disease)   . Hepatitis C 2011   genotype 1a.  never treated.    Marland Kitchen  History of blood transfusion 2013   "related to kidneys shutting down"  . Hypertension   . Hypothyroidism   . Intermittent self-catheterization of bladder   . Liver cancer ([place])   . Metastatic lung cancer    "left"  . Migraines    "2-3/day" (05/27/2014)  . Muscle weakness   . Myocardial infarction ([place]) 2007; ~ 2011  . Other abnormalities of gait and mobility   . Oxygen dependent    3L; 24/7" (05/27/2014)  . Partial small bowel  obstruction ([place])   . Personal history of transient ischemic attack (TIA), and cerebral infarction without residual deficits   . Pneumonia    "several times"  . Pulmonary emboli (HCC)    "several"  . Shortness of breath    "all the time"  . Stroke [facility]) ~ 03/2012   "couldn't use my left hand for ~ 6 months" (05/27/2014)  . Stroke [facility]) 05/25/2014   "not able to use my left hand again" (05/27/2014)  . Unspecified asthma with (acute) exacerbation     Social History: Social History   Socioeconomic History  . Marital status: Married    Spouse name: Not on file  . Number of children: 3  . Years of education: Not on file  . Highest education level: Not on file  Occupational History  . Not on file  Social Needs  . Financial resource strain: Not on file  . Food insecurity:    Worry: Not on file    Inability: Not on file  . Transportation needs:    Medical: Not on file    Non-medical: Not on file  Tobacco Use  . Smoking status: Former Smoker    Packs/day: 0.50    Years: 41.00    Pack years: 20.50    Types: Cigarettes  . Smokeless tobacco: Former Systems developer  . Tobacco comment: quit in February 2019  Substance and Sexual Activity  . Alcohol use: Not Currently    Comment: 8 drinks a week  . Drug use: Not Currently    Types: Cocaine, Heroin  . Sexual activity: Not on file  Lifestyle  . Physical activity:    Days per week: Not on file    Minutes per session: Not on file  . Stress: Not on file  Relationships  . Social connections:    Talks on phone:  Not on file    Gets together: Not on file    Attends religious service: Not on file    Active member of club or organization: Not on file    Attends meetings of clubs or organizations: Not on file    Relationship status: Not on file  Other Topics Concern  . Not on file  Social History Narrative  . Not on file    Labs: Hepatitis C Lab Results  Component Value Date   HCVGENOTYPE 1a 08/26/2018   FIBROSTAGE F2 08/26/2018   Hepatitis B Lab Results  Component Value Date   HEPBSAB REACTIVE (A) 08/26/2018   HEPBSAG NON-REACTIVE 08/26/2018   Hepatitis A No results found for: HAV HIV Lab Results  Component Value Date   HIV Non Reactive 06/01/2018   HIV Non Reactive 05/29/2018   Lab Results  Component Value Date   CREATININE 1.07 08/26/2018   CREATININE 1.10 08/20/2018   CREATININE 1.27 (H) 08/19/2018   CREATININE 0.96 06/07/2018   CREATININE 0.87 06/06/2018   Lab Results  Component Value Date   AST 35 08/26/2018   AST 24 06/01/2018   AST 39 05/31/2018   ALT 36 08/26/2018   ALT 37 08/26/2018   ALT 20 06/01/2018   INR 1.1 (H) 06/25/2018   INR 1.76 05/28/2018   INR 1.01 02/22/2015    Assessment: I spoke with Robert [name] over the phone and did an evisit for this encounter.  He started taking 8 weeks of Mavyret around one month ago in March.  He states that he is doing quite well on the medication and has no complaints. He has already received his 2nd and final  month from [facility] and began taking it today.  He takes all three pills together after eating cereal every morning.  He has missed no doses and reports no nausea, vomiting, or diarrhea.  He is a little fatigued but states that he is always tired. No issues to report today.  We would normally check labs today but will defer until he has completed therapy and will make him an appointment for in a month. He agrees with plan.  Plan: - Continue Mavyret x 8 weeks - F/u with me 5/28 at 1130am  I  discussed the assessment and treatment plan with the patient. The patient was provided an opportunity to ask questions and all were answered. The patient agreed with the plan and demonstrated an understanding of the instructions.   The patient was advised to call back or seek an in-person evaluation if the symptoms worsen or if the condition fails to improve as anticipated.  I provided 8 minutes of non-face-to-face time during this encounter.  Cassie L. Kuppelweiser, PharmD, BCIDP, AAHIVP, [facility] 01/21/2019, 9:36 AM

## 2019-02-26 ENCOUNTER — Ambulatory Visit: Payer: Medicaid Other | Admitting: Pharmacist

## 2019-03-11 ENCOUNTER — Ambulatory Visit: Payer: Medicaid Other | Admitting: Pharmacist

## 2019-08-04 ENCOUNTER — Encounter: Payer: Self-pay | Admitting: Gastroenterology

## 2020-11-16 ENCOUNTER — Encounter (HOSPITAL_COMMUNITY): Payer: Self-pay | Admitting: Emergency Medicine

## 2020-11-16 ENCOUNTER — Emergency Department (HOSPITAL_COMMUNITY)
Admission: EM | Admit: 2020-11-16 | Discharge: 2020-11-16 | Disposition: A | Discharge status: Home / Self Care | Payer: Medicaid Other | Attending: Emergency Medicine | Admitting: Emergency Medicine

## 2020-11-16 ENCOUNTER — Other Ambulatory Visit: Payer: Self-pay

## 2020-11-16 DIAGNOSIS — Z760 Encounter for issue of repeat prescription: Secondary | ICD-10-CM | POA: Insufficient documentation

## 2020-11-16 DIAGNOSIS — Z96641 Presence of right artificial hip joint: Secondary | ICD-10-CM | POA: Insufficient documentation

## 2020-11-16 DIAGNOSIS — Z79899 Other long term (current) drug therapy: Secondary | ICD-10-CM | POA: Diagnosis not present

## 2020-11-16 DIAGNOSIS — Z8505 Personal history of malignant neoplasm of liver: Secondary | ICD-10-CM | POA: Insufficient documentation

## 2020-11-16 DIAGNOSIS — I10 Essential (primary) hypertension: Secondary | ICD-10-CM | POA: Diagnosis not present

## 2020-11-16 DIAGNOSIS — Z8673 Personal history of transient ischemic attack (TIA), and cerebral infarction without residual deficits: Secondary | ICD-10-CM | POA: Diagnosis not present

## 2020-11-16 DIAGNOSIS — Z87891 Personal history of nicotine dependence: Secondary | ICD-10-CM | POA: Diagnosis not present

## 2020-11-16 DIAGNOSIS — E039 Hypothyroidism, unspecified: Secondary | ICD-10-CM | POA: Insufficient documentation

## 2020-11-16 DIAGNOSIS — J449 Chronic obstructive pulmonary disease, unspecified: Secondary | ICD-10-CM | POA: Insufficient documentation

## 2020-11-16 MED ORDER — OXYCODONE-ACETAMINOPHEN 5-325 MG PO TABS
1.0000 | ORAL_TABLET | Freq: Once | ORAL | Status: AC
  Administered 2020-11-16: 1 via ORAL
  Filled 2020-11-16: qty 1

## 2020-11-16 MED ORDER — OXYCODONE-ACETAMINOPHEN 10-325 MG PO TABS: 1.0000 | ORAL_TABLET | Freq: Three times a day (TID) | ORAL | 0 refills | Status: AC | PRN

## 2020-11-16 NOTE — ED Triage Notes (Incomplete)
Pt arrives from home via [facility] with chronic neck pain from an old injury, he ran out of pain medication 2 week ago.   156/98 70 96% 8/10 pain level

## 2020-11-16 NOTE — Discharge Instructions (Addendum)
Please return for any problem.  Follow-up with your regular care provider as instructed.  You are receiving a refill on your chronic pain medication today.  Further refills from the emergency department would not be appropriate.  It is important to establish care with a chronic pain provider.

## 2020-11-16 NOTE — ED Provider Notes (Signed)
[facility] EMERGENCY DEPARTMENT Provider Note   CSN: [id number] Arrival date & time: 11/16/20  1345     History Chief Complaint  Patient presents with  . Neck Pain    Robert [name] is a 60 y.o. male.  60 year old male with prior medical history as detailed below presents for evaluation.  Patient reports that he is "in between" pain doctors.  Patient reports that he is out of his chronic pain medication.  He requests refill on Percocet.  Otherwise, he is without acute complaint.  The history is provided by the patient and medical records.  Medication Refill Medications/supplies requested:  Percocet Reason for request:  Medications ran out Medications taken before: yes - see home medications   Patient has complete original prescription information: yes        Past Medical History:  Diagnosis Date  . Abnormal posture   . Acute and chronic respiratory failure with hypoxia ([place])   . Anxiety   . Arthritis    "I'm eat up w/it"  . Asthma   . B12 deficiency    "give myself shots"  . C. difficile colitis 2011  . Cerebral infarction ([place])   . Chronic liver failure ([place])   . Chronic lower back pain   . Chronic pain syndrome   . Chronic viral hepatitis C ([place])   . Cirrhosis ([place])   . COPD (chronic obstructive pulmonary disease) ([place])   . Deep vein thrombosis (HCC)    "several"  . Depression   . Gastrointestinal hemorrhage    unspecified   . GERD (gastroesophageal reflux disease)   . Hepatitis C 2011   genotype 1a.  never treated.    Marland Kitchen History of blood transfusion 2013   "related to kidneys shutting down"  . Hypertension   . Hypothyroidism   . Intermittent self-catheterization of bladder   . Liver cancer ([place])   . Metastatic lung cancer    "left"  . Migraines    "2-3/day" (05/27/2014)  . Muscle weakness   . Myocardial infarction ([place]) 2007; ~ 2011  . Other abnormalities of gait and mobility   . Oxygen dependent    3L; 24/7" (05/27/2014)   . Partial small bowel obstruction ([place])   . Personal history of transient ischemic attack (TIA), and cerebral infarction without residual deficits   . Pneumonia    "several times"  . Pulmonary emboli (HCC)    "several"  . Shortness of breath    "all the time"  . Stroke [facility]) ~ 03/2012   "couldn't use my left hand for ~ 6 months" (05/27/2014)  . Stroke [facility]) 05/25/2014   "not able to use my left hand again" (05/27/2014)  . Unspecified asthma with (acute) exacerbation     Patient Active Problem List   Diagnosis Date Noted  . Acute and chronic respiratory failure with hypoxia ([place]) 08/19/2018  . Special screening for malignant neoplasms, colon   . Multiple polyps of sigmoid colon   . Hypothyroidism due to acquired atrophy of thyroid 06/24/2018  . Current every day smoker 06/24/2018  . Constipation due to opioid therapy 06/24/2018  . Orthostasis 06/04/2018  . Multifocal pneumonia   . Acute urinary retention   . Acute on chronic respiratory failure with hypoxia ([place]) 06/01/2018  . HCAP (healthcare-associated pneumonia) 05/31/2018  . Sepsis ([place]) 05/31/2018  . Tachycardia 05/31/2018  . Fever 05/31/2018  . Duodenal ulcer   . Chronic pain syndrome   . Acute GI bleeding 05/28/2018  . Hypotension   .  Normocytic anemia   . Liver cirrhosis ([place])   . Chronically on opiate therapy 05/27/2014  . Chronic anticoagulation 05/27/2014  . COPD (chronic obstructive pulmonary disease) ([place]) 04/17/2012  . Low back pain 04/17/2012  . Hepatitis C 04/17/2012  . Adynamic ileus ([place]) 04/17/2012  . Liver cancer ([place])   . Lung cancer ([place])   . Pulmonary emboli [facility])     Past Surgical History:  Procedure Laterality Date  . BIOPSY  05/29/2018   Procedure: BIOPSY;  Surgeon: Lavena Bullion, DO;  Location: [facility];  Service: Gastroenterology;;  . COLONOSCOPY WITH PROPOFOL N/A 07/15/2018   Procedure: COLONOSCOPY WITH PROPOFOL;  Surgeon: Lavena Bullion, DO;  Location: [facility];  Service:  Gastroenterology;  Laterality: N/A;  . ESOPHAGOGASTRODUODENOSCOPY (EGD) WITH PROPOFOL N/A 05/29/2018   Procedure: ESOPHAGOGASTRODUODENOSCOPY (EGD) WITH PROPOFOL;  Surgeon: Lavena Bullion, DO;  Location: [facility];  Service: Gastroenterology;  Laterality: N/A;  . ESOPHAGOGASTRODUODENOSCOPY (EGD) WITH PROPOFOL N/A 07/15/2018   Procedure: ESOPHAGOGASTRODUODENOSCOPY (EGD) WITH PROPOFOL;  Surgeon: Lavena Bullion, DO;  Location: [facility];  Service: Gastroenterology;  Laterality: N/A;  . INCISION AND DRAINAGE ABSCESS Left 05/28/2014   Procedure: INCISION AND DRAINAGE ABSCESS LEFT WRIST;  Surgeon: Leanora Cover, MD;  Location: [facility];  Service: Orthopedics;  Laterality: Left;  . KNEE SURGERY Left ~ 1972   "cut top of my kneecap off"  . MULTIPLE TOOTH EXTRACTIONS  11/2010   "26"  . NECK SURGERY     x4   . POLYPECTOMY  07/15/2018   Procedure: POLYPECTOMY;  Surgeon: Lavena Bullion, DO;  Location: [facility];  Service: Gastroenterology;;  . TONSILLECTOMY AND ADENOIDECTOMY  1974  . TOTAL HIP ARTHROPLASTY Right 03/2018  . TRANSURETHRAL RESECTION OF PROSTATE  2012; 2014       Family History  Problem Relation Age of Onset  . Breast cancer Mother   . Cancer Paternal Aunt   . Cancer Maternal Aunt        think it is breast    Social History   Tobacco Use  . Smoking status: Former Smoker    Packs/day: 0.50    Years: 41.00    Pack years: 20.50    Types: Cigarettes  . Smokeless tobacco: Former Systems developer  . Tobacco comment: quit in February 2019  Vaping Use  . Vaping Use: Never used  Substance Use Topics  . Alcohol use: Not Currently    Comment: 8 drinks a week  . Drug use: Not Currently    Types: Cocaine, Heroin    Home Medications Prior to Admission medications   Medication Sig Start Date End Date Taking? Authorizing Provider  oxyCODONE-acetaminophen (PERCOCET) 10-325 MG tablet Take 1 tablet by mouth every 8 (eight) hours as needed for up to 4 days for pain. 11/16/20 11/20/20  Yes MessickWallis Bamberg, MD  albuterol (PROVENTIL HFA;VENTOLIN HFA) 108 (90 Base) MCG/ACT inhaler Inhale 2 puffs into the lungs every 6 (six) hours as needed for wheezing or shortness of breath. 06/08/18   [provider]  diclofenac sodium (VOLTAREN) 1 % GEL Apply 2 g topically 4 (four) times daily.    [provider]  DULoxetine (CYMBALTA) 60 MG capsule Take 1 capsule (60 mg total) by mouth 2 (two) times daily. 06/07/14   Veryl Speak, MD  famotidine (PEPCID) 20 MG tablet Take 20 mg by mouth daily. Starting 08/27/18 08/27/18   [provider]  ferrous sulfate 325 (65 FE) MG tablet Take 325 mg by mouth daily with breakfast. 06/08/18  [provider]  gabapentin (NEURONTIN) 300 MG capsule Take 600 mg by mouth 3 (three) times daily.  06/24/18   [provider]  Glecaprevir-Pibrentasvir (MAVYRET) 100-40 MG TABS Take 3 tablets by mouth daily with lunch. 12/11/18   Kuppelweiser, Cassie L, RPH-CPP  Ipratropium-Albuterol (COMBIVENT RESPIMAT) 20-100 MCG/ACT AERS respimat Inhale 1 puff into the lungs every 6 (six) hours.    [provider]  levothyroxine (SYNTHROID, LEVOTHROID) 50 MCG tablet Take 1 tablet (50 mcg total) by mouth daily before breakfast. 06/07/14   Veryl Speak, MD  lidocaine (LIDODERM) 5 % Place 1 patch onto the skin daily. Remove & Discard patch within 12 hours or as directed by MD 06/07/18   Eugenie Filler, MD  Multiple Vitamin (MULTIVITAMIN) tablet Take 1 tablet by mouth daily. 06/24/18   [provider]  nicotine (NICODERM CQ - DOSED IN MG/24 HOURS) 14 mg/24hr patch Place 1 patch (14 mg total) onto the skin daily. 06/08/18   Eugenie Filler, MD  Nutritional Supplements (NUTRITIONAL SUPPLEMENT PO) NAS (No Added Salt) diet - Regular texture    [provider]  Nutritional Supplements (PROMOD) LIQD Give 30 cc by mouth three times daily 06/23/18   [provider]  OXYGEN Place 2 L/min into the nose continuous. 06/11/18    [provider]  polyethylene glycol (MIRALAX / GLYCOLAX) packet Take 17 g by mouth 2 (two) times daily. 06/07/18   Eugenie Filler, MD  promethazine (PHENERGAN) 25 MG tablet Take 25 mg by mouth as directed. 90 minutes before suboxone. 08/16/18   [provider]  rivaroxaban (XARELTO) 20 MG TABS tablet Take 20 mg by mouth daily with supper.    [provider]  senna-docusate (SENOKOT-S) 8.6-50 MG tablet Take 1 tablet by mouth 2 (two) times daily. 06/07/18   Eugenie Filler, MD  SUBOXONE 2-0.5 MG FILM Place 1 Film under the tongue See admin instructions. Place 1 strip sublingual twice daily one day 1 then 1 strip three times daily on day 2, then 2 strips twice daily until rechecked. 08/16/18   [provider]  tamsulosin (FLOMAX) 0.4 MG CAPS capsule Take 0.4 mg by mouth 2 (two) times daily.    [provider]  tiotropium (SPIRIVA) 18 MCG inhalation capsule Place 18 mcg into inhaler and inhale daily.    [provider]  vitamin B-12 (CYANOCOBALAMIN) 500 MCG tablet Take 500 mcg by mouth daily.     [provider]    Allergies    Ciprofloxacin, Penicillins, Adhesive  [tape], and Morphine and related  Review of Systems   Review of Systems  All other systems reviewed and are negative.   Physical Exam Updated Vital Signs BP (!) 157/99 (BP Location: Right Arm)   Pulse 77   Temp 98.1 F (36.7 C) (Oral)   Resp 16   Ht 5\' 7"  (1.702 m)   Wt 74.4 kg   SpO2 100%   BMI 25.69 kg/m   Physical Exam Vitals and nursing note reviewed.  Constitutional:      General: He is not in acute distress.    Appearance: Normal appearance. He is well-developed and well-nourished.  HENT:     Head: Normocephalic and atraumatic.     Mouth/Throat:     Mouth: Oropharynx is clear and moist.  Eyes:     Extraocular Movements: EOM normal.     Conjunctiva/sclera: Conjunctivae normal.     Pupils: Pupils are equal, round, and reactive to light.   Cardiovascular:  Rate and Rhythm: Normal rate and regular rhythm.     Heart sounds: Normal heart sounds.  Pulmonary:     Effort: Pulmonary effort is normal. No respiratory distress.     Breath sounds: Normal breath sounds.  Abdominal:     General: There is no distension.     Palpations: Abdomen is soft.     Tenderness: There is no abdominal tenderness.  Musculoskeletal:        General: No deformity or edema. Normal range of motion.     Cervical back: Normal range of motion and neck supple.  Skin:    General: Skin is warm and dry.  Neurological:     Mental Status: He is alert and oriented to person, place, and time.  Psychiatric:        Mood and Affect: Mood and affect normal.     ED Results / Procedures / Treatments   Labs (all labs ordered are listed, but only abnormal results are displayed) Labs Reviewed - No data to display  EKG None  Radiology No results found.  Procedures Procedures   Medications Ordered in ED Medications - No data to display  ED Course  I have reviewed the triage vital signs and the nursing notes.  Pertinent labs & imaging results that were available during my care of the patient were reviewed by me and considered in my medical decision making (see chart for details).    MDM Rules/Calculators/A&P                          MDM  Screen complete  Atwell Mcdanel was evaluated in Emergency Department on 11/16/2020 for the symptoms described in the history of present illness. He was evaluated in the context of the global COVID-19 pandemic, which necessitated consideration that the patient might be at risk for infection with the SARS-CoV-2 virus that causes COVID-19. Institutional protocols and algorithms that pertain to the evaluation of patients at risk for COVID-19 are in a state of rapid change based on information released by regulatory bodies including the CDC and federal and state organizations. These policies and algorithms were  followed during the patient's care in the ED.  Patient is presenting with request for refill on his chronic pain meds.  Patient reports that he is in between his pain medication providers.  Patient is without acute complaint.  Patient is advised that additional prescriptions from the ED would be inappropriate.  Importance of establishing care for his chronic pain is stressed.  Patient will be prescribed a very small amount of Percocet to be used for the next several days until he can establish follow-up.  Final Clinical Impression(s) / ED Diagnoses Final diagnoses:  Medication refill    Rx / DC Orders ED Discharge Orders         Ordered    oxyCODONE-acetaminophen (PERCOCET) 10-325 MG tablet  Every 8 hours PRN        11/16/20 1514           Valarie Merino, MD 11/16/20 1520
